# Patient Record
Sex: Male | Born: 1945 | Race: Black or African American | Hispanic: No | Smoking: Former smoker
Health system: Southern US, Community
[De-identification: ages and names within clinical notes are randomized; demographics above are authoritative.]

## PROBLEM LIST (undated history)

## (undated) DIAGNOSIS — R0602 Shortness of breath: Secondary | ICD-10-CM

## (undated) DIAGNOSIS — N200 Calculus of kidney: Secondary | ICD-10-CM

## (undated) DIAGNOSIS — M1990 Unspecified osteoarthritis, unspecified site: Secondary | ICD-10-CM

## (undated) DIAGNOSIS — R7302 Impaired glucose tolerance (oral): Secondary | ICD-10-CM

## (undated) DIAGNOSIS — Z9981 Dependence on supplemental oxygen: Secondary | ICD-10-CM

## (undated) DIAGNOSIS — I071 Rheumatic tricuspid insufficiency: Secondary | ICD-10-CM

## (undated) DIAGNOSIS — I272 Pulmonary hypertension, unspecified: Secondary | ICD-10-CM

## (undated) DIAGNOSIS — R609 Edema, unspecified: Secondary | ICD-10-CM

## (undated) DIAGNOSIS — E785 Hyperlipidemia, unspecified: Secondary | ICD-10-CM

## (undated) DIAGNOSIS — R200 Anesthesia of skin: Secondary | ICD-10-CM

## (undated) DIAGNOSIS — I10 Essential (primary) hypertension: Secondary | ICD-10-CM

## (undated) DIAGNOSIS — J9621 Acute and chronic respiratory failure with hypoxia: Secondary | ICD-10-CM

## (undated) DIAGNOSIS — E876 Hypokalemia: Secondary | ICD-10-CM

## (undated) DIAGNOSIS — J8410 Pulmonary fibrosis, unspecified: Secondary | ICD-10-CM

## (undated) DIAGNOSIS — R600 Localized edema: Secondary | ICD-10-CM

## (undated) DIAGNOSIS — N184 Chronic kidney disease, stage 4 (severe): Secondary | ICD-10-CM

## (undated) DIAGNOSIS — G4700 Insomnia, unspecified: Secondary | ICD-10-CM

## (undated) DIAGNOSIS — W3400XA Accidental discharge from unspecified firearms or gun, initial encounter: Secondary | ICD-10-CM

## (undated) DIAGNOSIS — J449 Chronic obstructive pulmonary disease, unspecified: Secondary | ICD-10-CM

## (undated) DIAGNOSIS — I509 Heart failure, unspecified: Secondary | ICD-10-CM

## (undated) DIAGNOSIS — I872 Venous insufficiency (chronic) (peripheral): Secondary | ICD-10-CM

## (undated) DIAGNOSIS — R011 Cardiac murmur, unspecified: Secondary | ICD-10-CM

## (undated) DIAGNOSIS — C3490 Malignant neoplasm of unspecified part of unspecified bronchus or lung: Secondary | ICD-10-CM

## (undated) HISTORY — PX: CYSTOSCOPY: SUR368

## (undated) HISTORY — PX: COLONOSCOPY W/ BIOPSIES AND POLYPECTOMY: SHX1376

## (undated) HISTORY — DX: Accidental discharge from unspecified firearms or gun, initial encounter: W34.00XA

## (undated) HISTORY — DX: Impaired glucose tolerance (oral): R73.02

## (undated) HISTORY — DX: Hyperlipidemia, unspecified: E78.5

## (undated) HISTORY — PX: INGUINAL HERNIA REPAIR: SUR1180

## (undated) HISTORY — DX: Venous insufficiency (chronic) (peripheral): I87.2

---

## 1968-09-30 HISTORY — PX: LEG SURGERY: SHX1003

## 1998-06-07 ENCOUNTER — Encounter (HOSPITAL_BASED_OUTPATIENT_CLINIC_OR_DEPARTMENT_OTHER): Payer: Self-pay | Admitting: General Surgery

## 1998-06-09 ENCOUNTER — Ambulatory Visit (HOSPITAL_COMMUNITY): Admission: RE | Admit: 1998-06-09 | Discharge: 1998-06-09 | Payer: Self-pay | Admitting: General Surgery

## 2001-03-17 ENCOUNTER — Emergency Department (HOSPITAL_COMMUNITY): Admission: EM | Admit: 2001-03-17 | Discharge: 2001-03-17 | Payer: Self-pay | Admitting: Emergency Medicine

## 2001-03-17 ENCOUNTER — Encounter: Payer: Self-pay | Admitting: Emergency Medicine

## 2001-03-27 ENCOUNTER — Ambulatory Visit (HOSPITAL_COMMUNITY): Admission: RE | Admit: 2001-03-27 | Discharge: 2001-03-27 | Payer: Self-pay | Admitting: Cardiology

## 2001-03-28 ENCOUNTER — Encounter: Payer: Self-pay | Admitting: Cardiology

## 2005-02-20 ENCOUNTER — Emergency Department (HOSPITAL_COMMUNITY): Admission: EM | Admit: 2005-02-20 | Discharge: 2005-02-20 | Payer: Self-pay | Admitting: Emergency Medicine

## 2005-02-22 ENCOUNTER — Ambulatory Visit (HOSPITAL_COMMUNITY): Admission: RE | Admit: 2005-02-22 | Discharge: 2005-02-22 | Payer: Self-pay | Admitting: Urology

## 2005-09-10 ENCOUNTER — Emergency Department (HOSPITAL_COMMUNITY): Admission: EM | Admit: 2005-09-10 | Discharge: 2005-09-10 | Payer: Self-pay | Admitting: Emergency Medicine

## 2006-04-23 ENCOUNTER — Encounter (HOSPITAL_COMMUNITY): Admission: RE | Admit: 2006-04-23 | Discharge: 2006-04-24 | Payer: Self-pay | Admitting: Cardiology

## 2006-04-26 ENCOUNTER — Encounter (INDEPENDENT_AMBULATORY_CARE_PROVIDER_SITE_OTHER): Payer: Self-pay | Admitting: *Deleted

## 2006-04-26 ENCOUNTER — Ambulatory Visit (HOSPITAL_COMMUNITY): Admission: RE | Admit: 2006-04-26 | Discharge: 2006-04-26 | Payer: Self-pay | Admitting: Gastroenterology

## 2006-05-08 ENCOUNTER — Inpatient Hospital Stay (HOSPITAL_BASED_OUTPATIENT_CLINIC_OR_DEPARTMENT_OTHER): Admission: RE | Admit: 2006-05-08 | Discharge: 2006-05-08 | Payer: Self-pay | Admitting: Cardiology

## 2007-04-14 ENCOUNTER — Emergency Department (HOSPITAL_COMMUNITY): Admission: EM | Admit: 2007-04-14 | Discharge: 2007-04-14 | Payer: Self-pay | Admitting: Emergency Medicine

## 2007-07-22 ENCOUNTER — Emergency Department (HOSPITAL_COMMUNITY): Admission: EM | Admit: 2007-07-22 | Discharge: 2007-07-22 | Payer: Self-pay | Admitting: Emergency Medicine

## 2008-02-05 ENCOUNTER — Encounter (INDEPENDENT_AMBULATORY_CARE_PROVIDER_SITE_OTHER): Payer: Self-pay | Admitting: Cardiology

## 2008-02-05 ENCOUNTER — Ambulatory Visit: Payer: Self-pay | Admitting: Vascular Surgery

## 2008-02-05 ENCOUNTER — Ambulatory Visit: Admission: RE | Admit: 2008-02-05 | Discharge: 2008-02-05 | Payer: Self-pay | Admitting: Cardiology
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
## 2010-06-17 NOTE — Op Note (Signed)
NAME:  [name], [name]               ACCOUNT NO.:  [id number]   MEDICAL RECORD NO.:  [id number]          PATIENT TYPE:  AMB   LOCATION:  DAY                          FACILITY:  [facility]   PHYSICIAN:  Mark C. Vernie Ammons, M.D.  DATE OF BIRTH:  29-Dec-1945   DATE OF PROCEDURE:  02/22/2005  DATE OF DISCHARGE:                                 OPERATIVE REPORT   PREOPERATIVE DIAGNOSIS:  Left ureteral calculus.   POSTOPERATIVE DIAGNOSIS:  Left ureteral calculus.   PROCEDURE:  Cystoscopy, left retrograde pyelogram with interpretation, left  ureteroscopy with laser lithotripsy and stone extraction and double-J stent  placement.   SURGEON:  Mark C. Vernie Ammons, M.D.   ANESTHESIA:  General.   SPECIMENS:  Stone to patient.   BLOOD LOSS:  Minimal.   DRAINS:  6-French 26 cm double-J stent in the left ureter with string.   COMPLICATIONS:  None.   INDICATIONS:  The patient is a 65 year old male with who has had left  ureteral colic and found to have a 7-mm stone in his left distal ureter. He  was having continued pain and I discussed the treatment options. He has  elected to proceed with ureteroscopic extraction of the stone and  understands the risks, complications and alternatives.   DESCRIPTION OF OPERATION:  After informed consent, the patient brought to  the major OR, placed on the table, administered general anesthesia and then  moved to the dorsal lithotomy position. His genitalia was sterilely prepped  and draped. Initially a 21-French cystoscope with 12 degrees lens was  inserted in the bladder. The urethra was visualized and noted to be normal,  the sphincter was intact, the prostatic urethra revealed no lesions and is  nonobstructing. The bladder itself was noted to be free of any tumor, stones  or inflammatory lesions. Ureteral orifices normal configuration and  position.   A 0.038 inch floppy-tip guidewire was then passed up the left ureter and  encountered the stone so I did not try  to advance that the guidewire any  further. I then passed a ureteral dilating balloon over the guidewire and  dilated the ureter to 10 atmospheres for approximately 30 seconds. I then  removed the dilating balloon and the guidewire as well as the cystoscope.   The 6-French rigid ureteroscope was then passed into the left ureteral  orifice without difficulty. I was able to pass it up to an area just below  where the stone was located and there seemed to be a small amount of edema  in that location. I was able to visualize the stone just beyond this. I  therefore performed a retrograde pyelogram.   Retrograde pyelogram was performed by injecting full strength contrast  through the ureteroscope into the ureter under direct fluoroscopy. I noted  proximal hydronephrosis just above the location of the stone which was  easily visualized as a filling defect. There was some distal narrowing of  the ureter likely due to some edema. There was hydronephrosis but no other  abnormalities were seen. The intrarenal collecting system appeared normal.   A 200 micron laser fiber was  then passed through the ureteroscope and it  then was able to fragment the stone into approximately three pieces. These  were then grasped with a nitinol basket and extracted. I then passed the  guidewire back up through the ureteroscope into the renal pelvis removing  the ureteroscope and back loading the cystoscope. I passed the stent with  good curl being noted in the renal pelvis when the guidewire was removed as  well as in the bladder. The bladder was then drained and the cystoscope  removed. The string that is attached to the distal aspect of the stent was  then affixed to the dorsum of the penis with tape and the patient was  awakened and taken to the recovery room in stable satisfactory condition. He  tolerated the procedure well with no intraoperative locations.   He is going to follow-up in my office the first of  next week to get his  stent out. I wrote him a prescription for 200 mg Pyridium for dysuria and  had given him a prescription previously for pain medication.      Mark C. Vernie Ammons, M.D.  Electronically Signed     MCO/MEDQ  D:  02/22/2005  T:  02/22/2005  Job:  [id number]
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
## 2010-06-27 ENCOUNTER — Emergency Department (HOSPITAL_COMMUNITY): Payer: Self-pay

## 2010-06-27 ENCOUNTER — Inpatient Hospital Stay (HOSPITAL_COMMUNITY): Payer: Self-pay

## 2010-06-27 ENCOUNTER — Inpatient Hospital Stay (HOSPITAL_COMMUNITY)
Admission: EM | Admit: 2010-06-27 | Discharge: 2010-07-05 | DRG: 291 | Disposition: A | Discharge status: Home / Self Care | Payer: Self-pay | Attending: Cardiology | Admitting: Cardiology

## 2010-06-27 DIAGNOSIS — N289 Disorder of kidney and ureter, unspecified: Secondary | ICD-10-CM | POA: Diagnosis not present

## 2010-06-27 DIAGNOSIS — I509 Heart failure, unspecified: Secondary | ICD-10-CM | POA: Diagnosis present

## 2010-06-27 DIAGNOSIS — I2510 Atherosclerotic heart disease of native coronary artery without angina pectoris: Secondary | ICD-10-CM | POA: Diagnosis present

## 2010-06-27 DIAGNOSIS — I5033 Acute on chronic diastolic (congestive) heart failure: Principal | ICD-10-CM | POA: Diagnosis present

## 2010-06-27 DIAGNOSIS — Z87891 Personal history of nicotine dependence: Secondary | ICD-10-CM

## 2010-06-27 DIAGNOSIS — J189 Pneumonia, unspecified organism: Secondary | ICD-10-CM | POA: Diagnosis present

## 2010-06-27 DIAGNOSIS — Z7982 Long term (current) use of aspirin: Secondary | ICD-10-CM

## 2010-06-27 DIAGNOSIS — I10 Essential (primary) hypertension: Secondary | ICD-10-CM | POA: Diagnosis present

## 2010-06-27 DIAGNOSIS — Z79899 Other long term (current) drug therapy: Secondary | ICD-10-CM

## 2010-06-27 DIAGNOSIS — E876 Hypokalemia: Secondary | ICD-10-CM | POA: Diagnosis not present

## 2010-06-27 DIAGNOSIS — I359 Nonrheumatic aortic valve disorder, unspecified: Secondary | ICD-10-CM | POA: Diagnosis present

## 2010-06-27 DIAGNOSIS — E7800 Pure hypercholesterolemia, unspecified: Secondary | ICD-10-CM | POA: Diagnosis present

## 2010-06-27 HISTORY — DX: Essential (primary) hypertension: I10

## 2010-06-27 LAB — CK TOTAL AND CKMB (NOT AT ARMC): Total CK: 87 U/L (ref 7–232)

## 2010-06-27 LAB — URINALYSIS, ROUTINE W REFLEX MICROSCOPIC
Glucose, UA: NEGATIVE mg/dL
Hgb urine dipstick: NEGATIVE
Protein, ur: 30 mg/dL — AB

## 2010-06-27 LAB — DIFFERENTIAL
Basophils Absolute: 0 10*3/uL (ref 0.0–0.1)
Basophils Relative: 0 % (ref 0–1)
Eosinophils Absolute: 0 10*3/uL (ref 0.0–0.7)
Eosinophils Relative: 0 % (ref 0–5)
Neutrophils Relative %: 71 % (ref 43–77)

## 2010-06-27 LAB — COMPREHENSIVE METABOLIC PANEL
Alkaline Phosphatase: 61 U/L (ref 39–117)
BUN: 19 mg/dL (ref 6–23)
Chloride: 98 mEq/L (ref 96–112)
Glucose, Bld: 111 mg/dL — ABNORMAL HIGH (ref 70–99)
Potassium: 3.1 mEq/L — ABNORMAL LOW (ref 3.5–5.1)
Total Bilirubin: 1.1 mg/dL (ref 0.3–1.2)

## 2010-06-27 LAB — POCT I-STAT, CHEM 8
Calcium, Ion: 1.07 mmol/L — ABNORMAL LOW (ref 1.12–1.32)
Glucose, Bld: 110 mg/dL — ABNORMAL HIGH (ref 70–99)
HCT: 44 % (ref 39.0–52.0)
Hemoglobin: 15 g/dL (ref 13.0–17.0)
TCO2: 26 mmol/L (ref 0–100)

## 2010-06-27 LAB — URINE MICROSCOPIC-ADD ON

## 2010-06-27 LAB — PROTIME-INR: Prothrombin Time: 15 seconds (ref 11.6–15.2)

## 2010-06-27 LAB — HEMOGLOBIN A1C: Hgb A1c MFr Bld: 5.9 % — ABNORMAL HIGH (ref <5.7)

## 2010-06-27 LAB — CBC
MCV: 102 fL — ABNORMAL HIGH (ref 78.0–100.0)
Platelets: 198 10*3/uL (ref 150–400)
RDW: 15.9 % — ABNORMAL HIGH (ref 11.5–15.5)
WBC: 7.3 10*3/uL (ref 4.0–10.5)

## 2010-06-27 LAB — PRO B NATRIURETIC PEPTIDE: Pro B Natriuretic peptide (BNP): 3876 pg/mL — ABNORMAL HIGH (ref 0–125)

## 2010-06-27 LAB — APTT: aPTT: 31 seconds (ref 24–37)

## 2010-06-28 ENCOUNTER — Inpatient Hospital Stay (HOSPITAL_COMMUNITY): Payer: Self-pay

## 2010-06-28 DIAGNOSIS — M7989 Other specified soft tissue disorders: Secondary | ICD-10-CM

## 2010-06-28 LAB — CBC
HCT: 40 % (ref 39.0–52.0)
Hemoglobin: 13.2 g/dL (ref 13.0–17.0)
MCH: 33.9 pg (ref 26.0–34.0)
MCHC: 33 g/dL (ref 30.0–36.0)

## 2010-06-28 LAB — LIPID PANEL
Cholesterol: 137 mg/dL (ref 0–200)
HDL: 31 mg/dL — ABNORMAL LOW (ref >39)
LDL Cholesterol: 86 mg/dL (ref 0–99)
Triglycerides: 100 mg/dL (ref <150)

## 2010-06-28 LAB — BASIC METABOLIC PANEL
CO2: 25 mEq/L (ref 19–32)
Calcium: 8.5 mg/dL (ref 8.4–10.5)
Creatinine, Ser: 1.3 mg/dL (ref 0.4–1.5)
GFR calc Af Amer: >60 mL/min (ref >60)
Glucose, Bld: 124 mg/dL — ABNORMAL HIGH (ref 70–99)

## 2010-06-28 LAB — HEPARIN LEVEL (UNFRACTIONATED)
Heparin Unfractionated: 0.1 IU/mL — ABNORMAL LOW (ref 0.30–0.70)
Heparin Unfractionated: 0.14 IU/mL — ABNORMAL LOW (ref 0.30–0.70)
Heparin Unfractionated: 0.21 IU/mL — ABNORMAL LOW (ref 0.30–0.70)

## 2010-06-28 LAB — CARDIAC PANEL(CRET KIN+CKTOT+MB+TROPI)
Relative Index: INVALID (ref 0.0–2.5)
Troponin I: <0.3 ng/mL (ref <0.30)

## 2010-06-28 MED ORDER — TECHNETIUM TO 99M ALBUMIN AGGREGATED
6.0000 | Freq: Once | INTRAVENOUS | Status: AC | PRN
  Administered 2010-06-28: 6 via INTRAVENOUS

## 2010-06-29 LAB — CBC
HCT: 41.5 % (ref 39.0–52.0)
Hemoglobin: 13.3 g/dL (ref 13.0–17.0)
RDW: 16 % — ABNORMAL HIGH (ref 11.5–15.5)
WBC: 5.1 10*3/uL (ref 4.0–10.5)

## 2010-06-29 LAB — BASIC METABOLIC PANEL
Chloride: 103 mEq/L (ref 96–112)
GFR calc Af Amer: >60 mL/min (ref >60)
Potassium: 3.7 mEq/L (ref 3.5–5.1)

## 2010-06-29 LAB — HEPARIN LEVEL (UNFRACTIONATED)
Heparin Unfractionated: 0.26 IU/mL — ABNORMAL LOW (ref 0.30–0.70)
Heparin Unfractionated: 0.32 IU/mL (ref 0.30–0.70)

## 2010-06-29 LAB — GLUCOSE, CAPILLARY: Glucose-Capillary: 118 mg/dL — ABNORMAL HIGH (ref 70–99)

## 2010-06-30 LAB — GLUCOSE, CAPILLARY
Glucose-Capillary: 96 mg/dL (ref 70–99)
Glucose-Capillary: 112 mg/dL — ABNORMAL HIGH (ref 70–99)
Glucose-Capillary: 114 mg/dL — ABNORMAL HIGH (ref 70–99)

## 2010-06-30 LAB — CBC
MCHC: 32.6 g/dL (ref 30.0–36.0)
MCV: 103.7 fL — ABNORMAL HIGH (ref 78.0–100.0)
Platelets: 236 10*3/uL (ref 150–400)
RDW: 15.5 % (ref 11.5–15.5)
WBC: 4.2 10*3/uL (ref 4.0–10.5)

## 2010-07-01 ENCOUNTER — Inpatient Hospital Stay (HOSPITAL_COMMUNITY): Payer: Self-pay

## 2010-07-01 ENCOUNTER — Encounter (HOSPITAL_COMMUNITY): Payer: Self-pay | Admitting: Radiology

## 2010-07-01 LAB — BASIC METABOLIC PANEL
Calcium: 8.6 mg/dL (ref 8.4–10.5)
Creatinine, Ser: 1.01 mg/dL (ref 0.4–1.5)
GFR calc Af Amer: >60 mL/min (ref >60)

## 2010-07-01 LAB — GLUCOSE, CAPILLARY
Glucose-Capillary: 109 mg/dL — ABNORMAL HIGH (ref 70–99)
Glucose-Capillary: 105 mg/dL — ABNORMAL HIGH (ref 70–99)
Glucose-Capillary: 141 mg/dL — ABNORMAL HIGH (ref 70–99)

## 2010-07-01 LAB — CBC
MCH: 33.1 pg (ref 26.0–34.0)
MCHC: 31.4 g/dL (ref 30.0–36.0)
Platelets: 257 10*3/uL (ref 150–400)

## 2010-07-01 MED ORDER — IOHEXOL 300 MG/ML  SOLN: 175.0000 mL | Freq: Once | INTRAMUSCULAR | Status: AC | PRN

## 2010-07-02 LAB — GLUCOSE, CAPILLARY
Glucose-Capillary: 105 mg/dL — ABNORMAL HIGH (ref 70–99)
Glucose-Capillary: 101 mg/dL — ABNORMAL HIGH (ref 70–99)

## 2010-07-02 LAB — CBC
HCT: 40.7 % (ref 39.0–52.0)
MCV: 103.6 fL — ABNORMAL HIGH (ref 78.0–100.0)
Platelets: 276 10*3/uL (ref 150–400)
RBC: 3.93 MIL/uL — ABNORMAL LOW (ref 4.22–5.81)
WBC: 4.5 10*3/uL (ref 4.0–10.5)

## 2010-07-02 LAB — BASIC METABOLIC PANEL
BUN: 14 mg/dL (ref 6–23)
Chloride: 103 mEq/L (ref 96–112)
Potassium: 3.8 mEq/L (ref 3.5–5.1)
Sodium: 139 mEq/L (ref 135–145)

## 2010-07-03 ENCOUNTER — Inpatient Hospital Stay (HOSPITAL_COMMUNITY): Payer: Self-pay

## 2010-07-03 LAB — BASIC METABOLIC PANEL
BUN: 14 mg/dL (ref 6–23)
CO2: 30 mEq/L (ref 19–32)
Calcium: 8.9 mg/dL (ref 8.4–10.5)
Chloride: 100 mEq/L (ref 96–112)
Creatinine, Ser: 0.96 mg/dL (ref 0.4–1.5)
GFR calc Af Amer: >60 mL/min (ref >60)
Glucose, Bld: 117 mg/dL — ABNORMAL HIGH (ref 70–99)

## 2010-07-03 LAB — GLUCOSE, CAPILLARY: Glucose-Capillary: 109 mg/dL — ABNORMAL HIGH (ref 70–99)

## 2010-07-03 LAB — CBC
HCT: 41.6 % (ref 39.0–52.0)
Hemoglobin: 13.4 g/dL (ref 13.0–17.0)
MCH: 33.3 pg (ref 26.0–34.0)
MCHC: 32.2 g/dL (ref 30.0–36.0)
MCV: 103.2 fL — ABNORMAL HIGH (ref 78.0–100.0)
RDW: 14.8 % (ref 11.5–15.5)

## 2010-07-04 LAB — GLUCOSE, CAPILLARY
Glucose-Capillary: 118 mg/dL — ABNORMAL HIGH (ref 70–99)
Glucose-Capillary: 109 mg/dL — ABNORMAL HIGH (ref 70–99)

## 2010-07-04 LAB — BASIC METABOLIC PANEL
BUN: 16 mg/dL (ref 6–23)
Calcium: 9.3 mg/dL (ref 8.4–10.5)
Chloride: 100 mEq/L (ref 96–112)
Creatinine, Ser: 0.93 mg/dL (ref 0.4–1.5)

## 2010-07-04 LAB — CBC
MCH: 32.5 pg (ref 26.0–34.0)
MCHC: 31.6 g/dL (ref 30.0–36.0)
MCV: 102.9 fL — ABNORMAL HIGH (ref 78.0–100.0)
Platelets: 355 10*3/uL (ref 150–400)
RBC: 4.15 MIL/uL — ABNORMAL LOW (ref 4.22–5.81)
RDW: 14.7 % (ref 11.5–15.5)

## 2010-07-05 ENCOUNTER — Inpatient Hospital Stay (HOSPITAL_COMMUNITY): Payer: Self-pay

## 2010-07-05 LAB — CBC
HCT: 41.5 % (ref 39.0–52.0)
Hemoglobin: 13.6 g/dL (ref 13.0–17.0)
MCH: 33.7 pg (ref 26.0–34.0)
RBC: 4.04 MIL/uL — ABNORMAL LOW (ref 4.22–5.81)

## 2010-07-05 LAB — GLUCOSE, CAPILLARY: Glucose-Capillary: 96 mg/dL (ref 70–99)

## 2010-07-12 ENCOUNTER — Ambulatory Visit
Admission: RE | Admit: 2010-07-12 | Discharge: 2010-07-12 | Disposition: A | Discharge status: Home / Self Care | Payer: No Typology Code available for payment source | Source: Ambulatory Visit | Attending: Cardiology | Admitting: Cardiology

## 2010-07-12 ENCOUNTER — Other Ambulatory Visit: Payer: Self-pay | Admitting: Cardiology

## 2010-07-12 DIAGNOSIS — I509 Heart failure, unspecified: Secondary | ICD-10-CM
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
## 2010-10-24 LAB — INFLUENZA A+B VIRUS AG-DIRECT(RAPID): Inflenza A Ag: NEGATIVE

## 2010-10-27 LAB — COMPREHENSIVE METABOLIC PANEL
Albumin: 3.8
BUN: 10
Creatinine, Ser: 1.33
Total Bilirubin: 1.2
Total Protein: 8.4 — ABNORMAL HIGH

## 2010-10-27 LAB — DIFFERENTIAL
Basophils Absolute: 0
Lymphocytes Relative: 13
Monocytes Absolute: 0.5
Monocytes Relative: 7
Neutro Abs: 5.5

## 2010-10-27 LAB — CBC
HCT: 44.8
MCV: 93
Platelets: 238
RDW: 14.4

## 2010-10-27 LAB — URINALYSIS, ROUTINE W REFLEX MICROSCOPIC
Bilirubin Urine: NEGATIVE
Protein, ur: NEGATIVE
Urobilinogen, UA: 0.2

## 2011-01-04 ENCOUNTER — Other Ambulatory Visit: Payer: Self-pay | Admitting: Cardiology

## 2011-02-08 ENCOUNTER — Emergency Department (HOSPITAL_COMMUNITY)
Admission: EM | Admit: 2011-02-08 | Discharge: 2011-02-08 | Disposition: A | Discharge status: Home / Self Care | Payer: Medicare Other | Attending: Emergency Medicine | Admitting: Emergency Medicine

## 2011-02-08 DIAGNOSIS — L909 Atrophic disorder of skin, unspecified: Secondary | ICD-10-CM | POA: Insufficient documentation

## 2011-02-08 DIAGNOSIS — Z79899 Other long term (current) drug therapy: Secondary | ICD-10-CM | POA: Insufficient documentation

## 2011-02-08 DIAGNOSIS — N509 Disorder of male genital organs, unspecified: Secondary | ICD-10-CM | POA: Insufficient documentation

## 2011-02-08 DIAGNOSIS — R58 Hemorrhage, not elsewhere classified: Secondary | ICD-10-CM | POA: Insufficient documentation

## 2011-02-08 DIAGNOSIS — I10 Essential (primary) hypertension: Secondary | ICD-10-CM | POA: Insufficient documentation

## 2011-02-08 NOTE — ED Provider Notes (Signed)
History    CSN: [id number] rrival date & time 02/08/11  1015  First MD Initiated Contact with Patient 02/08/11 1118   Chief complaint,bleeding HPI Patient states he had gone on a walk this morning and was in the shower. Patient states she was washing been following that he started noticing some bleeding from the scrotum. Patient denies any pain. Patient could not really see with bleeding was coming from. He does know that he was not having any blood in his urine. He also notes he was not having any  rectal bleeding. Patient came in to be evaluated. Since that time the bleeding has stopped. Patient denies any abdominal pain. He denies any coughing or shortness of breath. He denies any fever. Past Medical History  Diagnosis Date  . Hypertension     No past surgical history on file.  No family history on file.  History  Substance Use Topics  . Smoking status: Not on file  . Smokeless tobacco: Not on file  . Alcohol Use: Not on file      Review of Systems  All other systems reviewed and are negative.    Allergies  Review of patient's allergies indicates no known allergies.  Home Medications   Current Outpatient Rx  Name Route Sig Dispense Refill  . ASPIRIN EC 81 MG PO TBEC Oral Take 81 mg by mouth daily.    Marland Kitchen CARVEDILOL 3.125 MG PO TABS Oral Take 6.25 mg by mouth daily with breakfast.      BP 139/80  Pulse 73  Temp(Src) 98.3 F (36.8 C) (Oral)  Resp 16  SpO2 96%  Physical Exam  Nursing note and vitals reviewed. Constitutional: He appears well-developed and well-nourished. No distress.  HENT:  Head: Normocephalic and atraumatic.  Right Ear: External ear normal.  Left Ear: External ear normal.  Eyes: Conjunctivae are normal. Right eye exhibits no discharge. Left eye exhibits no discharge. No scleral icterus.  Neck: Neck supple. No tracheal deviation present.  Cardiovascular: Normal rate, regular rhythm and intact distal pulses.   Pulmonary/Chest: Effort normal and  breath sounds normal. No stridor. No respiratory distress.  Abdominal: Soft. Bowel sounds are normal. He exhibits no distension.  Genitourinary: Testes normal and penis normal. Cremasteric reflex is present. Right testis shows no mass, no swelling and no tenderness. Left testis shows no mass, no swelling and no tenderness. No penile erythema or penile tenderness. No discharge found.       Few small skin tags noted on the scrotum, dried blood noted in a few the scrotal folds, no active bleeding, no lesions  Musculoskeletal: He exhibits no edema and no tenderness.  Neurological: He is alert. He has normal strength. No sensory deficit. Cranial nerve deficit:  no gross defecits noted. He exhibits normal muscle tone. He displays no seizure activity. Coordination normal.  Skin: Skin is warm and dry. No rash noted.  Psychiatric: He has a normal mood and affect.    ED Course  Procedures (including critical care time)  Labs Reviewed - No data to display No results found.     MDM  I do not see any obvious signs of bleeding at this time. Patient denies any blood from his urine. Suspect he might have had a skin tag that for off in his shower as there were several noted on the scrotum. There may been a small pinpoint area where one of these tags may have torn off of are not certain. Regardless, at this time there is no evidence  of bleeding. Recommend the patient monitor the situation if he has any recurrent bleeding, blood in his urine, blood in his stool, fever he should return for evaluation         Celene Kras, MD 02/08/11 1133

## 2011-02-08 NOTE — ED Notes (Signed)
States after returning from am walk and while in shower, pt noticed bleeding from testicles. No pain. States bleeding would not stop. States he has had boils 'that busted' do the same. Appears in nad.

## 2011-02-10 ENCOUNTER — Encounter (HOSPITAL_COMMUNITY): Payer: Self-pay

## 2011-03-06 ENCOUNTER — Other Ambulatory Visit: Payer: Self-pay | Admitting: Cardiology

## 2011-11-02 ENCOUNTER — Other Ambulatory Visit: Payer: Self-pay | Admitting: Cardiology

## 2011-11-15 ENCOUNTER — Inpatient Hospital Stay (HOSPITAL_COMMUNITY)
Admission: EM | Admit: 2011-11-15 | Discharge: 2011-11-27 | DRG: 167 | Disposition: A | Discharge status: Home / Self Care | Payer: Medicare Other | Attending: Cardiology | Admitting: Cardiology

## 2011-11-15 ENCOUNTER — Emergency Department (HOSPITAL_COMMUNITY): Payer: Medicare Other

## 2011-11-15 ENCOUNTER — Encounter (HOSPITAL_COMMUNITY): Payer: Self-pay | Admitting: Family Medicine

## 2011-11-15 DIAGNOSIS — E785 Hyperlipidemia, unspecified: Secondary | ICD-10-CM | POA: Diagnosis present

## 2011-11-15 DIAGNOSIS — Z801 Family history of malignant neoplasm of trachea, bronchus and lung: Secondary | ICD-10-CM

## 2011-11-15 DIAGNOSIS — I5032 Chronic diastolic (congestive) heart failure: Secondary | ICD-10-CM | POA: Diagnosis present

## 2011-11-15 DIAGNOSIS — R0902 Hypoxemia: Secondary | ICD-10-CM

## 2011-11-15 DIAGNOSIS — E7800 Pure hypercholesterolemia, unspecified: Secondary | ICD-10-CM | POA: Diagnosis present

## 2011-11-15 DIAGNOSIS — J4489 Other specified chronic obstructive pulmonary disease: Secondary | ICD-10-CM | POA: Diagnosis present

## 2011-11-15 DIAGNOSIS — Z79899 Other long term (current) drug therapy: Secondary | ICD-10-CM

## 2011-11-15 DIAGNOSIS — I10 Essential (primary) hypertension: Secondary | ICD-10-CM | POA: Diagnosis present

## 2011-11-15 DIAGNOSIS — I872 Venous insufficiency (chronic) (peripheral): Secondary | ICD-10-CM | POA: Diagnosis present

## 2011-11-15 DIAGNOSIS — R918 Other nonspecific abnormal finding of lung field: Secondary | ICD-10-CM | POA: Diagnosis present

## 2011-11-15 DIAGNOSIS — C3430 Malignant neoplasm of lower lobe, unspecified bronchus or lung: Secondary | ICD-10-CM | POA: Diagnosis present

## 2011-11-15 DIAGNOSIS — J449 Chronic obstructive pulmonary disease, unspecified: Secondary | ICD-10-CM | POA: Diagnosis present

## 2011-11-15 DIAGNOSIS — E739 Lactose intolerance, unspecified: Secondary | ICD-10-CM | POA: Diagnosis present

## 2011-11-15 DIAGNOSIS — Z6841 Body Mass Index (BMI) 40.0 and over, adult: Secondary | ICD-10-CM

## 2011-11-15 DIAGNOSIS — Z7982 Long term (current) use of aspirin: Secondary | ICD-10-CM

## 2011-11-15 DIAGNOSIS — J189 Pneumonia, unspecified organism: Principal | ICD-10-CM | POA: Diagnosis present

## 2011-11-15 DIAGNOSIS — Z87891 Personal history of nicotine dependence: Secondary | ICD-10-CM

## 2011-11-15 LAB — CBC WITH DIFFERENTIAL/PLATELET
Basophils Absolute: 0 10*3/uL (ref 0.0–0.1)
Basophils Absolute: 0 10*3/uL (ref 0.0–0.1)
Basophils Relative: 1 % (ref 0–1)
Basophils Relative: 0 % (ref 0–1)
Eosinophils Absolute: 0.2 10*3/uL (ref 0.0–0.7)
Eosinophils Absolute: 0.2 10*3/uL (ref 0.0–0.7)
Eosinophils Relative: 6 % — ABNORMAL HIGH (ref 0–5)
HCT: 39 % (ref 39.0–52.0)
MCH: 32.2 pg (ref 26.0–34.0)
MCH: 32.9 pg (ref 26.0–34.0)
MCHC: 34.2 g/dL (ref 30.0–36.0)
MCHC: 35.1 g/dL (ref 30.0–36.0)
MCV: 93.8 fL (ref 78.0–100.0)
Monocytes Absolute: 0.4 10*3/uL (ref 0.1–1.0)
Neutrophils Relative %: 45 % (ref 43–77)
Platelets: 198 10*3/uL (ref 150–400)
Platelets: 195 10*3/uL (ref 150–400)
RBC: 4.16 MIL/uL — ABNORMAL LOW (ref 4.22–5.81)
RDW: 13.2 % (ref 11.5–15.5)

## 2011-11-15 LAB — COMPREHENSIVE METABOLIC PANEL
AST: 23 U/L (ref 0–37)
Albumin: 3.6 g/dL (ref 3.5–5.2)
Alkaline Phosphatase: 61 U/L (ref 39–117)
BUN: 13 mg/dL (ref 6–23)
CO2: 25 mEq/L (ref 19–32)
Chloride: 103 mEq/L (ref 96–112)
GFR calc non Af Amer: 89 mL/min — ABNORMAL LOW (ref >90)
Potassium: 4 mEq/L (ref 3.5–5.1)
Total Bilirubin: 0.3 mg/dL (ref 0.3–1.2)

## 2011-11-15 LAB — BASIC METABOLIC PANEL
GFR calc Af Amer: >90 mL/min (ref >90)
GFR calc non Af Amer: 88 mL/min — ABNORMAL LOW (ref >90)
Potassium: 4.3 mEq/L (ref 3.5–5.1)
Sodium: 137 mEq/L (ref 135–145)

## 2011-11-15 MED ORDER — DEXTROSE 5 % IV SOLN
500.0000 mg | INTRAVENOUS | Status: DC
  Administered 2011-11-16: 500 mg via INTRAVENOUS
  Filled 2011-11-15 (×3): qty 500

## 2011-11-15 MED ORDER — SODIUM CHLORIDE 0.9 % IV SOLN
Freq: Once | INTRAVENOUS | Status: AC
  Administered 2011-11-15: 14:00:00 via INTRAVENOUS

## 2011-11-15 MED ORDER — HEPARIN SODIUM (PORCINE) 5000 UNIT/ML IJ SOLN
5000.0000 [IU] | Freq: Three times a day (TID) | INTRAMUSCULAR | Status: DC
  Administered 2011-11-15 – 2011-11-20 (×14): 5000 [IU] via SUBCUTANEOUS
  Filled 2011-11-15 (×18): qty 1

## 2011-11-15 MED ORDER — POTASSIUM CHLORIDE CRYS ER 20 MEQ PO TBCR
20.0000 meq | EXTENDED_RELEASE_TABLET | Freq: Every day | ORAL | Status: DC
  Administered 2011-11-15 – 2011-11-21 (×7): 20 meq via ORAL
  Filled 2011-11-15 (×8): qty 1

## 2011-11-15 MED ORDER — FUROSEMIDE 10 MG/ML IJ SOLN
40.0000 mg | Freq: Every day | INTRAMUSCULAR | Status: DC
  Administered 2011-11-15 – 2011-11-21 (×7): 40 mg via INTRAVENOUS
  Filled 2011-11-15 (×8): qty 4

## 2011-11-15 MED ORDER — ASPIRIN EC 81 MG PO TBEC
81.0000 mg | DELAYED_RELEASE_TABLET | Freq: Every day | ORAL | Status: DC
  Administered 2011-11-15 – 2011-11-19 (×5): 81 mg via ORAL
  Filled 2011-11-15 (×7): qty 1

## 2011-11-15 MED ORDER — AZITHROMYCIN 500 MG IV SOLR
500.0000 mg | Freq: Once | INTRAVENOUS | Status: AC
  Administered 2011-11-15: 500 mg via INTRAVENOUS
  Filled 2011-11-15: qty 500

## 2011-11-15 MED ORDER — SODIUM CHLORIDE 0.9 % IV SOLN
INTRAVENOUS | Status: DC
  Administered 2011-11-15: 1000 mL via INTRAVENOUS

## 2011-11-15 MED ORDER — DEXTROSE 5 % IV SOLN
1.0000 g | Freq: Once | INTRAVENOUS | Status: AC
  Administered 2011-11-15: 1 g via INTRAVENOUS
  Filled 2011-11-15: qty 10

## 2011-11-15 MED ORDER — ALBUTEROL SULFATE (5 MG/ML) 0.5% IN NEBU
5.0000 mg | INHALATION_SOLUTION | Freq: Once | RESPIRATORY_TRACT | Status: AC
  Administered 2011-11-15: 5 mg via RESPIRATORY_TRACT
  Filled 2011-11-15: qty 1

## 2011-11-15 MED ORDER — CARVEDILOL 6.25 MG PO TABS
6.2500 mg | ORAL_TABLET | Freq: Every day | ORAL | Status: DC
  Administered 2011-11-16 – 2011-11-27 (×11): 6.25 mg via ORAL
  Filled 2011-11-15 (×14): qty 1

## 2011-11-15 MED ORDER — DEXTROSE 5 % IV SOLN
1.0000 g | INTRAVENOUS | Status: DC
  Administered 2011-11-16 – 2011-11-18 (×3): 1 g via INTRAVENOUS
  Filled 2011-11-15 (×4): qty 10

## 2011-11-15 MED ORDER — PANTOPRAZOLE SODIUM 40 MG PO TBEC
40.0000 mg | DELAYED_RELEASE_TABLET | Freq: Every day | ORAL | Status: DC
  Administered 2011-11-16 – 2011-11-27 (×11): 40 mg via ORAL
  Filled 2011-11-15 (×13): qty 1

## 2011-11-15 MED ORDER — LEVALBUTEROL HCL 1.25 MG/0.5ML IN NEBU
1.2500 mg | INHALATION_SOLUTION | Freq: Three times a day (TID) | RESPIRATORY_TRACT | Status: DC
  Administered 2011-11-15: 1.25 mg via RESPIRATORY_TRACT
  Filled 2011-11-15 (×5): qty 0.5

## 2011-11-15 NOTE — ED Provider Notes (Signed)
Medical screening examination/treatment/procedure(s) were conducted as a shared visit with non-physician practitioner(s) and myself.  I personally evaluated the patient during the encounter  Pt has failed outpatient antibiotics, he is hypoxic when ambulating.  Plan is for admission.  Starting IV zithromax and rocephin  Ethelda Chick, MD 11/15/11 1606

## 2011-11-15 NOTE — ED Provider Notes (Signed)
History     CSN: [id number]  Arrival date & time 11/15/11  1008   First MD Initiated Contact with Patient 11/15/11 1102      Chief Complaint  Patient presents with  . Shortness of Breath  . Wheezing    (Consider location/radiation/quality/duration/timing/severity/associated sxs/prior treatment) The history is provided by the patient and medical records.    [name] is a 66 y.o. male presents to the emergency department complaining of shortness of breath.  The onset of the symptoms was  gradual starting 2 weeks ago.  The patient has associated wheezing, cough.  Patient saw his primary care physician 2 weeks ago was put on antibiotics at that time.  Patient does not know which antibiotics he was put on.  Patient states he feels like he is not getting any better.  He saw his primary care physician again today and they were concerned therefore they sent him here. The symptoms have been  persistent, gradually worsened.  nothing makes the symptoms worse and nothing makes symptoms better.  The patient denies fever, chills, headache, chest pain, pain, nausea, vomiting.     Past Medical History  Diagnosis Date  . Hypertension     History reviewed. No pertinent past surgical history.  History reviewed. No pertinent family history.  History  Substance Use Topics  . Smoking status: Not on file  . Smokeless tobacco: Not on file  . Alcohol Use: No      Review of Systems  Constitutional: Negative for fever, diaphoresis, appetite change, fatigue and unexpected weight change.  HENT: Negative for mouth sores and neck stiffness.   Eyes: Negative for visual disturbance.  Respiratory: Positive for cough and wheezing. Negative for chest tightness and shortness of breath.   Cardiovascular: Negative for chest pain.  Gastrointestinal: Negative for nausea, vomiting, abdominal pain, diarrhea and constipation.  Genitourinary: Negative for dysuria, urgency, frequency and hematuria.  Skin:  Negative for rash.  Neurological: Negative for syncope, light-headedness and headaches.  Hematological: Does not bruise/bleed easily.  Psychiatric/Behavioral: Negative for disturbed wake/sleep cycle. The patient is not nervous/anxious.   All other systems reviewed and are negative.    Allergies  Review of patient's allergies indicates no known allergies.  Home Medications   Current Outpatient Rx  Name Route Sig Dispense Refill  . ASPIRIN EC 81 MG PO TBEC Oral Take 81 mg by mouth daily.    Marland Kitchen CARVEDILOL 3.125 MG PO TABS Oral Take 6.25 mg by mouth daily with breakfast.    . RAMIPRIL 5 MG PO TABS Oral Take 5 mg by mouth daily.      BP 162/74  Pulse 49  Temp 98.3 F (36.8 C) (Oral)  Resp 14  SpO2 98%  Physical Exam  Nursing note and vitals reviewed. Constitutional: He is oriented to person, place, and time. He appears well-developed and well-nourished. No distress.  HENT:  Head: Normocephalic and atraumatic.  Mouth/Throat: Oropharynx is clear and moist. No oropharyngeal exudate.  Eyes: Conjunctivae normal are normal. Pupils are equal, round, and reactive to light. No scleral icterus.  Neck: Normal range of motion. Neck supple.  Cardiovascular: Regular rhythm, normal heart sounds and intact distal pulses.  Exam reveals no gallop and no friction rub.   No murmur heard. Pulmonary/Chest: Effort normal. No respiratory distress. He has decreased breath sounds (throughout). He has wheezes (mild, throughout). He has rhonchi in the left middle field and the left lower field.  Abdominal: Soft. Bowel sounds are normal. He exhibits no mass. There  is no tenderness. There is no rebound and no guarding.  Musculoskeletal: Normal range of motion. He exhibits no edema.  Neurological: He is alert and oriented to person, place, and time.       Speech is clear and goal oriented Moves extremities without ataxia  Skin: Skin is warm and dry. No rash noted. He is not diaphoretic. No erythema.    Psychiatric: He has a normal mood and affect.    ED Course  Procedures (including critical care time)  Labs Reviewed  CBC WITH DIFFERENTIAL - Abnormal; Notable for the following:    WBC 3.2 (*)     RBC 4.16 (*)     Neutro Abs 1.4 (*)     All other components within normal limits  BASIC METABOLIC PANEL - Abnormal; Notable for the following:    GFR calc non Af Amer 88 (*)     All other components within normal limits  CULTURE, BLOOD (ROUTINE X 2)  CULTURE, BLOOD (ROUTINE X 2)   Dg Chest 2 View  11/15/2011  *RADIOLOGY REPORT*  Clinical Data: Productive cough, wheezing  CHEST - 2 VIEW  Comparison: 07/12/2010  Findings: Cardiomegaly again noted.  There is again noted consolidation with some air bronchogram in the left lower lobe posteriorly. Most likely represents recurrent pneumonia.  Follow-up to resolution after treatment is recommended and to exclude a neoplastic process.  IMPRESSION: There is again noted consolidation with some air bronchogram in the left lower lobe posteriorly. Most likely represents recurrent pneumonia.  Follow-up to resolution after treatment is recommended and to exclude a neoplastic process.   Original Report Authenticated By: Natasha Mead, M.D.      1. Community acquired pneumonia   2. Hypoxia       MDM  [name] presents for further evaluation of his shortness of breath. Left lower lobe pneumonia noted.  Patient is not hypoxic at rest however increasing shortness of breath with exertion.  Patient has failed outpatient treatment.  No evidence of sepsis or septic shock.  Will initiate IV antibiotics here in emergency department. We'll proceed with admission.  PCP: Dr Rinaldo Cloud.  I spoke to Dr Sharyn Lull who will assess and admit the patient.  He specifically asked for blood cultures which have been ordered.    Dr. Jerelyn Scott was consulted, evaluated this patient with me and agrees with the plan.          Dahlia Client Maykel Reitter, PA-C 11/15/11  1553

## 2011-11-15 NOTE — ED Notes (Signed)
Per pt sts 2 weeks of wheezing and SOB. sts was put on abx but not getting better. sts went to see his doctor and told to come here.

## 2011-11-15 NOTE — ED Notes (Signed)
Pt ambulated to room from waiting area with steady gait, no signs of distress or SOB
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
## 2011-11-15 NOTE — ED Notes (Signed)
TO [phone]

## 2011-11-16 LAB — LEGIONELLA ANTIGEN, URINE

## 2011-11-16 LAB — HIV ANTIBODY (ROUTINE TESTING W REFLEX): HIV: NONREACTIVE

## 2011-11-16 LAB — STREP PNEUMONIAE URINARY ANTIGEN: Strep Pneumo Urinary Antigen: NEGATIVE

## 2011-11-16 MED ORDER — PNEUMOCOCCAL VAC POLYVALENT 25 MCG/0.5ML IJ INJ
0.5000 mL | INJECTION | INTRAMUSCULAR | Status: AC
  Administered 2011-11-17: 0.5 mL via INTRAMUSCULAR
  Filled 2011-11-16 (×4): qty 0.5

## 2011-11-16 MED ORDER — LEVALBUTEROL HCL 1.25 MG/0.5ML IN NEBU
1.2500 mg | INHALATION_SOLUTION | Freq: Four times a day (QID) | RESPIRATORY_TRACT | Status: DC | PRN
  Administered 2011-11-18: 1.25 mg via RESPIRATORY_TRACT
  Filled 2011-11-16: qty 0.5

## 2011-11-16 NOTE — Progress Notes (Signed)
Chart review complete.  Patient is not eligible for [facility] Care Management services because his/her PCP is not a [facility] primary care provider or is not [facility] affiliated.  For any additional questions or new referrals please contact Tim Henderson BSN RN [facility] Liaison at [phone] °

## 2011-11-16 NOTE — Progress Notes (Signed)
Charted wrong patient info
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
## 2011-11-17 MED ORDER — AZITHROMYCIN 500 MG PO TABS
500.0000 mg | ORAL_TABLET | Freq: Every day | ORAL | Status: DC
  Administered 2011-11-17 – 2011-11-18 (×2): 500 mg via ORAL
  Filled 2011-11-17 (×4): qty 1

## 2011-11-17 NOTE — Progress Notes (Signed)
PHARMACIST - PHYSICIAN COMMUNICATION DR:   Sharyn Lull CONCERNING: Antibiotic IV to Oral Route Change Policy  RECOMMENDATION: This patient is receiving Azithromycin by the intravenous route.  Based on criteria approved by the Pharmacy and Therapeutics Committee, the antibiotic(s) is/are being converted to the equivalent oral dose form(s).  DESCRIPTION: These criteria include:  Patient being treated for a respiratory tract infection, urinary tract infection, or cellulitis  The patient is not neutropenic and does not exhibit a GI malabsorption state  The patient is eating (either orally or via tube) and/or has been taking other orally administered medications for a least 24 hours  The patient is improving clinically and has a Tmax < 100.5  If you have questions about this conversion, please contact the Pharmacy Department  []   [phone] )  [facility] [x]   [phone] )  [facility]  []   [phone] )  [facility] []   [phone] )  [facility]   Georgina Pillion, PharmD, BCPS 11/17/2011 3:09 PM
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
## 2011-11-18 ENCOUNTER — Inpatient Hospital Stay (HOSPITAL_COMMUNITY): Payer: Medicare Other

## 2011-11-18 ENCOUNTER — Encounter (HOSPITAL_COMMUNITY): Payer: Self-pay | Admitting: General Practice

## 2011-11-18 ENCOUNTER — Other Ambulatory Visit (HOSPITAL_COMMUNITY): Payer: Medicare Other

## 2011-11-18 LAB — BASIC METABOLIC PANEL
Chloride: 102 mEq/L (ref 96–112)
GFR calc non Af Amer: 68 mL/min — ABNORMAL LOW (ref >90)
Glucose, Bld: 97 mg/dL (ref 70–99)
Potassium: 4.3 mEq/L (ref 3.5–5.1)
Sodium: 137 mEq/L (ref 135–145)

## 2011-11-18 LAB — CBC
Hemoglobin: 13.4 g/dL (ref 13.0–17.0)
MCHC: 34.1 g/dL (ref 30.0–36.0)
RBC: 4.19 MIL/uL — ABNORMAL LOW (ref 4.22–5.81)
WBC: 3.3 10*3/uL — ABNORMAL LOW (ref 4.0–10.5)
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
## 2011-11-19 DIAGNOSIS — R918 Other nonspecific abnormal finding of lung field: Secondary | ICD-10-CM

## 2011-11-19 MED ORDER — PIPERACILLIN-TAZOBACTAM 3.375 G IVPB
3.3750 g | Freq: Three times a day (TID) | INTRAVENOUS | Status: DC
  Administered 2011-11-19 – 2011-11-27 (×24): 3.375 g via INTRAVENOUS
  Filled 2011-11-19 (×26): qty 50

## 2011-11-19 MED ORDER — VANCOMYCIN HCL 1000 MG IV SOLR
1500.0000 mg | Freq: Two times a day (BID) | INTRAVENOUS | Status: DC
  Administered 2011-11-19 – 2011-11-24 (×10): 1500 mg via INTRAVENOUS
  Filled 2011-11-19 (×12): qty 1500

## 2011-11-19 NOTE — Consult Note (Signed)
Dictation #: [id number]

## 2011-11-19 NOTE — Progress Notes (Signed)
ANTIBIOTIC CONSULT NOTE - INITIAL  Pharmacy Consult for vancomycin + zosyn Indication: rule out pneumonia  No Known Allergies  Patient Measurements: Height: 5\' 11"  (180.3 cm) Weight: 319 lb 10.7 oz (145 kg) IBW/kg (Calculated) : 75.3   Vital Signs: Temp: 97.8 F (36.6 C) (10/20 0939) Temp src: Oral (10/20 0939) BP: 117/63 mmHg (10/20 0939) Pulse Rate: 63  (10/20 0939) Intake/Output from previous day: 10/19 0701 - 10/20 0700 In: 600 [P.O.:600] Out: 1 [Stool:1] Intake/Output from this shift: Total I/O In: 240 [P.O.:240] Out: -   Labs:  Basename 11/18/11 0548  WBC 3.3*  HGB 13.4  PLT 194  LABCREA --  CREATININE 1.10   Estimated Creatinine Clearance: 96.4 ml/min (by C-G formula based on Cr of 1.1). No results found for this basename: VANCOTROUGH:2,VANCOPEAK:2,VANCORANDOM:2,GENTTROUGH:2,GENTPEAK:2,GENTRANDOM:2,TOBRATROUGH:2,TOBRAPEAK:2,TOBRARND:2,AMIKACINPEAK:2,AMIKACINTROU:2,AMIKACIN:2, in the last 72 hours   Microbiology: Recent Results (from the past 720 hour(s))  CULTURE, BLOOD (ROUTINE X 2)     Status: Normal (Preliminary result)   Collection Time   11/15/11  4:06 PM      Component Value Range Status Comment   Specimen Description BLOOD ARM LEFT   Final    Special Requests BOTTLES DRAWN AEROBIC AND ANAEROBIC 10CC   Final    Culture  Setup Time 11/15/2011 22:42   Final    Culture     Final    Value:        BLOOD CULTURE RECEIVED NO GROWTH TO DATE CULTURE WILL BE HELD FOR 5 DAYS BEFORE ISSUING A FINAL NEGATIVE REPORT   Report Status PENDING   Incomplete   CULTURE, BLOOD (ROUTINE X 2)     Status: Normal (Preliminary result)   Collection Time   11/15/11  4:09 PM      Component Value Range Status Comment   Specimen Description BLOOD HAND LEFT   Final    Special Requests BOTTLES DRAWN AEROBIC ONLY 2CC   Final    Culture  Setup Time 11/15/2011 22:43   Final    Culture     Final    Value:        BLOOD CULTURE RECEIVED NO GROWTH TO DATE CULTURE WILL BE HELD FOR 5  DAYS BEFORE ISSUING A FINAL NEGATIVE REPORT   Report Status PENDING   Incomplete     Medical History: Past Medical History  Diagnosis Date  . Hypertension     Medications:  Anti-infectives     Start     Dose/Rate Route Frequency Ordered Stop   11/19/11 1200   vancomycin (VANCOCIN) 1,500 mg in sodium chloride 0.9 % 500 mL IVPB        1,500 mg 250 mL/hr over 120 Minutes Intravenous Every 12 hours 11/19/11 1129     11/19/11 1200  piperacillin-tazobactam (ZOSYN) IVPB 3.375 g       3.375 g 12.5 mL/hr over 240 Minutes Intravenous Every 8 hours 11/19/11 1129     11/17/11 1700   azithromycin (ZITHROMAX) tablet 500 mg  Status:  Discontinued        500 mg Oral Daily 11/17/11 1511 11/19/11 1115   11/15/11 2000   azithromycin (ZITHROMAX) 500 mg in dextrose 5 % 250 mL IVPB  Status:  Discontinued        500 mg 250 mL/hr over 60 Minutes Intravenous Every 24 hours 11/15/11 1900 11/17/11 1510   11/15/11 1815   cefTRIAXone (ROCEPHIN) 1 g in dextrose 5 % 50 mL IVPB  Status:  Discontinued        1 g 100 mL/hr  over 30 Minutes Intravenous Every 24 hours 11/15/11 1814 11/19/11 1116   11/15/11 1500   azithromycin (ZITHROMAX) 500 mg in dextrose 5 % 250 mL IVPB        500 mg 250 mL/hr over 60 Minutes Intravenous  Once 11/15/11 1459 11/15/11 1729   11/15/11 1500   cefTRIAXone (ROCEPHIN) 1 g in dextrose 5 % 50 mL IVPB        1 g 100 mL/hr over 30 Minutes Intravenous  Once 11/15/11 1459 11/15/11 1554         Assessment: 66 yom recently treated with avelox PTA for possible bronchitis admitted on 10/16 with SOB, cough and wheezing. Initially placed on ceftriaxone + azithromycin. However, CT of the chest reported worsening consolidation with concern for possible mass. Will broaden coverage to empiric vancomycin + zosyn for now. Pt is afebrile and WBC is low at 3.3.  Azithro 10/16>>10/20 CTX 10/16>>10/20 Vanc 10/20>> Zosyn 10/20>>  10/16 Blood - NGTD  Goal of Therapy:  Vancomycin trough level  15-20 mcg/ml  Plan:  1. Zosyn 3.375gm IV Q8H (4 hr infusion) 2. Vancomycin 1500mg  IV Q12H 3. F/u renal fxn, C&S, clinical status and trough at [facility]  Lamar Naef, Drake Leach 11/19/2011,11:29 AM
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
## 2011-11-20 LAB — EXPECTORATED SPUTUM ASSESSMENT W GRAM STAIN, RFLX TO RESP C

## 2011-11-20 MED ORDER — PHENYLEPHRINE HCL 0.25 % NA SOLN
1.0000 | Freq: Four times a day (QID) | NASAL | Status: DC | PRN
  Filled 2011-11-20: qty 15

## 2011-11-20 MED ORDER — SODIUM CHLORIDE 0.9 % IV SOLN: Freq: Once | INTRAVENOUS | Status: DC

## 2011-11-20 MED ORDER — BUTAMBEN-TETRACAINE-BENZOCAINE 2-2-14 % EX AERO
1.0000 | INHALATION_SPRAY | Freq: Once | CUTANEOUS | Status: DC
  Filled 2011-11-20: qty 56

## 2011-11-20 MED ORDER — LIDOCAINE HCL 2 % EX GEL
Freq: Once | CUTANEOUS | Status: DC
  Filled 2011-11-20: qty 5
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
## 2011-11-21 ENCOUNTER — Encounter (HOSPITAL_COMMUNITY)
Admission: EM | Disposition: A | Discharge status: Home / Self Care | Payer: Self-pay | Source: Home / Self Care | Attending: Cardiology

## 2011-11-21 ENCOUNTER — Inpatient Hospital Stay (HOSPITAL_COMMUNITY): Payer: Medicare Other

## 2011-11-21 HISTORY — PX: VIDEO BRONCHOSCOPY: SHX5072

## 2011-11-21 LAB — BASIC METABOLIC PANEL
BUN: 22 mg/dL (ref 6–23)
Calcium: 9 mg/dL (ref 8.4–10.5)
Chloride: 105 mEq/L (ref 96–112)
Creatinine, Ser: 1.16 mg/dL (ref 0.50–1.35)
GFR calc Af Amer: 74 mL/min — ABNORMAL LOW (ref >90)

## 2011-11-21 LAB — CULTURE, BLOOD (ROUTINE X 2)

## 2011-11-21 LAB — BODY FLUID CELL COUNT WITH DIFFERENTIAL
Lymphs, Fluid: 10 %
Total Nucleated Cell Count, Fluid: 2107 cu mm — ABNORMAL HIGH (ref 0–1000)

## 2011-11-21 SURGERY — BRONCHOSCOPY, WITH FLUOROSCOPY
Anesthesia: Moderate Sedation | Laterality: Bilateral

## 2011-11-21 MED ORDER — PHENYLEPHRINE HCL 0.25 % NA SOLN
NASAL | Status: DC | PRN
  Administered 2011-11-21: 2 via NASAL

## 2011-11-21 MED ORDER — POTASSIUM CHLORIDE CRYS ER 20 MEQ PO TBCR
20.0000 meq | EXTENDED_RELEASE_TABLET | Freq: Two times a day (BID) | ORAL | Status: DC
  Administered 2011-11-21 – 2011-11-27 (×12): 20 meq via ORAL
  Filled 2011-11-21 (×13): qty 1

## 2011-11-21 MED ORDER — MEPERIDINE HCL 25 MG/ML IJ SOLN
INTRAMUSCULAR | Status: DC | PRN
  Administered 2011-11-21: 50 mg via INTRAVENOUS

## 2011-11-21 MED ORDER — LIDOCAINE HCL 2 % EX GEL
CUTANEOUS | Status: DC | PRN
  Administered 2011-11-21: 1

## 2011-11-21 MED ORDER — MIDAZOLAM HCL 10 MG/2ML IJ SOLN
INTRAMUSCULAR | Status: DC | PRN
  Administered 2011-11-21: 5 mg via INTRAVENOUS

## 2011-11-21 MED ORDER — FUROSEMIDE 10 MG/ML IJ SOLN
40.0000 mg | Freq: Two times a day (BID) | INTRAMUSCULAR | Status: DC
  Administered 2011-11-21 – 2011-11-27 (×12): 40 mg via INTRAVENOUS
  Filled 2011-11-21 (×14): qty 4

## 2011-11-21 MED ORDER — MEPERIDINE HCL 100 MG/ML IJ SOLN
INTRAMUSCULAR | Status: AC
  Filled 2011-11-21: qty 2

## 2011-11-21 MED ORDER — MIDAZOLAM HCL 5 MG/ML IJ SOLN
INTRAMUSCULAR | Status: AC
  Filled 2011-11-21: qty 4

## 2011-11-21 NOTE — Progress Notes (Signed)
Pt did well with bronchoscopy.  Ok for discharge in am from my standpoint, and I will arrange followup in office to review path.  Would send home on levaquin 750mg  for a total abx course of 10 days.  Will have to decide if he needs oxygen at home prior to discharge.  Check sats at rest and with exertion, and he has to be 88% or less on room air to qualify.
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
## 2011-11-21 NOTE — Op Note (Signed)
NAMECAPRICE, WASKO NO.:  [id number]  MEDICAL RECORD NO.:  [id number]  LOCATION:  6742                         FACILITY:  [facility]  PHYSICIAN:  Barbaraann Share, MD,FCCPDATE OF BIRTH:  [birth year]  DATE OF PROCEDURE:  11/21/2011 DATE OF DISCHARGE:                              OPERATIVE REPORT   PROCEDURE:  Flexible fiberoptic bronchoscopy with video and biopsies. Indication is left lower lobe pulmonary infiltrate/mass of unknown origin.  OPERATOR:  Barbaraann Share, MD,FCCP  ANESTHESIA:  Versed 5 mg IV, Demerol 50 mg IV, and topical 1% lidocaine in vocal cords and airways during the procedure.  DESCRIPTION:  After obtaining informed consent and under close cardiopulmonary monitoring, the above preop anesthesia was given and the fiberoptic scope was passed to the right naris and into posterior pharynx.  There were no lesions or other abnormalities seen.  The vocal cords appeared to be within normal limits and moved bilaterally on phonation.  The scope was then passed into the trachea where it was examined along its entire length down to the level of the carina, all of which was normal.  The right tracheobronchial tree was examined serially.  The subsegmental level with no endobronchial abnormality being found.  Attention was then paid to the left tracheobronchial tree where the left upper lobe appeared to be normal.  Various airways in the left lower lobe were mildly edematous, but there was no endobronchial lesion seen.  Bronchoalveolar lavage was then done from the superior segment of the left lower lobe as well as the most medial and posterior basilar segment of the left lower lobe as well.  Under fluoroscopic guidance, bronchial brushes and transbronchial biopsies were done from those same areas with good material being obtained.  The patient maintained stability throughout the procedure, and there was good hemostasis throughout.  Chest x-ray post procedure  showed no pneumothorax post biopsy.     Barbaraann Share, MD,FCCP     [facility]  D:  11/21/2011  T:  11/21/2011  Job:  [id number]
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
## 2011-11-21 NOTE — Progress Notes (Signed)
Video bronchoscopy procedure performed. Bronchial washings intervention performed. Brushings intervention performed. Biopsy intervention performed.

## 2011-11-21 NOTE — Op Note (Signed)
Dictation #: [id number]

## 2011-11-22 ENCOUNTER — Encounter (HOSPITAL_COMMUNITY): Payer: Self-pay | Admitting: Pulmonary Disease

## 2011-11-22 LAB — CBC
HCT: 36.9 % — ABNORMAL LOW (ref 39.0–52.0)
Hemoglobin: 12.5 g/dL — ABNORMAL LOW (ref 13.0–17.0)
RBC: 3.9 MIL/uL — ABNORMAL LOW (ref 4.22–5.81)
RDW: 13.2 % (ref 11.5–15.5)
WBC: 4.3 10*3/uL (ref 4.0–10.5)

## 2011-11-22 LAB — CULTURE, RESPIRATORY W GRAM STAIN: Gram Stain: NONE SEEN

## 2011-11-22 LAB — BASIC METABOLIC PANEL
BUN: 23 mg/dL (ref 6–23)
Chloride: 101 mEq/L (ref 96–112)
GFR calc Af Amer: 72 mL/min — ABNORMAL LOW (ref >90)
Potassium: 3.9 mEq/L (ref 3.5–5.1)

## 2011-11-22 LAB — PATHOLOGIST SMEAR REVIEW: Path Review: REACTIVE
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
## 2011-11-22 NOTE — Progress Notes (Signed)
ANTIBIOTIC CONSULT NOTE - FOLLOW UP  Pharmacy Consult for Vancomycin and Zosyn Indication: rule out pneumonia  No Known Allergies  Patient Measurements: Height: 5\' 11"  (180.3 cm) Weight: 319 lb 10.7 oz (145 kg) IBW/kg (Calculated) : 75.3  Adjusted Body Weight:   Vital Signs: Temp: 97.2 F (36.2 C) (10/23 1354) Temp src: Oral (10/23 1354) BP: 143/75 mmHg (10/23 1354) Pulse Rate: 63  (10/23 1354) Intake/Output from previous day: 10/22 0701 - 10/23 0700 In: 1350 [P.O.:720; I.V.:80; IV Piggyback:550] Out: 1550 [Urine:1550] Intake/Output from this shift: Total I/O In: 840 [P.O.:840] Out: 400 [Urine:400]  Labs:  [facility] 11/22/11 0530 11/21/11 0515  WBC 4.3 --  HGB 12.5* --  PLT 200 --  LABCREA -- --  CREATININE 1.19 1.16   Estimated Creatinine Clearance: 89.1 ml/min (by C-G formula based on Cr of 1.19). No results found for this basename: VANCOTROUGH:2,VANCOPEAK:2,VANCORANDOM:2,GENTTROUGH:2,GENTPEAK:2,GENTRANDOM:2,TOBRATROUGH:2,TOBRAPEAK:2,TOBRARND:2,AMIKACINPEAK:2,AMIKACINTROU:2,AMIKACIN:2, in the last 72 hours   Microbiology: Recent Results (from the past 720 hour(s))  CULTURE, BLOOD (ROUTINE X 2)     Status: Normal   Collection Time   11/15/11  4:06 PM      Component Value Range Status Comment   Specimen Description BLOOD ARM LEFT   Final    Special Requests BOTTLES DRAWN AEROBIC AND ANAEROBIC 10CC   Final    Culture  Setup Time 11/15/2011 22:42   Final    Culture NO GROWTH 5 DAYS   Final    Report Status 11/21/2011 FINAL   Final   CULTURE, BLOOD (ROUTINE X 2)     Status: Normal   Collection Time   11/15/11  4:09 PM      Component Value Range Status Comment   Specimen Description BLOOD HAND LEFT   Final    Special Requests BOTTLES DRAWN AEROBIC ONLY 2CC   Final    Culture  Setup Time 11/15/2011 22:43   Final    Culture NO GROWTH 5 DAYS   Final    Report Status 11/21/2011 FINAL   Final   CULTURE, EXPECTORATED SPUTUM-ASSESSMENT     Status: Normal   Collection  Time   11/20/11  6:15 AM      Component Value Range Status Comment   Specimen Description SPUTUM   Final    Special Requests NONE   Final    Sputum evaluation     Final    Value: THIS SPECIMEN IS ACCEPTABLE. RESPIRATORY CULTURE REPORT TO FOLLOW.   Report Status 11/20/2011 FINAL   Final   CULTURE, RESPIRATORY     Status: Normal   Collection Time   11/20/11  6:15 AM      Component Value Range Status Comment   Specimen Description SPUTUM   Final    Special Requests NONE   Final    Gram Stain     Final    Value: NO WBC SEEN     FEW SQUAMOUS EPITHELIAL CELLS PRESENT     FEW GRAM POSITIVE COCCI IN CLUSTERS     IN PAIRS RARE GRAM NEGATIVE RODS   Culture NORMAL OROPHARYNGEAL FLORA   Final    Report Status 11/22/2011 FINAL   Final   CULTURE, BAL-QUANTITATIVE     Status: Normal (Preliminary result)   Collection Time   11/21/11  8:51 AM      Component Value Range Status Comment   Specimen Description BRONCHIAL ALVEOLAR LAVAGE   Final    Special Requests NONE   Final    Gram Stain     Final  Value: NO WBC SEEN     FEW SQUAMOUS EPITHELIAL CELLS PRESENT     NO ORGANISMS SEEN   Colony Count PENDING   Incomplete    Culture NO GROWTH 1 DAY   Final    Report Status PENDING   Incomplete   FUNGUS CULTURE W SMEAR     Status: Normal (Preliminary result)   Collection Time   11/21/11  8:51 AM      Component Value Range Status Comment   Specimen Description BRONCHIAL ALVEOLAR LAVAGE   Final    Special Requests NONE   Final    Fungal Smear NO YEAST OR FUNGAL ELEMENTS SEEN   Final    Culture CULTURE IN PROGRESS FOR FOUR WEEKS   Final    Report Status PENDING   Incomplete     Anti-infectives     Start     Dose/Rate Route Frequency Ordered Stop   11/19/11 1200   vancomycin (VANCOCIN) 1,500 mg in sodium chloride 0.9 % 500 mL IVPB        1,500 mg 250 mL/hr over 120 Minutes Intravenous Every 12 hours 11/19/11 1129     11/19/11 1200  piperacillin-tazobactam (ZOSYN) IVPB 3.375 g       3.375  g 12.5 mL/hr over 240 Minutes Intravenous Every 8 hours 11/19/11 1129     11/17/11 1700   azithromycin (ZITHROMAX) tablet 500 mg  Status:  Discontinued        500 mg Oral Daily 11/17/11 1511 11/19/11 1115   11/15/11 2000   azithromycin (ZITHROMAX) 500 mg in dextrose 5 % 250 mL IVPB  Status:  Discontinued        500 mg 250 mL/hr over 60 Minutes Intravenous Every 24 hours 11/15/11 1900 11/17/11 1510   11/15/11 1815   cefTRIAXone (ROCEPHIN) 1 g in dextrose 5 % 50 mL IVPB  Status:  Discontinued        1 g 100 mL/hr over 30 Minutes Intravenous Every 24 hours 11/15/11 1814 11/19/11 1116   11/15/11 1500   azithromycin (ZITHROMAX) 500 mg in dextrose 5 % 250 mL IVPB        500 mg 250 mL/hr over 60 Minutes Intravenous  Once 11/15/11 1459 11/15/11 1729   11/15/11 1500   cefTRIAXone (ROCEPHIN) 1 g in dextrose 5 % 50 mL IVPB        1 g 100 mL/hr over 30 Minutes Intravenous  Once 11/15/11 1459 11/15/11 1554          Assessment: 66yom on Vancomycin and Zosyn Day 4 for suspected PNA. Patient is afebrile and WBC 4.3. Patient had BAL on 10/22 - no growth reported. Noted Pulmonology recommendations (note on 10/22) to convert antibiotics to Levaquin 750mg  daily (dose appropriate) for total of 10 antibiotic days. Patient's renal function and UOP have remained fairly stable during admission.  Goal of Therapy:  Vancomycin trough level 15-20 mcg/ml  Plan:  1. Continue Vancomycin 1.5g IV q12h - will plan to check Vancomycin trough ~1200 tomorrow if Vancomycin is to continue 2. Continue Zosyn 3.375g IV q8h 3. Monitor renal function, cultures and clinical course 4. Follow-up antibiotic adjustments based on Pulmonology recommendations  Cleon Dew [phone] 11/22/2011,2:01 PM

## 2011-11-23 ENCOUNTER — Inpatient Hospital Stay (HOSPITAL_COMMUNITY): Payer: Medicare Other

## 2011-11-23 LAB — CULTURE, BAL-QUANTITATIVE W GRAM STAIN: Colony Count: NO GROWTH

## 2011-11-23 MED ORDER — CEFAZOLIN SODIUM-DEXTROSE 2-3 GM-% IV SOLR
INTRAVENOUS | Status: AC
  Filled 2011-11-23: qty 50
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
## 2011-11-24 ENCOUNTER — Other Ambulatory Visit: Payer: Self-pay | Admitting: Pulmonary Disease

## 2011-11-24 DIAGNOSIS — C3490 Malignant neoplasm of unspecified part of unspecified bronchus or lung: Secondary | ICD-10-CM | POA: Insufficient documentation

## 2011-11-24 MED ORDER — VANCOMYCIN HCL IN DEXTROSE 1-5 GM/200ML-% IV SOLN
1000.0000 mg | Freq: Two times a day (BID) | INTRAVENOUS | Status: DC
  Administered 2011-11-24 – 2011-11-27 (×6): 1000 mg via INTRAVENOUS
  Filled 2011-11-24 (×8): qty 200

## 2011-11-24 MED ORDER — VANCOMYCIN HCL IN DEXTROSE 1-5 GM/200ML-% IV SOLN: 1000.0000 mg | Freq: Two times a day (BID) | INTRAVENOUS | Status: DC
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
## 2011-11-24 NOTE — Progress Notes (Signed)
   CARE MANAGEMENT NOTE 11/24/2011  Patient:  [name], [name]   Account Number:  [id number]  Date Initiated:  11/24/2011  Documentation initiated by:  Shuntavia Yerby  Subjective/Objective Assessment:     Action/Plan:   Pt for d/c to home to self care, no HH needs identified.   Anticipated DC Date:  11/24/2011   Anticipated DC Plan:  HOME/SELF CARE         Choice offered to / List presented to:             Status of service:  Completed, signed off Medicare Important Message given?   (If response is "NO", the following Medicare IM given date fields will be blank) Date Medicare IM given:   Date Additional Medicare IM given:    Discharge Disposition:  HOME/SELF CARE  Per UR Regulation:    If discussed at Long Length of Stay Meetings, dates discussed:   11/21/2011  11/23/2011    Comments:

## 2011-11-24 NOTE — Progress Notes (Signed)
ANTIBIOTIC CONSULT NOTE - FOLLOW UP  Pharmacy Consult for Vancomycin + Zosyn Indication: rule out pneumonia  No Known Allergies  Patient Measurements: Height: 5\' 11"  (180.3 cm) Weight: 311 lb 12.8 oz (141.432 kg) IBW/kg (Calculated) : 75.3   Vital Signs: Temp: 97.3 F (36.3 C) (10/25 1320) Temp src: Oral (10/25 1320) BP: 122/57 mmHg (10/25 1320) Pulse Rate: 62  (10/25 1320) Intake/Output from previous day: 10/24 0701 - 10/25 0700 In: 3178 [P.O.:1220; I.V.:750; IV Piggyback:1208] Out: 2225 [Urine:2225] Intake/Output from this shift: Total I/O In: 360 [P.O.:360] Out: -   Labs:  Basename 11/22/11 0530  WBC 4.3  HGB 12.5*  PLT 200  LABCREA --  CREATININE 1.19   Estimated Creatinine Clearance: 87.8 ml/min (by C-G formula based on Cr of 1.19).  Basename 11/24/11 1513  VANCOTROUGH 21.1*  VANCOPEAK --  Drue Dun --  GENTTROUGH --  GENTPEAK --  GENTRANDOM --  TOBRATROUGH --  TOBRAPEAK --  TOBRARND --  AMIKACINPEAK --  AMIKACINTROU --  AMIKACIN --     Microbiology: Recent Results (from the past 720 hour(s))  CULTURE, BLOOD (ROUTINE X 2)     Status: Normal   Collection Time   11/15/11  4:06 PM      Component Value Range Status Comment   Specimen Description BLOOD ARM LEFT   Final    Special Requests BOTTLES DRAWN AEROBIC AND ANAEROBIC 10CC   Final    Culture  Setup Time 11/15/2011 22:42   Final    Culture NO GROWTH 5 DAYS   Final    Report Status 11/21/2011 FINAL   Final   CULTURE, BLOOD (ROUTINE X 2)     Status: Normal   Collection Time   11/15/11  4:09 PM      Component Value Range Status Comment   Specimen Description BLOOD HAND LEFT   Final    Special Requests BOTTLES DRAWN AEROBIC ONLY 2CC   Final    Culture  Setup Time 11/15/2011 22:43   Final    Culture NO GROWTH 5 DAYS   Final    Report Status 11/21/2011 FINAL   Final   CULTURE, EXPECTORATED SPUTUM-ASSESSMENT     Status: Normal   Collection Time   11/20/11  6:15 AM      Component Value Range  Status Comment   Specimen Description SPUTUM   Final    Special Requests NONE   Final    Sputum evaluation     Final    Value: THIS SPECIMEN IS ACCEPTABLE. RESPIRATORY CULTURE REPORT TO FOLLOW.   Report Status 11/20/2011 FINAL   Final   CULTURE, RESPIRATORY     Status: Normal   Collection Time   11/20/11  6:15 AM      Component Value Range Status Comment   Specimen Description SPUTUM   Final    Special Requests NONE   Final    Gram Stain     Final    Value: NO WBC SEEN     FEW SQUAMOUS EPITHELIAL CELLS PRESENT     FEW GRAM POSITIVE COCCI IN CLUSTERS     IN PAIRS RARE GRAM NEGATIVE RODS   Culture NORMAL OROPHARYNGEAL FLORA   Final    Report Status 11/22/2011 FINAL   Final   AFB CULTURE WITH SMEAR     Status: Normal (Preliminary result)   Collection Time   11/21/11  8:51 AM      Component Value Range Status Comment   Specimen Description BRONCHIAL ALVEOLAR LAVAGE   Final  Special Requests NONE   Final    ACID FAST SMEAR NO ACID FAST BACILLI SEEN   Final    Culture     Final    Value: CULTURE WILL BE EXAMINED FOR 6 WEEKS BEFORE ISSUING A FINAL REPORT   Report Status PENDING   Incomplete   CULTURE, BAL-QUANTITATIVE     Status: Normal   Collection Time   11/21/11  8:51 AM      Component Value Range Status Comment   Specimen Description BRONCHIAL ALVEOLAR LAVAGE   Final    Special Requests NONE   Final    Gram Stain     Final    Value: NO WBC SEEN     FEW SQUAMOUS EPITHELIAL CELLS PRESENT     NO ORGANISMS SEEN   Colony Count NO GROWTH   Final    Culture NO GROWTH 2 DAYS   Final    Report Status 11/23/2011 FINAL   Final   FUNGUS CULTURE W SMEAR     Status: Normal (Preliminary result)   Collection Time   11/21/11  8:51 AM      Component Value Range Status Comment   Specimen Description BRONCHIAL ALVEOLAR LAVAGE   Final    Special Requests NONE   Final    Fungal Smear NO YEAST OR FUNGAL ELEMENTS SEEN   Final    Culture CULTURE IN PROGRESS FOR FOUR WEEKS   Final    Report  Status PENDING   Incomplete     Anti-infectives     Start     Dose/Rate Route Frequency Ordered Stop   11/19/11 1200   vancomycin (VANCOCIN) 1,500 mg in sodium chloride 0.9 % 500 mL IVPB        1,500 mg 250 mL/hr over 120 Minutes Intravenous Every 12 hours 11/19/11 1129     11/19/11 1200  piperacillin-tazobactam (ZOSYN) IVPB 3.375 g       3.375 g 12.5 mL/hr over 240 Minutes Intravenous Every 8 hours 11/19/11 1129     11/17/11 1700   azithromycin (ZITHROMAX) tablet 500 mg  Status:  Discontinued        500 mg Oral Daily 11/17/11 1511 11/19/11 1115   11/15/11 2000   azithromycin (ZITHROMAX) 500 mg in dextrose 5 % 250 mL IVPB  Status:  Discontinued        500 mg 250 mL/hr over 60 Minutes Intravenous Every 24 hours 11/15/11 1900 11/17/11 1510   11/15/11 1815   cefTRIAXone (ROCEPHIN) 1 g in dextrose 5 % 50 mL IVPB  Status:  Discontinued        1 g 100 mL/hr over 30 Minutes Intravenous Every 24 hours 11/15/11 1814 11/19/11 1116   11/15/11 1500   azithromycin (ZITHROMAX) 500 mg in dextrose 5 % 250 mL IVPB        500 mg 250 mL/hr over 60 Minutes Intravenous  Once 11/15/11 1459 11/15/11 1729   11/15/11 1500   cefTRIAXone (ROCEPHIN) 1 g in dextrose 5 % 50 mL IVPB        1 g 100 mL/hr over 30 Minutes Intravenous  Once 11/15/11 1459 11/15/11 1554          Assessment: 66 y.o M on Vancomycin + Zosyn for empiric PNA coverage with a SUPRAtherapeutic Vancomycin level this evening drawn ~3 hrs after the true trough (measured level~21.1 mcg/ml, estimated true trough~25.5 mcg/ml). Renal function has been worsening this admission (SCr 0.84 on admit and SCr 1.19 today). Will continue Vancomycin at a lower  dose this evening.   Goal of Therapy:  Vancomycin trough level 15-20 mcg/ml  Plan:  1. Reduce Vancomycin to 1g IV every 12 hours 2. Continue Zosyn 3.375g IV every 8 hours 3. Will continue to follow renal function, culture results, LOT, and antibiotic de-escalation plans   Georgina Pillion,  PharmD, BCPS Clinical Pharmacist Pager: [phone] 11/24/2011 5:12 PM

## 2011-11-25 ENCOUNTER — Inpatient Hospital Stay (HOSPITAL_COMMUNITY): Payer: Medicare Other
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
## 2011-11-26 NOTE — Progress Notes (Signed)
ANTIBIOTIC CONSULT NOTE - FOLLOW UP  Pharmacy Consult for Vancomycin + Zosyn Indication: rule out pneumonia  No Known Allergies  Labs: No results found for this basename: WBC:3,HGB:3,PLT:3,LABCREA:3,CREATININE:3, in the last 72 hours Estimated Creatinine Clearance: 88.4 ml/min (by C-G formula based on Cr of 1.19).  Basename 11/24/11 1513  VANCOTROUGH 21.1*  VANCOPEAK --  Drue Dun --  GENTTROUGH --  GENTPEAK --  GENTRANDOM --  TOBRATROUGH --  TOBRAPEAK --  TOBRARND --  AMIKACINPEAK --  AMIKACINTROU --  AMIKACIN --     Assessment: 66 y.o M on Vancomycin + Zosyn for empiric PNA coverage   Day 8 of IV antibiotics  Goal of Therapy:  Vancomycin trough level 15-20 mcg/ml  Plan:  1. Continue Vancomycin and Zosyn at current doses 2. DC IV antibiotics or change to po? 3. Resume sq heparin pending cancer workup?  Thank you. Okey Regal, PharmD [phone]   11/26/2011 11:56 AM
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
## 2011-11-27 ENCOUNTER — Telehealth: Payer: Self-pay | Admitting: Emergency Medicine

## 2011-11-27 MED ORDER — LEVOFLOXACIN 750 MG PO TABS: 750.0000 mg | ORAL_TABLET | Freq: Every day | ORAL | Status: DC

## 2011-11-27 MED ORDER — FUROSEMIDE 80 MG PO TABS: 80.0000 mg | ORAL_TABLET | Freq: Two times a day (BID) | ORAL | Status: DC

## 2011-11-27 MED ORDER — POTASSIUM CHLORIDE CRYS ER 20 MEQ PO TBCR: 20.0000 meq | EXTENDED_RELEASE_TABLET | Freq: Two times a day (BID) | ORAL | Status: DC

## 2011-11-27 NOTE — Discharge Summary (Signed)
  Discharge summary dictated on 11/27/2011 dictation number is [phone]

## 2011-11-27 NOTE — Telephone Encounter (Signed)
Pt is calling to check on the status of PFT and PET scan. Dr. Shelle Iron placed the orders for this on Fri., 11/24/11. Pt aware PCC's will call him with apptr date and time fr these tests.

## 2011-11-27 NOTE — Telephone Encounter (Signed)
I called this morning to schedule PET scan and PFT's. Pt was still in the hospital this morning and was unable to schedule until patient had been discharged. I contacted Mr. Gaddie this evening at 5:28 pm and advised him of the above. I also advised the patient that I would call first thing in the am to schedule both of these tests and would call him in the morning to give him the appointment dates and times. Rhonda J Cobb
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
## 2011-11-28 NOTE — Telephone Encounter (Signed)
PET Scan scheduled for 1st available which is Monday 12/04/11 at 12:15 at [facility]. Spoke with [place] in Nuclear Medicine and he stated that he didn't have any work in appointments. PFT has been scheduled for Wed. 11/29/11 at 10:00 here at [facility] [address]. Called and spoke with patient and he is aware of both appointment dates, times, locations and instructions/directions for both tests. Pt voiced understanding and repeated dates, times, locations and instructions/directions back to me. Rhonda J Cobb

## 2011-11-29 ENCOUNTER — Ambulatory Visit (INDEPENDENT_AMBULATORY_CARE_PROVIDER_SITE_OTHER): Payer: Medicare Other | Admitting: Pulmonary Disease

## 2011-11-29 DIAGNOSIS — C3490 Malignant neoplasm of unspecified part of unspecified bronchus or lung: Secondary | ICD-10-CM

## 2011-11-29 LAB — PULMONARY FUNCTION TEST

## 2011-11-29 NOTE — Progress Notes (Signed)
PFT done today. 

## 2011-12-04 ENCOUNTER — Ambulatory Visit (HOSPITAL_COMMUNITY): Payer: Medicare Other

## 2011-12-06 ENCOUNTER — Encounter (HOSPITAL_COMMUNITY)
Admission: RE | Admit: 2011-12-06 | Discharge: 2011-12-06 | Disposition: A | Discharge status: Home / Self Care | Payer: Medicare Other | Source: Ambulatory Visit | Attending: Pulmonary Disease | Admitting: Pulmonary Disease

## 2011-12-06 ENCOUNTER — Other Ambulatory Visit: Payer: Self-pay | Admitting: Pulmonary Disease

## 2011-12-06 DIAGNOSIS — C3490 Malignant neoplasm of unspecified part of unspecified bronchus or lung: Secondary | ICD-10-CM

## 2011-12-06 MED ORDER — FLUDEOXYGLUCOSE F - 18 (FDG) INJECTION
15.3000 | Freq: Once | INTRAVENOUS | Status: AC | PRN
  Administered 2011-12-06: 15.3 via INTRAVENOUS

## 2011-12-12 ENCOUNTER — Encounter: Payer: Self-pay | Admitting: Surgery

## 2011-12-12 ENCOUNTER — Other Ambulatory Visit: Payer: Self-pay | Admitting: Surgery

## 2011-12-12 ENCOUNTER — Institutional Professional Consult (permissible substitution) (INDEPENDENT_AMBULATORY_CARE_PROVIDER_SITE_OTHER): Payer: Medicare Other | Admitting: Surgery

## 2011-12-12 VITALS — BP 157/85 | HR 65 | Resp 20 | Ht 71.0 in | Wt 300.0 lb

## 2011-12-12 DIAGNOSIS — D381 Neoplasm of uncertain behavior of trachea, bronchus and lung: Secondary | ICD-10-CM

## 2011-12-12 DIAGNOSIS — I10 Essential (primary) hypertension: Secondary | ICD-10-CM | POA: Insufficient documentation

## 2011-12-12 DIAGNOSIS — I872 Venous insufficiency (chronic) (peripheral): Secondary | ICD-10-CM | POA: Insufficient documentation

## 2011-12-12 DIAGNOSIS — C3490 Malignant neoplasm of unspecified part of unspecified bronchus or lung: Secondary | ICD-10-CM

## 2011-12-12 DIAGNOSIS — R7302 Impaired glucose tolerance (oral): Secondary | ICD-10-CM | POA: Insufficient documentation

## 2011-12-12 DIAGNOSIS — E785 Hyperlipidemia, unspecified: Secondary | ICD-10-CM | POA: Insufficient documentation
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
## 2011-12-13 ENCOUNTER — Telehealth: Payer: Self-pay | Admitting: *Deleted

## 2011-12-13 NOTE — Telephone Encounter (Signed)
Those were already discussed with pt as well as the PET scan on 12/06/2011.  See note under imaging.  They have already been sent down to be scanned.

## 2011-12-13 NOTE — Telephone Encounter (Signed)
PFT results placed in your results folder for review.

## 2011-12-14 ENCOUNTER — Other Ambulatory Visit: Payer: Self-pay | Admitting: Surgery

## 2011-12-18 ENCOUNTER — Encounter (HOSPITAL_COMMUNITY): Payer: Medicare Other

## 2011-12-18 LAB — FUNGUS CULTURE W SMEAR

## 2011-12-19 ENCOUNTER — Ambulatory Visit (INDEPENDENT_AMBULATORY_CARE_PROVIDER_SITE_OTHER): Payer: Medicare Other | Admitting: Surgery

## 2011-12-19 ENCOUNTER — Ambulatory Visit (HOSPITAL_COMMUNITY)
Admission: RE | Admit: 2011-12-19 | Discharge: 2011-12-19 | Disposition: A | Discharge status: Home / Self Care | Payer: Medicare Other | Source: Ambulatory Visit | Attending: Surgery | Admitting: Surgery

## 2011-12-19 ENCOUNTER — Encounter: Payer: Self-pay | Admitting: Surgery

## 2011-12-19 VITALS — BP 164/81 | HR 69 | Resp 18 | Ht 71.0 in | Wt 321.0 lb

## 2011-12-19 DIAGNOSIS — C3490 Malignant neoplasm of unspecified part of unspecified bronchus or lung: Secondary | ICD-10-CM

## 2011-12-19 DIAGNOSIS — R51 Headache: Secondary | ICD-10-CM | POA: Insufficient documentation

## 2011-12-19 LAB — BLOOD GAS, ARTERIAL
Acid-base deficit: 1.9 mmol/L (ref 0.0–2.0)
TCO2: 23.4 mmol/L (ref 0–100)
pCO2 arterial: 37 mmHg (ref 35.0–45.0)
pO2, Arterial: 60.9 mmHg — ABNORMAL LOW (ref 80.0–100.0)

## 2011-12-19 MED ORDER — GADOBENATE DIMEGLUMINE 529 MG/ML IV SOLN
20.0000 mL | Freq: Once | INTRAVENOUS | Status: AC
  Administered 2011-12-19: 20 mL via INTRAVENOUS

## 2011-12-20 ENCOUNTER — Other Ambulatory Visit: Payer: Self-pay | Admitting: *Deleted

## 2011-12-20 DIAGNOSIS — C801 Malignant (primary) neoplasm, unspecified: Secondary | ICD-10-CM

## 2011-12-22 ENCOUNTER — Encounter: Payer: Self-pay | Admitting: Pulmonary Disease

## 2011-12-22 ENCOUNTER — Ambulatory Visit (INDEPENDENT_AMBULATORY_CARE_PROVIDER_SITE_OTHER): Payer: Medicare Other | Admitting: Pulmonary Disease

## 2011-12-22 VITALS — BP 140/78 | HR 71 | Temp 97.9°F | Ht 70.0 in | Wt 325.8 lb

## 2011-12-22 DIAGNOSIS — J984 Other disorders of lung: Secondary | ICD-10-CM | POA: Insufficient documentation

## 2011-12-22 DIAGNOSIS — C3490 Malignant neoplasm of unspecified part of unspecified bronchus or lung: Secondary | ICD-10-CM

## 2011-12-22 DIAGNOSIS — E669 Obesity, unspecified: Secondary | ICD-10-CM | POA: Insufficient documentation

## 2011-12-22 NOTE — Assessment & Plan Note (Signed)
The pt is stable from a pulmonary standpoint today with a sat of 96% and clear lung fields except for crackles in the area of his cancer.  He is stable for upcoming thoracic surgery, and will have to work on early OOB and IS postop.  He does not have airflow obstruction on PFT's, and therefore does not require maintenance bronchodilators as outpt.

## 2011-12-22 NOTE — Patient Instructions (Addendum)
Work on weight loss Will need to get out of bed as much as possible after your surgery, and work on taking deep breaths.

## 2011-12-22 NOTE — Progress Notes (Signed)
  Subjective:    Patient ID: [name], male    DOB: 1945-02-05, 66 y.o.   MRN: [id number]  HPI Patient comes in today for pulmonary followup after a recent hospitalization for presumed pneumonia.  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  He has had pulmonary function studies that showed no airflow obstruction, positive restriction from his obesity.  He is scheduled for thoracic surgery in a few weeks.  Patient feels that his breathing is at baseline, and denies any significant cough or mucus production.  His oxygen saturation today in the office is 96%.   Review of Systems  Constitutional: Negative for fever and unexpected weight change.  HENT: Negative for ear pain, nosebleeds, congestion, sore throat, rhinorrhea, sneezing, trouble swallowing, dental problem, postnasal drip and sinus pressure.   Eyes: Negative for redness and itching.  Respiratory: Negative for cough, chest tightness, shortness of breath and wheezing.   Cardiovascular: Negative for palpitations and leg swelling.  Gastrointestinal: Negative for nausea and vomiting.  Genitourinary: Negative for dysuria.  Musculoskeletal: Negative for joint swelling.  Skin: Negative for rash.  Neurological: Negative for headaches.  Hematological: Does not bruise/bleed easily.  Psychiatric/Behavioral: Negative for dysphoric mood. The patient is not nervous/anxious.        Objective:   Physical Exam Obese male in no acute distress Nose without purulent or discharge noted Chest with left basilar crackles, no wheezes Cardiac exam with regular rate and rhythm, 2/6 systolic murmur Lower extremities with 2+ edema bilaterally, no cyanosis Alert and oriented, moves all 4 extremities.       Assessment & Plan:

## 2011-12-25 ENCOUNTER — Encounter (HOSPITAL_COMMUNITY): Payer: Self-pay | Admitting: Pharmacy Technician

## 2011-12-27 ENCOUNTER — Encounter: Payer: Self-pay | Admitting: Surgery

## 2011-12-27 NOTE — Progress Notes (Signed)
[address]            [name] [address]          [phone]     HPI:  The patient returns today for review of his complete workup in preparation for left lower lobectomy for lung cancer. The patient is a 66 year old gentleman with a remote history of heavy smoking, morbid obesity, hypercholesterolemia, hypertension, and borderline diabetes who was apparently admitted to the hospital in June 2012 with shortness of breath. He had a CT scan of the chest performed at that time that showed bilateral patchy airspace disease consistent with pulmonary edema as well as a more confluent density in the left lower lobe suggesting possible pneumonia. He said that he also had massive swelling of his lower extremities at that time. He improved with diuresis and antibiotics but it does not appear that there was any continued followup of this confluent density in the left lower lobe. He recently presented with shortness of breath and new onset of wheezing and had a repeat CT scan of the chest that showed significant increase in the size of the left lower lobe confluent density that looks like there may be a dominant mass. He was treated with antibiotics and some diuresis. He underwent flexible bronchoscopy by Dr. Shelle Iron and a biopsy showed atypical cells highly suggestive of adenocarcinoma but not diagnostic. The brushings were diagnostic for adenocarcinoma.  MRI of the brain shows no evidence of metastatic disease. His pulmonary function testing shows an FEV1 of 2.27. There is no significant obstructive disease. His corrected diffusion capacity is 44% of predicted which is consistent with his restrictive lung disease seen by spirometry. I discussed his cardiac status with Dr. Sharyn Lull who has been following him. Dr. Sharyn Lull said that he had a cardiac catheterization about 5 years ago that showed no significant coronary disease. He had an echocardiogram done this year which showed  good left ventricular systolic function with evidence of diastolic dysfunction. He did not feel that any further cardiac workup need to be performed and that he was an acceptable risk for lobectomy.   Current Outpatient Prescriptions  Medication Sig Dispense Refill  . aspirin EC 81 MG tablet Take 81 mg by mouth daily.      . carvedilol (COREG) 3.125 MG tablet Take 3.125 mg by mouth daily with breakfast.       . ramipril (ALTACE) 5 MG tablet Take 5 mg by mouth daily.      . furosemide (LASIX) 80 MG tablet Take 80 mg by mouth 2 (two) times daily.      . potassium chloride SA (K-DUR,KLOR-CON) 20 MEQ tablet Take 20 mEq by mouth 2 (two) times daily.      Marland Kitchen spironolactone (ALDACTONE) 25 MG tablet Take 25 mg by mouth daily.          Physical Exam: BP 164/81  Pulse 69  Resp 18  Ht 5\' 11"  (1.803 m)  Wt 321 lb (145.605 kg)  BMI 44.77 kg/m2  SpO2 91% He looks well. Cardiac exam shows a regular rate and rhythm with normal heart sounds. Lung exam reveals rhonchi over the left lower lobe.  Diagnostic Tests:  *RADIOLOGY REPORT*   Clinical Data: 66 year old male recent diagnosis of lung cancer. Staging.  New onset headache.   MRI HEAD WITHOUT AND WITH CONTRAST   Technique:  Multiplanar, multiecho pulse sequences of  the brain and surrounding structures were obtained according to standard protocol without and with intravenous contrast   Contrast: 20mL MULTIHANCE GADOBENATE DIMEGLUMINE 529 MG/ML IV SOLN   Comparison: None.   Findings: No abnormal enhancement identified.  Visualized bone marrow signal is within normal limits.   5 mm T2 hyperintense intrasellar nodule on the left (series 9 image 16) shows evidence of hypoenhancement on coronal images.  No suprasellar mass or mass effect.  No other intracranial mass lesion. No restricted diffusion to suggest acute infarction.  No ventriculomegaly. No acute intracranial hemorrhage identified. 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  Negative cervicomedullary junction and visualized cervical spine. Major intracranial vascular flow voids are preserved.  Mild periventricular white matter T2 and FLAIR hyperintensity.  No cortical encephalomalacia.  Brainstem and cerebellum within normal limits.   Small paranasal sinus mucous retention cysts.  Mastoids are clear. Probable left face sebaceous cyst (series 5 image 4). Visualized orbit soft tissues are within normal limits.   IMPRESSION: 1. No acute or metastatic intracranial abnormality. 2.  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  No significant mass effect.  These can be functioning or nonfunctioning (clinically silent).     Original Report Authenticated By: Erskine Speed, M.D.   Impression:  He has adenocarcinoma of the left lower lobe of lung with an otherwise negative PET scan and negative MRI of the brain. He has seen his pulmonary physician Dr. Shelle Iron and he feels that he has adequate pulmonary function to tolerate left lower lobectomy. He does have a marked reduction in his diffusion capacity and evidence of restrictive lung disease but the left lower lobe is obviously not functioning and I think he will most likely tolerate left lower lobectomy. No further cardiac workup is needed. I discussed the operative procedure with the patient and his family including alternatives, benefits, and risks including but not limited to bleeding, blood transfusion, infection, bronchial stump complications, prolonged air leak from the lung, pleural space complications, the possibility that there is metastatic microscopic disease that will require further treatment, and recurrence of his cancer. He understands all this and would like to proceed with surgery. He would like to wait until after Thanksgiving.  Plan:  We'll plan surgery right after Thanksgiving.

## 2012-01-02 ENCOUNTER — Encounter (HOSPITAL_COMMUNITY)
Admission: RE | Admit: 2012-01-02 | Discharge: 2012-01-02 | Disposition: A | Discharge status: Home / Self Care | Payer: Medicare Other | Source: Ambulatory Visit | Attending: Surgery | Admitting: Surgery

## 2012-01-02 ENCOUNTER — Encounter (HOSPITAL_COMMUNITY): Payer: Self-pay

## 2012-01-02 VITALS — BP 164/76 | HR 65 | Temp 98.0°F | Resp 20 | Ht 70.0 in | Wt 322.3 lb

## 2012-01-02 DIAGNOSIS — C801 Malignant (primary) neoplasm, unspecified: Secondary | ICD-10-CM

## 2012-01-02 HISTORY — DX: Cardiac murmur, unspecified: R01.1

## 2012-01-02 LAB — BLOOD GAS, ARTERIAL
Bicarbonate: 24.6 mEq/L — ABNORMAL HIGH (ref 20.0–24.0)
Drawn by: 344381
FIO2: 0.21 %
Patient temperature: 98.6
pH, Arterial: 7.423 (ref 7.350–7.450)

## 2012-01-02 LAB — COMPREHENSIVE METABOLIC PANEL
AST: 24 U/L (ref 0–37)
BUN: 11 mg/dL (ref 6–23)
CO2: 27 mEq/L (ref 19–32)
Calcium: 9.4 mg/dL (ref 8.4–10.5)
Chloride: 104 mEq/L (ref 96–112)
Creatinine, Ser: 0.97 mg/dL (ref 0.50–1.35)
GFR calc Af Amer: >90 mL/min (ref >90)
GFR calc non Af Amer: 84 mL/min — ABNORMAL LOW (ref >90)
Glucose, Bld: 95 mg/dL (ref 70–99)
Total Bilirubin: 0.5 mg/dL (ref 0.3–1.2)

## 2012-01-02 LAB — SURGICAL PCR SCREEN
MRSA, PCR: NEGATIVE
Staphylococcus aureus: NEGATIVE

## 2012-01-02 LAB — URINALYSIS, ROUTINE W REFLEX MICROSCOPIC
Bilirubin Urine: NEGATIVE
Glucose, UA: NEGATIVE mg/dL
Leukocytes, UA: NEGATIVE
Nitrite: NEGATIVE
Specific Gravity, Urine: 1.027 (ref 1.005–1.030)
pH: 7 (ref 5.0–8.0)

## 2012-01-02 LAB — CBC
MCHC: 34.2 g/dL (ref 30.0–36.0)
Platelets: 207 10*3/uL (ref 150–400)
RDW: 13.4 % (ref 11.5–15.5)

## 2012-01-02 LAB — PROTIME-INR
INR: 1.09 (ref 0.00–1.49)
Prothrombin Time: 14 seconds (ref 11.6–15.2)

## 2012-01-02 NOTE — Pre-Procedure Instructions (Signed)
20 [name]  01/02/2012   Your procedure is scheduled on:  Thursday January 04, 2012 at 0730 AM  Report to [facility] at 0530 AM.  Call this number if you have problems the morning of surgery: [phone]   Remember:   Do not eat food or drink:After Midnight.Wednesday      Take these medicines the morning of surgery with A SIP OF WATER: Coreg    Do not wear jewelry.  Do not wear lotions. You may wear deodorant.             Men may shave face and neck.  Do not bring valuables to the hospital.  Contacts, dentures or bridgework may not be worn into surgery.  Leave suitcase in the car. After surgery it may be brought to your room.  For patients admitted to the hospital, checkout time is 11:00 AM the day of discharge.   Patients discharged the day of surgery will not be allowed to drive home.    Special Instructions: Shower using CHG 2 nights before surgery and the night before surgery.  If you shower the day of surgery use CHG.  Use special wash - you have one bottle of CHG for all showers.  You should use approximately 1/3 of the bottle for each shower.   Please read over the following fact sheets that you were given: Pain Booklet, Coughing and Deep Breathing, Blood Transfusion Information, MRSA Information and Surgical Site Infection Prevention.

## 2012-01-02 NOTE — Progress Notes (Signed)
This pt has screened at an elevated risk for obstructive sleep apnea using the STOP-Bang tool during a pre surgical visit. A score of 4 or greater is an elevated risk.

## 2012-01-03 LAB — AFB CULTURE WITH SMEAR (NOT AT ARMC)

## 2012-01-03 MED ORDER — DEXTROSE 5 % IV SOLN
1.5000 g | INTRAVENOUS | Status: DC
  Filled 2012-01-03: qty 1.5
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
## 2012-01-04 ENCOUNTER — Encounter (HOSPITAL_COMMUNITY)
Admission: RE | Disposition: A | Discharge status: Home / Self Care | Payer: Self-pay | Source: Ambulatory Visit | Attending: Surgery

## 2012-01-04 ENCOUNTER — Inpatient Hospital Stay (HOSPITAL_COMMUNITY): Payer: Medicare Other | Admitting: Anesthesiology

## 2012-01-04 ENCOUNTER — Encounter (HOSPITAL_COMMUNITY): Payer: Self-pay | Admitting: Anesthesiology

## 2012-01-04 ENCOUNTER — Inpatient Hospital Stay (HOSPITAL_COMMUNITY): Payer: Medicare Other

## 2012-01-04 ENCOUNTER — Encounter (HOSPITAL_COMMUNITY): Payer: Self-pay | Admitting: Certified Registered Nurse Anesthetist

## 2012-01-04 ENCOUNTER — Inpatient Hospital Stay (HOSPITAL_COMMUNITY)
Admission: RE | Admit: 2012-01-04 | Discharge: 2012-01-12 | DRG: 164 | Disposition: A | Discharge status: Home / Self Care | Payer: Medicare Other | Source: Ambulatory Visit | Attending: Surgery | Admitting: Surgery

## 2012-01-04 DIAGNOSIS — Z79899 Other long term (current) drug therapy: Secondary | ICD-10-CM

## 2012-01-04 DIAGNOSIS — I872 Venous insufficiency (chronic) (peripheral): Secondary | ICD-10-CM | POA: Diagnosis present

## 2012-01-04 DIAGNOSIS — E785 Hyperlipidemia, unspecified: Secondary | ICD-10-CM | POA: Diagnosis present

## 2012-01-04 DIAGNOSIS — C3430 Malignant neoplasm of lower lobe, unspecified bronchus or lung: Secondary | ICD-10-CM

## 2012-01-04 DIAGNOSIS — Z7982 Long term (current) use of aspirin: Secondary | ICD-10-CM

## 2012-01-04 DIAGNOSIS — C3490 Malignant neoplasm of unspecified part of unspecified bronchus or lung: Secondary | ICD-10-CM | POA: Diagnosis present

## 2012-01-04 DIAGNOSIS — I10 Essential (primary) hypertension: Secondary | ICD-10-CM | POA: Diagnosis present

## 2012-01-04 DIAGNOSIS — C801 Malignant (primary) neoplasm, unspecified: Secondary | ICD-10-CM

## 2012-01-04 DIAGNOSIS — R338 Other retention of urine: Secondary | ICD-10-CM | POA: Diagnosis not present

## 2012-01-04 DIAGNOSIS — Z6841 Body Mass Index (BMI) 40.0 and over, adult: Secondary | ICD-10-CM

## 2012-01-04 DIAGNOSIS — N289 Disorder of kidney and ureter, unspecified: Secondary | ICD-10-CM | POA: Diagnosis not present

## 2012-01-04 DIAGNOSIS — E739 Lactose intolerance, unspecified: Secondary | ICD-10-CM | POA: Diagnosis present

## 2012-01-04 DIAGNOSIS — E7800 Pure hypercholesterolemia, unspecified: Secondary | ICD-10-CM | POA: Diagnosis present

## 2012-01-04 DIAGNOSIS — I509 Heart failure, unspecified: Secondary | ICD-10-CM | POA: Diagnosis present

## 2012-01-04 DIAGNOSIS — Z87891 Personal history of nicotine dependence: Secondary | ICD-10-CM

## 2012-01-04 DIAGNOSIS — I5032 Chronic diastolic (congestive) heart failure: Secondary | ICD-10-CM | POA: Diagnosis present

## 2012-01-04 HISTORY — PX: FLEXIBLE BRONCHOSCOPY: SHX5094

## 2012-01-04 HISTORY — PX: THORACOTOMY: SHX5074

## 2012-01-04 HISTORY — PX: LOBECTOMY: SHX5089

## 2012-01-04 SURGERY — BRONCHOSCOPY, FLEXIBLE
Anesthesia: General | Site: Chest | Wound class: Clean Contaminated

## 2012-01-04 MED ORDER — HYDROMORPHONE HCL PF 1 MG/ML IJ SOLN
0.2500 mg | INTRAMUSCULAR | Status: DC | PRN
  Administered 2012-01-04 (×4): 0.25 mg via INTRAVENOUS

## 2012-01-04 MED ORDER — KETOROLAC TROMETHAMINE 15 MG/ML IJ SOLN
15.0000 mg | Freq: Four times a day (QID) | INTRAMUSCULAR | Status: DC | PRN
  Administered 2012-01-04 – 2012-01-05 (×2): 15 mg via INTRAVENOUS
  Filled 2012-01-04 (×2): qty 1

## 2012-01-04 MED ORDER — ONDANSETRON HCL 4 MG/2ML IJ SOLN: 4.0000 mg | Freq: Four times a day (QID) | INTRAMUSCULAR | Status: DC | PRN

## 2012-01-04 MED ORDER — HYDROMORPHONE 0.3 MG/ML IV SOLN
INTRAVENOUS | Status: DC
  Administered 2012-01-04: 0.9 mg via INTRAVENOUS
  Administered 2012-01-04 – 2012-01-05 (×2): 2.4 mg via INTRAVENOUS
  Administered 2012-01-05: 05:00:00 via INTRAVENOUS
  Administered 2012-01-05: 1.2 mg via INTRAVENOUS
  Administered 2012-01-05: 3.9 mg via INTRAVENOUS
  Administered 2012-01-05: 1.5 mg via INTRAVENOUS
  Administered 2012-01-05: 20:00:00 via INTRAVENOUS
  Administered 2012-01-05: 1.2 mg via INTRAVENOUS
  Administered 2012-01-06: 14:00:00 via INTRAVENOUS
  Administered 2012-01-06: 2.1 mg via INTRAVENOUS
  Administered 2012-01-06: 1.2 mg via INTRAVENOUS
  Administered 2012-01-06: 2.1 mg via INTRAVENOUS
  Administered 2012-01-06: 0.6 mg via INTRAVENOUS
  Administered 2012-01-06: 1.5 mg via INTRAVENOUS
  Administered 2012-01-06: 2.4 mg via INTRAVENOUS
  Administered 2012-01-07: 1.5 mg via INTRAVENOUS
  Administered 2012-01-07: 2.1 mg via INTRAVENOUS
  Administered 2012-01-07: 1.8 mg via INTRAVENOUS
  Administered 2012-01-07: 06:00:00 via INTRAVENOUS
  Administered 2012-01-07: 0.6 mg via INTRAVENOUS
  Administered 2012-01-07: 2.1 mg via INTRAVENOUS
  Administered 2012-01-08: 01:00:00 via INTRAVENOUS
  Administered 2012-01-08: 1.5 mg via INTRAVENOUS
  Administered 2012-01-08: 1.9 mg via INTRAVENOUS
  Administered 2012-01-08: 3.6 mg via INTRAVENOUS
  Administered 2012-01-08: 2.7 mg via INTRAVENOUS
  Administered 2012-01-08: 3.9 mg via INTRAVENOUS
  Administered 2012-01-09: 0.9 mg via INTRAVENOUS
  Administered 2012-01-09: 1.8 mg via INTRAVENOUS
  Administered 2012-01-09: 3.6 mg via INTRAVENOUS
  Administered 2012-01-09: 2.4 mg via INTRAVENOUS
  Administered 2012-01-09: 01:00:00 via INTRAVENOUS
  Filled 2012-01-04 (×8): qty 25

## 2012-01-04 MED ORDER — GLYCOPYRROLATE 0.2 MG/ML IJ SOLN
INTRAMUSCULAR | Status: DC | PRN
  Administered 2012-01-04: 0.6 mg via INTRAVENOUS

## 2012-01-04 MED ORDER — DEXTROSE-NACL 5-0.45 % IV SOLN
INTRAVENOUS | Status: DC
  Administered 2012-01-04 – 2012-01-06 (×4): via INTRAVENOUS
  Administered 2012-01-08: 20 mL/h via INTRAVENOUS

## 2012-01-04 MED ORDER — POTASSIUM CHLORIDE 10 MEQ/50ML IV SOLN
10.0000 meq | Freq: Every day | INTRAVENOUS | Status: DC | PRN
  Filled 2012-01-04: qty 50

## 2012-01-04 MED ORDER — HYDROMORPHONE 0.3 MG/ML IV SOLN
INTRAVENOUS | Status: AC
  Administered 2012-01-04: 14:00:00
  Filled 2012-01-04: qty 25

## 2012-01-04 MED ORDER — DEXTROSE 5 % IV SOLN
1.5000 g | Freq: Two times a day (BID) | INTRAVENOUS | Status: AC
  Administered 2012-01-04 – 2012-01-05 (×2): 1.5 g via INTRAVENOUS
  Filled 2012-01-04 (×3): qty 1.5

## 2012-01-04 MED ORDER — BUPIVACAINE HCL (PF) 0.5 % IJ SOLN
INTRAMUSCULAR | Status: AC
  Filled 2012-01-04: qty 30

## 2012-01-04 MED ORDER — LIDOCAINE HCL (CARDIAC) 20 MG/ML IV SOLN
INTRAVENOUS | Status: DC | PRN
  Administered 2012-01-04: 40 mg via INTRAVENOUS

## 2012-01-04 MED ORDER — OXYCODONE HCL 5 MG PO TABS
5.0000 mg | ORAL_TABLET | ORAL | Status: AC | PRN
  Administered 2012-01-04: 10 mg via ORAL
  Administered 2012-01-05: 5 mg via ORAL
  Filled 2012-01-04 (×3): qty 1

## 2012-01-04 MED ORDER — HYDROMORPHONE HCL PF 1 MG/ML IJ SOLN
INTRAMUSCULAR | Status: AC
  Filled 2012-01-04: qty 1

## 2012-01-04 MED ORDER — ACETAMINOPHEN 10 MG/ML IV SOLN
1000.0000 mg | Freq: Four times a day (QID) | INTRAVENOUS | Status: AC
  Administered 2012-01-04 – 2012-01-05 (×4): 1000 mg via INTRAVENOUS
  Filled 2012-01-04 (×4): qty 100

## 2012-01-04 MED ORDER — RAMIPRIL 5 MG PO TABS
5.0000 mg | ORAL_TABLET | Freq: Every day | ORAL | Status: DC
Start: 2012-01-04 — End: 2012-01-04

## 2012-01-04 MED ORDER — SODIUM CHLORIDE 0.9 % IJ SOLN
9.0000 mL | INTRAMUSCULAR | Status: DC | PRN
  Administered 2012-01-05: 10 mL via INTRAVENOUS

## 2012-01-04 MED ORDER — PROMETHAZINE HCL 25 MG/ML IJ SOLN: 6.2500 mg | INTRAMUSCULAR | Status: DC | PRN

## 2012-01-04 MED ORDER — OXYCODONE-ACETAMINOPHEN 5-325 MG PO TABS
1.0000 | ORAL_TABLET | ORAL | Status: DC | PRN
  Administered 2012-01-06: 2 via ORAL
  Administered 2012-01-06: 1 via ORAL
  Administered 2012-01-09: 2 via ORAL
  Administered 2012-01-09: 1 via ORAL
  Filled 2012-01-04 (×2): qty 1
  Filled 2012-01-04 (×2): qty 2

## 2012-01-04 MED ORDER — FENTANYL CITRATE 0.05 MG/ML IJ SOLN
INTRAMUSCULAR | Status: DC | PRN
  Administered 2012-01-04: 100 ug via INTRAVENOUS
  Administered 2012-01-04: 50 ug via INTRAVENOUS
  Administered 2012-01-04 (×2): 100 ug via INTRAVENOUS

## 2012-01-04 MED ORDER — SENNOSIDES-DOCUSATE SODIUM 8.6-50 MG PO TABS
1.0000 | ORAL_TABLET | Freq: Every evening | ORAL | Status: DC | PRN
  Filled 2012-01-04: qty 1

## 2012-01-04 MED ORDER — NALOXONE HCL 0.4 MG/ML IJ SOLN: 0.4000 mg | INTRAMUSCULAR | Status: DC | PRN

## 2012-01-04 MED ORDER — NEOSTIGMINE METHYLSULFATE 1 MG/ML IJ SOLN
INTRAMUSCULAR | Status: DC | PRN
  Administered 2012-01-04: 5 mg via INTRAVENOUS

## 2012-01-04 MED ORDER — 0.9 % SODIUM CHLORIDE (POUR BTL) OPTIME
TOPICAL | Status: DC | PRN
  Administered 2012-01-04: 1000 mL
  Administered 2012-01-04: 3000 mL

## 2012-01-04 MED ORDER — CARVEDILOL 3.125 MG PO TABS
3.1250 mg | ORAL_TABLET | Freq: Every day | ORAL | Status: DC
  Filled 2012-01-04 (×2): qty 1

## 2012-01-04 MED ORDER — LACTATED RINGERS IV SOLN
INTRAVENOUS | Status: DC | PRN
  Administered 2012-01-04: 07:00:00 via INTRAVENOUS

## 2012-01-04 MED ORDER — BISACODYL 5 MG PO TBEC
10.0000 mg | DELAYED_RELEASE_TABLET | Freq: Every day | ORAL | Status: DC
  Administered 2012-01-04 – 2012-01-11 (×6): 10 mg via ORAL
  Filled 2012-01-04 (×6): qty 2

## 2012-01-04 MED ORDER — PHENYLEPHRINE HCL 10 MG/ML IJ SOLN
20.0000 mg | INTRAVENOUS | Status: DC | PRN
  Administered 2012-01-04: 25 ug/min via INTRAVENOUS

## 2012-01-04 MED ORDER — DIPHENHYDRAMINE HCL 12.5 MG/5ML PO ELIX
12.5000 mg | ORAL_SOLUTION | Freq: Four times a day (QID) | ORAL | Status: DC | PRN
  Filled 2012-01-04: qty 5

## 2012-01-04 MED ORDER — ONDANSETRON HCL 4 MG/2ML IJ SOLN
INTRAMUSCULAR | Status: DC | PRN
  Administered 2012-01-04: 4 mg via INTRAVENOUS

## 2012-01-04 MED ORDER — DIPHENHYDRAMINE HCL 50 MG/ML IJ SOLN: 12.5000 mg | Freq: Four times a day (QID) | INTRAMUSCULAR | Status: DC | PRN

## 2012-01-04 MED ORDER — POTASSIUM CHLORIDE CRYS ER 20 MEQ PO TBCR
20.0000 meq | EXTENDED_RELEASE_TABLET | Freq: Two times a day (BID) | ORAL | Status: DC
  Administered 2012-01-04: 20 meq via ORAL
  Filled 2012-01-04 (×3): qty 1

## 2012-01-04 MED ORDER — TRAMADOL HCL 50 MG PO TABS
50.0000 mg | ORAL_TABLET | Freq: Four times a day (QID) | ORAL | Status: DC | PRN
  Administered 2012-01-09 – 2012-01-11 (×4): 100 mg via ORAL
  Filled 2012-01-04 (×5): qty 2

## 2012-01-04 MED ORDER — ROCURONIUM BROMIDE 100 MG/10ML IV SOLN
INTRAVENOUS | Status: DC | PRN
  Administered 2012-01-04: 50 mg via INTRAVENOUS
  Administered 2012-01-04: 10 mg via INTRAVENOUS
  Administered 2012-01-04: 50 mg via INTRAVENOUS
  Administered 2012-01-04: 30 mg via INTRAVENOUS
  Administered 2012-01-04 (×2): 20 mg via INTRAVENOUS

## 2012-01-04 MED ORDER — OXYCODONE HCL 5 MG/5ML PO SOLN: 5.0000 mg | Freq: Once | ORAL | Status: DC | PRN

## 2012-01-04 MED ORDER — KETOROLAC TROMETHAMINE 30 MG/ML IJ SOLN
INTRAMUSCULAR | Status: AC
  Filled 2012-01-04: qty 1

## 2012-01-04 MED ORDER — LEVALBUTEROL HCL 0.63 MG/3ML IN NEBU
0.6300 mg | INHALATION_SOLUTION | Freq: Four times a day (QID) | RESPIRATORY_TRACT | Status: DC
  Administered 2012-01-04 – 2012-01-12 (×31): 0.63 mg via RESPIRATORY_TRACT
  Filled 2012-01-04 (×35): qty 3

## 2012-01-04 MED ORDER — OXYCODONE HCL 5 MG PO TABS: 5.0000 mg | ORAL_TABLET | Freq: Once | ORAL | Status: DC | PRN

## 2012-01-04 MED ORDER — RAMIPRIL 5 MG PO CAPS
5.0000 mg | ORAL_CAPSULE | Freq: Every day | ORAL | Status: DC
  Filled 2012-01-04 (×3): qty 1

## 2012-01-04 MED ORDER — PROPOFOL 10 MG/ML IV BOLUS
INTRAVENOUS | Status: DC | PRN
  Administered 2012-01-04: 150 mg via INTRAVENOUS

## 2012-01-04 MED ORDER — ARTIFICIAL TEARS OP OINT
TOPICAL_OINTMENT | OPHTHALMIC | Status: DC | PRN
  Administered 2012-01-04: 1 via OPHTHALMIC

## 2012-01-04 MED ORDER — INSULIN ASPART 100 UNIT/ML ~~LOC~~ SOLN
0.0000 [IU] | SUBCUTANEOUS | Status: DC
  Administered 2012-01-05 – 2012-01-09 (×8): 2 [IU] via SUBCUTANEOUS

## 2012-01-04 MED ORDER — MIDAZOLAM HCL 5 MG/5ML IJ SOLN
INTRAMUSCULAR | Status: DC | PRN
  Administered 2012-01-04: 2 mg via INTRAVENOUS

## 2012-01-04 MED ORDER — FUROSEMIDE 80 MG PO TABS
80.0000 mg | ORAL_TABLET | Freq: Two times a day (BID) | ORAL | Status: DC
  Filled 2012-01-04 (×5): qty 1

## 2012-01-04 MED ORDER — SPIRONOLACTONE 25 MG PO TABS
25.0000 mg | ORAL_TABLET | Freq: Every day | ORAL | Status: DC
  Filled 2012-01-04 (×2): qty 1

## 2012-01-04 MED ORDER — LIDOCAINE HCL 4 % MT SOLN
OROMUCOSAL | Status: DC | PRN
  Administered 2012-01-04: 4 mL via TOPICAL

## 2012-01-04 SURGICAL SUPPLY — 87 items
ADH SKN CLS APL DERMABOND .7 (GAUZE/BANDAGES/DRESSINGS) ×2
BALL CTTN LRG ABS STRL LF (GAUZE/BANDAGES/DRESSINGS)
BRUSH CYTOL CELLEBRITY 1.5X140 (MISCELLANEOUS) IMPLANT
CANISTER SUCTION 2500CC (MISCELLANEOUS) ×6 IMPLANT
CATH KIT ON Q 5IN SLV (PAIN MANAGEMENT) IMPLANT
CATH THORACIC 28FR (CATHETERS) ×1 IMPLANT
CATH THORACIC 36FR (CATHETERS) IMPLANT
CATH THORACIC 36FR RT ANG (CATHETERS) IMPLANT
CLIP TI MEDIUM 24 (CLIP) ×1 IMPLANT
CLIP TI MEDIUM 6 (CLIP) ×2 IMPLANT
CLIP TI WIDE RED SMALL 6 (CLIP) ×2 IMPLANT
CLOTH BEACON ORANGE TIMEOUT ST (SAFETY) ×3 IMPLANT
CONN ST 1/4X3/8  BEN (MISCELLANEOUS) ×1
CONN ST 1/4X3/8 BEN (MISCELLANEOUS) IMPLANT
CONN Y 3/8X3/8X3/8  BEN (MISCELLANEOUS)
CONN Y 3/8X3/8X3/8 BEN (MISCELLANEOUS) IMPLANT
CONT SPEC 4OZ CLIKSEAL STRL BL (MISCELLANEOUS) ×6 IMPLANT
COTTONBALL LRG STERILE PKG (GAUZE/BANDAGES/DRESSINGS) IMPLANT
COVER SURGICAL LIGHT HANDLE (MISCELLANEOUS) ×6 IMPLANT
COVER TABLE BACK 60X90 (DRAPES) ×3 IMPLANT
DERMABOND ADVANCED (GAUZE/BANDAGES/DRESSINGS) ×1
DERMABOND ADVANCED .7 DNX12 (GAUZE/BANDAGES/DRESSINGS) IMPLANT
DRAIN CHANNEL 28F RND 3/8 FF (WOUND CARE) IMPLANT
DRAIN CHANNEL 32F RND 10.7 FF (WOUND CARE) ×1 IMPLANT
DRAPE LAPAROSCOPIC ABDOMINAL (DRAPES) ×3 IMPLANT
DRAPE WARM FLUID 44X44 (DRAPE) ×3 IMPLANT
ELECT REM PT RETURN 9FT ADLT (ELECTROSURGICAL) ×3
ELECTRODE REM PT RTRN 9FT ADLT (ELECTROSURGICAL) ×2 IMPLANT
FORCEPS BIOP RJ4 1.8 (CUTTING FORCEPS) IMPLANT
GLOVE BIO SURGEON STRL SZ 6 (GLOVE) ×1 IMPLANT
GLOVE EUDERMIC 7 POWDERFREE (GLOVE) ×6 IMPLANT
GOWN PREVENTION PLUS XLARGE (GOWN DISPOSABLE) ×3 IMPLANT
GOWN STRL NON-REIN LRG LVL3 (GOWN DISPOSABLE) ×6 IMPLANT
KIT BASIN OR (CUSTOM PROCEDURE TRAY) ×3 IMPLANT
KIT ROOM TURNOVER OR (KITS) ×3 IMPLANT
MARKER SKIN DUAL TIP RULER LAB (MISCELLANEOUS) ×3 IMPLANT
NDL BIOPSY TRANSBRONCH 21G (NEEDLE) IMPLANT
NEEDLE 22X1 1/2 (OR ONLY) (NEEDLE) IMPLANT
NEEDLE BIOPSY TRANSBRONCH 21G (NEEDLE) IMPLANT
NS IRRIG 1000ML POUR BTL (IV SOLUTION) ×8 IMPLANT
OIL SILICONE PENTAX (PARTS (SERVICE/REPAIRS)) ×3 IMPLANT
PACK CHEST (CUSTOM PROCEDURE TRAY) ×3 IMPLANT
PAD ARMBOARD 7.5X6 YLW CONV (MISCELLANEOUS) ×6 IMPLANT
RELOAD EGIA 45 TAN VASC (STAPLE) IMPLANT
RELOAD EGIA TRIS TAN 45 CVD (STAPLE) ×12 IMPLANT
RELOAD ENDO GIA 30 3.5 (STAPLE) ×1 IMPLANT
RELOAD STAPLE 45 TAN MED CVD (STAPLE) IMPLANT
RELOAD STAPLE 60 BLK XTHK ART (STAPLE) IMPLANT
RELOAD TRI 2.0 60 XTHK VAS SUL (STAPLE) ×3 IMPLANT
SEALANT PROGEL (MISCELLANEOUS) IMPLANT
SEALANT SURG COSEAL 4ML (VASCULAR PRODUCTS) IMPLANT
SEALANT SURG COSEAL 8ML (VASCULAR PRODUCTS) IMPLANT
SOLUTION ANTI FOG 6CC (MISCELLANEOUS) ×3 IMPLANT
SPONGE GAUZE 4X4 12PLY (GAUZE/BANDAGES/DRESSINGS) ×4 IMPLANT
STAPLER TA30 4.8 NON-ABS (STAPLE) ×1 IMPLANT
SUT PROLENE 3 0 SH DA (SUTURE) IMPLANT
SUT SILK  1 MH (SUTURE) ×2
SUT SILK 1 MH (SUTURE) ×4 IMPLANT
SUT SILK 2 0 SH (SUTURE) ×3 IMPLANT
SUT SILK 2 0SH CR/8 30 (SUTURE) ×1 IMPLANT
SUT SILK 3 0SH CR/8 30 (SUTURE) ×1 IMPLANT
SUT VIC AB 1 CTX 36 (SUTURE) ×9
SUT VIC AB 1 CTX36XBRD ANBCTR (SUTURE) ×2 IMPLANT
SUT VIC AB 2-0 CT1 27 (SUTURE)
SUT VIC AB 2-0 CT1 TAPERPNT 27 (SUTURE) IMPLANT
SUT VIC AB 2-0 CTX 36 (SUTURE) ×3 IMPLANT
SUT VIC AB 2-0 UR6 27 (SUTURE) IMPLANT
SUT VIC AB 3-0 MH 27 (SUTURE) ×1 IMPLANT
SUT VIC AB 3-0 SH 27 (SUTURE)
SUT VIC AB 3-0 SH 27X BRD (SUTURE) IMPLANT
SUT VIC AB 3-0 X1 27 (SUTURE) ×3 IMPLANT
SUT VICRYL 2 TP 1 (SUTURE) ×4 IMPLANT
SYR 20ML ECCENTRIC (SYRINGE) ×3 IMPLANT
SYR 5ML LUER SLIP (SYRINGE) ×3 IMPLANT
SYR CONTROL 10ML LL (SYRINGE) IMPLANT
SYSTEM SAHARA CHEST DRAIN ATS (WOUND CARE) ×3 IMPLANT
TAPE CLOTH SURG 4X10 WHT LF (GAUZE/BANDAGES/DRESSINGS) ×2 IMPLANT
TIP APPLICATOR SPRAY EXTEND 16 (VASCULAR PRODUCTS) IMPLANT
TOWEL OR 17X24 6PK STRL BLUE (TOWEL DISPOSABLE) ×3 IMPLANT
TOWEL OR 17X26 10 PK STRL BLUE (TOWEL DISPOSABLE) ×3 IMPLANT
TRAP SPECIMEN MUCOUS 40CC (MISCELLANEOUS) ×3 IMPLANT
TRAY FOLEY CATH 14FRSI W/METER (CATHETERS) ×3 IMPLANT
TUBE CONNECTING 12X1/4 (SUCTIONS) ×3 IMPLANT
VALVE BIOPSY  SINGLE USE (MISCELLANEOUS)
VALVE BIOPSY SINGLE USE (MISCELLANEOUS) IMPLANT
VALVE SUCTION BRONCHIO DISP (MISCELLANEOUS) IMPLANT
WATER STERILE IRR 1000ML POUR (IV SOLUTION) ×6 IMPLANT

## 2012-01-04 NOTE — Progress Notes (Signed)
Report to K. Nevil RN as primary caregiver.

## 2012-01-04 NOTE — Interval H&P Note (Signed)
History and Physical Interval Note:  01/04/2012 7:48 AM  [name]  has presented today for surgery, with the diagnosis of LLL adenocarinoma  The various methods of treatment have been discussed with the patient and family. After consideration of risks, benefits and other options for treatment, the patient has consented to  Procedure(s) (LRB) with comments: FLEXIBLE BRONCHOSCOPY (N/A) THORACOTOMY MAJOR (Left) LOBECTOMY (Left) - left lower lobectomy as a surgical intervention .  The patient's history has been reviewed, patient examined, no change in status, stable for surgery.  I have reviewed the patient's chart and labs.  Questions were answered to the patient's satisfaction.     Alleen Borne

## 2012-01-04 NOTE — Progress Notes (Signed)
Pt was going to 2300 placed on hold bed not ready

## 2012-01-04 NOTE — Anesthesia Preprocedure Evaluation (Addendum)
Anesthesia Evaluation  Patient identified by MRN, date of birth, ID band Patient awake    Reviewed: Allergy & Precautions, H&P , NPO status , Patient's Chart, lab work & pertinent test results, reviewed documented beta blocker date and time   Airway Mallampati: II TM Distance: >3 FB Neck ROM: Full    Dental  (+) Edentulous Upper and Edentulous Lower   Pulmonary former smoker,  + rhonchi         Cardiovascular hypertension, Pt. on medications and Pt. on home beta blockers + Peripheral Vascular Disease     Neuro/Psych    GI/Hepatic negative GI ROS,   Endo/Other  Morbid obesity  Renal/GU negative Renal ROS     Musculoskeletal   Abdominal   Peds  Hematology   Anesthesia Other Findings   Reproductive/Obstetrics                         Anesthesia Physical Anesthesia Plan  ASA: III  Anesthesia Plan: General   Post-op Pain Management:    Induction: Intravenous  Airway Management Planned: Double Lumen EBT  Additional Equipment:   Intra-op Plan:   Post-operative Plan: Extubation in OR  Informed Consent: I have reviewed the patients History and Physical, chart, labs and discussed the procedure including the risks, benefits and alternatives for the proposed anesthesia with the patient or authorized representative who has indicated his/her understanding and acceptance.   Dental advisory given  Plan Discussed with: CRNA, Anesthesiologist and Surgeon  Anesthesia Plan Comments:         Anesthesia Quick Evaluation

## 2012-01-04 NOTE — Progress Notes (Signed)
Report received from [facility]

## 2012-01-04 NOTE — Anesthesia Postprocedure Evaluation (Signed)
Anesthesia Post Note  Patient: [name]  Procedure(s) Performed: Procedure(s) (LRB): FLEXIBLE BRONCHOSCOPY (N/A) THORACOTOMY MAJOR (Left) LOBECTOMY (Left)  Anesthesia type: general  Patient location: PACU  Post pain: Pain level controlled  Post assessment: Patient's Cardiovascular Status Stable  Last Vitals:  Filed Vitals:   01/04/12 1445  BP:   Pulse: 61  Temp:   Resp: 19    Post vital signs: Reviewed and stable  Level of consciousness: sedated  Complications: No apparent anesthesia complications

## 2012-01-04 NOTE — Progress Notes (Signed)
Initiated PCA instructed patient

## 2012-01-04 NOTE — Progress Notes (Signed)
Patient ID: [name], male   DOB: [birth year], 66 y.o.   MRN: [id number]   Hemodynamically stable. Pain under control. Urine output ok  CT output low. No air leak.

## 2012-01-04 NOTE — OR Nursing (Signed)
First procedure (Bronchoscopy) ended at 52. Second procedure started at 0919.

## 2012-01-04 NOTE — Progress Notes (Signed)
taking ice chips no nausea

## 2012-01-04 NOTE — Brief Op Note (Signed)
01/04/2012  12:34 PM  PATIENT:  [name]  66 y.o. male  PRE-OPERATIVE DIAGNOSIS:  Left lower lobe adenocarinoma  POST-OPERATIVE DIAGNOSIS:  Left lower lobe adenocarinoma  PROCEDURE:  Procedure(s) (LRB) with comments: FLEXIBLE BRONCHOSCOPY (N/A) THORACOTOMY MAJOR (Left) LOBECTOMY (Left) - left lower lobectomy  SURGEON:  Surgeon(s) and Role:    * Alleen Borne, MD - Primary  PHYSICIAN ASSISTANT: Coral Ceo, PA-C   ASSISTANTS: none   ANESTHESIA:   general  EBL:  Total I/O In: 800 [I.V.:800] Out: 995 [Urine:845; Blood:150]  BLOOD ADMINISTERED:none  DRAINS: 1 Chest Tube(s) in the right pleural space anteriorly  and (1 ) Blake drain(s) in the right pleural space posteriorly   LOCAL MEDICATIONS USED:  NONE  SPECIMEN:  Source of Specimen:  left lower lobe and 11L lymph nodes  DISPOSITION OF SPECIMEN:  PATHOLOGY  COUNTS:  YES  PLAN OF CARE: Admit to inpatient   PATIENT DISPOSITION:  PACU - hemodynamically stable.   Delay start of Pharmacological VTE agent (>24hrs) due to surgical blood loss or risk of bleeding: yes

## 2012-01-04 NOTE — Progress Notes (Signed)
Placed mask back on o2 sat dropped into 80's

## 2012-01-04 NOTE — Transfer of Care (Signed)
Immediate Anesthesia Transfer of Care Note  Patient: [name]  Procedure(s) Performed: Procedure(s) (LRB) with comments: FLEXIBLE BRONCHOSCOPY (N/A) THORACOTOMY MAJOR (Left) LOBECTOMY (Left) - left lower lobectomy  Patient Location: PACU  Anesthesia Type:General  Level of Consciousness: sedated and patient cooperative  Airway & Oxygen Therapy: Patient Spontanous Breathing and Patient connected to face mask oxygen  Post-op Assessment: Report given to PACU RN, Post -op Vital signs reviewed and stable and Patient moving all extremities X 4  Post vital signs: Reviewed and stable  Complications: No apparent anesthesia complications

## 2012-01-05 ENCOUNTER — Inpatient Hospital Stay (HOSPITAL_COMMUNITY): Payer: Medicare Other

## 2012-01-05 ENCOUNTER — Encounter (HOSPITAL_COMMUNITY): Payer: Self-pay | Admitting: Surgery

## 2012-01-05 LAB — BASIC METABOLIC PANEL
BUN: 19 mg/dL (ref 6–23)
BUN: 22 mg/dL (ref 6–23)
CO2: 23 mEq/L (ref 19–32)
CO2: 24 mEq/L (ref 19–32)
Calcium: 8.3 mg/dL — ABNORMAL LOW (ref 8.4–10.5)
Calcium: 8.3 mg/dL — ABNORMAL LOW (ref 8.4–10.5)
Chloride: 98 mEq/L (ref 96–112)
Chloride: 98 mEq/L (ref 96–112)
Creatinine, Ser: 2.52 mg/dL — ABNORMAL HIGH (ref 0.50–1.35)
Creatinine, Ser: 2.36 mg/dL — ABNORMAL HIGH (ref 0.50–1.35)
GFR calc Af Amer: 29 mL/min — ABNORMAL LOW (ref >90)
GFR calc Af Amer: 31 mL/min — ABNORMAL LOW (ref >90)
GFR calc non Af Amer: 29 mL/min — ABNORMAL LOW (ref >90)
Glucose, Bld: 139 mg/dL — ABNORMAL HIGH (ref 70–99)
Potassium: 5.3 mEq/L — ABNORMAL HIGH (ref 3.5–5.1)
Sodium: 135 mEq/L (ref 135–145)

## 2012-01-05 LAB — GLUCOSE, CAPILLARY
Glucose-Capillary: 107 mg/dL — ABNORMAL HIGH (ref 70–99)
Glucose-Capillary: 114 mg/dL — ABNORMAL HIGH (ref 70–99)
Glucose-Capillary: 118 mg/dL — ABNORMAL HIGH (ref 70–99)
Glucose-Capillary: 119 mg/dL — ABNORMAL HIGH (ref 70–99)
Glucose-Capillary: 125 mg/dL — ABNORMAL HIGH (ref 70–99)
Glucose-Capillary: 128 mg/dL — ABNORMAL HIGH (ref 70–99)

## 2012-01-05 LAB — CBC
Hemoglobin: 12.3 g/dL — ABNORMAL LOW (ref 13.0–17.0)
MCHC: 33.2 g/dL (ref 30.0–36.0)
Platelets: 204 10*3/uL (ref 150–400)
RBC: 3.85 MIL/uL — ABNORMAL LOW (ref 4.22–5.81)

## 2012-01-05 MED ORDER — DOPAMINE-DEXTROSE 3.2-5 MG/ML-% IV SOLN
3.0000 ug/kg/min | INTRAVENOUS | Status: DC
  Administered 2012-01-05 – 2012-01-06 (×2): 3 ug/kg/min via INTRAVENOUS
  Filled 2012-01-05: qty 250

## 2012-01-05 MED ORDER — ALBUMIN HUMAN 5 % IV SOLN
12.5000 g | Freq: Once | INTRAVENOUS | Status: AC
  Administered 2012-01-05: 12.5 g via INTRAVENOUS

## 2012-01-05 MED ORDER — ALBUMIN HUMAN 5 % IV SOLN
12.5000 g | Freq: Once | INTRAVENOUS | Status: AC
  Administered 2012-01-05: 12.5 g via INTRAVENOUS
  Filled 2012-01-05: qty 250

## 2012-01-05 MED ORDER — SODIUM CHLORIDE 0.9 % IV SOLN
INTRAVENOUS | Status: DC
  Administered 2012-01-05: 20 mL/h via INTRAVENOUS

## 2012-01-05 MED ORDER — ALBUMIN HUMAN 5 % IV SOLN
INTRAVENOUS | Status: AC
  Filled 2012-01-05: qty 250

## 2012-01-05 MED ORDER — DOPAMINE-DEXTROSE 3.2-5 MG/ML-% IV SOLN
INTRAVENOUS | Status: AC
  Filled 2012-01-05: qty 250

## 2012-01-05 NOTE — Progress Notes (Signed)
Patient ID: [name], male   DOB: 04/06/1945, 66 y.o.   MRN: [id number]                   [address]            [name] [address]          [phone]     1 Day Post-Op Procedure(s) (LRB): FLEXIBLE BRONCHOSCOPY (N/A) THORACOTOMY MAJOR (Left) LOBECTOMY (Left)  Total Length of Stay:  LOS: 1 day  BP 100/62  Pulse 73  Temp 99.3 F (37.4 C) (Oral)  Resp 13  Ht 5\' 10"  (1.778 m)  Wt 323 lb 3.1 oz (146.6 kg)  BMI 46.37 kg/m2  SpO2 91%  .Intake/Output      12/05 0701 - 12/06 0700 12/06 0701 - 12/07 0700   P.O. 60 540   I.V. (mL/kg) 2219 (15.1) 1185.7 (8.1)   IV Piggyback 400 600   Total Intake(mL/kg) 2679 (18.3) 2325.7 (15.9)   Urine (mL/kg/hr) 1760 (0.5) 393 (0.2)   Blood 150    Chest Tube 540 110   Total Output 2450 503   Net +229 +1822.7             . sodium chloride 20 mL/hr at 01/05/12 1600  . dextrose 5 % and 0.45% NaCl 80 mL/hr at 01/05/12 1738  . DOPamine 3 mcg/kg/min (01/05/12 1800)     Lab Results  Component Value Date   WBC 9.2 01/05/2012   HGB 12.3* 01/05/2012   HCT 37.0* 01/05/2012   PLT 204 01/05/2012   GLUCOSE 131* 01/05/2012   CHOL 137 06/28/2010   TRIG 100 06/28/2010   HDL 31* 06/28/2010   LDLCALC  Value: 86        Total Cholesterol/HDL:CHD Risk Coronary Heart Disease Risk Table                     Men   Women  1/2 Average Risk   3.4   3.3  Average Risk       5.0   4.4  2 X Average Risk   9.6   7.1  3 X Average Risk  23.4   11.0        Use the calculated Patient Ratio above and the CHD Risk Table to determine the patient's CHD Risk.        ATP III CLASSIFICATION (LDL):  <100     mg/dL   Optimal  564-332  mg/dL   Near or Above                    Optimal  130-159  mg/dL   Borderline  951-884  mg/dL   High  >166     mg/dL   Very High 0/63/0160   ALT 16 01/02/2012   AST 24 01/02/2012   NA 132* 01/05/2012   K 4.9 01/05/2012   CL 98 01/05/2012   CREATININE 2.36* 01/05/2012   BUN 22 01/05/2012   CO2 24 01/05/2012   TSH 1.520 06/27/2010   INR  1.09 01/02/2012   HGBA1C  Value: 5.9 (NOTE)  According to the ADA Clinical Practice Recommendations for 2011, when HbA1c is used as a screening test:   >=6.5%   Diagnostic of Diabetes Mellitus           (if abnormal result  is confirmed)  5.7-6.4%   Increased risk of developing Diabetes Mellitus  References:Diagnosis and Classification of Diabetes Mellitus,Diabetes Care,2011,34(Suppl 1):S62-S69 and Standards of Medical Care in         Diabetes - 2011,Diabetes Care,2011,34  (Suppl 1):S11-S61.* 06/27/2010   Urine output improved since starting dopamine Cr back down to 2.3 from 2.5  Delight Ovens MD  Beeper [phone] Office [phone] 01/05/2012 6:35 PM

## 2012-01-05 NOTE — Progress Notes (Signed)
1 Day Post-Op Procedure(s) (LRB): FLEXIBLE BRONCHOSCOPY (N/A) THORACOTOMY MAJOR (Left) LOBECTOMY (Left) Subjective:  No complaints  Objective: Vital signs in last 24 hours: Temp:  [96.8 F (36 C)-98.4 F (36.9 C)] 97.9 F (36.6 C) (12/06 0730) Pulse Rate:  [60-75] 68  (12/06 0800) Cardiac Rhythm:  [-] Normal sinus rhythm (12/06 0600) Resp:  [11-27] 18  (12/06 0800) BP: (91-181)/(45-90) 92/45 mmHg (12/06 0800) SpO2:  [80 %-99 %] 98 % (12/06 0800) Arterial Line BP: (123-194)/(81-118) 123/118 mmHg (12/05 1800) Weight:  [146.6 kg (323 lb 3.1 oz)] 146.6 kg (323 lb 3.1 oz) (12/06 0500)  Hemodynamic parameters for last 24 hours:    Intake/Output from previous day: 12/05 0701 - 12/06 0700 In: 2579 [P.O.:60; I.V.:2119; IV Piggyback:400] Out: 2450 [Urine:1760; Blood:150; Chest Tube:540] Intake/Output this shift: Total I/O In: -  Out: 30 [Urine:30]  General appearance: slowed mentation Neurologic: intact Heart: regular rate and rhythm, S1, S2 normal, no murmur, click, rub or gallop Lungs: rhonchi LUL Extremities: edema minimal No air leak from chest tubes.  Lab Results:  Basename 01/05/12 0410 01/02/12 1329  WBC 9.2 5.1  HGB 12.3* 13.5  HCT 37.0* 39.5  PLT 204 207   BMET:  Basename 01/05/12 0410 01/02/12 1329  NA 135 138  K 5.3* 3.9  CL 101 104  CO2 23 27  GLUCOSE 139* 95  BUN 16 11  CREATININE 2.23* 0.97  CALCIUM 8.3* 9.4    PT/INR:  Basename 01/02/12 1329  LABPROT 14.0  INR 1.09   ABG    Component Value Date/Time   PHART 7.423 01/02/2012 1329   HCO3 24.6* 01/02/2012 1329   TCO2 25.8 01/02/2012 1329   ACIDBASEDEF 1.9 12/19/2011 0900   O2SAT 92.4 01/02/2012 1329   CBG (last 3)   Basename 01/05/12 0731 01/05/12 0406 01/04/12 2316  GLUCAP 128* 125* 117*   CXR: good aeration.  Assessment/Plan: S/P Procedure(s) (LRB): FLEXIBLE BRONCHOSCOPY (N/A) THORACOTOMY MAJOR (Left) LOBECTOMY (Left) Continue IS, bronchodilators. He has restrictive lung disease  due to obesity and markedly reduced diffusion capacity. Creatinine up dramatically this am to 2.2 from 0.97 preop. Will repeat to be sure it is accurate. Hx of diastolic congestive heart failure on multiple meds. BP and HR are on the low side this am so will hold these meds today. Chest tubes to 10 suction.   LOS: 1 day    Tyneshia Stivers K 01/05/2012

## 2012-01-05 NOTE — Op Note (Signed)
NAMEOBBIE, LEWALLEN NO.:  [id number]  MEDICAL RECORD NO.:  [id number]  LOCATION:  2304                         FACILITY:  [facility]  PHYSICIAN:  Evelene Croon, M.D.     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  OPERATIVE PROCEDURE: 1. Flexible fiberoptic bronchoscopy. 2. Left posterolateral thoracotomy with left lower lobectomy.  ATTENDING SURGEON:  Evelene Croon, M.D.  ASSISTANT:  Coral Ceo, PA-C  ANESTHESIA:  General endotracheal.  CLINICAL HISTORY:  This patient is a 66 year old, morbidly obese gentleman with hypertension, dyslipidemia, impaired glucose tolerance, and diastolic congestive heart failure who has a remote history of heavy smoking, and was admitted to the hospital in June 2012 with shortness of breath.  CT scan of the chest showed bilateral patchy airspace disease consistent with pulmonary edema as well as morphine fluid density in the left lower lobe suggesting possible pneumonia.  He recently presented with shortness of breath and new onset of wheezing had a repeat CT scan of the chest that shows significant increase in size of the left lower lobe and fluid density that looks like there may be a dominant mass.  He was treated with antibiotics and diuresis.  He underwent bronchoscopy by Dr. Shelle Iron and a biopsy showed atypical cells highly suggestive of adenocarcinoma but not diagnostic.  The brushings of the left lower lobe were diagnostic for adenocarcinoma.  He underwent a PET scan, which showed hypermetabolic activity within the left lower lobe adenocarcinoma with a maximum SUV of 7.1.  There were no distant metastases identified. There is no abnormal metabolic activity.  After review of the PET  scan, we obtained pulmonary function test which showed significant restrictive lung disease.  His FEV1 was 2.27 which was 72% of predicted, and FVC was 2.8 for which was 62% predicted.  Diffusion capacity was reduced to 44% predicted.  We obtained a room-air blood gas which showed a pO2 of 61.5 on room air, and pCO2 of 38.  The patient remained fairly functional and I felt the best option at this point would be to proceed with surgical therapy for this since there was no evidence of nodal or distant metastasis on PET scan.  We did a MRI of the brain which was negative for metastatic disease.  I discussed the operative procedure with the patient and his family including alternatives, benefits, and risks including, but not limited to, bleeding, blood transfusion, infection, bronchial stump complications, prolonged air leak, pleural space complications, respiratory failure, and the possibility that he may have microscopic cancer within lymph nodes requiring further therapy.  He understood all this and agreed to proceed.  OPERATIVE PROCEDURE:  The patient was seen in the preoperative holding area, and the proper patient, proper operation, proper operative site were confirmed with the patient after reviewing his chart and CT scan. The left side of the chest was signed by me.  Preoperative intravenous antibiotics were given.  He was taken back to  the operating room and placed on the table in supine position.  After induction of general endotracheal anesthesia using a single-lumen tube, flexible fiberoptic bronchoscopy was performed.  The distal trachea appeared normal.  The carina was sharp.  The right and left bronchial tree had normal segmental anatomy.  There were some thin secretions present.  The left lower lobe segmental and subsegmental bronchi appeared clear as far as I could see.  There was no sign of extrinsic compression.  Then, the bronchoscope was withdrawn from the patient.  The  single-lumen tube was converted to a double-lumen tube by Anesthesiology.  Foley catheter was placed in bladder using sterile technique.  Lower extremity pneumatic compression devices were used.  The patient was then turned in the right lateral decubitus position with the left side up.  Left side of the chest was prepped with Betadine soap and solution, draped in usual sterile manner.  Time-out was taken.  Proper patient, proper operation, proper operative site were confirmed with nursing and anesthesia staff after reviewing his CT scan again.  Then, the left chest was entered through a posterolateral thoracotomy incision.  Exposure was difficult due to the thickness of his chest wall due to his obesity.  He also had fairly large pectoralis and serratus muscles, they were partially divided.  The pleural space was entered through the fifth intercostal space.  Chest retractor was placed.  Examination of the pleural space showed no lesions on the parietal pleural surface.  The left lower lobe of the lung was essentially completely consolidated with tumor and was solid.  If this may be of procedure little more difficulty due to the noncompliance of left lower lobe.  The upper lobe appeared normal with no visible or palpable lesions.  Then, the left lower lobectomy was performed.  The inferior pulmonary ligament was divided up to the inferior pulmonary vein.  The anterior and posterior mediastinal pleura were divided over the inferior portion of the hilum.  Then, the major fissure was opened and the interlobar pulmonary artery identified.  The basilar segmental arteries were encircled with tapes and then divided using a vascular stapler.  The superior segmental artery was identified and was also encircled with a tape and divided using a vascular stapler.  Then, the junction between the upper lobe and the superior segment of the lower lobe was divided using a linear stapler.  Then, the inflow  pulmonary vein was divided using a vascular stapler.  The left lower lobe bronchus was identified. There were several lymph nodes around it, these were taken with the specimen.  The lower lobe bronchus was encircled with a tape.  A TA-30 linear stapler 4.8-mm staples was placed around the left lower lobe bronchus and closed.  The left lung was then reinflated and the left upper lobe fully inflated easily.  The lung was again deflated and the stapler fired.  The bronchus was divided distal to the stapler.  The specimen was then passed off the table and sent to pathology for permanent examination.  We did not do a frozen section on the bronchial margins since the bronchus was divided, flushed with the left main bronchus, and I did not feel the patient was a candidate for pneumonectomy.  There was no visible tumor present at the bronchial margin.  There were a couple of enlarged lymph nodes at the station 11L and these were removed and sent as a separate specimen.  I examined the subcarinal space and did not see any lymph  nodes.  I also examined the left lower paratracheal and the aortopulmonary window and did not see any lymph nodes.  Then, the chest was filled with warm saline, and the bronchial stump tested under 30 cm water pressure.  There was no air leak.  Then, two chest tubes were placed with a 28-French straight chest tube positioned anteriorly and a 32-French Blake drain positioned posteriorly.  The ribs were then reapproximated with #2 Vicryl pericostal sutures.  The muscles were reapproximated with continuous #1 Vicryl suture.  Subcutaneous tissue was closed with continuous 2-0 Vicryl and skin with a 3-0 Vicryl subcuticular skin closure.  The sponge, needle, and instrument counts were correct according to the scrub nurse.  The patient was then turned in the supine position, extubated, and transferred to the postanesthesia care unit in satisfactory and stable  condition.     Evelene Croon, M.D.    BB/MEDQ  D:  01/04/2012  T:  01/05/2012  Job:  [id number]

## 2012-01-06 ENCOUNTER — Inpatient Hospital Stay (HOSPITAL_COMMUNITY): Payer: Medicare Other

## 2012-01-06 LAB — COMPREHENSIVE METABOLIC PANEL
Albumin: 3 g/dL — ABNORMAL LOW (ref 3.5–5.2)
BUN: 19 mg/dL (ref 6–23)
Calcium: 8.2 mg/dL — ABNORMAL LOW (ref 8.4–10.5)
GFR calc Af Amer: 59 mL/min — ABNORMAL LOW (ref >90)
Glucose, Bld: 137 mg/dL — ABNORMAL HIGH (ref 70–99)
Total Protein: 7.1 g/dL (ref 6.0–8.3)

## 2012-01-06 LAB — GLUCOSE, CAPILLARY
Glucose-Capillary: 112 mg/dL — ABNORMAL HIGH (ref 70–99)
Glucose-Capillary: 134 mg/dL — ABNORMAL HIGH (ref 70–99)
Glucose-Capillary: 128 mg/dL — ABNORMAL HIGH (ref 70–99)

## 2012-01-06 LAB — CBC
HCT: 32 % — ABNORMAL LOW (ref 39.0–52.0)
Hemoglobin: 10.8 g/dL — ABNORMAL LOW (ref 13.0–17.0)
MCH: 31.8 pg (ref 26.0–34.0)
MCHC: 33.8 g/dL (ref 30.0–36.0)
RDW: 13.4 % (ref 11.5–15.5)

## 2012-01-06 LAB — TYPE AND SCREEN

## 2012-01-06 MED ORDER — ENOXAPARIN SODIUM 30 MG/0.3ML ~~LOC~~ SOLN
30.0000 mg | SUBCUTANEOUS | Status: DC
  Administered 2012-01-06 – 2012-01-07 (×2): 30 mg via SUBCUTANEOUS
  Filled 2012-01-06 (×3): qty 0.3

## 2012-01-06 NOTE — Progress Notes (Signed)
Patient ID: [name], male   DOB: February 22, 1945, 66 y.o.   MRN: [id number]                   [address]            [name] [address]          [phone]     2 Days Post-Op Procedure(s) (LRB): FLEXIBLE BRONCHOSCOPY (N/A) THORACOTOMY MAJOR (Left) LOBECTOMY (Left)  Total Length of Stay:  LOS: 2 days  BP 117/65  Pulse 81  Temp 97.6 F (36.4 C) (Oral)  Resp 18  Ht 5\' 10"  (1.778 m)  Wt 327 lb 6.1 oz (148.5 kg)  BMI 46.97 kg/m2  SpO2 97%  .Intake/Output      12/07 0701 - 12/08 0700   P.O. 225   I.V. (mL/kg) 1278.4 (8.6)   IV Piggyback    Total Intake(mL/kg) 1503.4 (10.1)   Urine (mL/kg/hr) 1775 (1)   Chest Tube 80   Total Output 1855   Net -351.6            . sodium chloride Stopped (01/06/12 1700)  . dextrose 5 % and 0.45% NaCl 80 mL/hr at 01/06/12 1900  . DOPamine 3 mcg/kg/min (01/06/12 1900)     Lab Results  Component Value Date   WBC 7.3 01/06/2012   HGB 10.8* 01/06/2012   HCT 32.0* 01/06/2012   PLT 178 01/06/2012   GLUCOSE 137* 01/06/2012   CHOL 137 06/28/2010   TRIG 100 06/28/2010   HDL 31* 06/28/2010   LDLCALC  Value: 86        Total Cholesterol/HDL:CHD Risk Coronary Heart Disease Risk Table                     Men   Women  1/2 Average Risk   3.4   3.3  Average Risk       5.0   4.4  2 X Average Risk   9.6   7.1  3 X Average Risk  23.4   11.0        Use the calculated Patient Ratio above and the CHD Risk Table to determine the patient's CHD Risk.        ATP III CLASSIFICATION (LDL):  <100     mg/dL   Optimal  213-086  mg/dL   Near or Above                    Optimal  130-159  mg/dL   Borderline  578-469  mg/dL   High  >629     mg/dL   Very High 06/27/4130   ALT 18 01/06/2012   AST 39* 01/06/2012   NA 131* 01/06/2012   K 4.5 01/06/2012   CL 98 01/06/2012   CREATININE 1.40* 01/06/2012   BUN 19 01/06/2012   CO2 23 01/06/2012   TSH 1.520 06/27/2010   INR 1.09 01/02/2012   HGBA1C  Value: 5.9 (NOTE)                                                                        According to the ADA Clinical Practice Recommendations for 2011, when HbA1c is used as a screening test:   >=  6.5%   Diagnostic of Diabetes Mellitus           (if abnormal result  is confirmed)  5.7-6.4%   Increased risk of developing Diabetes Mellitus  References:Diagnosis and Classification of Diabetes Mellitus,Diabetes Care,2011,34(Suppl 1):S62-S69 and Standards of Medical Care in         Diabetes - 2011,Diabetes Care,2011,34  (Suppl 1):S11-S61.* 06/27/2010   Cr decreased to 1.4 Stable day  Delight Ovens MD  Beeper [phone] Office [phone] 01/06/2012 7:05 PM

## 2012-01-06 NOTE — Progress Notes (Signed)
Patient ID: [name], male   DOB: [birth year], 66 y.o.   MRN: [id number] TCTS DAILY PROGRESS NOTE                   [address]            [name] [address]          [phone]      2 Days Post-Op Procedure(s) (LRB): FLEXIBLE BRONCHOSCOPY (N/A) THORACOTOMY MAJOR (Left) LOBECTOMY (Left)  Total Length of Stay:  LOS: 2 days   Subjective: Feels better today, walked in unit   Objective: Vital signs in last 24 hours: Temp:  [98 F (36.7 C)-101.6 F (38.7 C)] 98 F (36.7 C) (12/07 0745) Pulse Rate:  [42-84] 75  (12/07 0700) Cardiac Rhythm:  [-] Normal sinus rhythm (12/07 0400) Resp:  [13-24] 15  (12/07 0800) BP: (91-157)/(49-73) 142/63 mmHg (12/07 0700) SpO2:  [54 %-100 %] 98 % (12/07 0800) Weight:  [327 lb 6.1 oz (148.5 kg)] 327 lb 6.1 oz (148.5 kg) (12/07 0500)  Filed Weights   01/05/12 0500 01/06/12 0500  Weight: 323 lb 3.1 oz (146.6 kg) 327 lb 6.1 oz (148.5 kg)    Weight change: 4 lb 3 oz (1.9 kg)   Hemodynamic parameters for last 24 hours:    Intake/Output from previous day: 12/06 0701 - 12/07 0700 In: 3921.3 [P.O.:710; I.V.:2611.3; IV Piggyback:600] Out: 2278 [Urine:2018; Chest Tube:260]  Intake/Output this shift: Total I/O In: -  Out: 75 [Urine:75]  Current Meds: Scheduled Meds:   . [COMPLETED] acetaminophen  1,000 mg Intravenous Q6H  . [COMPLETED] albumin human  12.5 g Intravenous Once  . [COMPLETED] albumin human  12.5 g Intravenous Once  . [EXPIRED] albumin human      . bisacodyl  10 mg Oral Daily  . [EXPIRED] DOPamine      . HYDROmorphone PCA 0.3 mg/mL   Intravenous Q4H  . insulin aspart  0-24 Units Subcutaneous Q4H  . levalbuterol  0.63 mg Nebulization Q6H  . [DISCONTINUED] carvedilol  3.125 mg Oral Q breakfast  . [DISCONTINUED] furosemide  80 mg Oral BID  . [DISCONTINUED] potassium chloride SA  20 mEq Oral BID  . [DISCONTINUED] ramipril  5 mg Oral Daily  . [DISCONTINUED] spironolactone  25 mg Oral Daily   Continuous  Infusions:   . sodium chloride 20 mL/hr at 01/05/12 1600  . dextrose 5 % and 0.45% NaCl 80 mL/hr at 01/05/12 1738  . DOPamine 3 mcg/kg/min (01/05/12 1900)   PRN Meds:.diphenhydrAMINE, diphenhydrAMINE, naloxone, ondansetron (ZOFRAN) IV, ondansetron (ZOFRAN) IV, [EXPIRED] oxyCODONE, oxyCODONE-acetaminophen, potassium chloride, senna-docusate, sodium chloride, traMADol, [DISCONTINUED] ketorolac  General appearance: alert, cooperative and no distress Neurologic: intact Heart: regular rate and rhythm, S1, S2 normal, no murmur, click, rub or gallop and normal apical impulse Lungs: rhonchi bilaterally Abdomen: soft, non-tender; bowel sounds normal; no masses,  no organomegaly Extremities: extremities normal, atraumatic, no cyanosis or edema and Homans sign is negative, no sign of DVT Wound: no air leak  Lab Results: CBC: Basename 01/06/12 0348 01/05/12 0410  WBC 7.3 9.2  HGB 10.8* 12.3*  HCT 32.0* 37.0*  PLT 178 204   BMET:  Basename 01/06/12 0348 01/05/12 1554  NA 131* 132*  K 4.5 4.9  CL 98 98  CO2 23 24  GLUCOSE 137* 131*  BUN 19 22  CREATININE 1.40* 2.36*  CALCIUM 8.2* 8.3*    PT/INR: No results found for this basename: LABPROT,INR in the last 72 hours Radiology: Dg Chest [facility]  1 View  01/06/2012  *RADIOLOGY REPORT*  Clinical Data: Left chest tube.  Status post left lower lobectomy.  PORTABLE CHEST - 1 VIEW  Comparison: Plain film chest 01/04/2012 and 01/05/2012.  Findings: Right IJ catheter and left chest tube remain in place. No pneumothorax identified.    Basilar atelectasis, worse on the left, appears unchanged. There is cardiomegaly.  IMPRESSION: No interval change in bibasilar atelectasis.  Negative for pneumothorax with a left chest tube in place.   Original Report Authenticated By: Holley Dexter, M.D.    Dg Chest Port 1 View  01/05/2012  *RADIOLOGY REPORT*  Clinical Data: Status post left thoracotomy.  PORTABLE CHEST - 1 VIEW  Comparison: Chest 01/04/2012.  Findings:  Right IJ catheter into left chest tubes remain in place. No pneumothorax is identified.  The chest is better expanded on today's study with improved aeration, particularly on the right. There appear to be very small bilateral pleural effusions. Cardiomegaly without edema is again noted.  IMPRESSION:  1.  Negative for pneumothorax with a left chest tube in place. 2.  Improved pulmonary expansion with decreased atelectasis.  No new abnormality.   Original Report Authenticated By: Holley Dexter, M.D.    Dg Chest Portable 1 View  01/04/2012  *RADIOLOGY REPORT*  Clinical Data: Postop  PORTABLE CHEST - 1 VIEW  Comparison: Chest x-ray of 01/02/2012  Findings: The mass in the left lower lobe has been resected and left chest tubes are present.  There is some volume loss on the left but no definite pneumothorax is seen.  Atelectasis at the right lung base is noted.  A right IJ central venous line tip overlies the mid SVC.  Cardiomegaly is stable.  IMPRESSION: Postop changes on the left with left chest tubes and basilar atelectasis.  No pneumothorax.  Stable cardiomegaly.   Original Report Authenticated By: Dwyane Dee, M.D.      Assessment/Plan: S/P Procedure(s) (LRB): FLEXIBLE BRONCHOSCOPY (N/A) THORACOTOMY MAJOR (Left) LOBECTOMY (Left) Mobilize Diuresis Diabetes control Continue foley due to strict I&O, patient in ICU and urinary output monitoring Renal function improving cr now 1.4, will continue dopamine 12-24 hours more  Work on pulmonary toilet    [name] 01/06/2012 8:21 AM

## 2012-01-07 ENCOUNTER — Inpatient Hospital Stay (HOSPITAL_COMMUNITY): Payer: Medicare Other

## 2012-01-07 LAB — CBC
HCT: 31.4 % — ABNORMAL LOW (ref 39.0–52.0)
Hemoglobin: 10.8 g/dL — ABNORMAL LOW (ref 13.0–17.0)
MCH: 32.6 pg (ref 26.0–34.0)
MCHC: 34.4 g/dL (ref 30.0–36.0)
MCV: 94.9 fL (ref 78.0–100.0)
Platelets: 205 10*3/uL (ref 150–400)
RBC: 3.31 MIL/uL — ABNORMAL LOW (ref 4.22–5.81)
RDW: 13.3 % (ref 11.5–15.5)
WBC: 5.9 10*3/uL (ref 4.0–10.5)

## 2012-01-07 LAB — GLUCOSE, CAPILLARY
Glucose-Capillary: 107 mg/dL — ABNORMAL HIGH (ref 70–99)
Glucose-Capillary: 111 mg/dL — ABNORMAL HIGH (ref 70–99)
Glucose-Capillary: 124 mg/dL — ABNORMAL HIGH (ref 70–99)
Glucose-Capillary: 125 mg/dL — ABNORMAL HIGH (ref 70–99)

## 2012-01-07 LAB — BASIC METABOLIC PANEL
BUN: 13 mg/dL (ref 6–23)
CO2: 26 mEq/L (ref 19–32)
Calcium: 8.7 mg/dL (ref 8.4–10.5)
Chloride: 99 mEq/L (ref 96–112)
Creatinine, Ser: 0.85 mg/dL (ref 0.50–1.35)
GFR calc Af Amer: >90 mL/min (ref >90)
GFR calc non Af Amer: 89 mL/min — ABNORMAL LOW (ref >90)
Glucose, Bld: 128 mg/dL — ABNORMAL HIGH (ref 70–99)
Potassium: 4.5 mEq/L (ref 3.5–5.1)
Sodium: 131 mEq/L — ABNORMAL LOW (ref 135–145)

## 2012-01-07 MED ORDER — ENOXAPARIN SODIUM 40 MG/0.4ML ~~LOC~~ SOLN
40.0000 mg | SUBCUTANEOUS | Status: DC
  Administered 2012-01-08: 40 mg via SUBCUTANEOUS
  Filled 2012-01-07 (×2): qty 0.4

## 2012-01-07 NOTE — Progress Notes (Signed)
Patient ID: [name], male   DOB: Oct 05, 1945, 66 y.o.   MRN: [id number] TCTS DAILY PROGRESS NOTE                   [address]            [place] [address]          [phone]      3 Days Post-Op Procedure(s) (LRB): FLEXIBLE BRONCHOSCOPY (N/A) THORACOTOMY MAJOR (Left) LOBECTOMY (Left)  Total Length of Stay:  LOS: 3 days   Subjective: Feels better walked around unit today  Objective: Vital signs in last 24 hours: Temp:  [97.6 F (36.4 C)-98.6 F (37 C)] 98 F (36.7 C) (12/08 0812) Pulse Rate:  [63-88] 80  (12/08 0600) Cardiac Rhythm:  [-] Normal sinus rhythm (12/08 0300) Resp:  [11-25] 25  (12/08 0600) BP: (109-152)/(49-79) 112/54 mmHg (12/08 0500) SpO2:  [91 %-100 %] 94 % (12/08 0758) Weight:  [328 lb 4.2 oz (148.9 kg)] 328 lb 4.2 oz (148.9 kg) (12/08 0600)  Filed Weights   01/05/12 0500 01/06/12 0500 01/07/12 0600  Weight: 323 lb 3.1 oz (146.6 kg) 327 lb 6.1 oz (148.5 kg) 328 lb 4.2 oz (148.9 kg)    Weight change: 14.1 oz (0.4 kg)   Hemodynamic parameters for last 24 hours:    Intake/Output from previous day: 12/07 0701 - 12/08 0700 In: 2416.2 [P.O.:225; I.V.:2191.2] Out: 2930 [Urine:2700; Chest Tube:230]  Intake/Output this shift:    Current Meds: Scheduled Meds:   . bisacodyl  10 mg Oral Daily  . enoxaparin  30 mg Subcutaneous Q24H  . HYDROmorphone PCA 0.3 mg/mL   Intravenous Q4H  . insulin aspart  0-24 Units Subcutaneous Q4H  . levalbuterol  0.63 mg Nebulization Q6H   Continuous Infusions:   . sodium chloride Stopped (01/06/12 1700)  . dextrose 5 % and 0.45% NaCl 80 mL/hr at 01/07/12 0600  . DOPamine 3 mcg/kg/min (01/07/12 0600)   PRN Meds:.diphenhydrAMINE, diphenhydrAMINE, naloxone, ondansetron (ZOFRAN) IV, ondansetron (ZOFRAN) IV, oxyCODONE-acetaminophen, potassium chloride, senna-docusate, sodium chloride, traMADol  General appearance: alert, cooperative and morbidly obese Neurologic: intact Heart: regular rate and  rhythm, S1, S2 normal, no murmur, click, rub or gallop and normal apical impulse Lungs: rhonchi bilaterally Abdomen: soft, non-tender; bowel sounds normal; no masses,  no organomegaly Extremities: extremities normal, atraumatic, no cyanosis or edema and Homans sign is negative, no sign of DVT Wound: no air leak today  Lab Results: CBC: Basename 01/07/12 0350 01/06/12 0348  WBC 5.9 7.3  HGB 10.8* 10.8*  HCT 31.4* 32.0*  PLT 205 178   BMET:  Basename 01/07/12 0350 01/06/12 0348  NA 131* 131*  K 4.5 4.5  CL 99 98  CO2 26 23  GLUCOSE 128* 137*  BUN 13 19  CREATININE 0.85 1.40*  CALCIUM 8.7 8.2*    PT/INR: No results found for this basename: LABPROT,INR in the last 72 hours Radiology: Dg Chest Port 1 View  01/07/2012  *RADIOLOGY REPORT*  Clinical Data: 66 year old male, postoperative - left lower lobectomy for adenocarcinoma.  PORTABLE CHEST - 1 VIEW  Comparison: 01/06/2012 and multiple prior chest radiographs  Findings: Left sided postoperative changes are again identified with left thoracostomy tubes and right IJ central venous catheter with tip overlying the mid SVC again noted. There is a 5-10% left apical pneumothorax now identified. Left lower lung consolidation/atelectasis has slightly increased. Pulmonary vascular congestion is present.  IMPRESSION: Postoperative changes from left lower lobectomy with small (5-10%) left apical  pneumothorax now identified.  Slightly increasing left lower lung atelectasis/consolidation.   Original Report Authenticated By: Harmon Pier, M.D.    Dg Chest Port 1 View  01/06/2012  *RADIOLOGY REPORT*  Clinical Data: Left chest tube.  Status post left lower lobectomy.  PORTABLE CHEST - 1 VIEW  Comparison: Plain film chest 01/04/2012 and 01/05/2012.  Findings: Right IJ catheter and left chest tube remain in place. No pneumothorax identified.    Basilar atelectasis, worse on the left, appears unchanged. There is cardiomegaly.  IMPRESSION: No interval change in  bibasilar atelectasis.  Negative for pneumothorax with a left chest tube in place.   Original Report Authenticated By: Holley Dexter, M.D.      Assessment/Plan: S/P Procedure(s) (LRB): FLEXIBLE BRONCHOSCOPY (N/A) THORACOTOMY MAJOR (Left) LOBECTOMY (Left) Mobilize  Slightly increasing left lower lung atelectasis/consolidation will leave ct in  Renal function returned to normal cr level will wean off and d/c dopamine   [name] B 01/07/2012 8:28 AM

## 2012-01-08 ENCOUNTER — Inpatient Hospital Stay (HOSPITAL_COMMUNITY): Payer: Medicare Other

## 2012-01-08 LAB — BASIC METABOLIC PANEL
BUN: 14 mg/dL (ref 6–23)
CO2: 27 mEq/L (ref 19–32)
Calcium: 8.9 mg/dL (ref 8.4–10.5)
Chloride: 97 mEq/L (ref 96–112)
Creatinine, Ser: 0.85 mg/dL (ref 0.50–1.35)
GFR calc Af Amer: >90 mL/min (ref >90)
GFR calc non Af Amer: 89 mL/min — ABNORMAL LOW (ref >90)
Glucose, Bld: 110 mg/dL — ABNORMAL HIGH (ref 70–99)
Potassium: 4.3 mEq/L (ref 3.5–5.1)
Sodium: 132 mEq/L — ABNORMAL LOW (ref 135–145)

## 2012-01-08 LAB — CBC
HCT: 30.3 % — ABNORMAL LOW (ref 39.0–52.0)
Hemoglobin: 10.3 g/dL — ABNORMAL LOW (ref 13.0–17.0)
MCH: 32.2 pg (ref 26.0–34.0)
MCHC: 34 g/dL (ref 30.0–36.0)
MCV: 94.7 fL (ref 78.0–100.0)
Platelets: 201 10*3/uL (ref 150–400)
RBC: 3.2 MIL/uL — ABNORMAL LOW (ref 4.22–5.81)
RDW: 13.4 % (ref 11.5–15.5)
WBC: 5 10*3/uL (ref 4.0–10.5)

## 2012-01-08 LAB — GLUCOSE, CAPILLARY
Glucose-Capillary: 97 mg/dL (ref 70–99)
Glucose-Capillary: 95 mg/dL (ref 70–99)
Glucose-Capillary: 117 mg/dL — ABNORMAL HIGH (ref 70–99)

## 2012-01-08 MED ORDER — ENOXAPARIN SODIUM 60 MG/0.6ML ~~LOC~~ SOLN
60.0000 mg | Freq: Every day | SUBCUTANEOUS | Status: DC
  Administered 2012-01-09 – 2012-01-11 (×3): 60 mg via SUBCUTANEOUS
  Filled 2012-01-08 (×4): qty 0.6

## 2012-01-08 MED ORDER — ENOXAPARIN SODIUM 30 MG/0.3ML ~~LOC~~ SOLN
20.0000 mg | SUBCUTANEOUS | Status: AC
  Administered 2012-01-08: 20 mg via SUBCUTANEOUS
  Filled 2012-01-08: qty 0.2

## 2012-01-08 MED ORDER — FUROSEMIDE 40 MG PO TABS
40.0000 mg | ORAL_TABLET | Freq: Two times a day (BID) | ORAL | Status: DC
  Administered 2012-01-08 (×2): 40 mg via ORAL
  Filled 2012-01-08 (×4): qty 1
  Filled 2012-01-08: qty 2

## 2012-01-08 MED ORDER — ENOXAPARIN SODIUM 100 MG/ML ~~LOC~~ SOLN: 75.0000 mg | Freq: Every day | SUBCUTANEOUS | Status: DC

## 2012-01-08 MED ORDER — TAMSULOSIN HCL 0.4 MG PO CAPS
0.4000 mg | ORAL_CAPSULE | Freq: Every day | ORAL | Status: DC
  Administered 2012-01-08 – 2012-01-12 (×5): 0.4 mg via ORAL
  Filled 2012-01-08 (×5): qty 1

## 2012-01-08 MED ORDER — ENOXAPARIN SODIUM 40 MG/0.4ML ~~LOC~~ SOLN: 35.0000 mg | SUBCUTANEOUS | Status: DC

## 2012-01-08 MED ORDER — GUAIFENESIN ER 600 MG PO TB12
1200.0000 mg | ORAL_TABLET | Freq: Two times a day (BID) | ORAL | Status: DC
  Administered 2012-01-08 – 2012-01-12 (×9): 1200 mg via ORAL
  Filled 2012-01-08 (×10): qty 2

## 2012-01-08 NOTE — Progress Notes (Signed)
4 Days Post-Op Procedure(s) (LRB): FLEXIBLE BRONCHOSCOPY (N/A) THORACOTOMY MAJOR (Left) LOBECTOMY (Left) Subjective: Can't pass urine. He was cathed for 700 cc overnight. Still having a fair amount of pain.  Objective: Vital signs in last 24 hours: Temp:  [97.7 F (36.5 C)-99.7 F (37.6 C)] 98.3 F (36.8 C) (12/09 0741) Pulse Rate:  [67-87] 73  (12/09 0800) Cardiac Rhythm:  [-] Normal sinus rhythm (12/09 0600) Resp:  [12-22] 18  (12/09 0800) BP: (77-138)/(43-85) 113/48 mmHg (12/09 0800) SpO2:  [91 %-100 %] 100 % (12/09 0800) Weight:  [148 kg (326 lb 4.5 oz)] 148 kg (326 lb 4.5 oz) (12/09 0600)  Hemodynamic parameters for last 24 hours:    Intake/Output from previous day: 12/08 0701 - 12/09 0700 In: 576.4 [I.V.:576.4] Out: 1950 [Urine:1820; Chest Tube:130] Intake/Output this shift:    General appearance: alert and cooperative Heart: regular rate and rhythm, S1, S2 normal, no murmur, click, rub or gallop Lungs: diminished breath sounds base - left Extremities: edema moderate leg edema.  Lab Results:  Basename 01/08/12 0415 01/07/12 0350  WBC 5.0 5.9  HGB 10.3* 10.8*  HCT 30.3* 31.4*  PLT 201 205   BMET:  Basename 01/08/12 0415 01/07/12 0350  NA 132* 131*  K 4.3 4.5  CL 97 99  CO2 27 26  GLUCOSE 110* 128*  BUN 14 13  CREATININE 0.85 0.85  CALCIUM 8.9 8.7    PT/INR: No results found for this basename: LABPROT,INR in the last 72 hours ABG    Component Value Date/Time   PHART 7.423 01/02/2012 1329   HCO3 24.6* 01/02/2012 1329   TCO2 25.8 01/02/2012 1329   ACIDBASEDEF 1.9 12/19/2011 0900   O2SAT 92.4 01/02/2012 1329   CBG (last 3)   Basename 01/08/12 0743 01/08/12 0317 01/08/12 0016  GLUCAP 97 109* 108*    Assessment/Plan: S/P Procedure(s) (LRB): FLEXIBLE BRONCHOSCOPY (N/A) THORACOTOMY MAJOR (Left) LOBECTOMY (Left)  DC chest tube, keep Bard drain in today.  Urinary retention:  No prior hx of problems. Will insert foley and keep it in for a couple  more days. Start Flomax.  Resume diuretic for hx of diastolic dysfunction and CHF. Wait to resume Coreg and ACE I.  Followup on path.   LOS: 4 days    [name] K 01/08/2012

## 2012-01-08 NOTE — Progress Notes (Signed)
Just back from 3rd walk of the day.  Foley replaced for urinary retention  BP 112/53  Pulse 81  Temp 98.2 F (36.8 C) (Oral)  Resp 17  Ht 5\' 10"  (1.778 m)  Wt 326 lb 4.5 oz (148 kg)  BMI 46.82 kg/m2  SpO2 93%   Intake/Output Summary (Last 24 hours) at 01/08/12 1737 Last data filed at 01/08/12 1600  Gross per 24 hour  Intake 1110.33 ml  Output   2775 ml  Net -1664.67 ml    Stable, continue current care.

## 2012-01-08 NOTE — Progress Notes (Signed)
In to see patient at this time. Patient had foley removed at 1400 during day shift. Patient at 2000 was prompted and assisted to try and void. Patient attempted but was unable to urinate. Reassessed in 2 hours and attempted to assist patient again to void, at 2300 patient still unable to void. Bladder scan revealed 650 at this time. At this time patient was straight cathed, 700cc of urine resulted. Patient tolerated the procedure well, will continue to monitor the patient closely.

## 2012-01-09 ENCOUNTER — Inpatient Hospital Stay (HOSPITAL_COMMUNITY): Payer: Medicare Other

## 2012-01-09 LAB — GLUCOSE, CAPILLARY
Glucose-Capillary: 107 mg/dL — ABNORMAL HIGH (ref 70–99)
Glucose-Capillary: 116 mg/dL — ABNORMAL HIGH (ref 70–99)
Glucose-Capillary: 129 mg/dL — ABNORMAL HIGH (ref 70–99)
Glucose-Capillary: 141 mg/dL — ABNORMAL HIGH (ref 70–99)

## 2012-01-09 LAB — BASIC METABOLIC PANEL
GFR calc non Af Amer: 85 mL/min — ABNORMAL LOW (ref >90)
Glucose, Bld: 125 mg/dL — ABNORMAL HIGH (ref 70–99)
Potassium: 4 mEq/L (ref 3.5–5.1)
Sodium: 133 mEq/L — ABNORMAL LOW (ref 135–145)

## 2012-01-09 MED ORDER — FUROSEMIDE 80 MG PO TABS
80.0000 mg | ORAL_TABLET | Freq: Two times a day (BID) | ORAL | Status: DC
  Administered 2012-01-09 – 2012-01-12 (×7): 80 mg via ORAL
  Filled 2012-01-09 (×9): qty 1

## 2012-01-09 MED ORDER — INSULIN ASPART 100 UNIT/ML ~~LOC~~ SOLN
0.0000 [IU] | SUBCUTANEOUS | Status: DC
  Administered 2012-01-09 – 2012-01-10 (×2): 2 [IU] via SUBCUTANEOUS

## 2012-01-09 MED ORDER — OXYCODONE HCL 5 MG PO TABS
10.0000 mg | ORAL_TABLET | ORAL | Status: DC | PRN
  Administered 2012-01-09: 10 mg via ORAL
  Filled 2012-01-09 (×2): qty 2

## 2012-01-09 MED ORDER — POTASSIUM CHLORIDE CRYS ER 20 MEQ PO TBCR
40.0000 meq | EXTENDED_RELEASE_TABLET | Freq: Every day | ORAL | Status: DC
  Administered 2012-01-09 – 2012-01-12 (×4): 40 meq via ORAL
  Filled 2012-01-09 (×4): qty 2

## 2012-01-09 NOTE — Progress Notes (Addendum)
5 Days Post-Op Procedure(s) (LRB): FLEXIBLE BRONCHOSCOPY (N/A) THORACOTOMY MAJOR (Left) LOBECTOMY (Left) Subjective: Feeling better. Pain under control  Objective: Vital signs in last 24 hours: Temp:  [97.6 F (36.4 C)-98.4 F (36.9 C)] 97.6 F (36.4 C) (12/10 0801) Pulse Rate:  [68-95] 80  (12/10 0734) Cardiac Rhythm:  [-] Normal sinus rhythm (12/10 0700) Resp:  [11-24] 17  (12/10 0734) BP: (99-156)/(44-82) 104/59 mmHg (12/10 0700) SpO2:  [90 %-99 %] 99 % (12/10 0734) Weight:  [145.3 kg (320 lb 5.3 oz)] 145.3 kg (320 lb 5.3 oz) (12/10 0600)  Hemodynamic parameters for last 24 hours:    Intake/Output from previous day: 12/09 0701 - 12/10 0700 In: 1410.3 [P.O.:840; I.V.:570.3] Out: 4125 [Urine:3925; Chest Tube:200] Intake/Output this shift:    General appearance: alert and cooperative Heart: regular rate and rhythm, S1, S2 normal, no murmur, click, rub or gallop Lungs: clear to auscultation bilaterally Extremities: edema moderate lower extremity edema Wound: incision ok  Lab Results:  Basename 01/08/12 0415 01/07/12 0350  WBC 5.0 5.9  HGB 10.3* 10.8*  HCT 30.3* 31.4*  PLT 201 205   BMET:  Basename 01/09/12 0404 01/08/12 0415  NA 133* 132*  K 4.0 4.3  CL 96 97  CO2 30 27  GLUCOSE 125* 110*  BUN 19 14  CREATININE 0.94 0.85  CALCIUM 9.1 8.9    PT/INR: No results found for this basename: LABPROT,INR in the last 72 hours ABG    Component Value Date/Time   PHART 7.423 01/02/2012 1329   HCO3 24.6* 01/02/2012 1329   TCO2 25.8 01/02/2012 1329   ACIDBASEDEF 1.9 12/19/2011 0900   O2SAT 92.4 01/02/2012 1329   CBG (last 3)   Basename 01/09/12 0757 01/09/12 0458 01/09/12 0034  GLUCAP 97 107* 116*   Pathology:  T3, N0  Well-diff adenocarcinoma.  Assessment/Plan: S/P Procedure(s) (LRB): FLEXIBLE BRONCHOSCOPY (N/A) THORACOTOMY MAJOR (Left) LOBECTOMY (Left) Mobilize Diuresis Diabetes control follow up on cxr this am and probably remove chest tube. Then  transfer. Continue pulmonary toilet. Postop urinary retention: continue foley today.  LOS: 5 days    Calista Crain K 01/09/2012

## 2012-01-10 ENCOUNTER — Inpatient Hospital Stay (HOSPITAL_COMMUNITY): Payer: Medicare Other

## 2012-01-10 LAB — BASIC METABOLIC PANEL
Calcium: 9.4 mg/dL (ref 8.4–10.5)
GFR calc non Af Amer: 70 mL/min — ABNORMAL LOW (ref >90)
Glucose, Bld: 118 mg/dL — ABNORMAL HIGH (ref 70–99)
Sodium: 136 mEq/L (ref 135–145)

## 2012-01-10 LAB — GLUCOSE, CAPILLARY
Glucose-Capillary: 111 mg/dL — ABNORMAL HIGH (ref 70–99)
Glucose-Capillary: 124 mg/dL — ABNORMAL HIGH (ref 70–99)
Glucose-Capillary: 93 mg/dL (ref 70–99)

## 2012-01-10 MED ORDER — INSULIN ASPART 100 UNIT/ML ~~LOC~~ SOLN
0.0000 [IU] | Freq: Three times a day (TID) | SUBCUTANEOUS | Status: DC
  Administered 2012-01-11: 2 [IU] via SUBCUTANEOUS

## 2012-01-10 MED ORDER — SODIUM CHLORIDE 0.9 % IJ SOLN
10.0000 mL | INTRAMUSCULAR | Status: DC | PRN
  Administered 2012-01-10: 20 mL
  Administered 2012-01-10: 10 mL

## 2012-01-10 NOTE — Progress Notes (Addendum)
                    [address]            [place] [address]          [phone]     6 Days Post-Op Procedure(s) (LRB): FLEXIBLE BRONCHOSCOPY (N/A) THORACOTOMY MAJOR (Left) LOBECTOMY (Left)  Subjective: Feels well, a little cough, but otherwise stable.   Objective: Vital signs in last 24 hours: Patient Vitals for the past 24 hrs:  BP Temp Temp src Pulse Resp SpO2  01/10/12 0731 - - - - - 98 %  01/10/12 0431 101/52 mmHg 97.7 F (36.5 C) Oral 69  19  100 %  01/10/12 0138 - - - - - 100 %  01/09/12 2022 - - - - - 100 %  01/09/12 2013 120/51 mmHg 98.3 F (36.8 C) Oral 71  18  100 %  01/09/12 1552 100/55 mmHg 98 F (36.7 C) Oral 84  18  99 %  01/09/12 1500 - - - 115  31  92 %  01/09/12 1400 118/70 mmHg - - 70  16  100 %  01/09/12 1355 - - - - - 98 %  01/09/12 1300 90/64 mmHg - - 69  18  98 %  01/09/12 1249 - 97.5 F (36.4 C) Oral - - -  01/09/12 1200 - - - 66  18  98 %  01/09/12 1100 - - - 67  15  97 %  01/09/12 1000 110/50 mmHg - - 79  11  96 %  01/09/12 0900 104/61 mmHg - - 82  16  98 %   Current Weight  01/09/12 320 lb 5.3 oz (145.3 kg)     Intake/Output from previous day: 12/10 0701 - 12/11 0700 In: 1000 [P.O.:840; I.V.:160] Out: 2956 [Urine:2925; Stool:1; Chest Tube:30]  CBGs 129-141-124-108-118-153  PHYSICAL EXAM:  Heart: RRR Lungs: Slightly decreased on L, overall clear Wound: Clean and dry    Lab Results: CBC: Basename 01/08/12 0415  WBC 5.0  HGB 10.3*  HCT 30.3*  PLT 201   BMET:  Basename 01/10/12 0500 01/09/12 0404  NA 136 133*  K 3.8 4.0  CL 96 96  CO2 29 30  GLUCOSE 118* 125*  BUN 24* 19  CREATININE 1.08 0.94  CALCIUM 9.4 9.1    PT/INR: No results found for this basename: LABPROT,INR in the last 72 hours  CXR: no obvious ptx  Assessment/Plan: S/P Procedure(s) (LRB): FLEXIBLE BRONCHOSCOPY (N/A) THORACOTOMY MAJOR (Left) LOBECTOMY (Left) Pulm- still on 2L O2.  Continue pulm toilet, IS, wean O2 today. GU-  urinary retention, now on Flomax.  Will attempt voiding trial today. CBGs stable.  Continue SSI. Ambulate in halls. Hopefully home soon when off O2 and voiding.   LOS: 6 days    COLLINS,GINA H 01/10/2012    Chart reviewed, patient examined, agree with above. CXR ok and breath sounds good. His foley is out and has passed some urine this am. He still has a lot of lower extremity edema and needs continue diuresis. His oxygen level was low preop on room air with low diffusion capacity so he will likely need home oxygen for a while.

## 2012-01-10 NOTE — Care Management Note (Addendum)
    Page 1 of 1   01/12/2012     3:22:17 PM   CARE MANAGEMENT NOTE 01/12/2012  Patient:  [name], [name]   Account Number:  [id number]  Date Initiated:  01/05/2012  Documentation initiated by:  Alvira Philips Assessment:   66 yr-old male adm with dx of LLL adenocarcinoma s/p LL lobectomy     Action/Plan:   PTA, PT INDEPENDENT, LIVES WITH SON AND DTR IN LAW, WHO WILL PROVIDE CARE AT DC.  WILL FOLLOW FOR HOME NEEDS.   Anticipated DC Date:  01/12/2012   Anticipated DC Plan:  HOME W HOME HEALTH SERVICES      DC Planning Services  CM consult      Choice offered to / List presented to:     DME arranged  [facility]      DME agency  [facility]        Status of service:  Completed, signed off Medicare Important Message given?   (If response is "NO", the following Medicare IM given date fields will be blank) Date Medicare IM given:   Date Additional Medicare IM given:    Discharge Disposition:  HOME/SELF CARE  Per UR Regulation:  Reviewed for med. necessity/level of care/duration of stay  If discussed at Long Length of Stay Meetings, dates discussed:    CommentsRosalita Chessman [phone] 01/12/12  PT DESATS WITH AMBULATION; WILL NEED HOME O2, AND ORDERS IN EPIC.  REFERRAL TO [facility] FOR HOME O2 SET UP. PORTABLE O2 TANK DELIVERED TO PT'S ROOM PRIOR TO DC.

## 2012-01-11 ENCOUNTER — Inpatient Hospital Stay (HOSPITAL_COMMUNITY): Payer: Medicare Other

## 2012-01-11 LAB — GLUCOSE, CAPILLARY
Glucose-Capillary: 101 mg/dL — ABNORMAL HIGH (ref 70–99)
Glucose-Capillary: 99 mg/dL (ref 70–99)
Glucose-Capillary: 101 mg/dL — ABNORMAL HIGH (ref 70–99)

## 2012-01-11 MED ORDER — OXYCODONE HCL 10 MG PO TABS: 10.0000 mg | ORAL_TABLET | ORAL | Status: DC | PRN

## 2012-01-11 MED ORDER — TAMSULOSIN HCL 0.4 MG PO CAPS: 0.4000 mg | ORAL_CAPSULE | Freq: Every day | ORAL | Status: DC

## 2012-01-11 NOTE — Progress Notes (Signed)
Pt ambulated about 450 ft with O2 at 2 LPM/[place],tolerated well,O2 saturaton post ambulation is 96%.Aren Pryde RN
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
## 2012-01-11 NOTE — Progress Notes (Signed)
Walked on RA approx 458ft.  Pt o2 sat 83%.  Did recover to 95% on his own with rest.  Will walk on o2 next time.

## 2012-01-11 NOTE — Progress Notes (Signed)
Pt walked the same 488ft as earlier except with o2 this time-2L/[place].  Sat dropped to 87 with 2L.  Pt again recovered quickly with rest within minutes.

## 2012-01-11 NOTE — Progress Notes (Signed)
                    [address]            [place] [address]          [phone]     7 Days Post-Op Procedure(s) (LRB): FLEXIBLE BRONCHOSCOPY (N/A) THORACOTOMY MAJOR (Left) LOBECTOMY (Left)  Subjective: Feels well, no complaints.  Breathing stable, voiding without difficulty.  Sats 97-100% on 2L.    Objective: Vital signs in last 24 hours: Patient Vitals for the past 24 hrs:  BP Temp Temp src Pulse Resp SpO2 Weight  01/11/12 0435 119/76 mmHg 99.4 F (37.4 C) Oral 71  20  100 % 314 lb 3.2 oz (142.52 kg)  01/10/12 1945 114/76 mmHg 97.7 F (36.5 C) Oral 74  20  100 % -  01/10/12 1450 - - - - - 97 % -  01/10/12 1327 105/66 mmHg 97.4 F (36.3 C) Oral 79  19  98 % -   Current Weight  01/11/12 314 lb 3.2 oz (142.52 kg)     Intake/Output from previous day: 12/11 0701 - 12/12 0700 In: 480 [P.O.:480] Out: -   CBGs 93-111-121  PHYSICAL EXAM:  Heart: RRR Lungs: Clear Wound: Clean and dry Extremities: +LE edema    Lab Results: CBC:No results found for this basename: WBC:2,HGB:2,HCT:2,PLT:2 in the last 72 hours BMET:  Basename 01/10/12 0500 01/09/12 0404  NA 136 133*  K 3.8 4.0  CL 96 96  CO2 29 30  GLUCOSE 118* 125*  BUN 24* 19  CREATININE 1.08 0.94  CALCIUM 9.4 9.1    PT/INR: No results found for this basename: LABPROT,INR in the last 72 hours    CXR: Findings: Mild cardiac enlargement with mild pulmonary vascular  congestion. Small left pleural effusion with basilar atelectasis  or infiltration. Slight residual pneumothorax. Tortuous aorta.  Stable appearance of right central venous catheter. No significant  change since previous study.  IMPRESSION:  Cardiac enlargement with mild pulmonary vascular congestion. Left  pleural effusion and basilar atelectasis or infiltration. Minimal  residual left pneumothorax. Stable appearance since previous  study   Assessment/Plan: S/P Procedure(s) (LRB): FLEXIBLE BRONCHOSCOPY  (N/A) THORACOTOMY MAJOR (Left) LOBECTOMY (Left) Pulm- still requires 2L O2.  Will arrange home O2. GU- urinary retention, resolved. Continue Flomax. Home soon.   LOS: 7 days    COLLINS,GINA H 01/11/2012

## 2012-01-12 NOTE — Progress Notes (Addendum)
8 Days Post-Op Procedure(s) (LRB): FLEXIBLE BRONCHOSCOPY (N/A) THORACOTOMY MAJOR (Left) LOBECTOMY (Left) Subjective:  [name] has no complaints this morning.  He denies chest pain and shortness of breath.  He was weaned off his oxygen yesterday. +BM  Objective: Vital signs in last 24 hours: Temp:  [97.5 F (36.4 C)-98.8 F (37.1 C)] 98.8 F (37.1 C) (12/13 0519) Pulse Rate:  [67-80] 71  (12/13 0519) Cardiac Rhythm:  [-] Normal sinus rhythm (12/13 0751) Resp:  [18] 18  (12/13 0519) BP: (108-116)/(57-68) 108/57 mmHg (12/13 0519) SpO2:  [93 %-100 %] 93 % (12/13 0754) Weight:  [312 lb 3.2 oz (141.613 kg)] 312 lb 3.2 oz (141.613 kg) (12/13 0519)  Intake/Output from previous day: 12/12 0701 - 12/13 0700 In: 720 [P.O.:720] Out: 1301 [Urine:1300; Stool:1]  General appearance: alert, cooperative and no distress Heart: regular rate and rhythm Lungs: clear to auscultation bilaterally Abdomen: soft, non-tender; bowel sounds normal; no masses,  no organomegaly Extremities: edema 1-2+ Wound: clean and dyr  Lab Results: No results found for this basename: WBC:2,HGB:2,HCT:2,PLT:2 in the last 72 hours BMET:  Basename 01/10/12 0500  NA 136  K 3.8  CL 96  CO2 29  GLUCOSE 118*  BUN 24*  CREATININE 1.08  CALCIUM 9.4    PT/INR: No results found for this basename: LABPROT,INR in the last 72 hours ABG    Component Value Date/Time   PHART 7.423 01/02/2012 1329   HCO3 24.6* 01/02/2012 1329   TCO2 25.8 01/02/2012 1329   ACIDBASEDEF 1.9 12/19/2011 0900   O2SAT 92.4 01/02/2012 1329   CBG (last 3)   Basename 01/12/12 0618 01/11/12 2046 01/11/12 1628  GLUCAP 118* 101* 99    Assessment/Plan: S/P Procedure(s) (LRB): FLEXIBLE BRONCHOSCOPY (N/A) THORACOTOMY MAJOR (Left) LOBECTOMY (Left)  1. Pulm- weaned off oxygen yesterday, will repeat sats during ambulation, to ensure need for home oxygen 2. Volume overload- + LE edema, will resume home Lasix regimen 3. GU- urinary retention  resolved, on Flomax 4. Dispo- patient doing well, will plan on d/c home today   LOS: 8 days    BARRETT, ERIN 01/12/2012    Chart reviewed, patient examined, agree with above. He looks good and weight is gradually coming down. I think he can go home on previous lasix and Aldactone, potassium doses. His BP and HR are on the low-normal side so I will restart his Coreg and Altace when I see him back in the office. He should have a BMET when I see him back.

## 2012-01-12 NOTE — Progress Notes (Signed)
SATURATION QUALIFICATIONS: (This note is used to comply with regulatory documentation for home oxygen)  Patient Saturations on Room Air at Rest = 96%  Patient Saturations on Room Air while Ambulating = 86%  Patient Saturations on 2 Liters of oxygen while Ambulating = 94%  Please briefly explain why patient needs home oxygen: desat while ambulating due to lung ca; s/p lobectomy

## 2012-01-12 NOTE — Progress Notes (Addendum)
RA sat while sitting in chair 95% O2 sats down to 87% while ambulating in hallway; pt ambulated 200 feet Pt assisted back to room to chair Recovery sats up to 94% with O2 2L while sitting in chair Will cont. To monitor.

## 2012-01-12 NOTE — Progress Notes (Signed)
Pt to d/c home today; pt bathing at this time; once bathing done, will have pt ambulate on RA and check sats and will also d/c central line; pt to d/c home with son; will cont. To monitor.

## 2012-01-12 NOTE — Progress Notes (Signed)
Pt and son given d/c instructions; tele monitor d/c at this time; pt awaiting home O2 prior to d/c home; will cont. To monitor.

## 2012-01-16 ENCOUNTER — Other Ambulatory Visit: Payer: Self-pay | Admitting: *Deleted

## 2012-01-16 DIAGNOSIS — C3490 Malignant neoplasm of unspecified part of unspecified bronchus or lung: Secondary | ICD-10-CM

## 2012-01-17 ENCOUNTER — Telehealth: Payer: Self-pay | Admitting: *Deleted

## 2012-01-17 NOTE — Telephone Encounter (Signed)
FISH Study= Alk gene rearrangement not detected.  SLJ

## 2012-01-18 ENCOUNTER — Telehealth: Payer: Self-pay | Admitting: *Deleted

## 2012-01-18 NOTE — Telephone Encounter (Signed)
EGFR TKI eligibility assay--alteration NOT DETECTED, report given to Dr MKM to review.  SLJ 

## 2012-01-19 ENCOUNTER — Other Ambulatory Visit: Payer: Self-pay | Admitting: *Deleted

## 2012-01-19 ENCOUNTER — Encounter (INDEPENDENT_AMBULATORY_CARE_PROVIDER_SITE_OTHER): Payer: Self-pay

## 2012-01-19 DIAGNOSIS — G8918 Other acute postprocedural pain: Secondary | ICD-10-CM

## 2012-01-19 DIAGNOSIS — I2510 Atherosclerotic heart disease of native coronary artery without angina pectoris: Secondary | ICD-10-CM

## 2012-01-19 MED ORDER — HYDROCODONE-ACETAMINOPHEN 7.5-500 MG PO TABS: 1.0000 | ORAL_TABLET | Freq: Four times a day (QID) | ORAL | Status: DC | PRN

## 2012-01-30 ENCOUNTER — Encounter: Payer: Medicare Other | Admitting: Surgery

## 2012-02-06 ENCOUNTER — Encounter: Payer: Medicare Other | Admitting: Surgery

## 2012-02-07 ENCOUNTER — Encounter: Payer: Medicare Other | Admitting: Surgery

## 2012-02-07 ENCOUNTER — Other Ambulatory Visit: Payer: Self-pay | Admitting: *Deleted

## 2012-02-07 DIAGNOSIS — C3490 Malignant neoplasm of unspecified part of unspecified bronchus or lung: Secondary | ICD-10-CM

## 2012-02-12 ENCOUNTER — Ambulatory Visit
Admission: RE | Admit: 2012-02-12 | Discharge: 2012-02-12 | Disposition: A | Discharge status: Home / Self Care | Payer: Medicare Other | Source: Ambulatory Visit | Attending: Surgery | Admitting: Surgery

## 2012-02-12 ENCOUNTER — Ambulatory Visit (INDEPENDENT_AMBULATORY_CARE_PROVIDER_SITE_OTHER): Payer: Self-pay | Admitting: Physician Assistant

## 2012-02-12 VITALS — BP 159/83 | HR 64 | Resp 20 | Ht 70.0 in | Wt 312.0 lb

## 2012-02-12 DIAGNOSIS — C3490 Malignant neoplasm of unspecified part of unspecified bronchus or lung: Secondary | ICD-10-CM

## 2012-02-12 DIAGNOSIS — Z9889 Other specified postprocedural states: Secondary | ICD-10-CM

## 2012-02-12 DIAGNOSIS — Z09 Encounter for follow-up examination after completed treatment for conditions other than malignant neoplasm: Secondary | ICD-10-CM

## 2012-02-12 DIAGNOSIS — Z902 Acquired absence of lung [part of]: Secondary | ICD-10-CM

## 2012-02-12 NOTE — Patient Instructions (Signed)
Thoracotomy Care After  These instructions give you information on caring for yourself after your procedure. Your doctor may also give you more specific instructions. Call your doctor if you have any problems or questions after your procedure. HOME CARE  Take off the bandage (dressing) over your chest tube area as told by your doctor.  Only take medicine as told by your doctor. Take pain medicine when you need it.  Hold a pillow against your chest when you cough. Cough when you need to.  Do your deep breathing exercises as told by your doctor.  Continue your normal diet and activities as told.  Take showers until your next doctor visit, or as told.  Change bandages if needed or as told.  Avoid lifting or driving until your doctor says it is okay.  See your doctor to get your stitches (sutures) or staples removed as told.  Do not travel by airplane for 2 weeks after the chest tube is taken out. GET HELP RIGHT AWAY IF:  You have a fever.  You have a rash.  You have trouble breathing.  Your medicines are causing you problems.  You feel lightheaded or pass out (faint).  You have shortness of breath or chest pain.  You are bleeding from your wounds.  You feel your heart beating fast or skipping beats (irregular heartbeat).  You have redness, puffiness (swelling), or more pain in the wounds.  You have yellowish-white fluid (pus) coming from your wounds.  You have a bad smell coming from the wound or bandage. MAKE SURE YOU:  Understand these instructions.  Will watch your condition.  Will get help right away if you are not doing well or get worse. Document Released: 07/18/2011 Document Reviewed: 07/01/2010 [facility] Patient Information 2013 [place], [place].
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
## 2012-02-20 ENCOUNTER — Encounter: Payer: Self-pay | Admitting: Pulmonary Disease

## 2012-02-20 ENCOUNTER — Ambulatory Visit (INDEPENDENT_AMBULATORY_CARE_PROVIDER_SITE_OTHER): Payer: Medicare Other | Admitting: Pulmonary Disease

## 2012-02-20 VITALS — BP 122/84 | HR 60 | Temp 98.0°F | Ht 70.5 in | Wt 321.4 lb

## 2012-02-20 DIAGNOSIS — C3490 Malignant neoplasm of unspecified part of unspecified bronchus or lung: Secondary | ICD-10-CM

## 2012-02-20 NOTE — Patient Instructions (Addendum)
You are doing great from a pulmonary standpoint.   Work on weight loss and conditioning.  followup with me as needed.

## 2012-02-20 NOTE — Progress Notes (Signed)
  Subjective:    Patient ID: [name], male    DOB: Jun 10, 1945, 67 y.o.   MRN: [id number]  HPI Patient comes in today for pulmonary followup after his recent left lower lobectomy for lung cancer.  He has done very well postoperatively, and denies any significant cough, congestion, or shortness of breath.  He did not have any airflow obstruction on his preop pulmonary function studies, and therefore does not require maintenance bronchodilators.   Review of Systems  Constitutional: Negative for fever and unexpected weight change.  HENT: Negative for ear pain, nosebleeds, congestion, sore throat, rhinorrhea, sneezing, trouble swallowing, dental problem, postnasal drip and sinus pressure.   Eyes: Negative for redness and itching.  Respiratory: Positive for shortness of breath ( uses 2L O2 prn). Negative for cough, chest tightness and wheezing.   Cardiovascular: Negative for palpitations and leg swelling.  Gastrointestinal: Negative for nausea and vomiting.  Genitourinary: Negative for dysuria.  Musculoskeletal: Negative for joint swelling.  Skin: Negative for rash.  Neurological: Negative for headaches.  Hematological: Does not bruise/bleed easily.  Psychiatric/Behavioral: Negative for dysphoric mood. The patient is not nervous/anxious.        Objective:   Physical Exam Morbidly obese male in no acute distress Nose without purulence or discharge noted Neck without lymphadenopathy or thyromegaly Chest clear to auscultation, no wheezing Cardiac exam with regular rate and rhythm, 2/6 systolic murmur Lower extremities with significant edema, no cyanosis Alert and oriented, moves all 4 extremities.       Assessment & Plan:

## 2012-02-20 NOTE — Assessment & Plan Note (Signed)
The patient is teary stable from a pulmonary standpoint after his recent lung resection surgery.  He has no airflow obstruction by PFTs, but does have restriction because of his significant obesity.  I have asked him to work aggressively on weight loss.  He will followup with me on an as needed basis.

## 2012-02-27 ENCOUNTER — Telehealth: Payer: Self-pay | Admitting: Internal Medicine

## 2012-02-27 ENCOUNTER — Encounter: Payer: Self-pay | Admitting: Surgery

## 2012-02-27 ENCOUNTER — Ambulatory Visit (INDEPENDENT_AMBULATORY_CARE_PROVIDER_SITE_OTHER): Payer: Self-pay | Admitting: Surgery

## 2012-02-27 ENCOUNTER — Ambulatory Visit
Admission: RE | Admit: 2012-02-27 | Discharge: 2012-02-27 | Disposition: A | Discharge status: Home / Self Care | Payer: Medicare Other | Source: Ambulatory Visit | Attending: Surgery | Admitting: Surgery

## 2012-02-27 ENCOUNTER — Telehealth: Payer: Self-pay | Admitting: *Deleted

## 2012-02-27 VITALS — BP 161/75 | HR 59 | Resp 18 | Ht 71.0 in | Wt 321.0 lb

## 2012-02-27 DIAGNOSIS — C3430 Malignant neoplasm of lower lobe, unspecified bronchus or lung: Secondary | ICD-10-CM

## 2012-02-27 DIAGNOSIS — C3490 Malignant neoplasm of unspecified part of unspecified bronchus or lung: Secondary | ICD-10-CM

## 2012-02-27 DIAGNOSIS — Z09 Encounter for follow-up examination after completed treatment for conditions other than malignant neoplasm: Secondary | ICD-10-CM

## 2012-02-27 NOTE — Telephone Encounter (Signed)
Spoke with pt regarding appt 02/28/12 at 1:30 labs and Dr. Arbutus Ped at 2:00.  He verbalized understanding of time and place of appt

## 2012-02-27 NOTE — Telephone Encounter (Signed)
appts made per dana h and pt is aware

## 2012-02-27 NOTE — Progress Notes (Signed)
                   [address]            [name] [address]          [phone]      HPI:  Patient returns for routine postoperative follow-up having undergone left lower lobectomy on 01/04/2012. The patient's early postoperative recovery while in the hospital was notable for an uncomplicated postoperative course. His final pathology showed a T3N0M0 Stage IIA well-differentiated adenocarcinoma that was 11.5 cm in diameter. Since hospital discharge the patient reports that he has been feeling well. He is walking daily without chest wall pain or shortness of breath.   Current Outpatient Prescriptions  Medication Sig Dispense Refill  . aspirin EC 81 MG tablet Take 81 mg by mouth daily.      Marland Kitchen atorvastatin (LIPITOR) 40 MG tablet Take 1 tablet by mouth daily.      . CRESTOR 10 MG tablet Take 10 mg by mouth daily.       . furosemide (LASIX) 80 MG tablet Take 80 mg by mouth 2 (two) times daily.      . potassium chloride SA (K-DUR,KLOR-CON) 20 MEQ tablet Take 20 mEq by mouth 2 (two) times daily.      . ramipril (ALTACE) 5 MG capsule Take 5 mg by mouth daily.       Marland Kitchen spironolactone (ALDACTONE) 25 MG tablet Take 25 mg by mouth daily.         Physical Exam: BP 161/75  Pulse 59  Resp 18  Ht 5\' 11"  (1.803 m)  Wt 321 lb (145.605 kg)  BMI 44.77 kg/m2  SpO2 95% He looks well. Lung exam is clear. The left thoracotomy incision is healing well.  Diagnostic Tests:  *RADIOLOGY REPORT*   Clinical Data: Status post lobectomy, lung surgery, lung cancer   CHEST - 2 VIEW   Comparison: Chest radiograph 02/12/2012   Findings: Stable enlarged heart silhouette.  There is dense left lower lobe atelectasis not changed from prior.   There is chronic bronchitic markings centrally.  No pneumothorax   IMPRESSION: No change in the left lower lobe atelectasis and scarring.     Original Report Authenticated By: Genevive Bi, M.D.     Impression:  Overall I think he is  making good progress following his surgery. 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 I've recommended medical oncology evaluation given the large size of his cancer.  Plan:  We will arrange a consultation with Dr. Arbutus Ped.

## 2012-02-28 ENCOUNTER — Ambulatory Visit (HOSPITAL_BASED_OUTPATIENT_CLINIC_OR_DEPARTMENT_OTHER): Payer: Medicare Other

## 2012-02-28 ENCOUNTER — Encounter: Payer: Self-pay | Admitting: Internal Medicine

## 2012-02-28 ENCOUNTER — Ambulatory Visit: Payer: Medicare Other

## 2012-02-28 ENCOUNTER — Telehealth: Payer: Self-pay | Admitting: *Deleted

## 2012-02-28 ENCOUNTER — Telehealth: Payer: Self-pay | Admitting: Internal Medicine

## 2012-02-28 ENCOUNTER — Other Ambulatory Visit (HOSPITAL_BASED_OUTPATIENT_CLINIC_OR_DEPARTMENT_OTHER): Payer: Medicare Other | Admitting: Lab

## 2012-02-28 ENCOUNTER — Ambulatory Visit (HOSPITAL_BASED_OUTPATIENT_CLINIC_OR_DEPARTMENT_OTHER): Payer: Medicare Other | Admitting: Internal Medicine

## 2012-02-28 VITALS — BP 165/82 | HR 72 | Temp 97.3°F | Resp 18 | Ht 69.5 in | Wt 320.1 lb

## 2012-02-28 DIAGNOSIS — C3430 Malignant neoplasm of lower lobe, unspecified bronchus or lung: Secondary | ICD-10-CM

## 2012-02-28 DIAGNOSIS — C3490 Malignant neoplasm of unspecified part of unspecified bronchus or lung: Secondary | ICD-10-CM

## 2012-02-28 LAB — CBC WITH DIFFERENTIAL/PLATELET
BASO%: 0.5 % (ref 0.0–2.0)
EOS%: 3.1 % (ref 0.0–7.0)
HCT: 37.5 % — ABNORMAL LOW (ref 38.4–49.9)
LYMPH%: 27.4 % (ref 14.0–49.0)
MCH: 32.9 pg (ref 27.2–33.4)
MCHC: 35 g/dL (ref 32.0–36.0)
MCV: 94 fL (ref 79.3–98.0)
MONO#: 0.5 10*3/uL (ref 0.1–0.9)
NEUT%: 57.8 % (ref 39.0–75.0)
Platelets: 241 10*3/uL (ref 140–400)

## 2012-02-28 LAB — COMPREHENSIVE METABOLIC PANEL (CC13)
ALT: 15 U/L (ref 0–55)
CO2: 25 mEq/L (ref 22–29)
Creatinine: 1 mg/dL (ref 0.7–1.3)
Total Bilirubin: 0.39 mg/dL (ref 0.20–1.20)

## 2012-02-28 MED ORDER — DEXAMETHASONE 4 MG PO TABS: ORAL_TABLET | ORAL | Status: DC

## 2012-02-28 MED ORDER — PROCHLORPERAZINE MALEATE 10 MG PO TABS: 10.0000 mg | ORAL_TABLET | Freq: Four times a day (QID) | ORAL | Status: DC | PRN

## 2012-02-28 MED ORDER — FOLIC ACID 1 MG PO TABS: 1.0000 mg | ORAL_TABLET | Freq: Every day | ORAL | Status: DC

## 2012-02-28 MED ORDER — CYANOCOBALAMIN 1000 MCG/ML IJ SOLN
1000.0000 ug | Freq: Once | INTRAMUSCULAR | Status: AC
  Administered 2012-02-28: 1000 ug via INTRAMUSCULAR

## 2012-02-28 NOTE — Telephone Encounter (Signed)
Per staff phone call and POF I have schedueld appts.  JMW  
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
## 2012-02-28 NOTE — Progress Notes (Signed)
Checked in new pt with no financial concerns. °

## 2012-02-28 NOTE — Patient Instructions (Signed)
Your recently diagnosed with a stage IIb non-small cell lung cancer. We discussed adjuvant chemotherapy with cisplatin and Alimta. First cycle expected early next week. Continue to take folic acid, Decadron and Compazine as prescribed. Followup in 2 weeks

## 2012-02-28 NOTE — Telephone Encounter (Signed)
appt made  and printed for  Pt aom

## 2012-02-29 ENCOUNTER — Other Ambulatory Visit: Payer: Medicare Other

## 2012-03-05 ENCOUNTER — Ambulatory Visit (HOSPITAL_BASED_OUTPATIENT_CLINIC_OR_DEPARTMENT_OTHER): Payer: Medicare Other

## 2012-03-05 ENCOUNTER — Other Ambulatory Visit (HOSPITAL_BASED_OUTPATIENT_CLINIC_OR_DEPARTMENT_OTHER): Payer: Medicare Other

## 2012-03-05 VITALS — BP 152/83 | HR 56 | Temp 97.5°F | Resp 20

## 2012-03-05 DIAGNOSIS — Z5111 Encounter for antineoplastic chemotherapy: Secondary | ICD-10-CM

## 2012-03-05 DIAGNOSIS — C3490 Malignant neoplasm of unspecified part of unspecified bronchus or lung: Secondary | ICD-10-CM

## 2012-03-05 DIAGNOSIS — C3430 Malignant neoplasm of lower lobe, unspecified bronchus or lung: Secondary | ICD-10-CM

## 2012-03-05 LAB — COMPREHENSIVE METABOLIC PANEL (CC13)
AST: 17 U/L (ref 5–34)
Albumin: 3.3 g/dL — ABNORMAL LOW (ref 3.5–5.0)
Alkaline Phosphatase: 83 U/L (ref 40–150)
Calcium: 9.1 mg/dL (ref 8.4–10.4)
Chloride: 105 mEq/L (ref 98–107)
Potassium: 4.6 mEq/L (ref 3.5–5.1)
Sodium: 137 mEq/L (ref 136–145)
Total Protein: 8.2 g/dL (ref 6.4–8.3)

## 2012-03-05 LAB — CBC WITH DIFFERENTIAL/PLATELET
BASO%: 0.2 % (ref 0.0–2.0)
LYMPH%: 24.8 % (ref 14.0–49.0)
MCHC: 32.8 g/dL (ref 32.0–36.0)
MCV: 95.3 fL (ref 79.3–98.0)
MONO%: 6.3 % (ref 0.0–14.0)
Platelets: 185 10*3/uL (ref 140–400)
RBC: 4.06 10*6/uL — ABNORMAL LOW (ref 4.20–5.82)
RDW: 13.7 % (ref 11.0–14.6)
WBC: 4.3 10*3/uL (ref 4.0–10.3)
nRBC: 0 % (ref 0–0)

## 2012-03-05 MED ORDER — POTASSIUM CHLORIDE 2 MEQ/ML IV SOLN
Freq: Once | INTRAVENOUS | Status: AC
  Administered 2012-03-05: 09:00:00 via INTRAVENOUS
  Filled 2012-03-05: qty 10

## 2012-03-05 MED ORDER — SODIUM CHLORIDE 0.9 % IV SOLN
Freq: Once | INTRAVENOUS | Status: AC
  Administered 2012-03-05: 09:00:00 via INTRAVENOUS

## 2012-03-05 MED ORDER — DEXAMETHASONE SODIUM PHOSPHATE 4 MG/ML IJ SOLN
12.0000 mg | Freq: Once | INTRAMUSCULAR | Status: AC
  Administered 2012-03-05: 12 mg via INTRAVENOUS

## 2012-03-05 MED ORDER — FOSAPREPITANT DIMEGLUMINE INJECTION 150 MG
150.0000 mg | Freq: Once | INTRAVENOUS | Status: AC
  Administered 2012-03-05: 150 mg via INTRAVENOUS
  Filled 2012-03-05: qty 5

## 2012-03-05 MED ORDER — SODIUM CHLORIDE 0.9 % IV SOLN
500.0000 mg/m2 | Freq: Once | INTRAVENOUS | Status: AC
  Administered 2012-03-05: 1325 mg via INTRAVENOUS
  Filled 2012-03-05: qty 53

## 2012-03-05 MED ORDER — PALONOSETRON HCL INJECTION 0.25 MG/5ML
0.2500 mg | Freq: Once | INTRAVENOUS | Status: AC
  Administered 2012-03-05: 0.25 mg via INTRAVENOUS

## 2012-03-05 MED ORDER — SODIUM CHLORIDE 0.9 % IV SOLN
75.0000 mg/m2 | Freq: Once | INTRAVENOUS | Status: AC
  Administered 2012-03-05: 200 mg via INTRAVENOUS
  Filled 2012-03-05: qty 200

## 2012-03-05 NOTE — Patient Instructions (Addendum)
 Cancer Center Discharge Instructions for Patients Receiving Chemotherapy  Today you received the following chemotherapy agents Alimta/Cisplatin  To help prevent nausea and vomiting after your treatment, we encourage you to take your nausea medication as prescribed.   If you develop nausea and vomiting that is not controlled by your nausea medication, call the clinic. If it is after clinic hours your family physician or the after hours number for the clinic or go to the Emergency Department.   BELOW ARE SYMPTOMS THAT SHOULD BE REPORTED IMMEDIATELY:  *FEVER GREATER THAN 100.5 F  *CHILLS WITH OR WITHOUT FEVER  NAUSEA AND VOMITING THAT IS NOT CONTROLLED WITH YOUR NAUSEA MEDICATION  *UNUSUAL SHORTNESS OF BREATH  *UNUSUAL BRUISING OR BLEEDING  TENDERNESS IN MOUTH AND THROAT WITH OR WITHOUT PRESENCE OF ULCERS  *URINARY PROBLEMS  *BOWEL PROBLEMS  UNUSUAL RASH Items with * indicate a potential emergency and should be followed up as soon as possible.  One of the nurses will contact you 24 hours after your treatment. Please let the nurse know about any problems that you may have experienced. Feel free to call the clinic you have any questions or concerns. The clinic phone number is [phone].   I have been informed and understand all the instructions given to me. I know to contact the clinic, my physician, or go to the Emergency Department if any problems should occur. I do not have any questions at this time, but understand that I may call the clinic during office hours   should I have any questions or need assistance in obtaining follow up care.    __________________________________________  _____________  __________ Signature of Patient or Authorized Representative            Date                   Time    __________________________________________ Nurse's Signature

## 2012-03-06 ENCOUNTER — Telehealth: Payer: Self-pay | Admitting: *Deleted

## 2012-03-06 NOTE — Telephone Encounter (Signed)
Spoke with pt at home today.  Pt stated he had no issues related to post chemo on 03/05/12.   Pt denied nausea/vomiting,  Stated eating and drinking fine,  Stated bowel and bladder function fine.   Pt has antiemetics available if needed.  Reinforced of increased po fluids intake.   Pt voiced understanding and understood to call office with any problems.

## 2012-03-12 ENCOUNTER — Other Ambulatory Visit: Payer: Medicare Other

## 2012-03-13 ENCOUNTER — Other Ambulatory Visit (HOSPITAL_BASED_OUTPATIENT_CLINIC_OR_DEPARTMENT_OTHER): Payer: Medicare Other | Admitting: Lab

## 2012-03-13 ENCOUNTER — Ambulatory Visit (HOSPITAL_BASED_OUTPATIENT_CLINIC_OR_DEPARTMENT_OTHER): Payer: Medicare Other | Admitting: Physician Assistant

## 2012-03-13 ENCOUNTER — Encounter: Payer: Self-pay | Admitting: Physician Assistant

## 2012-03-13 VITALS — BP 132/71 | HR 61 | Temp 98.5°F | Resp 20 | Ht 69.5 in | Wt 319.4 lb

## 2012-03-13 DIAGNOSIS — C3430 Malignant neoplasm of lower lobe, unspecified bronchus or lung: Secondary | ICD-10-CM

## 2012-03-13 DIAGNOSIS — C3490 Malignant neoplasm of unspecified part of unspecified bronchus or lung: Secondary | ICD-10-CM

## 2012-03-13 LAB — COMPREHENSIVE METABOLIC PANEL (CC13)
ALT: 28 U/L (ref 0–55)
AST: 20 U/L (ref 5–34)
Albumin: 3.3 g/dL — ABNORMAL LOW (ref 3.5–5.0)
Alkaline Phosphatase: 66 U/L (ref 40–150)
CO2: 28 mEq/L (ref 22–29)
Chloride: 102 mEq/L (ref 98–107)
Total Protein: 7.6 g/dL (ref 6.4–8.3)

## 2012-03-13 LAB — CBC WITH DIFFERENTIAL/PLATELET
BASO%: 0.1 % (ref 0.0–2.0)
Eosinophils Absolute: 0 10*3/uL (ref 0.0–0.5)
HCT: 36.8 % — ABNORMAL LOW (ref 38.4–49.9)
LYMPH%: 15.4 % (ref 14.0–49.0)
MCHC: 34.2 g/dL (ref 32.0–36.0)
MCV: 93.7 fL (ref 79.3–98.0)
MONO#: 0.3 10*3/uL (ref 0.1–0.9)
MONO%: 5.6 % (ref 0.0–14.0)
NEUT%: 78.9 % — ABNORMAL HIGH (ref 39.0–75.0)
Platelets: 218 10*3/uL (ref 140–400)
RBC: 3.93 10*6/uL — ABNORMAL LOW (ref 4.20–5.82)
WBC: 4.6 10*3/uL (ref 4.0–10.3)

## 2012-03-13 NOTE — Patient Instructions (Addendum)
Continue weekly labs Follow up in 2 weeks prior to your next cycle of chemotherapy

## 2012-03-14 ENCOUNTER — Telehealth: Payer: Self-pay | Admitting: Internal Medicine
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
## 2012-03-14 NOTE — Telephone Encounter (Signed)
S/w pt re appt for 2/25. Pt will get new schedule when he comes in 2/18.

## 2012-03-19 ENCOUNTER — Other Ambulatory Visit (HOSPITAL_BASED_OUTPATIENT_CLINIC_OR_DEPARTMENT_OTHER): Payer: Medicare Other | Admitting: Lab

## 2012-03-19 DIAGNOSIS — C3490 Malignant neoplasm of unspecified part of unspecified bronchus or lung: Secondary | ICD-10-CM

## 2012-03-19 LAB — CBC WITH DIFFERENTIAL/PLATELET
BASO%: 0.1 % (ref 0.0–2.0)
EOS%: 0.1 % (ref 0.0–7.0)
LYMPH%: 14.3 % (ref 14.0–49.0)
MCH: 31.6 pg (ref 27.2–33.4)
MCHC: 33.6 g/dL (ref 32.0–36.0)
MCV: 94.3 fL (ref 79.3–98.0)
MONO%: 10.6 % (ref 0.0–14.0)
NEUT#: 3.3 10*3/uL (ref 1.5–6.5)
RBC: 3.91 10*6/uL — ABNORMAL LOW (ref 4.20–5.82)
RDW: 14.7 % — ABNORMAL HIGH (ref 11.0–14.6)

## 2012-03-26 ENCOUNTER — Encounter: Payer: Self-pay | Admitting: Physician Assistant

## 2012-03-26 ENCOUNTER — Other Ambulatory Visit: Payer: Medicare Other | Admitting: Lab

## 2012-03-26 ENCOUNTER — Other Ambulatory Visit (HOSPITAL_BASED_OUTPATIENT_CLINIC_OR_DEPARTMENT_OTHER): Payer: Medicare Other | Admitting: Lab

## 2012-03-26 ENCOUNTER — Ambulatory Visit (HOSPITAL_BASED_OUTPATIENT_CLINIC_OR_DEPARTMENT_OTHER): Payer: Medicare Other | Admitting: Physician Assistant

## 2012-03-26 ENCOUNTER — Ambulatory Visit (HOSPITAL_BASED_OUTPATIENT_CLINIC_OR_DEPARTMENT_OTHER): Payer: Medicare Other

## 2012-03-26 ENCOUNTER — Ambulatory Visit: Payer: Medicare Other

## 2012-03-26 DIAGNOSIS — C3430 Malignant neoplasm of lower lobe, unspecified bronchus or lung: Secondary | ICD-10-CM

## 2012-03-26 LAB — COMPREHENSIVE METABOLIC PANEL (CC13)
Alkaline Phosphatase: 53 U/L (ref 40–150)
Creatinine: 1.3 mg/dL (ref 0.7–1.3)
Glucose: 131 mg/dl — ABNORMAL HIGH (ref 70–99)
Sodium: 136 mEq/L (ref 136–145)
Total Bilirubin: 0.37 mg/dL (ref 0.20–1.20)
Total Protein: 6.9 g/dL (ref 6.4–8.3)

## 2012-03-26 LAB — CBC WITH DIFFERENTIAL/PLATELET
Basophils Absolute: 0 10*3/uL (ref 0.0–0.1)
EOS%: 0 % (ref 0.0–7.0)
HGB: 13.6 g/dL (ref 13.0–17.1)
LYMPH%: 29 % (ref 14.0–49.0)
MCH: 31.7 pg (ref 27.2–33.4)
MCV: 95.3 fL (ref 79.3–98.0)
MONO%: 16.4 % — ABNORMAL HIGH (ref 0.0–14.0)
RBC: 4.29 10*6/uL (ref 4.20–5.82)
RDW: 14.8 % — ABNORMAL HIGH (ref 11.0–14.6)

## 2012-03-26 MED ORDER — SODIUM CHLORIDE 0.9 % IV SOLN
75.0000 mg/m2 | Freq: Once | INTRAVENOUS | Status: AC
  Administered 2012-03-26: 200 mg via INTRAVENOUS
  Filled 2012-03-26: qty 200

## 2012-03-26 MED ORDER — SODIUM CHLORIDE 0.9 % IV SOLN
500.0000 mg/m2 | Freq: Once | INTRAVENOUS | Status: AC
  Administered 2012-03-26: 1325 mg via INTRAVENOUS
  Filled 2012-03-26: qty 53

## 2012-03-26 MED ORDER — SODIUM CHLORIDE 0.9 % IV SOLN
150.0000 mg | Freq: Once | INTRAVENOUS | Status: AC
  Administered 2012-03-26: 150 mg via INTRAVENOUS
  Filled 2012-03-26: qty 5

## 2012-03-26 MED ORDER — PALONOSETRON HCL INJECTION 0.25 MG/5ML
0.2500 mg | Freq: Once | INTRAVENOUS | Status: AC
  Administered 2012-03-26: 0.25 mg via INTRAVENOUS

## 2012-03-26 MED ORDER — DEXAMETHASONE SODIUM PHOSPHATE 4 MG/ML IJ SOLN
12.0000 mg | Freq: Once | INTRAMUSCULAR | Status: AC
  Administered 2012-03-26: 13:00:00 via INTRAVENOUS

## 2012-03-26 MED ORDER — SODIUM CHLORIDE 0.9 % IV SOLN
Freq: Once | INTRAVENOUS | Status: AC
  Administered 2012-03-26: 10:00:00 via INTRAVENOUS

## 2012-03-26 MED ORDER — MANNITOL 25 % IV SOLN
Freq: Once | INTRAVENOUS | Status: AC
  Administered 2012-03-26: 10:00:00 via INTRAVENOUS
  Filled 2012-03-26: qty 10

## 2012-03-26 NOTE — Patient Instructions (Addendum)
[facility] Discharge Instructions for Patients Receiving Chemotherapy  Today you received the following chemotherapy agents Cisplatin and Alimta.  To help prevent nausea and vomiting after your treatment, we encourage you to take your nausea medication as directed.  If you develop nausea and vomiting that is not controlled by your nausea medication, call the clinic. If it is after clinic hours your family physician or the after hours number for the clinic or go to the Emergency Department.   BELOW ARE SYMPTOMS THAT SHOULD BE REPORTED IMMEDIATELY:  *FEVER GREATER THAN 100.5 F  *CHILLS WITH OR WITHOUT FEVER  NAUSEA AND VOMITING THAT IS NOT CONTROLLED WITH YOUR NAUSEA MEDICATION  *UNUSUAL SHORTNESS OF BREATH  *UNUSUAL BRUISING OR BLEEDING  TENDERNESS IN MOUTH AND THROAT WITH OR WITHOUT PRESENCE OF ULCERS  *URINARY PROBLEMS  *BOWEL PROBLEMS  UNUSUAL RASH Items with * indicate a potential emergency and should be followed up as soon as possible.  Feel free to call the clinic you have any questions or concerns. The clinic phone number is [phone].

## 2012-03-26 NOTE — Patient Instructions (Addendum)
Continue weekly labs as scheduled Followup in 3 weeks prior to her next scheduled cycle of adjuvant chemotherapy
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
## 2012-03-28 ENCOUNTER — Telehealth: Payer: Self-pay | Admitting: Internal Medicine

## 2012-04-02 ENCOUNTER — Other Ambulatory Visit (HOSPITAL_BASED_OUTPATIENT_CLINIC_OR_DEPARTMENT_OTHER): Payer: Medicare Other

## 2012-04-02 LAB — CBC WITH DIFFERENTIAL/PLATELET
Basophils Absolute: 0 10*3/uL (ref 0.0–0.1)
Eosinophils Absolute: 0 10*3/uL (ref 0.0–0.5)
HGB: 13.1 g/dL (ref 13.0–17.1)
MCV: 94.5 fL (ref 79.3–98.0)
NEUT#: 2.6 10*3/uL (ref 1.5–6.5)
RDW: 14.2 % (ref 11.0–14.6)
lymph#: 0.7 10*3/uL — ABNORMAL LOW (ref 0.9–3.3)

## 2012-04-02 LAB — COMPREHENSIVE METABOLIC PANEL (CC13)
Albumin: 2.9 g/dL — ABNORMAL LOW (ref 3.5–5.0)
BUN: 20.4 mg/dL (ref 7.0–26.0)
Calcium: 9.2 mg/dL (ref 8.4–10.4)
Chloride: 101 mEq/L (ref 98–107)
Glucose: 123 mg/dl — ABNORMAL HIGH (ref 70–99)
Potassium: 4.6 mEq/L (ref 3.5–5.1)

## 2012-04-09 ENCOUNTER — Other Ambulatory Visit: Payer: Self-pay | Admitting: Internal Medicine

## 2012-04-09 ENCOUNTER — Other Ambulatory Visit (HOSPITAL_BASED_OUTPATIENT_CLINIC_OR_DEPARTMENT_OTHER): Payer: Medicare Other

## 2012-04-09 LAB — CBC WITH DIFFERENTIAL/PLATELET
Eosinophils Absolute: 0 10*3/uL (ref 0.0–0.5)
MONO#: 0.3 10*3/uL (ref 0.1–0.9)
MONO%: 7.2 % (ref 0.0–14.0)
NEUT#: 2.7 10*3/uL (ref 1.5–6.5)
RBC: 3.94 10*6/uL — ABNORMAL LOW (ref 4.20–5.82)
RDW: 15.1 % — ABNORMAL HIGH (ref 11.0–14.6)
WBC: 3.5 10*3/uL — ABNORMAL LOW (ref 4.0–10.3)

## 2012-04-09 LAB — COMPREHENSIVE METABOLIC PANEL (CC13)
ALT: 24 U/L (ref 0–55)
AST: 22 U/L (ref 5–34)
Alkaline Phosphatase: 66 U/L (ref 40–150)
Creatinine: 1 mg/dL (ref 0.7–1.3)
Sodium: 137 mEq/L (ref 136–145)
Total Bilirubin: 0.61 mg/dL (ref 0.20–1.20)
Total Protein: 7.3 g/dL (ref 6.4–8.3)

## 2012-04-09 LAB — MAGNESIUM (CC13): Magnesium: 2.1 mg/dl (ref 1.5–2.5)

## 2012-04-16 ENCOUNTER — Ambulatory Visit (HOSPITAL_BASED_OUTPATIENT_CLINIC_OR_DEPARTMENT_OTHER): Payer: Medicare Other

## 2012-04-16 ENCOUNTER — Encounter: Payer: Self-pay | Admitting: Physician Assistant

## 2012-04-16 ENCOUNTER — Ambulatory Visit (HOSPITAL_BASED_OUTPATIENT_CLINIC_OR_DEPARTMENT_OTHER): Payer: Medicare Other | Admitting: Physician Assistant

## 2012-04-16 ENCOUNTER — Telehealth: Payer: Self-pay | Admitting: Internal Medicine

## 2012-04-16 ENCOUNTER — Other Ambulatory Visit (HOSPITAL_BASED_OUTPATIENT_CLINIC_OR_DEPARTMENT_OTHER): Payer: Medicare Other | Admitting: Lab

## 2012-04-16 VITALS — BP 128/74 | HR 75 | Temp 98.1°F | Resp 22

## 2012-04-16 DIAGNOSIS — C3430 Malignant neoplasm of lower lobe, unspecified bronchus or lung: Secondary | ICD-10-CM

## 2012-04-16 LAB — COMPREHENSIVE METABOLIC PANEL (CC13)
Albumin: 3.1 g/dL — ABNORMAL LOW (ref 3.5–5.0)
Alkaline Phosphatase: 65 U/L (ref 40–150)
BUN: 23.6 mg/dL (ref 7.0–26.0)
Calcium: 9.2 mg/dL (ref 8.4–10.4)
Chloride: 104 mEq/L (ref 98–107)
Creatinine: 1.2 mg/dL (ref 0.7–1.3)
Glucose: 220 mg/dl — ABNORMAL HIGH (ref 70–99)
Potassium: 3.8 mEq/L (ref 3.5–5.1)

## 2012-04-16 LAB — CBC WITH DIFFERENTIAL/PLATELET
BASO%: 0.2 % (ref 0.0–2.0)
HCT: 38.4 % (ref 38.4–49.9)
LYMPH%: 27.1 % (ref 14.0–49.0)
MCH: 31.3 pg (ref 27.2–33.4)
MCHC: 33.3 g/dL (ref 32.0–36.0)
MCV: 93.9 fL (ref 79.3–98.0)
MONO#: 0.6 10*3/uL (ref 0.1–0.9)
MONO%: 11.7 % (ref 0.0–14.0)
NEUT%: 60.8 % (ref 39.0–75.0)
Platelets: 417 10*3/uL — ABNORMAL HIGH (ref 140–400)
WBC: 4.7 10*3/uL (ref 4.0–10.3)

## 2012-04-16 MED ORDER — SODIUM CHLORIDE 0.9 % IV SOLN
Freq: Once | INTRAVENOUS | Status: AC
  Administered 2012-04-16: 10:00:00 via INTRAVENOUS

## 2012-04-16 MED ORDER — CYANOCOBALAMIN 1000 MCG/ML IJ SOLN
1000.0000 ug | Freq: Once | INTRAMUSCULAR | Status: AC
  Administered 2012-04-16: 1000 ug via INTRAMUSCULAR

## 2012-04-16 MED ORDER — DEXAMETHASONE SODIUM PHOSPHATE 4 MG/ML IJ SOLN
12.0000 mg | Freq: Once | INTRAMUSCULAR | Status: AC
  Administered 2012-04-16: 12 mg via INTRAVENOUS

## 2012-04-16 MED ORDER — CISPLATIN CHEMO INJECTION 100MG/100ML
75.0000 mg/m2 | Freq: Once | INTRAVENOUS | Status: AC
  Administered 2012-04-16: 200 mg via INTRAVENOUS
  Filled 2012-04-16: qty 200

## 2012-04-16 MED ORDER — POTASSIUM CHLORIDE 2 MEQ/ML IV SOLN
Freq: Once | INTRAVENOUS | Status: AC
  Administered 2012-04-16: 11:00:00 via INTRAVENOUS
  Filled 2012-04-16: qty 10

## 2012-04-16 MED ORDER — SODIUM CHLORIDE 0.9 % IV SOLN
500.0000 mg/m2 | Freq: Once | INTRAVENOUS | Status: AC
  Administered 2012-04-16: 1325 mg via INTRAVENOUS
  Filled 2012-04-16: qty 53

## 2012-04-16 MED ORDER — PALONOSETRON HCL INJECTION 0.25 MG/5ML
0.2500 mg | Freq: Once | INTRAVENOUS | Status: AC
  Administered 2012-04-16: 0.25 mg via INTRAVENOUS

## 2012-04-16 MED ORDER — SODIUM CHLORIDE 0.9 % IV SOLN
150.0000 mg | Freq: Once | INTRAVENOUS | Status: AC
  Administered 2012-04-16: 150 mg via INTRAVENOUS
  Filled 2012-04-16: qty 5

## 2012-04-16 NOTE — Progress Notes (Signed)
Pt received 600 mL of hydration fluid.

## 2012-04-16 NOTE — Telephone Encounter (Signed)
Gave pt appt for lab, MD and chemo appt for March and April 2014

## 2012-04-16 NOTE — Patient Instructions (Addendum)
[facility] Discharge Instructions for Patients Receiving Chemotherapy  Today you received the following chemotherapy agents: alimta, cisplatin  To help prevent nausea and vomiting after your treatment, we encourage you to take your nausea medication.  Take it as often as prescribed.     If you develop nausea and vomiting that is not controlled by your nausea medication, call the clinic. If it is after clinic hours your family physician or the after hours number for the clinic or go to the Emergency Department.   BELOW ARE SYMPTOMS THAT SHOULD BE REPORTED IMMEDIATELY:  *FEVER GREATER THAN 100.5 F  *CHILLS WITH OR WITHOUT FEVER  NAUSEA AND VOMITING THAT IS NOT CONTROLLED WITH YOUR NAUSEA MEDICATION  *UNUSUAL SHORTNESS OF BREATH  *UNUSUAL BRUISING OR BLEEDING  TENDERNESS IN MOUTH AND THROAT WITH OR WITHOUT PRESENCE OF ULCERS  *URINARY PROBLEMS  *BOWEL PROBLEMS  UNUSUAL RASH Items with * indicate a potential emergency and should be followed up as soon as possible.  Feel free to call the clinic you have any questions or concerns. The clinic phone number is [phone].   I have been informed and understand all the instructions given to me. I know to contact the clinic, my physician, or go to the Emergency Department if any problems should occur. I do not have any questions at this time, but understand that I may call the clinic during office hours   should I have any questions or need assistance in obtaining follow up care.    __________________________________________  _____________  __________ Signature of Patient or Authorized Representative            Date                   Time    __________________________________________ Nurse's Signature    

## 2012-04-16 NOTE — Patient Instructions (Addendum)
Continue weekly labs as scheduled Followup in 3 weeks prior to your next scheduled cycle of adjuvant chemotherapy
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
## 2012-04-23 ENCOUNTER — Other Ambulatory Visit (HOSPITAL_BASED_OUTPATIENT_CLINIC_OR_DEPARTMENT_OTHER): Payer: Medicare Other

## 2012-04-23 DIAGNOSIS — C3430 Malignant neoplasm of lower lobe, unspecified bronchus or lung: Secondary | ICD-10-CM

## 2012-04-23 DIAGNOSIS — C3490 Malignant neoplasm of unspecified part of unspecified bronchus or lung: Secondary | ICD-10-CM

## 2012-04-23 LAB — CBC WITH DIFFERENTIAL/PLATELET
BASO%: 0.3 % (ref 0.0–2.0)
Basophils Absolute: 0 10e3/uL (ref 0.0–0.1)
EOS%: 0.1 % (ref 0.0–7.0)
Eosinophils Absolute: 0 10e3/uL (ref 0.0–0.5)
HCT: 36.6 % — ABNORMAL LOW (ref 38.4–49.9)
HGB: 12.6 g/dL — ABNORMAL LOW (ref 13.0–17.1)
LYMPH%: 20.9 % (ref 14.0–49.0)
MCH: 32 pg (ref 27.2–33.4)
MCHC: 34.5 g/dL (ref 32.0–36.0)
MCV: 92.8 fL (ref 79.3–98.0)
MONO#: 0.3 10e3/uL (ref 0.1–0.9)
MONO%: 6.9 % (ref 0.0–14.0)
NEUT#: 3 10e3/uL (ref 1.5–6.5)
NEUT%: 71.8 % (ref 39.0–75.0)
Platelets: 272 10e3/uL (ref 140–400)
RBC: 3.95 10e6/uL — ABNORMAL LOW (ref 4.20–5.82)
RDW: 15.4 % — ABNORMAL HIGH (ref 11.0–14.6)
WBC: 4.1 10e3/uL (ref 4.0–10.3)
lymph#: 0.9 10e3/uL (ref 0.9–3.3)

## 2012-04-23 LAB — COMPREHENSIVE METABOLIC PANEL (CC13)
Albumin: 3 g/dL — ABNORMAL LOW (ref 3.5–5.0)
Alkaline Phosphatase: 59 U/L (ref 40–150)
BUN: 18.7 mg/dL (ref 7.0–26.0)
CO2: 26 mEq/L (ref 22–29)
Glucose: 132 mg/dl — ABNORMAL HIGH (ref 70–99)
Sodium: 137 mEq/L (ref 136–145)
Total Bilirubin: 0.35 mg/dL (ref 0.20–1.20)
Total Protein: 6.7 g/dL (ref 6.4–8.3)

## 2012-04-23 LAB — MAGNESIUM (CC13): Magnesium: 1.7 mg/dl (ref 1.5–2.5)

## 2012-04-30 ENCOUNTER — Other Ambulatory Visit (HOSPITAL_BASED_OUTPATIENT_CLINIC_OR_DEPARTMENT_OTHER): Payer: Medicare Other

## 2012-04-30 DIAGNOSIS — C3430 Malignant neoplasm of lower lobe, unspecified bronchus or lung: Secondary | ICD-10-CM

## 2012-04-30 DIAGNOSIS — C3490 Malignant neoplasm of unspecified part of unspecified bronchus or lung: Secondary | ICD-10-CM

## 2012-04-30 LAB — CBC WITH DIFFERENTIAL/PLATELET
BASO%: 0.1 % (ref 0.0–2.0)
Eosinophils Absolute: 0 10*3/uL (ref 0.0–0.5)
HCT: 35 % — ABNORMAL LOW (ref 38.4–49.9)
MCHC: 33.8 g/dL (ref 32.0–36.0)
MONO#: 0.4 10*3/uL (ref 0.1–0.9)
NEUT#: 5.3 10*3/uL (ref 1.5–6.5)
Platelets: 145 10*3/uL (ref 140–400)
RBC: 3.75 10*6/uL — ABNORMAL LOW (ref 4.20–5.82)
WBC: 6.2 10*3/uL (ref 4.0–10.3)
lymph#: 0.5 10*3/uL — ABNORMAL LOW (ref 0.9–3.3)

## 2012-04-30 LAB — COMPREHENSIVE METABOLIC PANEL (CC13)
ALT: 24 U/L (ref 0–55)
Albumin: 2.8 g/dL — ABNORMAL LOW (ref 3.5–5.0)
CO2: 26 mEq/L (ref 22–29)
Calcium: 8.8 mg/dL (ref 8.4–10.4)
Chloride: 103 mEq/L (ref 98–107)
Glucose: 227 mg/dl — ABNORMAL HIGH (ref 70–99)
Sodium: 138 mEq/L (ref 136–145)
Total Protein: 6.3 g/dL — ABNORMAL LOW (ref 6.4–8.3)

## 2012-04-30 LAB — MAGNESIUM (CC13): Magnesium: 1.7 mg/dl (ref 1.5–2.5)

## 2012-05-07 ENCOUNTER — Other Ambulatory Visit (HOSPITAL_BASED_OUTPATIENT_CLINIC_OR_DEPARTMENT_OTHER): Payer: Medicare Other | Admitting: Lab

## 2012-05-07 ENCOUNTER — Ambulatory Visit (HOSPITAL_BASED_OUTPATIENT_CLINIC_OR_DEPARTMENT_OTHER): Payer: Medicare Other

## 2012-05-07 ENCOUNTER — Telehealth: Payer: Self-pay | Admitting: Internal Medicine

## 2012-05-07 ENCOUNTER — Encounter: Payer: Self-pay | Admitting: Physician Assistant

## 2012-05-07 ENCOUNTER — Ambulatory Visit (HOSPITAL_BASED_OUTPATIENT_CLINIC_OR_DEPARTMENT_OTHER): Payer: Medicare Other | Admitting: Physician Assistant

## 2012-05-07 VITALS — BP 153/74 | HR 71 | Temp 99.0°F

## 2012-05-07 DIAGNOSIS — C3430 Malignant neoplasm of lower lobe, unspecified bronchus or lung: Secondary | ICD-10-CM

## 2012-05-07 DIAGNOSIS — C3490 Malignant neoplasm of unspecified part of unspecified bronchus or lung: Secondary | ICD-10-CM

## 2012-05-07 DIAGNOSIS — Z5111 Encounter for antineoplastic chemotherapy: Secondary | ICD-10-CM

## 2012-05-07 DIAGNOSIS — R0602 Shortness of breath: Secondary | ICD-10-CM

## 2012-05-07 LAB — CBC WITH DIFFERENTIAL/PLATELET
BASO%: 0.2 % (ref 0.0–2.0)
Basophils Absolute: 0 10*3/uL (ref 0.0–0.1)
EOS%: 0 % (ref 0.0–7.0)
HGB: 12.7 g/dL — ABNORMAL LOW (ref 13.0–17.1)
MCH: 30.8 pg (ref 27.2–33.4)
MCHC: 33.1 g/dL (ref 32.0–36.0)
MCV: 93.2 fL (ref 79.3–98.0)
MONO%: 16.2 % — ABNORMAL HIGH (ref 0.0–14.0)
NEUT%: 69.7 % (ref 39.0–75.0)
RDW: 16.4 % — ABNORMAL HIGH (ref 11.0–14.6)
lymph#: 0.7 10*3/uL — ABNORMAL LOW (ref 0.9–3.3)

## 2012-05-07 LAB — COMPREHENSIVE METABOLIC PANEL (CC13)
AST: 17 U/L (ref 5–34)
Alkaline Phosphatase: 64 U/L (ref 40–150)
BUN: 18.2 mg/dL (ref 7.0–26.0)
Glucose: 130 mg/dl — ABNORMAL HIGH (ref 70–99)
Sodium: 138 mEq/L (ref 136–145)
Total Bilirubin: 0.47 mg/dL (ref 0.20–1.20)

## 2012-05-07 MED ORDER — SODIUM CHLORIDE 0.9 % IV SOLN
Freq: Once | INTRAVENOUS | Status: AC
  Administered 2012-05-07: 11:00:00 via INTRAVENOUS

## 2012-05-07 MED ORDER — PALONOSETRON HCL INJECTION 0.25 MG/5ML
0.2500 mg | Freq: Once | INTRAVENOUS | Status: AC
  Administered 2012-05-07: 0.25 mg via INTRAVENOUS

## 2012-05-07 MED ORDER — POTASSIUM CHLORIDE 2 MEQ/ML IV SOLN
Freq: Once | INTRAVENOUS | Status: AC
  Administered 2012-05-07: 11:00:00 via INTRAVENOUS
  Filled 2012-05-07: qty 10

## 2012-05-07 MED ORDER — SODIUM CHLORIDE 0.9 % IV SOLN
75.0000 mg/m2 | Freq: Once | INTRAVENOUS | Status: AC
  Administered 2012-05-07: 200 mg via INTRAVENOUS
  Filled 2012-05-07: qty 200

## 2012-05-07 MED ORDER — SODIUM CHLORIDE 0.9 % IV SOLN
500.0000 mg/m2 | Freq: Once | INTRAVENOUS | Status: AC
  Administered 2012-05-07: 1325 mg via INTRAVENOUS
  Filled 2012-05-07: qty 53

## 2012-05-07 MED ORDER — FOSAPREPITANT DIMEGLUMINE INJECTION 150 MG
150.0000 mg | Freq: Once | INTRAVENOUS | Status: AC
  Administered 2012-05-07: 150 mg via INTRAVENOUS
  Filled 2012-05-07: qty 5

## 2012-05-07 MED ORDER — DEXAMETHASONE SODIUM PHOSPHATE 4 MG/ML IJ SOLN
12.0000 mg | Freq: Once | INTRAMUSCULAR | Status: AC
  Administered 2012-05-07: 13:00:00 via INTRAVENOUS

## 2012-05-07 NOTE — Telephone Encounter (Signed)
, °

## 2012-05-07 NOTE — Patient Instructions (Addendum)
[facility] Discharge Instructions for Patients Receiving Chemotherapy  Today you received the following chemotherapy agents Almita, Cisplatin To help prevent nausea and vomiting after your treatment, we encourage you to take your nausea medication as prescribed. Begin taking it as directed and take it as often as prescribed for the next 48-72hours.   If you develop nausea and vomiting that is not controlled by your nausea medication, call the clinic. If it is after clinic hours your family physician or the after hours number for the clinic or go to the Emergency Department.   BELOW ARE SYMPTOMS THAT SHOULD BE REPORTED IMMEDIATELY:  *FEVER GREATER THAN 100.5 F  *CHILLS WITH OR WITHOUT FEVER  NAUSEA AND VOMITING THAT IS NOT CONTROLLED WITH YOUR NAUSEA MEDICATION  *UNUSUAL SHORTNESS OF BREATH  *UNUSUAL BRUISING OR BLEEDING  TENDERNESS IN MOUTH AND THROAT WITH OR WITHOUT PRESENCE OF ULCERS  *URINARY PROBLEMS  *BOWEL PROBLEMS  UNUSUAL RASH Items with * indicate a potential emergency and should be followed up as soon as possible.  One of the nurses will contact you 24 hours after your treatment. Please let the nurse know about any problems that you may have experienced. Feel free to call the clinic you have any questions or concerns. The clinic phone number is [phone].

## 2012-05-08 ENCOUNTER — Other Ambulatory Visit: Payer: Self-pay | Admitting: Internal Medicine
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
## 2012-05-14 ENCOUNTER — Other Ambulatory Visit (HOSPITAL_BASED_OUTPATIENT_CLINIC_OR_DEPARTMENT_OTHER): Payer: Medicare Other

## 2012-05-14 DIAGNOSIS — C3490 Malignant neoplasm of unspecified part of unspecified bronchus or lung: Secondary | ICD-10-CM

## 2012-05-14 DIAGNOSIS — C3430 Malignant neoplasm of lower lobe, unspecified bronchus or lung: Secondary | ICD-10-CM

## 2012-05-14 LAB — COMPREHENSIVE METABOLIC PANEL (CC13)
AST: 18 U/L (ref 5–34)
Albumin: 3.1 g/dL — ABNORMAL LOW (ref 3.5–5.0)
Alkaline Phosphatase: 61 U/L (ref 40–150)
BUN: 23.9 mg/dL (ref 7.0–26.0)
Creatinine: 1.1 mg/dL (ref 0.7–1.3)
Glucose: 179 mg/dl — ABNORMAL HIGH (ref 70–99)
Potassium: 4.2 mEq/L (ref 3.5–5.1)
Total Bilirubin: 0.44 mg/dL (ref 0.20–1.20)

## 2012-05-14 LAB — CBC WITH DIFFERENTIAL/PLATELET
BASO%: 0.1 % (ref 0.0–2.0)
Basophils Absolute: 0 10*3/uL (ref 0.0–0.1)
EOS%: 0.1 % (ref 0.0–7.0)
HCT: 36.7 % — ABNORMAL LOW (ref 38.4–49.9)
MCH: 31.5 pg (ref 27.2–33.4)
MCHC: 34 g/dL (ref 32.0–36.0)
MCV: 92.6 fL (ref 79.3–98.0)
MONO%: 3.4 % (ref 0.0–14.0)
NEUT%: 84.8 % — ABNORMAL HIGH (ref 39.0–75.0)
RDW: 17 % — ABNORMAL HIGH (ref 11.0–14.6)
lymph#: 0.5 10*3/uL — ABNORMAL LOW (ref 0.9–3.3)

## 2012-05-21 ENCOUNTER — Other Ambulatory Visit (HOSPITAL_BASED_OUTPATIENT_CLINIC_OR_DEPARTMENT_OTHER): Payer: Medicare Other

## 2012-05-21 ENCOUNTER — Other Ambulatory Visit: Payer: Self-pay | Admitting: *Deleted

## 2012-05-21 DIAGNOSIS — C3430 Malignant neoplasm of lower lobe, unspecified bronchus or lung: Secondary | ICD-10-CM

## 2012-05-21 LAB — COMPREHENSIVE METABOLIC PANEL (CC13)
AST: 15 U/L (ref 5–34)
Albumin: 2.9 g/dL — ABNORMAL LOW (ref 3.5–5.0)
Alkaline Phosphatase: 67 U/L (ref 40–150)
BUN: 19.9 mg/dL (ref 7.0–26.0)
Creatinine: 1.2 mg/dL (ref 0.7–1.3)
Glucose: 220 mg/dl — ABNORMAL HIGH (ref 70–99)
Potassium: 4.2 mEq/L (ref 3.5–5.1)

## 2012-05-21 LAB — CBC WITH DIFFERENTIAL/PLATELET
Basophils Absolute: 0 10*3/uL (ref 0.0–0.1)
EOS%: 0 % (ref 0.0–7.0)
Eosinophils Absolute: 0 10*3/uL (ref 0.0–0.5)
HCT: 35.3 % — ABNORMAL LOW (ref 38.4–49.9)
HGB: 11.8 g/dL — ABNORMAL LOW (ref 13.0–17.1)
LYMPH%: 10.1 % — ABNORMAL LOW (ref 14.0–49.0)
MCH: 31.3 pg (ref 27.2–33.4)
MCV: 93.3 fL (ref 79.3–98.0)
MONO%: 4.5 % (ref 0.0–14.0)
NEUT#: 4.1 10*3/uL (ref 1.5–6.5)
NEUT%: 85.3 % — ABNORMAL HIGH (ref 39.0–75.0)
Platelets: 195 10*3/uL (ref 140–400)
RDW: 17.5 % — ABNORMAL HIGH (ref 11.0–14.6)

## 2012-05-24 ENCOUNTER — Ambulatory Visit (HOSPITAL_COMMUNITY)
Admission: RE | Admit: 2012-05-24 | Discharge: 2012-05-24 | Disposition: A | Discharge status: Home / Self Care | Payer: Medicare Other | Source: Ambulatory Visit | Attending: Physician Assistant | Admitting: Physician Assistant

## 2012-05-24 DIAGNOSIS — J438 Other emphysema: Secondary | ICD-10-CM | POA: Insufficient documentation

## 2012-05-24 DIAGNOSIS — C3490 Malignant neoplasm of unspecified part of unspecified bronchus or lung: Secondary | ICD-10-CM | POA: Insufficient documentation

## 2012-05-24 DIAGNOSIS — Z902 Acquired absence of lung [part of]: Secondary | ICD-10-CM | POA: Insufficient documentation

## 2012-05-24 MED ORDER — IOHEXOL 300 MG/ML  SOLN
100.0000 mL | Freq: Once | INTRAMUSCULAR | Status: AC | PRN
  Administered 2012-05-24: 100 mL via INTRAVENOUS

## 2012-05-28 ENCOUNTER — Other Ambulatory Visit: Payer: Medicare Other

## 2012-05-28 ENCOUNTER — Telehealth: Payer: Self-pay | Admitting: Internal Medicine

## 2012-05-28 ENCOUNTER — Other Ambulatory Visit (HOSPITAL_BASED_OUTPATIENT_CLINIC_OR_DEPARTMENT_OTHER): Payer: Medicare Other | Admitting: Lab

## 2012-05-28 ENCOUNTER — Ambulatory Visit (HOSPITAL_BASED_OUTPATIENT_CLINIC_OR_DEPARTMENT_OTHER): Payer: Medicare Other | Admitting: Internal Medicine

## 2012-05-28 ENCOUNTER — Other Ambulatory Visit: Payer: Self-pay | Admitting: Medical Oncology

## 2012-05-28 ENCOUNTER — Encounter: Payer: Self-pay | Admitting: Internal Medicine

## 2012-05-28 DIAGNOSIS — C3490 Malignant neoplasm of unspecified part of unspecified bronchus or lung: Secondary | ICD-10-CM

## 2012-05-28 LAB — CBC WITH DIFFERENTIAL/PLATELET
BASO%: 0.1 % (ref 0.0–2.0)
EOS%: 0 % (ref 0.0–7.0)
HGB: 12.4 g/dL — ABNORMAL LOW (ref 13.0–17.1)
MCH: 32.2 pg (ref 27.2–33.4)
MCHC: 34 g/dL (ref 32.0–36.0)
MCV: 94.5 fL (ref 79.3–98.0)
MONO%: 9.6 % (ref 0.0–14.0)
RBC: 3.84 10*6/uL — ABNORMAL LOW (ref 4.20–5.82)
RDW: 18.4 % — ABNORMAL HIGH (ref 11.0–14.6)
lymph#: 0.7 10*3/uL — ABNORMAL LOW (ref 0.9–3.3)

## 2012-05-28 LAB — COMPREHENSIVE METABOLIC PANEL (CC13)
ALT: 18 U/L (ref 0–55)
AST: 17 U/L (ref 5–34)
Albumin: 3 g/dL — ABNORMAL LOW (ref 3.5–5.0)
Alkaline Phosphatase: 64 U/L (ref 40–150)
BUN: 21.1 mg/dL (ref 7.0–26.0)
Calcium: 9 mg/dL (ref 8.4–10.4)
Chloride: 102 mEq/L (ref 98–107)
Potassium: 4.3 mEq/L (ref 3.5–5.1)
Sodium: 138 mEq/L (ref 136–145)
Total Protein: 7 g/dL (ref 6.4–8.3)

## 2012-05-28 NOTE — Patient Instructions (Signed)
No evidence for disease recurrence on the recent scan. Followup visit in 3 months with repeat CT scan of the chest.
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
## 2012-06-03 ENCOUNTER — Other Ambulatory Visit: Payer: Self-pay | Admitting: *Deleted

## 2012-06-04 ENCOUNTER — Other Ambulatory Visit: Payer: Medicare Other

## 2012-06-05 ENCOUNTER — Other Ambulatory Visit: Payer: Self-pay

## 2012-06-05 ENCOUNTER — Emergency Department (HOSPITAL_COMMUNITY)
Admission: EM | Admit: 2012-06-05 | Discharge: 2012-06-05 | Disposition: A | Discharge status: Home / Self Care | Payer: Medicare Other | Attending: Emergency Medicine | Admitting: Emergency Medicine

## 2012-06-05 ENCOUNTER — Encounter (HOSPITAL_COMMUNITY): Payer: Self-pay | Admitting: Emergency Medicine

## 2012-06-05 ENCOUNTER — Emergency Department (HOSPITAL_COMMUNITY): Payer: Medicare Other

## 2012-06-05 DIAGNOSIS — Z87891 Personal history of nicotine dependence: Secondary | ICD-10-CM | POA: Insufficient documentation

## 2012-06-05 DIAGNOSIS — Z8679 Personal history of other diseases of the circulatory system: Secondary | ICD-10-CM | POA: Insufficient documentation

## 2012-06-05 DIAGNOSIS — J189 Pneumonia, unspecified organism: Secondary | ICD-10-CM

## 2012-06-05 DIAGNOSIS — E785 Hyperlipidemia, unspecified: Secondary | ICD-10-CM | POA: Insufficient documentation

## 2012-06-05 DIAGNOSIS — Z87828 Personal history of other (healed) physical injury and trauma: Secondary | ICD-10-CM | POA: Insufficient documentation

## 2012-06-05 DIAGNOSIS — Z85118 Personal history of other malignant neoplasm of bronchus and lung: Secondary | ICD-10-CM | POA: Insufficient documentation

## 2012-06-05 DIAGNOSIS — R011 Cardiac murmur, unspecified: Secondary | ICD-10-CM | POA: Insufficient documentation

## 2012-06-05 DIAGNOSIS — I10 Essential (primary) hypertension: Secondary | ICD-10-CM | POA: Insufficient documentation

## 2012-06-05 DIAGNOSIS — Z7982 Long term (current) use of aspirin: Secondary | ICD-10-CM | POA: Insufficient documentation

## 2012-06-05 DIAGNOSIS — Z79899 Other long term (current) drug therapy: Secondary | ICD-10-CM | POA: Insufficient documentation

## 2012-06-05 LAB — COMPREHENSIVE METABOLIC PANEL
Albumin: 3.3 g/dL — ABNORMAL LOW (ref 3.5–5.2)
BUN: 23 mg/dL (ref 6–23)
Calcium: 9.4 mg/dL (ref 8.4–10.5)
Chloride: 98 mEq/L (ref 96–112)
Creatinine, Ser: 1.06 mg/dL (ref 0.50–1.35)
GFR calc non Af Amer: 71 mL/min — ABNORMAL LOW (ref >90)
Total Bilirubin: 0.4 mg/dL (ref 0.3–1.2)

## 2012-06-05 LAB — CBC WITH DIFFERENTIAL/PLATELET
Hemoglobin: 13.1 g/dL (ref 13.0–17.0)
Lymphocytes Relative: 10 % — ABNORMAL LOW (ref 12–46)
Lymphs Abs: 0.7 10*3/uL (ref 0.7–4.0)
MCV: 92.5 fL (ref 78.0–100.0)
Monocytes Relative: 2 % — ABNORMAL LOW (ref 3–12)
Neutrophils Relative %: 88 % — ABNORMAL HIGH (ref 43–77)
Platelets: 159 10*3/uL (ref 150–400)
RBC: 4.02 MIL/uL — ABNORMAL LOW (ref 4.22–5.81)
WBC: 7.2 10*3/uL (ref 4.0–10.5)

## 2012-06-05 LAB — PRO B NATRIURETIC PEPTIDE: Pro B Natriuretic peptide (BNP): 71.2 pg/mL (ref 0–125)

## 2012-06-05 LAB — D-DIMER, QUANTITATIVE: D-Dimer, Quant: 1.38 ug/mL-FEU — ABNORMAL HIGH (ref 0.00–0.48)

## 2012-06-05 MED ORDER — IOHEXOL 350 MG/ML SOLN
100.0000 mL | Freq: Once | INTRAVENOUS | Status: AC | PRN
  Administered 2012-06-05: 80 mL via INTRAVENOUS

## 2012-06-05 MED ORDER — ASPIRIN 81 MG PO CHEW
324.0000 mg | CHEWABLE_TABLET | Freq: Once | ORAL | Status: AC
  Administered 2012-06-05: 324 mg via ORAL
  Filled 2012-06-05: qty 4

## 2012-06-05 MED ORDER — LEVOFLOXACIN 500 MG PO TABS: 500.0000 mg | ORAL_TABLET | Freq: Every day | ORAL | Status: DC

## 2012-06-05 MED ORDER — FUROSEMIDE 10 MG/ML IJ SOLN
80.0000 mg | Freq: Once | INTRAMUSCULAR | Status: AC
  Administered 2012-06-05: 80 mg via INTRAVENOUS
  Filled 2012-06-05: qty 8
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
## 2012-06-05 NOTE — ED Provider Notes (Signed)
Pt's cxr shows possible pna.  He admits to not being compliant with his diuretics every day.  Encouraged him to take his daily dose.  Pt will be started on oral antibiotics.  Follow up with pcp this week.  Pt is comfortable with plan.  Celene Kras, MD 06/05/12 [phone]

## 2012-06-05 NOTE — ED Notes (Signed)
Patient transported to X-ray 

## 2012-06-05 NOTE — ED Notes (Signed)
Sob for the last couple of days worse yesterday feet swelling no cp , states takes a fluid pill got dx w/ lung ca last nov did chemo. Last one week before last saw dr and was told to check back in 3 months

## 2012-06-12 ENCOUNTER — Inpatient Hospital Stay (HOSPITAL_COMMUNITY)
Admission: EM | Admit: 2012-06-12 | Discharge: 2012-06-14 | DRG: 194 | Disposition: A | Discharge status: Home / Self Care | Payer: Medicare Other | Attending: Internal Medicine | Admitting: Internal Medicine

## 2012-06-12 ENCOUNTER — Emergency Department (HOSPITAL_COMMUNITY): Payer: Medicare Other

## 2012-06-12 ENCOUNTER — Encounter (HOSPITAL_COMMUNITY): Payer: Self-pay | Admitting: Emergency Medicine

## 2012-06-12 DIAGNOSIS — R0602 Shortness of breath: Secondary | ICD-10-CM

## 2012-06-12 DIAGNOSIS — R7302 Impaired glucose tolerance (oral): Secondary | ICD-10-CM

## 2012-06-12 DIAGNOSIS — E785 Hyperlipidemia, unspecified: Secondary | ICD-10-CM

## 2012-06-12 DIAGNOSIS — E669 Obesity, unspecified: Secondary | ICD-10-CM

## 2012-06-12 DIAGNOSIS — Z6841 Body Mass Index (BMI) 40.0 and over, adult: Secondary | ICD-10-CM

## 2012-06-12 DIAGNOSIS — J189 Pneumonia, unspecified organism: Principal | ICD-10-CM

## 2012-06-12 DIAGNOSIS — Z87891 Personal history of nicotine dependence: Secondary | ICD-10-CM

## 2012-06-12 DIAGNOSIS — J984 Other disorders of lung: Secondary | ICD-10-CM

## 2012-06-12 DIAGNOSIS — R7309 Other abnormal glucose: Secondary | ICD-10-CM

## 2012-06-12 DIAGNOSIS — C3490 Malignant neoplasm of unspecified part of unspecified bronchus or lung: Secondary | ICD-10-CM

## 2012-06-12 DIAGNOSIS — Z85118 Personal history of other malignant neoplasm of bronchus and lung: Secondary | ICD-10-CM

## 2012-06-12 DIAGNOSIS — J90 Pleural effusion, not elsewhere classified: Secondary | ICD-10-CM | POA: Diagnosis present

## 2012-06-12 DIAGNOSIS — E8779 Other fluid overload: Secondary | ICD-10-CM | POA: Diagnosis present

## 2012-06-12 DIAGNOSIS — I10 Essential (primary) hypertension: Secondary | ICD-10-CM

## 2012-06-12 DIAGNOSIS — I872 Venous insufficiency (chronic) (peripheral): Secondary | ICD-10-CM

## 2012-06-12 HISTORY — DX: Shortness of breath: R06.02

## 2012-06-12 HISTORY — DX: Unspecified osteoarthritis, unspecified site: M19.90

## 2012-06-12 LAB — CBC
Hemoglobin: 11.9 g/dL — ABNORMAL LOW (ref 13.0–17.0)
RBC: 3.69 MIL/uL — ABNORMAL LOW (ref 4.22–5.81)

## 2012-06-12 LAB — BASIC METABOLIC PANEL
CO2: 27 mEq/L (ref 19–32)
Chloride: 104 mEq/L (ref 96–112)
GFR calc Af Amer: 81 mL/min — ABNORMAL LOW (ref >90)
Glucose, Bld: 130 mg/dL — ABNORMAL HIGH (ref 70–99)
Potassium: 4.2 mEq/L (ref 3.5–5.1)
Sodium: 140 mEq/L (ref 135–145)

## 2012-06-12 LAB — PRO B NATRIURETIC PEPTIDE: Pro B Natriuretic peptide (BNP): 44.5 pg/mL (ref 0–125)

## 2012-06-12 MED ORDER — FOLIC ACID 1 MG PO TABS
1.0000 mg | ORAL_TABLET | Freq: Every day | ORAL | Status: DC
Start: 2012-06-12 — End: 2012-06-14
  Administered 2012-06-12 – 2012-06-14 (×3): 1 mg via ORAL
  Filled 2012-06-12 (×3): qty 1

## 2012-06-12 MED ORDER — HEPARIN SODIUM (PORCINE) 5000 UNIT/ML IJ SOLN
5000.0000 [IU] | Freq: Three times a day (TID) | INTRAMUSCULAR | Status: DC
  Administered 2012-06-12 – 2012-06-14 (×5): 5000 [IU] via SUBCUTANEOUS
  Filled 2012-06-12 (×9): qty 1

## 2012-06-12 MED ORDER — DEXTROSE 5 % IV SOLN
500.0000 mg | INTRAVENOUS | Status: DC
  Administered 2012-06-13: 500 mg via INTRAVENOUS
  Filled 2012-06-12: qty 500

## 2012-06-12 MED ORDER — DEXTROSE 5 % IV SOLN
1.0000 g | INTRAVENOUS | Status: DC
  Administered 2012-06-13: 1 g via INTRAVENOUS
  Filled 2012-06-12: qty 10

## 2012-06-12 MED ORDER — ATORVASTATIN CALCIUM 40 MG PO TABS
40.0000 mg | ORAL_TABLET | Freq: Every day | ORAL | Status: DC
  Administered 2012-06-13: 40 mg via ORAL
  Filled 2012-06-12 (×2): qty 1

## 2012-06-12 MED ORDER — FUROSEMIDE 10 MG/ML IJ SOLN
40.0000 mg | Freq: Three times a day (TID) | INTRAMUSCULAR | Status: DC
  Administered 2012-06-12 – 2012-06-14 (×5): 40 mg via INTRAVENOUS
  Filled 2012-06-12 (×9): qty 4

## 2012-06-12 MED ORDER — ASPIRIN EC 81 MG PO TBEC
81.0000 mg | DELAYED_RELEASE_TABLET | Freq: Every day | ORAL | Status: DC
  Administered 2012-06-13 – 2012-06-14 (×2): 81 mg via ORAL
  Filled 2012-06-12 (×2): qty 1

## 2012-06-12 MED ORDER — DEXTROSE 5 % IV SOLN
1.0000 g | Freq: Once | INTRAVENOUS | Status: AC
  Administered 2012-06-12: 1 g via INTRAVENOUS
  Filled 2012-06-12: qty 10

## 2012-06-12 MED ORDER — ALBUTEROL SULFATE (5 MG/ML) 0.5% IN NEBU
2.5000 mg | INHALATION_SOLUTION | Freq: Four times a day (QID) | RESPIRATORY_TRACT | Status: DC
  Filled 2012-06-12: qty 0.5

## 2012-06-12 MED ORDER — ALBUTEROL SULFATE (5 MG/ML) 0.5% IN NEBU
5.0000 mg | INHALATION_SOLUTION | Freq: Once | RESPIRATORY_TRACT | Status: AC
  Administered 2012-06-12: 5 mg via RESPIRATORY_TRACT
  Filled 2012-06-12: qty 0.5
  Filled 2012-06-12: qty 1
  Filled 2012-06-12: qty 0.5

## 2012-06-12 MED ORDER — HYDROCOD POLST-CHLORPHEN POLST 10-8 MG/5ML PO LQCR
5.0000 mL | Freq: Once | ORAL | Status: AC
  Administered 2012-06-12: 5 mL via ORAL
  Filled 2012-06-12: qty 5

## 2012-06-12 MED ORDER — SODIUM CHLORIDE 0.9 % IV SOLN: INTRAVENOUS | Status: DC

## 2012-06-12 MED ORDER — CARVEDILOL 3.125 MG PO TABS
3.1250 mg | ORAL_TABLET | Freq: Two times a day (BID) | ORAL | Status: DC
  Administered 2012-06-13 – 2012-06-14 (×3): 3.125 mg via ORAL
  Filled 2012-06-12 (×5): qty 1

## 2012-06-12 MED ORDER — IPRATROPIUM BROMIDE 0.02 % IN SOLN
0.5000 mg | Freq: Four times a day (QID) | RESPIRATORY_TRACT | Status: DC
  Filled 2012-06-12: qty 2.5

## 2012-06-12 MED ORDER — DEXTROSE 5 % IV SOLN: 500.0000 mg | Freq: Once | INTRAVENOUS | Status: DC
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
## 2012-06-12 NOTE — ED Provider Notes (Signed)
Medical screening examination/treatment/procedure(s) were performed by non-physician practitioner and as supervising physician I was immediately available for consultation/collaboration.    Celene Kras, MD 06/12/12 [phone]
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
## 2012-06-12 NOTE — ED Notes (Signed)
Pt sts increased SOB and cough; pt sts seen here recently for same and given antibiotics for PNA; pt sts taking meds but not improved; pt sts some swelling in legs

## 2012-06-12 NOTE — ED Notes (Signed)
Family at bedside. 

## 2012-06-13 DIAGNOSIS — C3490 Malignant neoplasm of unspecified part of unspecified bronchus or lung: Secondary | ICD-10-CM

## 2012-06-13 DIAGNOSIS — E785 Hyperlipidemia, unspecified: Secondary | ICD-10-CM

## 2012-06-13 DIAGNOSIS — J189 Pneumonia, unspecified organism: Secondary | ICD-10-CM

## 2012-06-13 LAB — CBC
HCT: 37.6 % — ABNORMAL LOW (ref 39.0–52.0)
MCHC: 33.8 g/dL (ref 30.0–36.0)
MCV: 96.7 fL (ref 78.0–100.0)
Platelets: 155 10*3/uL (ref 150–400)
RDW: 17.6 % — ABNORMAL HIGH (ref 11.5–15.5)

## 2012-06-13 LAB — BASIC METABOLIC PANEL
BUN: 20 mg/dL (ref 6–23)
CO2: 27 mEq/L (ref 19–32)
Calcium: 9.7 mg/dL (ref 8.4–10.5)
Chloride: 100 mEq/L (ref 96–112)
Creatinine, Ser: 1.07 mg/dL (ref 0.50–1.35)

## 2012-06-13 LAB — LEGIONELLA ANTIGEN, URINE: Legionella Antigen, Urine: NEGATIVE

## 2012-06-13 MED ORDER — IPRATROPIUM BROMIDE 0.02 % IN SOLN
0.5000 mg | Freq: Three times a day (TID) | RESPIRATORY_TRACT | Status: DC
  Administered 2012-06-13 – 2012-06-14 (×4): 0.5 mg via RESPIRATORY_TRACT
  Filled 2012-06-13 (×4): qty 2.5

## 2012-06-13 MED ORDER — ALBUTEROL SULFATE (5 MG/ML) 0.5% IN NEBU
2.5000 mg | INHALATION_SOLUTION | Freq: Three times a day (TID) | RESPIRATORY_TRACT | Status: DC
  Administered 2012-06-13 – 2012-06-14 (×4): 2.5 mg via RESPIRATORY_TRACT
  Filled 2012-06-13 (×4): qty 0.5

## 2012-06-13 MED ORDER — LEVOFLOXACIN 750 MG PO TABS
750.0000 mg | ORAL_TABLET | Freq: Every day | ORAL | Status: DC
  Administered 2012-06-13 – 2012-06-14 (×2): 750 mg via ORAL
  Filled 2012-06-13 (×2): qty 1

## 2012-06-13 MED ORDER — ALBUTEROL SULFATE (5 MG/ML) 0.5% IN NEBU: 2.5000 mg | INHALATION_SOLUTION | Freq: Four times a day (QID) | RESPIRATORY_TRACT | Status: DC | PRN

## 2012-06-13 NOTE — Progress Notes (Signed)
  Echocardiogram 2D Echocardiogram has been performed.  Georgian Co 06/13/2012, 5:41 PM

## 2012-06-13 NOTE — Progress Notes (Signed)
[facility] PROGRESS NOTE  Assessment/Plan: *Pneumonia: - Evident on previous CT scan 5.15.2014 - On IV rocephin and 5.14.2014. Change to levaquin on 5.15.2014. - afebrile, no cough.  Lower extremity edema  - Patient reported it is secondary to venous insufficiency, no recent echocardiogram support CHF. Echo pending. -  low proBNP, cont lasix 40 IV Q8hrs.  Hypertension  -Continue home medications including Coreg, ACE inhibitor was held because of the aggressive diuresis.    Obesity - counseling.   Lung cancer - follow up with Dr. Arbutus Ped as an outpatient.    Code Status: full Family Communication: none  Disposition Plan: home in am   Consultants:  none  Procedures:  none  Antibiotics:  Rocephin azithro  HPI/Subjective: No complains feels better  Objective: Filed Vitals:   06/12/12 2211 06/13/12 0543 06/13/12 0741 06/13/12 0804  BP: 129/65 146/71  122/84  Pulse: 71 68  85  Temp: 98.4 F (36.9 C) 98.7 F (37.1 C)    TempSrc: Oral Oral    Resp: 20 20    SpO2: 96% 96% 97%     Intake/Output Summary (Last 24 hours) at 06/13/12 1227 Last data filed at 06/13/12 0955  Gross per 24 hour  Intake    360 ml  Output   1550 ml  Net  -1190 ml   There were no vitals filed for this visit.  Exam:  General: Alert, awake, oriented x3, in no acute distress.  HEENT: No bruits, no goiter.  Heart: Regular rate and rhythm, without murmurs, rubs, gallops.  Lungs: Good air movement, bilateral air movement.  Abdomen: Soft, nontender, nondistended, positive bowel sounds.  Neuro: Grossly intact, nonfocal.   Data Reviewed: Basic Metabolic Panel:  Recent Labs Lab 06/12/12 1322 06/13/12 0527  NA 140 139  K 4.2 4.5  CL 104 100  CO2 27 27  GLUCOSE 130* 118*  BUN 22 20  CREATININE 1.08 1.07  CALCIUM 9.2 9.7   Liver Function Tests: No results found for this basename: AST, ALT, ALKPHOS, BILITOT, PROT, ALBUMIN,  in the last 168 hours No results found for  this basename: LIPASE, AMYLASE,  in the last 168 hours No results found for this basename: AMMONIA,  in the last 168 hours CBC:  Recent Labs Lab 06/12/12 1322 06/13/12 0527  WBC 5.9 5.4  HGB 11.9* 12.7*  HCT 35.3* 37.6*  MCV 95.7 96.7  PLT 161 155   Cardiac Enzymes: No results found for this basename: CKTOTAL, CKMB, CKMBINDEX, TROPONINI,  in the last 168 hours BNP (last 3 results)  Recent Labs  06/05/12 1402 06/12/12 1322  PROBNP 71.2 44.5   CBG: No results found for this basename: GLUCAP,  in the last 168 hours  No results found for this or any previous visit (from the past 240 hour(s)).   Studies: Dg Chest 2 View  06/12/2012   *RADIOLOGY REPORT*  Clinical Data:  Shortness of breath  CHEST - 2 VIEW  Comparison: Two-view chest x-ray 06/05/2012.  CT chest 06/05/2012.  Findings: The heart is enlarged.  Chronic fibrotic changes are again noted in the lungs.  Superimposed left lower lobe airspace disease has progressed.  Small bilateral pleural effusions are suspected.  IMPRESSION:  1.  Stable cardiomegaly and mild pulmonary vascular congestion. 2.  Progressive left lower lobe airspace disease is concerning for pneumonia.   Original Report Authenticated By: Marin Roberts, M.D.    Scheduled Meds: . albuterol  2.5 mg Nebulization TID  . aspirin EC  81 mg Oral Daily  .  atorvastatin  40 mg Oral q1800  . azithromycin (ZITHROMAX) 500 MG IVPB  500 mg Intravenous Once  . azithromycin  500 mg Intravenous Q24H  . carvedilol  3.125 mg Oral BID WC  . cefTRIAXone (ROCEPHIN)  IV  1 g Intravenous Q24H  . folic acid  1 mg Oral Daily  . furosemide  40 mg Intravenous Q8H  . heparin  5,000 Units Subcutaneous Q8H  . ipratropium  0.5 mg Nebulization TID   Continuous Infusions: . sodium chloride 10 mL/hr at 06/12/12 2215     Marinda Elk  [facility] Pager [phone]. If 8PM-8AM, please contact night-coverage at [email or web address], password [facility] 06/13/2012, 12:27 PM  LOS: 1  day

## 2012-06-14 DIAGNOSIS — I10 Essential (primary) hypertension: Secondary | ICD-10-CM

## 2012-06-14 MED ORDER — FUROSEMIDE 80 MG PO TABS: 80.0000 mg | ORAL_TABLET | Freq: Two times a day (BID) | ORAL | Status: DC

## 2012-06-14 MED ORDER — LEVOFLOXACIN 750 MG PO TABS: 750.0000 mg | ORAL_TABLET | Freq: Every day | ORAL | Status: DC

## 2012-06-14 NOTE — Discharge Summary (Signed)
Physician Discharge Summary  [name] ZOX:[id number] DOB: 1945/04/02 DOA: 06/12/2012  PCP: Robynn Pane, MD  Admit date: 06/12/2012 Discharge date: 06/14/2012  Time spent: 35  minutes  Recommendations for Outpatient Follow-up:  1. Follow up with Dr. Arbutus Ped  Discharge Diagnoses:  Principal Problem:   Pneumonia Active Problems:   Lung cancer   Hypertension   Restrictive lung disease secondary to obesity   Obesity   Discharge Condition: stable  Diet recommendation: heart healthy  Filed Weights   06/14/12 0659  Weight: 151.365 kg (333 lb 11.2 oz)    History of present illness:  See H&P for further details 67 y.o. African American male with benzoyl history of hypertension, dyslipidemia and left sided lung cancer status post LL lobectomy. Patient came into the emergency department complaining about shortness of breath for the past several weeks. Patient said the shortness of breath increases with exertion, associated with cough and minimal sputum production. She denies any fever or chills. Patient evaluated in the emergency department on 06/05/2012 by CT angio of the chest to cause of elevated D. dimers and showed no evidence of PE but questionable left-sided pneumonia   Hospital Course:  *Pneumonia:  - Evident on previous CT scan 5.15.2014  - On IV rocephin and azythromacin on admission. 5.14.2014 Change to levaquin on 5.15.2014.  Will continue as an outpatient. - afebrile, no cough.   Lower extremity edema  - Patient reported it is secondary to venous insufficiency, no recent echocardiogram support CHF. Echo pending.  - low proBNP, cont home dose of lasix.  Hypertension  -Continue home medications including Coreg, ACE inhibitor was held because of the aggressive diuresis.  Obesity  - counseling.   Lung cancer  - follow up with Dr. Arbutus Ped as an outpatient.   Procedures:  CT angio 5.14.2014   Consultations:  none  Discharge Exam: Filed Vitals:    06/13/12 1922 06/13/12 2100 06/14/12 0659 06/14/12 0920  BP:  138/80 114/57   Pulse:  80 83   Temp:  97.6 F (36.4 C) 98 F (36.7 C)   TempSrc:   Oral   Resp:  18    Weight:   151.365 kg (333 lb 11.2 oz)   SpO2: 98% 90% 93% 91%    General: A&O x3 Cardiovascular: RRR Respiratory: good air movement CTA b/l  Discharge Instructions  Discharge Orders   Future Appointments Provider Department Dept Phone   08/27/2012 9:45 AM [facility] Only [facility] [phone]   08/27/2012 10:30 AM [facility] 2 [facility] [phone]   Patient to arrive 15 minutes prior to appointment time. No solid food 4 hours prior to exam. Liquids and Medicines are okay.   08/28/2012 10:30 AM Si Gaul, MD [facility] [phone]   Future Orders Complete By Expires     Diet - low sodium heart healthy  As directed     Increase activity slowly  As directed         Medication List    TAKE these medications       aspirin EC 81 MG tablet  Take 81 mg by mouth daily.     atorvastatin 40 MG tablet  Commonly known as:  LIPITOR  Take 1 tablet by mouth daily.     carvedilol 3.125 MG tablet  Commonly known as:  COREG  Take 3.125 mg by mouth 2 (two) times daily with a meal.     CRESTOR 10 MG tablet  Generic drug:  rosuvastatin  Take 10 mg by mouth daily.     folic acid 1 MG tablet  Commonly known as:  FOLVITE  Take 1 tablet (1 mg total) by mouth daily.     furosemide 80 MG tablet  Commonly known as:  LASIX  Take 1 tablet (80 mg total) by mouth 2 (two) times daily.     levofloxacin 750 MG tablet  Commonly known as:  LEVAQUIN  Take 1 tablet (750 mg total) by mouth daily.     potassium chloride SA 20 MEQ tablet  Commonly known as:  K-DUR,KLOR-CON  Take 20 mEq by mouth 2 (two) times daily.     ramipril 5 MG capsule  Commonly known as:  ALTACE  Take 5 mg by mouth daily.       No Known Allergies      Follow-up Information   Follow up with Robynn Pane, MD In 3 weeks.   Contact information:   [address] [place] [address] [phone]       Follow up with Lajuana Matte., MD. (today)    Contact information:   [address] [place] [address] [phone]        The results of significant diagnostics from this hospitalization (including imaging, microbiology, ancillary and laboratory) are listed below for reference.    Significant Diagnostic Studies: Dg Chest 2 View  06/12/2012   *RADIOLOGY REPORT*  Clinical Data:  Shortness of breath  CHEST - 2 VIEW  Comparison: Two-view chest x-ray 06/05/2012.  CT chest 06/05/2012.  Findings: The heart is enlarged.  Chronic fibrotic changes are again noted in the lungs.  Superimposed left lower lobe airspace disease has progressed.  Small bilateral pleural effusions are suspected.  IMPRESSION:  1.  Stable cardiomegaly and mild pulmonary vascular congestion. 2.  Progressive left lower lobe airspace disease is concerning for pneumonia.   Original Report Authenticated By: Marin Roberts, M.D.   Dg Chest 2 View  06/05/2012   *RADIOLOGY REPORT*  Clinical Data: Shortness of breath. History of left lower lobectomy for lung carcinoma.  CHEST - 2 VIEW  Comparison: 02/27/2012  Findings: Lungs show evidence of pulmonary edema with associated cardiomegaly.  Findings are suggestive of congestive heart failure. No significant pleural effusions are present.  Chronic volume loss at the left lung base is present related to left lower  lobectomy.  IMPRESSION: Findings consistent with congestive heart failure.  Chronic volume loss at the left lung base post operatively.   Original Report Authenticated By: Irish Lack, M.D.   Ct Chest W Contrast  05/24/2012   *RADIOLOGY REPORT*  Clinical Data: Restaging non-small cell lung cancer  CT CHEST WITH CONTRAST  Technique:  Multidetector CT imaging of the chest was performed following  the standard protocol during bolus administration of intravenous contrast.  Contrast: OMNIPAQUE IOHEXOL 300 MG/ML  SOLN  Comparison: PET CT from 12/06/2011  Findings: Lungs/pleura: Status post left lower lobectomy.  Fibrotic changes are noted within the lower left lung.  There are moderate changes of centrilobular and paraseptal emphysema identified.  No suspicious pulmonary nodule or mass identified.  Heart/Mediastinum: Heart size is normal.  There is a left paratracheal lymph node which measures 1 cm, image 21/series 3. Previously this measured 8 mm.  Left hilar lymph node measures 1.2 cm, image 28/series 3.  No supraclavicular or axillary adenopathy.  Upper abdomen: Limited imaging through the upper abdomen shows no acute findings.  The adrenal glands both appear within  normal limits.  Bones/Musculoskeletal:  Review of the visualized osseous structures is significant for mild multilevel spondylosis in the thoracic spine.  No worrisome lytic or sclerotic bone lesions.  IMPRESSION:  1.  No acute findings. 2.  Status post left lower lobectomy.  No evidence for residual or recurrent local tumor. 3.  Borderline left hilar and left paratracheal lymph nodes. Attention on follow-up imaging is advised. 3. Emphysema.   Original Report Authenticated By: Signa Kell, M.D.   Ct Angio Chest W/cm &/or Wo Cm  06/05/2012   *RADIOLOGY REPORT*  Clinical Data: Shortness of breath.  History of lung cancer with previous left lower lobectomy.  CT ANGIOGRAPHY CHEST  Technique:  Multidetector CT imaging of the chest using the standard protocol during bolus administration of intravenous contrast. Multiplanar reconstructed images including MIPs were obtained and reviewed to evaluate the vascular anatomy.  Contrast: 80mL OMNIPAQUE IOHEXOL 350 MG/ML SOLN  Comparison: 05/24/2012  Findings: No filling defect is identified within the opacified pulmonary arteries to suggest the presence of an acute pulmonary embolus.  No thoracic  aortic aneurysm.  No dissection of the thoracic aorta.  There is no axillary lymphadenopathy.  Scattered mediastinal lymph nodes are increased in number, but do not meet CT criteria for pathologic enlargement.  Heart size is enlarged.  No pericardial or pleural effusion.  Lung windows reveal advanced emphysema.  There is new airspace disease in the posterior left lung (image 49 series 5).  The bronchiectasis and chronic interstitial changes in the left lung base are stable.  Similar but less pronounced chronic interstitial disease is seen in the posterior right base.  Bone windows reveal no worrisome lytic or sclerotic osseous lesions.  IMPRESSION: No CT evidence for acute pulmonary embolus.  New focal airspace disease in the posterior left lung with persistent chronic interstitial/fibrotic changes in the posterior left lower lung.  This may reflect interval development of posterior left lung pneumonia.   Original Report Authenticated By: Kennith Center, M.D.    Microbiology: Recent Results (from the past 240 hour(s))  CULTURE, BLOOD (ROUTINE X 2)     Status: None   Collection Time    06/12/12 11:53 PM      Result Value Range Status   Specimen Description BLOOD RIGHT HAND   Final   Special Requests BOTTLES DRAWN AEROBIC ONLY 1CC   Final   Culture  Setup Time 06/13/2012 05:42   Final   Culture     Final   Value:        BLOOD CULTURE RECEIVED NO GROWTH TO DATE CULTURE WILL BE HELD FOR 5 DAYS BEFORE ISSUING A FINAL NEGATIVE REPORT   Report Status PENDING   Incomplete  CULTURE, BLOOD (ROUTINE X 2)     Status: None   Collection Time    06/12/12 11:53 PM      Result Value Range Status   Specimen Description BLOOD LEFT ARM   Final   Special Requests BOTTLES DRAWN AEROBIC ONLY 1CC   Final   Culture  Setup Time 06/13/2012 05:42   Final   Culture     Final   Value:        BLOOD CULTURE RECEIVED NO GROWTH TO DATE CULTURE WILL BE HELD FOR 5 DAYS BEFORE ISSUING A FINAL NEGATIVE REPORT   Report Status  PENDING   Incomplete     Labs: Basic Metabolic Panel:  Recent Labs Lab 06/12/12 1322 06/13/12 0527  NA 140 139  K 4.2 4.5  CL 104 100  CO2 27 27  GLUCOSE 130* 118*  BUN 22 20  CREATININE 1.08 1.07  CALCIUM 9.2 9.7   Liver Function Tests: No results found for this basename: AST, ALT, ALKPHOS, BILITOT, PROT, ALBUMIN,  in the last 168 hours No results found for this basename: LIPASE, AMYLASE,  in the last 168 hours No results found for this basename: AMMONIA,  in the last 168 hours CBC:  Recent Labs Lab 06/12/12 1322 06/13/12 0527  WBC 5.9 5.4  HGB 11.9* 12.7*  HCT 35.3* 37.6*  MCV 95.7 96.7  PLT 161 155   Cardiac Enzymes: No results found for this basename: CKTOTAL, CKMB, CKMBINDEX, TROPONINI,  in the last 168 hours BNP: BNP (last 3 results)  Recent Labs  06/05/12 1402 06/12/12 1322  PROBNP 71.2 44.5   CBG: No results found for this basename: GLUCAP,  in the last 168 hours     Signed:  Marinda Elk  [facility] 06/14/2012, 11:11 AM

## 2012-06-19 LAB — CULTURE, BLOOD (ROUTINE X 2): Culture: NO GROWTH

## 2012-06-22 ENCOUNTER — Encounter (HOSPITAL_COMMUNITY): Payer: Self-pay | Admitting: Emergency Medicine

## 2012-06-22 ENCOUNTER — Inpatient Hospital Stay (HOSPITAL_COMMUNITY)
Admission: EM | Admit: 2012-06-22 | Discharge: 2012-06-24 | DRG: 181 | Disposition: A | Discharge status: Home / Self Care | Payer: Medicare Other | Attending: Internal Medicine | Admitting: Internal Medicine

## 2012-06-22 ENCOUNTER — Emergency Department (HOSPITAL_COMMUNITY): Payer: Medicare Other

## 2012-06-22 DIAGNOSIS — I872 Venous insufficiency (chronic) (peripheral): Secondary | ICD-10-CM | POA: Diagnosis present

## 2012-06-22 DIAGNOSIS — J984 Other disorders of lung: Secondary | ICD-10-CM | POA: Diagnosis present

## 2012-06-22 DIAGNOSIS — R918 Other nonspecific abnormal finding of lung field: Secondary | ICD-10-CM

## 2012-06-22 DIAGNOSIS — I10 Essential (primary) hypertension: Secondary | ICD-10-CM | POA: Diagnosis present

## 2012-06-22 DIAGNOSIS — I509 Heart failure, unspecified: Secondary | ICD-10-CM | POA: Diagnosis present

## 2012-06-22 DIAGNOSIS — E785 Hyperlipidemia, unspecified: Secondary | ICD-10-CM | POA: Diagnosis present

## 2012-06-22 DIAGNOSIS — R7309 Other abnormal glucose: Secondary | ICD-10-CM | POA: Diagnosis present

## 2012-06-22 DIAGNOSIS — R0609 Other forms of dyspnea: Secondary | ICD-10-CM

## 2012-06-22 DIAGNOSIS — E860 Dehydration: Secondary | ICD-10-CM | POA: Diagnosis present

## 2012-06-22 DIAGNOSIS — I5032 Chronic diastolic (congestive) heart failure: Secondary | ICD-10-CM | POA: Diagnosis present

## 2012-06-22 DIAGNOSIS — C3490 Malignant neoplasm of unspecified part of unspecified bronchus or lung: Secondary | ICD-10-CM | POA: Diagnosis present

## 2012-06-22 DIAGNOSIS — G4733 Obstructive sleep apnea (adult) (pediatric): Secondary | ICD-10-CM | POA: Diagnosis present

## 2012-06-22 DIAGNOSIS — E669 Obesity, unspecified: Secondary | ICD-10-CM | POA: Diagnosis present

## 2012-06-22 DIAGNOSIS — Z87891 Personal history of nicotine dependence: Secondary | ICD-10-CM

## 2012-06-22 DIAGNOSIS — R0989 Other specified symptoms and signs involving the circulatory and respiratory systems: Secondary | ICD-10-CM

## 2012-06-22 DIAGNOSIS — Z6841 Body Mass Index (BMI) 40.0 and over, adult: Secondary | ICD-10-CM

## 2012-06-22 DIAGNOSIS — R5381 Other malaise: Secondary | ICD-10-CM | POA: Diagnosis present

## 2012-06-22 DIAGNOSIS — N179 Acute kidney failure, unspecified: Secondary | ICD-10-CM | POA: Diagnosis present

## 2012-06-22 DIAGNOSIS — M129 Arthropathy, unspecified: Secondary | ICD-10-CM | POA: Diagnosis present

## 2012-06-22 LAB — CBC WITH DIFFERENTIAL/PLATELET
Basophils Absolute: 0 10*3/uL (ref 0.0–0.1)
Basophils Relative: 0 % (ref 0–1)
Eosinophils Absolute: 0.1 10*3/uL (ref 0.0–0.7)
MCH: 33.4 pg (ref 26.0–34.0)
MCHC: 34.6 g/dL (ref 30.0–36.0)
Neutro Abs: 3.1 10*3/uL (ref 1.7–7.7)
Neutrophils Relative %: 66 % (ref 43–77)
Platelets: 208 10*3/uL (ref 150–400)
RDW: 16.6 % — ABNORMAL HIGH (ref 11.5–15.5)

## 2012-06-22 LAB — COMPREHENSIVE METABOLIC PANEL
AST: 21 U/L (ref 0–37)
Albumin: 3.1 g/dL — ABNORMAL LOW (ref 3.5–5.2)
Alkaline Phosphatase: 72 U/L (ref 39–117)
BUN: 29 mg/dL — ABNORMAL HIGH (ref 6–23)
Potassium: 3.9 mEq/L (ref 3.5–5.1)
Sodium: 137 mEq/L (ref 135–145)
Total Protein: 7.4 g/dL (ref 6.0–8.3)

## 2012-06-22 LAB — TROPONIN I: Troponin I: <0.3 ng/mL (ref <0.30)

## 2012-06-22 MED ORDER — FOLIC ACID 1 MG PO TABS
1.0000 mg | ORAL_TABLET | Freq: Every day | ORAL | Status: DC
  Administered 2012-06-23 – 2012-06-24 (×2): 1 mg via ORAL
  Filled 2012-06-22 (×2): qty 1

## 2012-06-22 MED ORDER — HEPARIN SODIUM (PORCINE) 5000 UNIT/ML IJ SOLN
5000.0000 [IU] | Freq: Three times a day (TID) | INTRAMUSCULAR | Status: DC
  Administered 2012-06-22 – 2012-06-24 (×6): 5000 [IU] via SUBCUTANEOUS
  Filled 2012-06-22 (×8): qty 1

## 2012-06-22 MED ORDER — SODIUM CHLORIDE 0.9 % IV SOLN
INTRAVENOUS | Status: AC
  Administered 2012-06-22: 17:00:00 via INTRAVENOUS

## 2012-06-22 MED ORDER — ASPIRIN EC 81 MG PO TBEC
81.0000 mg | DELAYED_RELEASE_TABLET | Freq: Every day | ORAL | Status: DC
  Administered 2012-06-23 – 2012-06-24 (×2): 81 mg via ORAL
  Filled 2012-06-22 (×2): qty 1

## 2012-06-22 MED ORDER — ACETAMINOPHEN 325 MG PO TABS: 650.0000 mg | ORAL_TABLET | Freq: Four times a day (QID) | ORAL | Status: DC | PRN

## 2012-06-22 MED ORDER — SODIUM CHLORIDE 0.9 % IV SOLN
INTRAVENOUS | Status: DC
  Administered 2012-06-22 – 2012-06-23 (×2): via INTRAVENOUS

## 2012-06-22 MED ORDER — ATORVASTATIN CALCIUM 40 MG PO TABS
40.0000 mg | ORAL_TABLET | Freq: Every day | ORAL | Status: DC
  Administered 2012-06-23 – 2012-06-24 (×2): 40 mg via ORAL
  Filled 2012-06-22 (×2): qty 1

## 2012-06-22 MED ORDER — CARVEDILOL 3.125 MG PO TABS
3.1250 mg | ORAL_TABLET | Freq: Two times a day (BID) | ORAL | Status: DC
  Administered 2012-06-23 – 2012-06-24 (×3): 3.125 mg via ORAL
  Filled 2012-06-22 (×6): qty 1

## 2012-06-22 MED ORDER — SODIUM CHLORIDE 0.9 % IV SOLN
INTRAVENOUS | Status: DC
  Administered 2012-06-22: 15:00:00 via INTRAVENOUS

## 2012-06-22 MED ORDER — ACETAMINOPHEN 650 MG RE SUPP: 650.0000 mg | Freq: Four times a day (QID) | RECTAL | Status: DC | PRN

## 2012-06-22 NOTE — ED Notes (Signed)
Pt moved to room 34 
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
## 2012-06-22 NOTE — ED Notes (Signed)
Attempted report 

## 2012-06-22 NOTE — ED Notes (Signed)
Pt presents to ED with c/o shortness of breath for 2 weeks now. Pt states he was discharged last Friday for pna and still does not feel any better. PT states he has great difficulty walking 83feet, sts he feels very short of breath.
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
## 2012-06-23 DIAGNOSIS — E669 Obesity, unspecified: Secondary | ICD-10-CM

## 2012-06-23 DIAGNOSIS — C3490 Malignant neoplasm of unspecified part of unspecified bronchus or lung: Principal | ICD-10-CM

## 2012-06-23 DIAGNOSIS — I10 Essential (primary) hypertension: Secondary | ICD-10-CM

## 2012-06-23 DIAGNOSIS — R918 Other nonspecific abnormal finding of lung field: Secondary | ICD-10-CM

## 2012-06-23 DIAGNOSIS — J984 Other disorders of lung: Secondary | ICD-10-CM

## 2012-06-23 DIAGNOSIS — R0602 Shortness of breath: Secondary | ICD-10-CM

## 2012-06-23 DIAGNOSIS — E785 Hyperlipidemia, unspecified: Secondary | ICD-10-CM

## 2012-06-23 DIAGNOSIS — R609 Edema, unspecified: Secondary | ICD-10-CM

## 2012-06-23 LAB — CBC
HCT: 31.8 % — ABNORMAL LOW (ref 39.0–52.0)
Hemoglobin: 10.7 g/dL — ABNORMAL LOW (ref 13.0–17.0)
MCHC: 33.6 g/dL (ref 30.0–36.0)
RBC: 3.29 MIL/uL — ABNORMAL LOW (ref 4.22–5.81)
WBC: 3.9 10*3/uL — ABNORMAL LOW (ref 4.0–10.5)

## 2012-06-23 LAB — BASIC METABOLIC PANEL
BUN: 24 mg/dL — ABNORMAL HIGH (ref 6–23)
Chloride: 101 mEq/L (ref 96–112)
GFR calc Af Amer: 61 mL/min — ABNORMAL LOW (ref >90)
GFR calc non Af Amer: 53 mL/min — ABNORMAL LOW (ref >90)
Potassium: 3.8 mEq/L (ref 3.5–5.1)
Sodium: 136 mEq/L (ref 135–145)

## 2012-06-23 LAB — SEDIMENTATION RATE: Sed Rate: 39 mm/hr — ABNORMAL HIGH (ref 0–16)

## 2012-06-23 MED ORDER — SODIUM CHLORIDE 0.9 % IV SOLN
INTRAVENOUS | Status: DC
  Administered 2012-06-24: 05:00:00 via INTRAVENOUS

## 2012-06-23 NOTE — Progress Notes (Signed)
PATIENT DETAILS Name: [name] Age: 67 y.o. Sex: male Date of Birth: Oct 31, 1945 Admit Date: 06/22/2012 Admitting Physician Sorin Luanne Bras, MD UJW:[id number],GNFAO N, MD  Subjective: Admitted with exertional dyspnea  Assessment/Plan: Active Problems: Shortness of Breath -mostly exertional -Etiology not clear-CTA Chest on 5/7 neg for PE, Echo on 5/15 showed EF 50% with Grade 1 diastolic dysfunction-SOB seems out of proportion for diastolic dysfunction.CXR showed Persistent airspace opacities in the left lower lobe and lingula-has completed course of antibiotics for this-no fever or leukocytosis to indicate active infection.Possibly OSA and resultant restrictive lung disease playing a role. -Hold off on starting antibiotics, check lower extremity doppler and await PCCM eval  Persistent left lower lobe infiltrate -as above  ARF -pre-renal -creatinine much better following IVF-minimize IVF -follow BMET periodically  Lower extremity edema -Patient reported it is secondary to venous insufficiency -await dopplers  Restrictive lung disease/obesity  -Per notes, patient had previous PFTs showed no evidence of obstruction but he had restrictive disease from his obesity  -Reported snoring, though highly recommend outpatient polysomnogram  History of lung cancer  -Patient follows with Dr. Arbutus Ped, status post left lower lobectomy and has finished the chemotherapy.  Chronic Diastolic Heart Failure -resume lasix when renal function better -c/w Coreg  HTN -controlled with Coreg  Dyslipidemia -c/w Statins  Disposition: Remain inpatient  DVT Prophylaxis: Prophylactic Heparin  Code Status: Full code   Family Communication None at bedside  Procedures:  None  CONSULTS:  pulmonary/intensive care   MEDICATIONS: Scheduled Meds: . aspirin EC  81 mg Oral Daily  . atorvastatin  40 mg Oral Daily  . carvedilol  3.125 mg Oral BID WC  . folic acid  1 mg Oral Daily  .  heparin  5,000 Units Subcutaneous Q8H   Continuous Infusions: . sodium chloride Stopped (06/23/12 1005)   PRN Meds:.acetaminophen, acetaminophen  Antibiotics: Anti-infectives   None       PHYSICAL EXAM: Vital signs in last 24 hours: Filed Vitals:   06/22/12 1637 06/22/12 1935 06/22/12 2136 06/23/12 0539  BP: 108/73 92/57 112/61 109/72  Pulse:  73 85 72  Temp:   97.8 F (36.6 C) 98 F (36.7 C)  TempSrc:   Oral Oral  Resp:   21 18  Height:    6' (1.829 m)  Weight:    154.495 kg (340 lb 9.6 oz)  SpO2:   92% 92%    Weight change:  Filed Weights   06/23/12 0539  Weight: 154.495 kg (340 lb 9.6 oz)   Body mass index is 46.18 kg/(m^2).   Gen Exam: Awake and alert with clear speech.   Neck: Supple, No JVD.   Chest: B/L Clear.   CVS: S1 S2 Regular, no murmurs.  Abdomen: soft, BS +, non tender, non distended.  Extremities: 2+ edema, lower extremities warm to touch. Neurologic: Non Focal.   Skin: No Rash.  Wounds: N/A.    Intake/Output from previous day:  Intake/Output Summary (Last 24 hours) at 06/23/12 1230 Last data filed at 06/23/12 0600  Gross per 24 hour  Intake  957.5 ml  Output      0 ml  Net  957.5 ml     LAB RESULTS: CBC  Recent Labs Lab 06/22/12 1250 06/23/12 0546  WBC 4.7 3.9*  HGB 11.7* 10.7*  HCT 33.8* 31.8*  PLT 208 211  MCV 96.6 96.7  MCH 33.4 32.5  MCHC 34.6 33.6  RDW 16.6* 16.3*  LYMPHSABS 1.2  --   MONOABS 0.4  --  EOSABS 0.1  --   BASOSABS 0.0  --     Chemistries   Recent Labs Lab 06/22/12 1250 06/23/12 0546  NA 137 136  K 3.9 3.8  CL 98 101  CO2 27 23  GLUCOSE 123* 111*  BUN 29* 24*  CREATININE 1.72* 1.36*  CALCIUM 9.6 9.0    CBG: No results found for this basename: GLUCAP,  in the last 168 hours  GFR Estimated Creatinine Clearance: 81.9 ml/min (by C-G formula based on Cr of 1.36).  Coagulation profile No results found for this basename: INR, PROTIME,  in the last 168 hours  Cardiac Enzymes  Recent  Labs Lab 06/22/12 1250  TROPONINI <0.30    No components found with this basename: POCBNP,  No results found for this basename: DDIMER,  in the last 72 hours No results found for this basename: HGBA1C,  in the last 72 hours No results found for this basename: CHOL, HDL, LDLCALC, TRIG, CHOLHDL, LDLDIRECT,  in the last 72 hours No results found for this basename: TSH, T4TOTAL, FREET3, T3FREE, THYROIDAB,  in the last 72 hours No results found for this basename: VITAMINB12, FOLATE, FERRITIN, TIBC, IRON, RETICCTPCT,  in the last 72 hours No results found for this basename: LIPASE, AMYLASE,  in the last 72 hours  Urine Studies No results found for this basename: UACOL, UAPR, USPG, UPH, UTP, UGL, UKET, UBIL, UHGB, UNIT, UROB, ULEU, UEPI, UWBC, URBC, UBAC, CAST, CRYS, UCOM, BILUA,  in the last 72 hours  MICROBIOLOGY: No results found for this or any previous visit (from the past 240 hour(s)).  RADIOLOGY STUDIES/RESULTS: Dg Chest 2 View  06/12/2012   *RADIOLOGY REPORT*  Clinical Data:  Shortness of breath  CHEST - 2 VIEW  Comparison: Two-view chest x-ray 06/05/2012.  CT chest 06/05/2012.  Findings: The heart is enlarged.  Chronic fibrotic changes are again noted in the lungs.  Superimposed left lower lobe airspace disease has progressed.  Small bilateral pleural effusions are suspected.  IMPRESSION:  1.  Stable cardiomegaly and mild pulmonary vascular congestion. 2.  Progressive left lower lobe airspace disease is concerning for pneumonia.   Original Report Authenticated By: Marin Roberts, M.D.   Dg Chest 2 View  06/05/2012   *RADIOLOGY REPORT*  Clinical Data: Shortness of breath. History of left lower lobectomy for lung carcinoma.  CHEST - 2 VIEW  Comparison: 02/27/2012  Findings: Lungs show evidence of pulmonary edema with associated cardiomegaly.  Findings are suggestive of congestive heart failure. No significant pleural effusions are present.  Chronic volume loss at the left lung base is  present related to left lower  lobectomy.  IMPRESSION: Findings consistent with congestive heart failure.  Chronic volume loss at the left lung base post operatively.   Original Report Authenticated By: Irish Lack, M.D.   Ct Chest W Contrast  05/24/2012   *RADIOLOGY REPORT*  Clinical Data: Restaging non-small cell lung cancer  CT CHEST WITH CONTRAST  Technique:  Multidetector CT imaging of the chest was performed following the standard protocol during bolus administration of intravenous contrast.  Contrast: OMNIPAQUE IOHEXOL 300 MG/ML  SOLN  Comparison: PET CT from 12/06/2011  Findings: Lungs/pleura: Status post left lower lobectomy.  Fibrotic changes are noted within the lower left lung.  There are moderate changes of centrilobular and paraseptal emphysema identified.  No suspicious pulmonary nodule or mass identified.  Heart/Mediastinum: Heart size is normal.  There is a left paratracheal lymph node which measures 1 cm, image 21/series 3. Previously this measured  8 mm.  Left hilar lymph node measures 1.2 cm, image 28/series 3.  No supraclavicular or axillary adenopathy.  Upper abdomen: Limited imaging through the upper abdomen shows no acute findings.  The adrenal glands both appear within normal limits.  Bones/Musculoskeletal:  Review of the visualized osseous structures is significant for mild multilevel spondylosis in the thoracic spine.  No worrisome lytic or sclerotic bone lesions.  IMPRESSION:  1.  No acute findings. 2.  Status post left lower lobectomy.  No evidence for residual or recurrent local tumor. 3.  Borderline left hilar and left paratracheal lymph nodes. Attention on follow-up imaging is advised. 3. Emphysema.   Original Report Authenticated By: Signa Kell, M.D.   Ct Angio Chest W/cm &/or Wo Cm  06/05/2012   *RADIOLOGY REPORT*  Clinical Data: Shortness of breath.  History of lung cancer with previous left lower lobectomy.  CT ANGIOGRAPHY CHEST  Technique:  Multidetector CT  imaging of the chest using the standard protocol during bolus administration of intravenous contrast. Multiplanar reconstructed images including MIPs were obtained and reviewed to evaluate the vascular anatomy.  Contrast: 80mL OMNIPAQUE IOHEXOL 350 MG/ML SOLN  Comparison: 05/24/2012  Findings: No filling defect is identified within the opacified pulmonary arteries to suggest the presence of an acute pulmonary embolus.  No thoracic aortic aneurysm.  No dissection of the thoracic aorta.  There is no axillary lymphadenopathy.  Scattered mediastinal lymph nodes are increased in number, but do not meet CT criteria for pathologic enlargement.  Heart size is enlarged.  No pericardial or pleural effusion.  Lung windows reveal advanced emphysema.  There is new airspace disease in the posterior left lung (image 49 series 5).  The bronchiectasis and chronic interstitial changes in the left lung base are stable.  Similar but less pronounced chronic interstitial disease is seen in the posterior right base.  Bone windows reveal no worrisome lytic or sclerotic osseous lesions.  IMPRESSION: No CT evidence for acute pulmonary embolus.  New focal airspace disease in the posterior left lung with persistent chronic interstitial/fibrotic changes in the posterior left lower lung.  This may reflect interval development of posterior left lung pneumonia.   Original Report Authenticated By: Kennith Center, M.D.   Dg Chest Port 1 View  06/22/2012   *RADIOLOGY REPORT*  Clinical Data: Shortness of breath.  Bilateral lower extremity edema.  Current history of hypertension.  PORTABLE CHEST - 1 VIEW 06/22/2012 1258 hours:  Comparison: Two-view chest x-ray 06/12/2012.  Findings: Persistent airspace opacities in the left lower lobe and lingula.  Persistent patchy airspace opacities at the right lung base.  No interval change.  Cardiac silhouette mildly enlarged but stable, allowing for differences in technique.  Pulmonary vascularity normal.   IMPRESSION: Pneumonia involving the lung bases, left greater than right, not significantly change since the examination 10 days ago.  Stable cardiomegaly without pulmonary edema.   Original Report Authenticated By: Hulan Saas, M.D.    Jeoffrey Massed, MD  [facility] Pager:[phone]  If 7PM-7AM, please contact night-coverage [email or web address] Password [email or web address] 06/23/2012, 12:30 PM   LOS: 1 day

## 2012-06-23 NOTE — Progress Notes (Signed)
VASCULAR LAB PRELIMINARY  PRELIMINARY  PRELIMINARY  PRELIMINARY  Bilateral lower extremity venous Dopplers completed.    Preliminary report:  There is no DVT or SVT noted in the bilateral lower extremities.  Cornisha Zetino, RVT 06/23/2012, 4:04 PM

## 2012-06-23 NOTE — Consult Note (Signed)
Dictation #:  [id number]

## 2012-06-23 NOTE — Consult Note (Signed)
[name], [name] NO.:  [id number]  MEDICAL RECORD NO.:  [id number]  LOCATION:  5504                         FACILITY:  [facility]  PHYSICIAN:  Barbaraann Share, MD,FCCPDATE OF BIRTH:  10[birth year]16/45  DATE OF CONSULTATION:  06/23/2012 DATE OF DISCHARGE:                                CONSULTATION   HISTORY OF PRESENT ILLNESS:  The patient is a 67 year old male, who I have been asked to see for dyspnea.  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  The patient has also had pulmonary function studies, which showed mild restriction related to his obesity, but no evidence for airflow obstruction despite emphysematous changes on his CT chest.  I saw the patient back after his surgery and chemo and he was doing quite well, and he denied having any issues with his cell counts.  Approximately 4 weeks ago, he began to have progressive shortness of breath, but denied cough or significant mucus or chest congestion.  He also noted worsening lower extremity edema.  He was seen in the emergency room at the beginning of this month, where a CT scan showed no pulmonary embolus, but did show some increased infiltrate in his remaining left upper lobe.  He was treated with Levaquin for 7 days, which did not make a difference in his symptoms.  He was subsequently admitted on Jun 12, 2012, with worsening left upper lobe opacity.  He treated with IV azithromycin and Rocephin, and was also diuresed aggressively.  He was discharged on May 16 on Lasix twice a day, but returned yesterday complaining of worsening dyspnea.  He was found to have renal insufficiency, and also persistent left upper lobe air space disease with hypoxemia with exertion.  The patient still denies any significant cough, he has minimal mucus that  is clear and only thumbnail in size when he is able to produce mucus, and denies chest congestion.  He has not had any pleuritic chest pain, no anginal like chest pain, but has had worsening lower extremity edema. He did have an echocardiogram on the 15th of this month which showed a normal ejection fraction, but did have evidence for diastolic dysfunction.  The left atrium was mildly dilated.  There was no significant valvular disease.  The right ventricle was fairly normal, however, his pulmonary artery pressures were not estimated.  It should be noted the patient is very comfortable on room air at rest and is in no respiratory distress.  PAST MEDICAL HISTORY: 1. Significant for hypertension. 2. Dyslipidemia. 3. History of chronic venous insufficiency. 4. History of adenocarcinoma of the left lower lobe with lobectomy and     chemotherapy. 5. Emphysematous changes on CT chest with no evidence for airflow     obstruction on pulmonary function studies.  ALLERGIES:  The patient has no known drug allergies.  SOCIAL HISTORY:  He has a long history of smoking but quit about 24 years ago.  He drinks alcohol socially and does not use illicit drugs.  FAMILY HISTORY:  Unremarkable.  A 10-point review of systems was unremarkable except for shortness of breath.  PHYSICAL EXAMINATION:  GENERAL:  He is morbidly obese male, in no acute distress at rest with adequate saturation at 92%. VITAL SIGNS:  Blood pressure is 109/72, pulse is 72, respirations 18. He is afebrile. HEENT:  Pupils equal, round, equal, round, and reactive to light and accommodation.  Extraocular muscles are intact.  Nares patent without discharge.  Oropharynx is clear. NECK:  Supple without JVD or lymphadenopathies.  No palpable thyromegaly. CHEST:  Reveals few basilar crackles, excellent airflow with no wheezing. CARDIAC:  Regular rate and rhythm. ABDOMEN:  Soft, nontender, nondistended with good bowel  sounds. GENITAL:  Not done and not indicated. RECTAL:  Not done and not indicated. BREASTS:  Not done and not indicated. EXTREMITIES:  Lower extremities with very severe anasarca bilaterally, pulses were unable to be palpated because of the edema but there was no calf tenderness or cyanosis. NEUROLOGIC:  He is alert and oriented.  Moves all 4 extremities.  IMPRESSION:  Dyspnea on exertion with worsening pulmonary infiltrates in the patient's remaining left upper lobe, and some indication for increased densities in the right lower lobe.  He has been treated with multiple rounds of antibiotics, and therefore it is very unlikely this represents an infectious process.  I wonder if this could be an inflammatory process related to his chemotherapy, and therefore would recommend checking a sed rate, and if is very elevated, would give him a trial of steroids.  The other possibility is volume overload from diastolic dysfunction, although the patient did not have a dramatic response to aggressive diuresis.  I would give some consideration to possibly doing a right heart catheterization to see if his left atrial pressures are very elevated.  Finally, my other consideration would be whether he has had a recurrence of his adenocarcinoma in his remaining left upper lobe.  This could be diagnosed with bronchoscopy and transbronchial lung biopsies.  I also think that his morbid obesity, as well as his deconditioning are contributing to his shortness of breath with exertion.  RECOMMENDATIONS: 1. We will check sed rate today and consider a steroid trial if very     elevated. 2. If the sed rate is unremarkable, we would give some consideration     for possible right heart catheterization. 3. We will consider fiberoptic bronchoscopy with biopsy. 4.  would consider lower extremity venous Doppler if this has not     been done recently to rule out DVT.  Conceivably, I wonder if the     shortness of  breath could represent chronic thromboembolic disease     which is easily missed by CT chest.     Barbaraann Share, MD,FCCP     [facility]  D:  06/23/2012  T:  06/23/2012  Job:  [id number]

## 2012-06-23 NOTE — Progress Notes (Signed)
Utilization review completed.  P.J. Keionte Swicegood,RN,BSN Case Manager [phone]  

## 2012-06-24 DIAGNOSIS — R0602 Shortness of breath: Secondary | ICD-10-CM

## 2012-06-24 LAB — BASIC METABOLIC PANEL
CO2: 27 mEq/L (ref 19–32)
Chloride: 104 mEq/L (ref 96–112)
Glucose, Bld: 103 mg/dL — ABNORMAL HIGH (ref 70–99)
Potassium: 4.1 mEq/L (ref 3.5–5.1)
Sodium: 138 mEq/L (ref 135–145)

## 2012-06-24 MED ORDER — ALBUTEROL SULFATE HFA 108 (90 BASE) MCG/ACT IN AERS: 2.0000 | INHALATION_SPRAY | RESPIRATORY_TRACT | Status: DC | PRN

## 2012-06-24 MED ORDER — PREDNISONE 10 MG PO TABS: 40.0000 mg | ORAL_TABLET | Freq: Every day | ORAL | Status: DC

## 2012-06-24 MED ORDER — PREDNISONE 20 MG PO TABS
40.0000 mg | ORAL_TABLET | Freq: Every day | ORAL | Status: DC
  Filled 2012-06-24: qty 2

## 2012-06-24 MED ORDER — FUROSEMIDE 80 MG PO TABS: 40.0000 mg | ORAL_TABLET | Freq: Two times a day (BID) | ORAL | Status: DC

## 2012-06-24 NOTE — Progress Notes (Signed)
Brief visit with pt as per charge nurse's recommendation. I introduced myself and my role to the pt. Pt appeared to be sleeping on his chair when I entered his room. Pt stated that everything was fine and that he was doing okay. Pt's responses were brief and I let the pt know that he can request for me if he needs anything.   Sherol Dade Counselor Intern Haroldine Laws

## 2012-06-24 NOTE — Progress Notes (Signed)
PATIENT DETAILS Name: [name] Age: 67 y.o. Sex: male Date of Birth: 26-Aug-1945 Admit Date: 06/22/2012 Admitting Physician Sorin Luanne Bras, MD WUJ:[id number],WGNFA N, MD  Subjective: Admitted with exertional dyspnea  Assessment/Plan: Active Problems: Shortness of Breath -mostly exertional -Etiology not clear-CTA Chest on 5/7 neg for PE, Echo on 5/15 showed EF 50% with Grade 1 diastolic dysfunction-SOB seems out of proportion for diastolic dysfunction.CXR showed Persistent airspace opacities in the left lower lobe and lingula-has completed course of antibiotics for this-no fever or leukocytosis to indicate active infection.Possibly OSA and resultant restrictive lung disease playing a role. -lower extremity doppler-negative for DVT -Still Holding off on starting antibiotics -appreciate PCCM eval-ESR in the 30's-will defer to PCCM whether or not to start on Prednisone or to pursue further work up  Persistent left lower lobe infiltrate -as above  ARF -pre-renal-resolved with IVF -follow BMET periodically  Lower extremity edema -Patient reported it is secondary to venous insufficiency - dopplers-negative-may need a right heart cath  Restrictive lung disease/obesity  -Per notes, patient had previous PFTs showed no evidence of obstruction but he had restrictive disease from his obesity  -Reported snoring, though highly recommend outpatient polysomnogram  History of lung cancer  -Patient follows with Dr. Arbutus Ped, status post left lower lobectomy and has finished the chemotherapy.  Chronic Diastolic Heart Failure -resume lasix when renal function better -c/w Coreg  HTN -controlled with Coreg  Dyslipidemia -c/w Statins  Disposition: Remain inpatient  DVT Prophylaxis: Prophylactic Heparin  Code Status: Full code   Family Communication None at bedside  Procedures:  None  CONSULTS:  pulmonary/intensive care   MEDICATIONS: Scheduled Meds: . aspirin EC  81 mg  Oral Daily  . atorvastatin  40 mg Oral Daily  . carvedilol  3.125 mg Oral BID WC  . folic acid  1 mg Oral Daily  . heparin  5,000 Units Subcutaneous Q8H   Continuous Infusions: . sodium chloride 40 mL/hr at 06/24/12 0521   PRN Meds:.acetaminophen, acetaminophen  Antibiotics: Anti-infectives   None       PHYSICAL EXAM: Vital signs in last 24 hours: Filed Vitals:   06/23/12 1539 06/23/12 2134 06/24/12 0459 06/24/12 0756  BP: 110/68 127/72 110/68 100/68  Pulse: 80 67 82 84  Temp: 98 F (36.7 C) 97.3 F (36.3 C) 98.4 F (36.9 C)   TempSrc: Oral Oral Oral   Resp: 20 20 17    Height:      Weight:   155.1 kg (341 lb 14.9 oz)   SpO2: 91% 94% 93%     Weight change: 0.605 kg (1 lb 5.3 oz) Filed Weights   06/23/12 0539 06/24/12 0459  Weight: 154.495 kg (340 lb 9.6 oz) 155.1 kg (341 lb 14.9 oz)   Body mass index is 46.36 kg/(m^2).   Gen Exam: Awake and alert with clear speech.   Neck: Supple, No JVD.   Chest: B/L Clear.   CVS: S1 S2 Regular, no murmurs.  Abdomen: soft, BS +, non tender, non distended.  Extremities: 2+ edema, lower extremities warm to touch. Neurologic: Non Focal.   Skin: No Rash.  Wounds: N/A.    Intake/Output from previous day:  Intake/Output Summary (Last 24 hours) at 06/24/12 0923 Last data filed at 06/24/12 0600  Gross per 24 hour  Intake    854 ml  Output      0 ml  Net    854 ml     LAB RESULTS: CBC  Recent Labs Lab 06/22/12 1250 06/23/12 0546  WBC  4.7 3.9*  HGB 11.7* 10.7*  HCT 33.8* 31.8*  PLT 208 211  MCV 96.6 96.7  MCH 33.4 32.5  MCHC 34.6 33.6  RDW 16.6* 16.3*  LYMPHSABS 1.2  --   MONOABS 0.4  --   EOSABS 0.1  --   BASOSABS 0.0  --     Chemistries   Recent Labs Lab 06/22/12 1250 06/23/12 0546 06/24/12 0540  NA 137 136 138  K 3.9 3.8 4.1  CL 98 101 104  CO2 27 23 27   GLUCOSE 123* 111* 103*  BUN 29* 24* 18  CREATININE 1.72* 1.36* 1.23  CALCIUM 9.6 9.0 9.1    CBG: No results found for this basename:  GLUCAP,  in the last 168 hours  GFR Estimated Creatinine Clearance: 90.7 ml/min (by C-G formula based on Cr of 1.23).  Coagulation profile No results found for this basename: INR, PROTIME,  in the last 168 hours  Cardiac Enzymes  Recent Labs Lab 06/22/12 1250  TROPONINI <0.30    No components found with this basename: POCBNP,  No results found for this basename: DDIMER,  in the last 72 hours No results found for this basename: HGBA1C,  in the last 72 hours No results found for this basename: CHOL, HDL, LDLCALC, TRIG, CHOLHDL, LDLDIRECT,  in the last 72 hours No results found for this basename: TSH, T4TOTAL, FREET3, T3FREE, THYROIDAB,  in the last 72 hours No results found for this basename: VITAMINB12, FOLATE, FERRITIN, TIBC, IRON, RETICCTPCT,  in the last 72 hours No results found for this basename: LIPASE, AMYLASE,  in the last 72 hours  Urine Studies No results found for this basename: UACOL, UAPR, USPG, UPH, UTP, UGL, UKET, UBIL, UHGB, UNIT, UROB, ULEU, UEPI, UWBC, URBC, UBAC, CAST, CRYS, UCOM, BILUA,  in the last 72 hours  MICROBIOLOGY: No results found for this or any previous visit (from the past 240 hour(s)).  RADIOLOGY STUDIES/RESULTS: Dg Chest 2 View  06/12/2012   *RADIOLOGY REPORT*  Clinical Data:  Shortness of breath  CHEST - 2 VIEW  Comparison: Two-view chest x-ray 06/05/2012.  CT chest 06/05/2012.  Findings: The heart is enlarged.  Chronic fibrotic changes are again noted in the lungs.  Superimposed left lower lobe airspace disease has progressed.  Small bilateral pleural effusions are suspected.  IMPRESSION:  1.  Stable cardiomegaly and mild pulmonary vascular congestion. 2.  Progressive left lower lobe airspace disease is concerning for pneumonia.   Original Report Authenticated By: Marin Roberts, M.D.   Dg Chest 2 View  06/05/2012   *RADIOLOGY REPORT*  Clinical Data: Shortness of breath. History of left lower lobectomy for lung carcinoma.  CHEST - 2 VIEW   Comparison: 02/27/2012  Findings: Lungs show evidence of pulmonary edema with associated cardiomegaly.  Findings are suggestive of congestive heart failure. No significant pleural effusions are present.  Chronic volume loss at the left lung base is present related to left lower  lobectomy.  IMPRESSION: Findings consistent with congestive heart failure.  Chronic volume loss at the left lung base post operatively.   Original Report Authenticated By: Irish Lack, M.D.   Ct Chest W Contrast  05/24/2012   *RADIOLOGY REPORT*  Clinical Data: Restaging non-small cell lung cancer  CT CHEST WITH CONTRAST  Technique:  Multidetector CT imaging of the chest was performed following the standard protocol during bolus administration of intravenous contrast.  Contrast: OMNIPAQUE IOHEXOL 300 MG/ML  SOLN  Comparison: PET CT from 12/06/2011  Findings: Lungs/pleura: Status post left lower lobectomy.  Fibrotic changes are noted within the lower left lung.  There are moderate changes of centrilobular and paraseptal emphysema identified.  No suspicious pulmonary nodule or mass identified.  Heart/Mediastinum: Heart size is normal.  There is a left paratracheal lymph node which measures 1 cm, image 21/series 3. Previously this measured 8 mm.  Left hilar lymph node measures 1.2 cm, image 28/series 3.  No supraclavicular or axillary adenopathy.  Upper abdomen: Limited imaging through the upper abdomen shows no acute findings.  The adrenal glands both appear within normal limits.  Bones/Musculoskeletal:  Review of the visualized osseous structures is significant for mild multilevel spondylosis in the thoracic spine.  No worrisome lytic or sclerotic bone lesions.  IMPRESSION:  1.  No acute findings. 2.  Status post left lower lobectomy.  No evidence for residual or recurrent local tumor. 3.  Borderline left hilar and left paratracheal lymph nodes. Attention on follow-up imaging is advised. 3. Emphysema.   Original Report  Authenticated By: Signa Kell, M.D.   Ct Angio Chest W/cm &/or Wo Cm  06/05/2012   *RADIOLOGY REPORT*  Clinical Data: Shortness of breath.  History of lung cancer with previous left lower lobectomy.  CT ANGIOGRAPHY CHEST  Technique:  Multidetector CT imaging of the chest using the standard protocol during bolus administration of intravenous contrast. Multiplanar reconstructed images including MIPs were obtained and reviewed to evaluate the vascular anatomy.  Contrast: 80mL OMNIPAQUE IOHEXOL 350 MG/ML SOLN  Comparison: 05/24/2012  Findings: No filling defect is identified within the opacified pulmonary arteries to suggest the presence of an acute pulmonary embolus.  No thoracic aortic aneurysm.  No dissection of the thoracic aorta.  There is no axillary lymphadenopathy.  Scattered mediastinal lymph nodes are increased in number, but do not meet CT criteria for pathologic enlargement.  Heart size is enlarged.  No pericardial or pleural effusion.  Lung windows reveal advanced emphysema.  There is new airspace disease in the posterior left lung (image 49 series 5).  The bronchiectasis and chronic interstitial changes in the left lung base are stable.  Similar but less pronounced chronic interstitial disease is seen in the posterior right base.  Bone windows reveal no worrisome lytic or sclerotic osseous lesions.  IMPRESSION: No CT evidence for acute pulmonary embolus.  New focal airspace disease in the posterior left lung with persistent chronic interstitial/fibrotic changes in the posterior left lower lung.  This may reflect interval development of posterior left lung pneumonia.   Original Report Authenticated By: Kennith Center, M.D.   Dg Chest Port 1 View  06/22/2012   *RADIOLOGY REPORT*  Clinical Data: Shortness of breath.  Bilateral lower extremity edema.  Current history of hypertension.  PORTABLE CHEST - 1 VIEW 06/22/2012 1258 hours:  Comparison: Two-view chest x-ray 06/12/2012.  Findings: Persistent  airspace opacities in the left lower lobe and lingula.  Persistent patchy airspace opacities at the right lung base.  No interval change.  Cardiac silhouette mildly enlarged but stable, allowing for differences in technique.  Pulmonary vascularity normal.  IMPRESSION: Pneumonia involving the lung bases, left greater than right, not significantly change since the examination 10 days ago.  Stable cardiomegaly without pulmonary edema.   Original Report Authenticated By: Hulan Saas, M.D.    Jeoffrey Massed, MD  [facility] Pager:[phone]  If 7PM-7AM, please contact night-coverage [email or web address] Password [facility] 06/24/2012, 9:23 AM   LOS: 2 days

## 2012-06-24 NOTE — Progress Notes (Signed)
NURSING PROGRESS NOTE  [name] [id number] Discharge Data: 06/24/2012 3:33 PM Attending Provider: Maretta Bees, MD NWG:[id number] N, MD     [name] to be D/C'd Home per MD order.  Discussed with the patient the After Visit Summary and all questions fully answered. All IV's discontinued with no bleeding noted. All belongings returned to patient for patient to take home.   Last Vital Signs:  Blood pressure 107/67, pulse 74, temperature 98.3 F (36.8 C), temperature source Oral, resp. rate 18, height 6' (1.829 m), weight 155.1 kg (341 lb 14.9 oz), SpO2 92.00%.  Discharge Medication List   Medication List    STOP taking these medications       ramipril 5 MG capsule  Commonly known as:  ALTACE      TAKE these medications       albuterol 108 (90 BASE) MCG/ACT inhaler  Commonly known as:  PROVENTIL HFA;VENTOLIN HFA  Inhale 2 puffs into the lungs every 4 (four) hours as needed for wheezing.     aspirin EC 81 MG tablet  Take 81 mg by mouth daily.     atorvastatin 40 MG tablet  Commonly known as:  LIPITOR  Take 1 tablet by mouth daily.     carvedilol 3.125 MG tablet  Commonly known as:  COREG  Take 3.125 mg by mouth 2 (two) times daily with a meal.     CRESTOR 10 MG tablet  Generic drug:  rosuvastatin  Take 10 mg by mouth daily.     folic acid 1 MG tablet  Commonly known as:  FOLVITE  Take 1 tablet (1 mg total) by mouth daily.     furosemide 80 MG tablet  Commonly known as:  LASIX  Take 0.5 tablets (40 mg total) by mouth 2 (two) times daily.     potassium chloride SA 20 MEQ tablet  Commonly known as:  K-DUR,KLOR-CON  Take 20 mEq by mouth 2 (two) times daily.     predniSONE 10 MG tablet  Commonly known as:  DELTASONE  Take 4 tablets (40 mg total) by mouth daily with breakfast. Take 4 tablets daily for 2 days, then,  Take 3 tablets daily for 2 days, then,  Take 2 tablets daily for 2 days, then,  Take 1 tablet daily for 1 day and then stop   Start taking on:  06/25/2012
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
## 2012-06-24 NOTE — Progress Notes (Signed)
Subjective: Stable overnight, comfortable in chair on room air  Objective: Vital signs in last 24 hours: Blood pressure 100/68, pulse 84, temperature 98.4 F (36.9 C), temperature source Oral, resp. rate 17, height 6' (1.829 m), weight 155.1 kg (341 lb 14.9 oz), SpO2 93.00%.  Intake/Output from previous day: 05/25 0701 - 05/26 0700 In: 854 [P.O.:240; I.V.:614] Out: -    Physical Exam:   obese male in nad Nose without purulence or d/c noted. Neck without LN or TMG Chest with a few basilar crackles, no wheezing Cor with rrr LE with severe edema, no cyanosis Alert and oriented, moves all 4.    Lab Results:  Recent Labs  06/22/12 1250 06/23/12 0546  WBC 4.7 3.9*  HGB 11.7* 10.7*  HCT 33.8* 31.8*  PLT 208 211   BMET  Recent Labs  06/22/12 1250 06/23/12 0546 06/24/12 0540  NA 137 136 138  K 3.9 3.8 4.1  CL 98 101 104  CO2 27 23 27   GLUCOSE 123* 111* 103*  BUN 29* 24* 18  CREATININE 1.72* 1.36* 1.23  CALCIUM 9.6 9.0 9.1    Studies/Results: Dg Chest Port 1 View  06/22/2012   *RADIOLOGY REPORT*  Clinical Data: Shortness of breath.  Bilateral lower extremity edema.  Current history of hypertension.  PORTABLE CHEST - 1 VIEW 06/22/2012 1258 hours:  Comparison: Two-view chest x-ray 06/12/2012.  Findings: Persistent airspace opacities in the left lower lobe and lingula.  Persistent patchy airspace opacities at the right lung base.  No interval change.  Cardiac silhouette mildly enlarged but stable, allowing for differences in technique.  Pulmonary vascularity normal.  IMPRESSION: Pneumonia involving the lung bases, left greater than right, not significantly change since the examination 10 days ago.  Stable cardiomegaly without pulmonary edema.   Original Report Authenticated By: Hulan Saas, M.D.    Assessment/Plan:  Worsening infiltrate in LUL by cxr of unknown origin.  Has not responded to abx or diuresis The pt is very comfortable currently on room air at rest  with adequate sats.  He gets dyspneic with exertion.  Some of this is certainly related to his morbid obesity with deconditioning, probable element of diastolic dysfunction, the fact he is s/p lobectomy, and now with increased density LUL and somewhat RLL.  His LE dopplers were neg for dvt, and his sed rate is not overly impressive.  Because he has received chemo (but never had neutropenia) will treat emperically with a course of steroids to see if he improves.  If not, will need FOB with TBBX LUL.    -would treat with prednisone 40mg  to off over 8 days. -he is completely stable at this time, and therefore would consider d/c home.  He can followup with me in office next week with cxr.    Barbaraann Share, M.D. 06/24/2012, 12:24 PM

## 2012-06-28 ENCOUNTER — Encounter: Payer: Self-pay | Admitting: Adult Health

## 2012-06-28 ENCOUNTER — Ambulatory Visit (INDEPENDENT_AMBULATORY_CARE_PROVIDER_SITE_OTHER): Payer: Medicare Other | Admitting: Adult Health

## 2012-06-28 ENCOUNTER — Encounter (HOSPITAL_COMMUNITY): Payer: Self-pay | Admitting: General Practice

## 2012-06-28 ENCOUNTER — Ambulatory Visit (INDEPENDENT_AMBULATORY_CARE_PROVIDER_SITE_OTHER)
Admission: RE | Admit: 2012-06-28 | Discharge: 2012-06-28 | Disposition: A | Discharge status: Home / Self Care | Payer: Medicare Other | Source: Ambulatory Visit | Attending: Adult Health | Admitting: Adult Health

## 2012-06-28 ENCOUNTER — Inpatient Hospital Stay (HOSPITAL_COMMUNITY)
Admission: AD | Admit: 2012-06-28 | Discharge: 2012-07-05 | DRG: 286 | Disposition: A | Discharge status: Home / Self Care | Payer: Medicare Other | Source: Ambulatory Visit | Attending: Pulmonary Disease | Admitting: Pulmonary Disease

## 2012-06-28 VITALS — BP 134/86 | HR 80 | Temp 97.5°F | Ht 70.0 in | Wt 354.6 lb

## 2012-06-28 DIAGNOSIS — J189 Pneumonia, unspecified organism: Secondary | ICD-10-CM

## 2012-06-28 DIAGNOSIS — R7302 Impaired glucose tolerance (oral): Secondary | ICD-10-CM

## 2012-06-28 DIAGNOSIS — I2789 Other specified pulmonary heart diseases: Secondary | ICD-10-CM | POA: Diagnosis present

## 2012-06-28 DIAGNOSIS — R918 Other nonspecific abnormal finding of lung field: Secondary | ICD-10-CM | POA: Diagnosis present

## 2012-06-28 DIAGNOSIS — E739 Lactose intolerance, unspecified: Secondary | ICD-10-CM | POA: Diagnosis present

## 2012-06-28 DIAGNOSIS — N179 Acute kidney failure, unspecified: Secondary | ICD-10-CM

## 2012-06-28 DIAGNOSIS — Z87891 Personal history of nicotine dependence: Secondary | ICD-10-CM

## 2012-06-28 DIAGNOSIS — C3490 Malignant neoplasm of unspecified part of unspecified bronchus or lung: Secondary | ICD-10-CM

## 2012-06-28 DIAGNOSIS — Z6841 Body Mass Index (BMI) 40.0 and over, adult: Secondary | ICD-10-CM

## 2012-06-28 DIAGNOSIS — I5031 Acute diastolic (congestive) heart failure: Secondary | ICD-10-CM

## 2012-06-28 DIAGNOSIS — I130 Hypertensive heart and chronic kidney disease with heart failure and stage 1 through stage 4 chronic kidney disease, or unspecified chronic kidney disease: Secondary | ICD-10-CM | POA: Diagnosis present

## 2012-06-28 DIAGNOSIS — E669 Obesity, unspecified: Secondary | ICD-10-CM

## 2012-06-28 DIAGNOSIS — J9600 Acute respiratory failure, unspecified whether with hypoxia or hypercapnia: Secondary | ICD-10-CM

## 2012-06-28 DIAGNOSIS — Z85118 Personal history of other malignant neoplasm of bronchus and lung: Secondary | ICD-10-CM

## 2012-06-28 DIAGNOSIS — J984 Other disorders of lung: Secondary | ICD-10-CM

## 2012-06-28 DIAGNOSIS — I509 Heart failure, unspecified: Secondary | ICD-10-CM

## 2012-06-28 DIAGNOSIS — I10 Essential (primary) hypertension: Secondary | ICD-10-CM

## 2012-06-28 DIAGNOSIS — J9620 Acute and chronic respiratory failure, unspecified whether with hypoxia or hypercapnia: Secondary | ICD-10-CM | POA: Diagnosis present

## 2012-06-28 DIAGNOSIS — I872 Venous insufficiency (chronic) (peripheral): Secondary | ICD-10-CM | POA: Diagnosis present

## 2012-06-28 DIAGNOSIS — E7800 Pure hypercholesterolemia, unspecified: Secondary | ICD-10-CM | POA: Diagnosis present

## 2012-06-28 DIAGNOSIS — R0902 Hypoxemia: Secondary | ICD-10-CM | POA: Diagnosis present

## 2012-06-28 DIAGNOSIS — J438 Other emphysema: Secondary | ICD-10-CM | POA: Diagnosis present

## 2012-06-28 DIAGNOSIS — E785 Hyperlipidemia, unspecified: Secondary | ICD-10-CM | POA: Diagnosis present

## 2012-06-28 DIAGNOSIS — D649 Anemia, unspecified: Secondary | ICD-10-CM | POA: Diagnosis present

## 2012-06-28 DIAGNOSIS — J9601 Acute respiratory failure with hypoxia: Secondary | ICD-10-CM | POA: Insufficient documentation

## 2012-06-28 DIAGNOSIS — E860 Dehydration: Secondary | ICD-10-CM

## 2012-06-28 DIAGNOSIS — N182 Chronic kidney disease, stage 2 (mild): Secondary | ICD-10-CM | POA: Diagnosis present

## 2012-06-28 DIAGNOSIS — I5033 Acute on chronic diastolic (congestive) heart failure: Principal | ICD-10-CM | POA: Diagnosis present

## 2012-06-28 DIAGNOSIS — G4733 Obstructive sleep apnea (adult) (pediatric): Secondary | ICD-10-CM | POA: Diagnosis present

## 2012-06-28 DIAGNOSIS — M129 Arthropathy, unspecified: Secondary | ICD-10-CM | POA: Diagnosis present

## 2012-06-28 LAB — CBC WITH DIFFERENTIAL/PLATELET
Basophils Absolute: 0 10*3/uL (ref 0.0–0.1)
Basophils Relative: 0 % (ref 0–1)
Eosinophils Absolute: 0 10*3/uL (ref 0.0–0.7)
Hemoglobin: 11.4 g/dL — ABNORMAL LOW (ref 13.0–17.0)
MCH: 32.5 pg (ref 26.0–34.0)
MCHC: 33.8 g/dL (ref 30.0–36.0)
Monocytes Relative: 4 % (ref 3–12)
Neutro Abs: 4.8 10*3/uL (ref 1.7–7.7)
Neutrophils Relative %: 85 % — ABNORMAL HIGH (ref 43–77)
Platelets: 213 10*3/uL (ref 150–400)
RDW: 16.3 % — ABNORMAL HIGH (ref 11.5–15.5)

## 2012-06-28 LAB — COMPREHENSIVE METABOLIC PANEL
ALT: 12 U/L (ref 0–53)
AST: 19 U/L (ref 0–37)
CO2: 24 mEq/L (ref 19–32)
Calcium: 9.1 mg/dL (ref 8.4–10.5)
Chloride: 103 mEq/L (ref 96–112)
GFR calc Af Amer: >90 mL/min (ref >90)
GFR calc non Af Amer: 84 mL/min — ABNORMAL LOW (ref >90)
Glucose, Bld: 143 mg/dL — ABNORMAL HIGH (ref 70–99)
Sodium: 137 mEq/L (ref 135–145)
Total Bilirubin: 0.3 mg/dL (ref 0.3–1.2)

## 2012-06-28 LAB — URINALYSIS, ROUTINE W REFLEX MICROSCOPIC
Glucose, UA: NEGATIVE mg/dL
Hgb urine dipstick: NEGATIVE
Ketones, ur: NEGATIVE mg/dL
Leukocytes, UA: NEGATIVE
Protein, ur: NEGATIVE mg/dL
pH: 6 (ref 5.0–8.0)

## 2012-06-28 LAB — MRSA PCR SCREENING: MRSA by PCR: NEGATIVE

## 2012-06-28 LAB — TROPONIN I: Troponin I: <0.3 ng/mL (ref <0.30)

## 2012-06-28 LAB — TSH: TSH: 1.097 u[IU]/mL (ref 0.350–4.500)

## 2012-06-28 LAB — PRO B NATRIURETIC PEPTIDE: Pro B Natriuretic peptide (BNP): 555.7 pg/mL — ABNORMAL HIGH (ref 0–125)

## 2012-06-28 MED ORDER — SPIRONOLACTONE 25 MG PO TABS
25.0000 mg | ORAL_TABLET | Freq: Every day | ORAL | Status: DC
  Administered 2012-06-28 – 2012-07-05 (×7): 25 mg via ORAL
  Filled 2012-06-28 (×8): qty 1

## 2012-06-28 MED ORDER — ALBUTEROL SULFATE (5 MG/ML) 0.5% IN NEBU: 2.5000 mg | INHALATION_SOLUTION | Freq: Four times a day (QID) | RESPIRATORY_TRACT | Status: DC | PRN

## 2012-06-28 MED ORDER — FUROSEMIDE 10 MG/ML IJ SOLN
80.0000 mg | Freq: Every day | INTRAMUSCULAR | Status: DC
  Administered 2012-06-28 – 2012-07-03 (×5): 80 mg via INTRAVENOUS
  Filled 2012-06-28 (×7): qty 8

## 2012-06-28 MED ORDER — SODIUM CHLORIDE 0.9 % IJ SOLN: 3.0000 mL | INTRAMUSCULAR | Status: DC | PRN

## 2012-06-28 MED ORDER — CARVEDILOL 3.125 MG PO TABS
3.1250 mg | ORAL_TABLET | Freq: Two times a day (BID) | ORAL | Status: DC
  Administered 2012-06-28 – 2012-07-05 (×14): 3.125 mg via ORAL
  Filled 2012-06-28 (×17): qty 1

## 2012-06-28 MED ORDER — HEPARIN SODIUM (PORCINE) 5000 UNIT/ML IJ SOLN
5000.0000 [IU] | Freq: Three times a day (TID) | INTRAMUSCULAR | Status: DC
  Administered 2012-06-28 – 2012-07-01 (×11): 5000 [IU] via SUBCUTANEOUS
  Filled 2012-06-28 (×16): qty 1

## 2012-06-28 MED ORDER — ASPIRIN EC 81 MG PO TBEC
81.0000 mg | DELAYED_RELEASE_TABLET | Freq: Every day | ORAL | Status: DC
  Administered 2012-06-28 – 2012-07-01 (×4): 81 mg via ORAL
  Filled 2012-06-28 (×4): qty 1

## 2012-06-28 MED ORDER — SODIUM CHLORIDE 0.9 % IJ SOLN
3.0000 mL | Freq: Two times a day (BID) | INTRAMUSCULAR | Status: DC
  Administered 2012-06-28 – 2012-07-04 (×10): 3 mL via INTRAVENOUS

## 2012-06-28 MED ORDER — FOLIC ACID 1 MG PO TABS
1.0000 mg | ORAL_TABLET | Freq: Every day | ORAL | Status: DC
  Administered 2012-06-28 – 2012-07-05 (×7): 1 mg via ORAL
  Filled 2012-06-28 (×8): qty 1

## 2012-06-28 MED ORDER — SODIUM CHLORIDE 0.9 % IV SOLN: 250.0000 mL | INTRAVENOUS | Status: DC | PRN

## 2012-06-28 MED ORDER — ATORVASTATIN CALCIUM 40 MG PO TABS
40.0000 mg | ORAL_TABLET | ORAL | Status: DC
  Administered 2012-06-28 – 2012-07-01 (×4): 40 mg via ORAL
  Filled 2012-06-28 (×4): qty 1

## 2012-06-28 NOTE — Progress Notes (Signed)
Received patient to 2609. Dr. Delford Field made aware.

## 2012-06-28 NOTE — Progress Notes (Signed)
Subjective:    Patient ID: [name], male    DOB: April 15, 1945, 67 y.o.   MRN: [id number]  HPI The patient is a 67 year old male, former smoker w/ dx of  adenocarcinoma of the left lower lobe 10/2011  , and underwent lobectomy and adjuvent chemotherapy with cis-platinum and ALIMTA.  ,   PFT 10/2011 w/ showed restrictive changes w/ no obstruction FEV1 2.27 (72%), ratio 80, TLC 64%, DLCO 44% pred.    06/28/2012 Post Hospital follow up   Initially admitted on Jun 12, 2012, with worsening left upper lobe opacity. He treated with IV azithromycin and Rocephin,  and was also diuresed aggressively. He was discharged on May 16 on Lasix twice a day, Readmitted 5/24 for worsening   dyspnea. He was found to have renal insufficiency, and also persistent  left upper lobe air space disease with hypoxemia with exertion.   He did have an echocardiogram on the 15th of this month which showed a normal ejection fraction, but did have evidence for diastolic dysfunction. The left atrium was mildly dilated. There was no significant valvular disease. The right ventricle was fairly normal, however, his pulmonary artery pressures were not estimated.  Pt returns to office today with persistent dyspnea. Does not feel any better. LE swelling is improved .  On arrival O2 sat 69% on RA. Placed on 2 l/m w/ sats at 93% at rest. CXR showed interval development of mild diffuse interstitial pulmonary edema. Has minimal cough-mostly dry  No orthopnea or fever. No chest pain .   He will require readmission for further treatment.    PAST MEDICAL HISTORY:  1. Significant for hypertension.  2. Dyslipidemia.  3. History of chronic venous insufficiency.  4. History of adenocarcinoma of the left lower lobe with lobectomy and  chemotherapy.  5. Emphysematous changes on CT chest with no evidence for airflow  obstruction on pulmonary function studies.  6. ? OSA -not on CPAP .       Review of Systems Constitutional:   No   weight loss, night sweats,  Fevers, chills, +fatigue, or  lassitude.  HEENT:   No headaches,  Difficulty swallowing,  Tooth/dental problems, or  Sore throat,                No sneezing, itching, ear ache,  +nasal congestion, post nasal drip,   CV:  No chest pain,  Orthopnea, PND, swelling in lower extremities, anasarca, dizziness, palpitations, syncope.   GI  No heartburn, indigestion, abdominal pain, nausea, vomiting, diarrhea, change in bowel habits, loss of appetite, bloody stools.   Resp: N No coughing up of blood.  No change in color of mucus.  No wheezing.  No chest wall deformity  Skin: no rash or lesions.  GU: no dysuria, change in color of urine, no urgency or frequency.  No flank pain, no hematuria   MS:  No joint pain or swelling.  No decreased range of motion.  No back pain.  Psych:  No change in mood or affect. No depression or anxiety.  No memory loss.         Objective:   Physical Exam GEN: A/Ox3; pleasant , NAD, morbidly obese   HEENT:  [place]/AT,  EACs-clear, TMs-wnl, NOSE-clear, THROAT-clear, no lesions, no postnasal drip or exudate noted.   NECK:  Supple w/ fair ROM; no JVD; normal carotid impulses w/o bruits; no thyromegaly or nodules palpated; no lymphadenopathy.  RESP  diminished BS in bases , w/o, wheezes/ rales/ or rhonchi.no accessory  muscle use, no dullness to percussion  CARD:  RRR, 2/6 SM   , 2+ peripheral edema, pulses intact, no cyanosis or clubbing.  GI:   Soft & nt; nml bowel sounds; no organomegaly or masses detected.  Musco: Warm bil, no deformities or joint swelling noted.   Neuro: alert, no focal deficits noted.    Skin: Warm, no lesions or rashes         Assessment & Plan:
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
## 2012-06-28 NOTE — H&P (Signed)
Pt seen and examined today with NP.  His cxr is most c/w edema, and last echo does suggest elevated left atrial pressures and diastolic dysfunction.  His renal insuff has been an issue with trying to aggressively diurese him.  Agree with cards consult.  I would consider a right AND left heart cath to put issue to rest.  If this is not edema, then I would be very concerned this could be a severe recurrence of his adenoca which initially presented similar to BAC.

## 2012-06-28 NOTE — Assessment & Plan Note (Signed)
Admit  

## 2012-06-29 ENCOUNTER — Inpatient Hospital Stay (HOSPITAL_COMMUNITY): Payer: Medicare Other

## 2012-06-29 DIAGNOSIS — J984 Other disorders of lung: Secondary | ICD-10-CM

## 2012-06-29 DIAGNOSIS — J189 Pneumonia, unspecified organism: Secondary | ICD-10-CM

## 2012-06-29 DIAGNOSIS — E669 Obesity, unspecified: Secondary | ICD-10-CM

## 2012-06-29 LAB — CBC
MCV: 97.1 fL (ref 78.0–100.0)
Platelets: 226 10*3/uL (ref 150–400)
RBC: 3.48 MIL/uL — ABNORMAL LOW (ref 4.22–5.81)
RDW: 16.4 % — ABNORMAL HIGH (ref 11.5–15.5)
WBC: 4.7 10*3/uL (ref 4.0–10.5)

## 2012-06-29 LAB — BASIC METABOLIC PANEL
CO2: 28 mEq/L (ref 19–32)
Chloride: 101 mEq/L (ref 96–112)
Creatinine, Ser: 1.19 mg/dL (ref 0.50–1.35)
GFR calc Af Amer: 72 mL/min — ABNORMAL LOW (ref >90)
Potassium: 4.2 mEq/L (ref 3.5–5.1)
Sodium: 139 mEq/L (ref 135–145)

## 2012-06-29 LAB — PRO B NATRIURETIC PEPTIDE: Pro B Natriuretic peptide (BNP): 231.7 pg/mL — ABNORMAL HIGH (ref 0–125)

## 2012-06-29 LAB — TROPONIN I: Troponin I: <0.3 ng/mL (ref <0.30)

## 2012-06-29 NOTE — Progress Notes (Signed)
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  Prev PFT 2013 w/ no airflow obstruction.   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 ,  PFT 10/2011 w/ showed restrictive changes w/ no obstruction FEV1 2.27 (72%), ratio 80, TLC 64%, DLCO 44% pred.  06/28/2012 Post Hospital follow up  Initially admitted on Jun 12, 2012, with worsening left upper lobe opacity. He treated with IV azithromycin and Rocephin,  and was also diuresed aggressively. He was discharged on May 16 on Lasix twice a day, Readmitted 5/24 for worsening  dyspnea. He was found to have renal insufficiency, and also persistent left upper lobe air space disease with hypoxemia with exertion.  He did have an echocardiogram on the 15th of this month which showed a normal ejection fraction, but did have evidence for diastolic dysfunction. The left atrium was mildly dilated. There was no significant valvular disease. The right ventricle was fairly normal, however, his pulmonary artery pressures were not estimated.  Pt returns to office today with persistent dyspnea. Does not feel any better. LE swelling is improved .  He was treated with diuresis and steroids. Discharged on steroid taper and lasix 40mg  Twice daily  .   On arrival O2 sat 69% on RA. Placed on 2 l/m w/ sats at 93% at rest. CXR showed interval development of  mild diffuse interstitial pulmonary edema. Has minimal cough-mostly dry  No orthopnea or fever. No chest pain .  He will require readmission for further treatment.    SUBJECTIVE:  Very sob with walking . Denies pain, cough, phlegm.Legs always swell.  VITAL SIGNS: Temp:  [97.4 F (36.3 C)-98.2 F (36.8 C)] 97.4 F (36.3 C) (05/31 0408) Pulse Rate:  [46-80] 47 (05/31 0500) Resp:  [14-29] 14 (05/31 0500) BP: (104-156)/(53-86) 131/69 mmHg (05/31 0500) SpO2:  [91 %-99 %] 98 % (05/31 0500) Weight:  [154.9 kg (341 lb 7.9 oz)-160.846 kg (354 lb 9.6 oz)] 154.9 kg (341 lb 7.9 oz) (05/31 0500) HEMODYNAMICS:        INTAKE / OUTPUT: Intake/Output     05/30 0701 - 05/31 0700 05/31 0701 - 06/01 0700   P.O. 240 240   I.V. (mL/kg) 3 (0)    Total Intake(mL/kg) 243 (1.6) 240 (1.5)   Urine (mL/kg/hr) 3000    Total Output 3000     Net -2757 +240          PHYSICAL EXAMINATION: GEN: A/Ox3; pleasant , NAD, morbidly obese  HEENT: Mucosa clear and moist, speech clear,   NECK: Supple w/ fair ROM; no JVD; normal carotid impulses w/o bruits; no stridor; no lymphadenopathy.  RESP diminished BS in bases , w/o, wheezes/ rales/ or rhonchi. no accessory muscle use, no dullness to percussion  CARD: RRR, 2/6 SM , 2-3+ peripheral edema, pulses intact, no cyanosis or clubbing. Chronic stasis changes GI: Soft & nt;  nml bowel sounds;   Musco: Warm bil, no deformities or joint swelling noted.  Neuro: alert, no focal deficits noted.  Skin: Warm, no lesions or rashes   LABS:  Recent Labs Lab 06/22/12 1250 06/23/12 0546 06/24/12 0540 06/28/12 1241 06/28/12 1245 06/28/12 1813 06/29/12 0045 06/29/12 0517  HGB 11.7* 10.7*  --  11.4*  --   --   --  11.1*  WBC 4.7 3.9*  --  5.7  --   --   --  4.7  PLT 208 211  --  213  --   --   --  226  NA 137 136 138 137  --   --   --  139  K 3.9 3.8 4.1 4.3  --   --   --  4.2  CL 98 101 104 103  --   --   --  101  CO2 27 23 27 24   --   --   --  28  GLUCOSE 123*  111* 103* 143*  --   --   --  98  BUN 29* 24* 18 20  --   --   --  22  CREATININE 1.72* 1.36* 1.23 0.97  --   --   --  1.19  CALCIUM 9.6 9.0 9.1 9.1  --   --   --  9.2  MG  --   --   --  2.0  --   --   --   --   PHOS  --   --   --  2.6  --   --   --   --   AST 21  --   --  19  --   --   --   --   ALT 14  --   --  12  --   --   --   --   ALKPHOS 72  --   --  66  --   --   --   --   BILITOT 0.4  --   --  0.3  --   --   --   --   PROT 7.4  --   --  7.3  --   --   --   --   ALBUMIN 3.1*  --   --  3.1*  --   --   --   --   TROPONINI <0.30  --   --   --  <0.30 <0.30 <0.30  --   PROBNP 46.9  --   --   --  555.7*  --   --  231.7*   No results found for this basename: GLUCAP,  in the last 168 hours  CXR: 5/31 I reviewed CXR and recent CT. Mild improvement infiltrates. Left lower lung zone remains persistently dense. Coarse interstitial markings in that area on CT, and emphysema noted.  ASSESSMENT / PLAN:  PULMONARY  A: Acute Respiratory Failure w/ acute pulmonary edema  Persistent Left sided pulmonary infiltrates ? reoccurence vs edema  Hx of Adenocarcinoma of LLL s/p lobectomy/chemo 10/2011  ?OSA -no hypercapnia on recent abg/bmet  >CT chest 5/7>neg PE , aspdz in post left lung  P:   O2 for sat for sat >90% Diuresis as scr allows  Consider bronchoscopy for bs L base density.    CARDIOVASCULAR A: Diastolic Heart failure -decompensated w/ pulmonary edema  Echo 5/15>nml EF , diastolic dysfxn , no PAP noted. ? PAH  Dr .Shelle Iron note  from 5/30 expressed interest in getting R&L cath done for pressure assessment re question of pulmonary hypertension/ CHF. I discussed that this morning with Dr Sharyn Lull and support going forward with cath if he agrees.  P:  Card consulted with Dr. Sharyn Lull  Check labs w/ enzymes, BNP, Tsh  Check EKG  Cont home meds  Hep for DVT proph.   RENAL A:  Renal Insufficiency - reviewed P:   Follow electrolytes Replace K as indicated.  GASTROINTESTINAL A:   P:    Monitor   HEMATOLOGIC A:  Anemia  P:  Monitor    INFECTIOUS A:  No apparent infection -recent tx for possible PNA -completed course of abx  P:   Tr wbc and fever  Hold abx for now   ENDOCRINE A:  Obesity   P:   Monitor  Check TSH    NEUROLOGIC A:  Intact  P:   Monitor   TODAY'S SUMMARY:   Waymon Budge MD Pulmonary and Critical Care Medicine [facility] [phone]     After hours Pager: [phone]  06/29/2012, 10:12 AM

## 2012-06-29 NOTE — Progress Notes (Signed)
Subjective:  Patient states breathing has slightly improved after starting IV Lasix and Aldactone although still short of breath. Denies any chest pain. Patient had left cardiac cath approximately 6 years ago which showed mild coronary artery disease. No recent invasive cardiac workup. Patient does complaints of worsening exertional dyspnea lately.  Objective:  Vital Signs in the last 24 hours: Temp:  [97.4 F (36.3 C)-98.2 F (36.8 C)] 97.4 F (36.3 C) (05/31 0408) Pulse Rate:  [46-80] 47 (05/31 0500) Resp:  [14-29] 14 (05/31 0500) BP: (104-156)/(53-86) 131/69 mmHg (05/31 0500) SpO2:  [91 %-99 %] 98 % (05/31 0500) Weight:  [154.9 kg (341 lb 7.9 oz)-160.846 kg (354 lb 9.6 oz)] 154.9 kg (341 lb 7.9 oz) (05/31 0500)  Intake/Output from previous day: 05/30 0701 - 05/31 0700 In: 243 [P.O.:240; I.V.:3] Out: 3000 [Urine:3000] Intake/Output from this shift: Total I/O In: 240 [P.O.:240] Out: 500 [Urine:500]  Physical Exam: Neck: no adenopathy, no carotid bruit, no JVD and supple, symmetrical, trachea midline Lungs: Decreased breath sound at bases Heart: Regular rate and rhythm S1 and S2 soft Abdomen: soft, non-tender; bowel sounds normal; no masses,  no organomegaly  Lab Results:  Recent Labs  06/28/12 1241 06/29/12 0517  WBC 5.7 4.7  HGB 11.4* 11.1*  PLT 213 226    Recent Labs  06/28/12 1241 06/29/12 0517  NA 137 139  K 4.3 4.2  CL 103 101  CO2 24 28  GLUCOSE 143* 98  BUN 20 22  CREATININE 0.97 1.19    Recent Labs  06/28/12 1813 06/29/12 0045  TROPONINI <0.30 <0.30   Hepatic Function Panel  Recent Labs  06/28/12 1241  PROT 7.3  ALBUMIN 3.1*  AST 19  ALT 12  ALKPHOS 66  BILITOT 0.3   No results found for this basename: CHOL,  in the last 72 hours No results found for this basename: PROTIME,  in the last 72 hours  Imaging: Imaging results have been reviewed and Dg Chest 2 View  06/28/2012   *RADIOLOGY REPORT*  Clinical Data: 2-week history of  shortness of breath.  Follow up pneumonia.  Prior history of left lower lobectomy for lung cancer.  CHEST - 2 VIEW  Comparison: Portable chest x-ray 06/22/2012.  Two-view chest x-ray 06/12/2012, 06/05/2012.  CTA chest 06/05/2012.  Findings: Cardiac silhouette enlarged but stable.  Interval development of mild diffuse interstitial pulmonary edema as evidenced by Charyl Dancer B lines.  Postsurgical changes related to the prior left lower lobectomy.  Stable airspace opacities in the left lung base.  Stable baseline changes of COPD/emphysema.  Cardiac silhouette enlarged but stable.  Lateral images blurred by respiratory motion.  Mild degenerative changes involving the thoracic spine.  IMPRESSION: Interval development of mild diffuse interstitial pulmonary edema since the examination 6 days ago, query fluid overload.  Stable pneumonia at the left lung base superimposed upon COPD/emphysema.   Original Report Authenticated By: Hulan Saas, M.D.   Dg Chest Port 1 View  06/29/2012   *RADIOLOGY REPORT*  Clinical Data: Pulmonary edema, lung carcinoma  PORTABLE CHEST - 1 VIEW  Comparison:   the previous day's study  Findings: Some improvement in the right perihilar and infrahilar coarse infiltrates.  Persistent coarse airspace disease in the left lower lung with probable adjacent effusion.  Heart size upper limits normal.  Regional bones unremarkable.  IMPRESSION:  1.  Slight improvement in asymmetric infiltrates/edema as above   Original Report Authenticated By: D. Andria Rhein, MD    Cardiac Studies:  Assessment/Plan:  Decompensated acute  congestive heart failure secondary to diastolic dysfunction precipitated by volume overload and recent decreased Lasix dose. 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 Discussed briefly with patient regarding right and left cath his risk and benefits and consents for the procedure. Will tentatively schedule him for Monday.   LOS: 1 day    Olof Marcil N 06/29/2012, 10:45 AM

## 2012-06-30 ENCOUNTER — Inpatient Hospital Stay (HOSPITAL_COMMUNITY): Payer: Medicare Other

## 2012-06-30 LAB — BASIC METABOLIC PANEL
CO2: 29 mEq/L (ref 19–32)
Chloride: 98 mEq/L (ref 96–112)
Creatinine, Ser: 1.25 mg/dL (ref 0.50–1.35)
GFR calc Af Amer: 68 mL/min — ABNORMAL LOW (ref >90)
Sodium: 136 mEq/L (ref 135–145)

## 2012-06-30 MED ORDER — TECHNETIUM TC 99M SESTAMIBI GENERIC - CARDIOLITE
30.0000 | Freq: Once | INTRAVENOUS | Status: AC | PRN
  Administered 2012-06-30: 30 via INTRAVENOUS

## 2012-06-30 NOTE — Progress Notes (Signed)
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  Prev PFT 2013 w/ no airflow obstruction.   SIGNIFICANT EVENTS / STUDIES:  06/13/12 Echo nml EF, diastolic dysfxn  05/09/79 Cards consult -Harwani   LINES / TUBES:  CULTURES:   ANTIBIOTICS:   HISTORY OF PRESENT ILLNESS:   [name] is a 67 year old male, former smoker w/ dx of  adenocarcinoma of [name] left lower lobe 10/2011 , and underwent lobectomy and adjuvent chemotherapy with cis-platinum and ALIMTA. ,  PFT 10/2011 w/ showed restrictive changes w/ no obstruction FEV1 2.27 (72%), ratio 80, TLC 64%, DLCO 44% pred.  06/28/2012 Post Hospital follow up  Initially admitted on Jun 12, 2012, with worsening left upper lobe opacity. He treated with IV azithromycin and Rocephin,  and was also diuresed aggressively. He was discharged on May 16 on Lasix twice a day, Readmitted 5/24 for worsening  dyspnea. He was found to have renal insufficiency, and also persistent left upper lobe air space disease with hypoxemia with exertion.  He did have an echocardiogram on [name] 15th of this month which showed a normal ejection fraction, but did have evidence for diastolic dysfunction. [name] left atrium was mildly dilated. There was no significant valvular disease. [name] right ventricle was fairly normal, however, his pulmonary artery pressures were not estimated.  Pt returns to office today with persistent dyspnea. Does not feel any better. LE swelling is improved .  He was treated with diuresis and steroids. Discharged on steroid taper and lasix 40mg  Twice daily  .   On arrival O2 sat 69% on RA. Placed on 2 l/m w/ sats at 93% at rest. CXR showed interval development of  mild diffuse interstitial pulmonary edema. Has minimal cough-mostly dry  No orthopnea or fever. No chest pain .  He will require readmission for further treatment.    SUBJECTIVE: "Breathing a little bit better, not much" Nurse put him on O2 for sats 80% room air after mobilization to chair, standing  VITAL SIGNS: Temp:  [96.9 F (36.1 C)-98 F (36.7 C)] 97.4 F (36.3 C) (06/01 0800) Pulse Rate:  [52-78] 78 (06/01 0854) Resp:  [15-21] 15 (06/01 0800) BP: (101-121)/(52-69) 121/67 mmHg (06/01 0854) SpO2:  [91 %-100 %] 100 % (06/01 0800) Weight:  [155 kg (341 lb 11.4 oz)] 155 kg (341 lb 11.4 oz) (06/01 0430) HEMODYNAMICS:        INTAKE / OUTPUT: Intake/Output     05/31 0701 - 06/01 0700 06/01 0701 - 06/02 0700   P.O. 240 360   I.V. (mL/kg) 3 (0) 3 (0)   Total Intake(mL/kg) 243 (1.6) 363 (2.3)   Urine (mL/kg/hr) 1325 (0.4)    Total Output 1325     Net -1082 +363        Stool Occurrence 1 x      PHYSICAL EXAMINATION: GEN: A/Ox3; pleasant , NAD, morbidly obese  HEENT: Mucosa clear and moist, speech clear,   NECK: Supple w/ fair ROM; no JVD; normal carotid impulses w/o bruits; no stridor; no lymphadenopathy.  RESP diminished BS in bases , w/o, wheezes/ rales/ or rhonchi. no accessory muscle use, no dullness to percussion  CARD: RRR, 2/6 SM , 2-3+ peripheral  edema, pulses intact, no cyanosis or clubbing. Chronic stasis changes GI: Soft & nt; nml bowel sounds;   Musco: Warm bil, no deformities or joint swelling noted.  Neuro: alert, no focal deficits noted.  Skin: Warm, no lesions or rashes   LABS:  Recent Labs Lab 06/24/12 0540 06/28/12 1241 06/28/12 1245 06/28/12 1813 06/29/12 0045 06/29/12 0517 06/30/12 0348  HGB  --  11.4*  --   --   --  11.1*  --   WBC  --  5.7  --   --   --  4.7  --   PLT  --  213  --   --   --  226  --   NA 138 137  --   --   --  139 136  K 4.1 4.3  --   --   --  4.2 3.8  CL 104 103  --   --   --  101 98  CO2 27 24  --   --   --  28 29   GLUCOSE 103* 143*  --   --   --  98 92  BUN 18 20  --   --   --  22 23  CREATININE 1.23 0.97  --   --   --  1.19 1.25  CALCIUM 9.1 9.1  --   --   --  9.2 9.5  MG  --  2.0  --   --   --   --   --   PHOS  --  2.6  --   --   --   --   --   AST  --  19  --   --   --   --   --   ALT  --  12  --   --   --   --   --   ALKPHOS  --  66  --   --   --   --   --   BILITOT  --  0.3  --   --   --   --   --   PROT  --  7.3  --   --   --   --   --   ALBUMIN  --  3.1*  --   --   --   --   --   TROPONINI  --   --  <0.30 <0.30 <0.30  --   --   PROBNP  --   --  555.7*  --   --  231.7*  --    No results found for this basename: GLUCAP,  in [name] last 168 hours  CXR: 5/31 I reviewed CXR and recent CT. Mild improvement infiltrates. Left lower lung zone remains persistently dense. Coarse interstitial markings in that area on CT, and emphysema noted.  ASSESSMENT / PLAN:  PULMONARY  A: Acute Respiratory Failure w/ acute pulmonary edema  Persistent Left sided pulmonary infiltrates ? reoccurence vs edema  Hx of Adenocarcinoma of LLL s/p lobectomy/chemo 10/2011  ?OSA -no hypercapnia on recent abg/bmet  >CT chest 5/7>neg PE , aspdz in post left lung  P:   O2 for sat for sat >90% Diuresis as scr allows  Consider bronchoscopy for bs L base density.    CARDIOVASCULAR A: Diastolic Heart failure -decompensated w/ pulmonary edema  Echo 5/15>nml EF , diastolic dysfxn , no PAP noted. ? PAH  Dr .Shelle Iron note from 5/30 expressed interest in getting R&L  cath done for pressure assessment re question of pulmonary hypertension/ CHF. Dr Sharyn Lull plans Nuc Stress instead of cath out of concern for contrast nephropathy. P:  Card consulted with Dr. Sharyn Lull  Cont home meds  Hep for DVT proph.   RENAL A:  Renal Insufficiency - reviewed P:   Follow electrolytes Replace K as indicated.  GASTROINTESTINAL A:   P:   Monitor   HEMATOLOGIC A:  Anemia  P:  Monitor    INFECTIOUS A:  No apparent infection -recent tx  for possible PNA -completed course of abx  P:   Tr wbc and fever  Hold abx for now   ENDOCRINE A:  Obesity   P:   Monitor  Check TSH    NEUROLOGIC A:  Intact  P:   Monitor   TODAY'S SUMMARY: Note plan for Nuclear Stress test   Waymon Budge MD Pulmonary and Critical Care Medicine [facility] [phone]     After hours Pager: [phone]  06/30/2012, 10:00 AM

## 2012-06-30 NOTE — Progress Notes (Signed)
Subjective:  Patient denies any chest pain states his breathing is improved. Reviewed prior cardiac cath and mild disease but do to New onset of exertional dyspnea will schedule him for nuclear stress test first prior to considering left cath in view of recent renal insufficiency and high risk of contrast-induced nephropathy. Objective:  Vital Signs in the last 24 hours: Temp:  [96.9 F (36.1 C)-98 F (36.7 C)] 97.4 F (36.3 C) (06/01 0800) Pulse Rate:  [52-78] 78 (06/01 0854) Resp:  [15-21] 15 (06/01 0800) BP: (101-121)/(52-69) 121/67 mmHg (06/01 0854) SpO2:  [91 %-100 %] 100 % (06/01 0800) Weight:  [155 kg (341 lb 11.4 oz)] 155 kg (341 lb 11.4 oz) (06/01 0430)  Intake/Output from previous day: 05/31 0701 - 06/01 0700 In: 243 [P.O.:240; I.V.:3] Out: 1325 [Urine:1325] Intake/Output from this shift: Total I/O In: 363 [P.O.:360; I.V.:3] Out: -   Physical Exam: Neck: no adenopathy, no carotid bruit, no JVD and supple, symmetrical, trachea midline Lungs: clear to auscultation bilaterally Heart: regular rate and rhythm, S1, S2 normal, no murmur, click, rub or gallop Abdomen: soft, non-tender; bowel sounds normal; no masses,  no organomegaly Extremities: No clubbing cyanosis 1+ edema right leg chronic leg swelling left leg  Lab Results:  Recent Labs  06/28/12 1241 06/29/12 0517  WBC 5.7 4.7  HGB 11.4* 11.1*  PLT 213 226    Recent Labs  06/29/12 0517 06/30/12 0348  NA 139 136  K 4.2 3.8  CL 101 98  CO2 28 29  GLUCOSE 98 92  BUN 22 23  CREATININE 1.19 1.25    Recent Labs  06/28/12 1813 06/29/12 0045  TROPONINI <0.30 <0.30   Hepatic Function Panel  Recent Labs  06/28/12 1241  PROT 7.3  ALBUMIN 3.1*  AST 19  ALT 12  ALKPHOS 66  BILITOT 0.3   No results found for this basename: CHOL,  in the last 72 hours No results found for this basename: PROTIME,  in the last 72 hours  Imaging: Imaging results have been reviewed and Dg Chest 2 View  06/28/2012    *RADIOLOGY REPORT*  Clinical Data: 2-week history of shortness of breath.  Follow up pneumonia.  Prior history of left lower lobectomy for lung cancer.  CHEST - 2 VIEW  Comparison: Portable chest x-ray 06/22/2012.  Two-view chest x-ray 06/12/2012, 06/05/2012.  CTA chest 06/05/2012.  Findings: Cardiac silhouette enlarged but stable.  Interval development of mild diffuse interstitial pulmonary edema as evidenced by Charyl Dancer B lines.  Postsurgical changes related to the prior left lower lobectomy.  Stable airspace opacities in the left lung base.  Stable baseline changes of COPD/emphysema.  Cardiac silhouette enlarged but stable.  Lateral images blurred by respiratory motion.  Mild degenerative changes involving the thoracic spine.  IMPRESSION: Interval development of mild diffuse interstitial pulmonary edema since the examination 6 days ago, query fluid overload.  Stable pneumonia at the left lung base superimposed upon COPD/emphysema.   Original Report Authenticated By: Hulan Saas, M.D.   Dg Chest Port 1 View  06/29/2012   *RADIOLOGY REPORT*  Clinical Data: Pulmonary edema, lung carcinoma  PORTABLE CHEST - 1 VIEW  Comparison:   the previous day's study  Findings: Some improvement in the right perihilar and infrahilar coarse infiltrates.  Persistent coarse airspace disease in the left lower lung with probable adjacent effusion.  Heart size upper limits normal.  Regional bones unremarkable.  IMPRESSION:  1.  Slight improvement in asymmetric infiltrates/edema as above   Original Report Authenticated By: D.  Andria Rhein, MD    Cardiac Studies:  Assessment/Plan:  Decompensated acute congestive heart failure secondary to diastolic dysfunction precipitated by volume overload and recent decreased Lasix dose. 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

## 2012-07-01 ENCOUNTER — Inpatient Hospital Stay (HOSPITAL_COMMUNITY): Payer: Medicare Other

## 2012-07-01 DIAGNOSIS — N179 Acute kidney failure, unspecified: Secondary | ICD-10-CM

## 2012-07-01 DIAGNOSIS — R918 Other nonspecific abnormal finding of lung field: Secondary | ICD-10-CM

## 2012-07-01 MED ORDER — SODIUM CHLORIDE 0.9 % IJ SOLN: 3.0000 mL | INTRAMUSCULAR | Status: DC | PRN

## 2012-07-01 MED ORDER — SODIUM CHLORIDE 0.9 % IV SOLN
250.0000 mL | INTRAVENOUS | Status: DC | PRN
  Administered 2012-07-02: 250 mL via INTRAVENOUS

## 2012-07-01 MED ORDER — ASPIRIN 81 MG PO CHEW
324.0000 mg | CHEWABLE_TABLET | ORAL | Status: AC
  Administered 2012-07-02: 324 mg via ORAL
  Filled 2012-07-01: qty 4

## 2012-07-01 MED ORDER — ASPIRIN EC 81 MG PO TBEC: 81.0000 mg | DELAYED_RELEASE_TABLET | Freq: Every day | ORAL | Status: DC

## 2012-07-01 MED ORDER — REGADENOSON 0.4 MG/5ML IV SOLN
0.4000 mg | Freq: Once | INTRAVENOUS | Status: AC
  Administered 2012-07-01: 0.4 mg via INTRAVENOUS
  Filled 2012-07-01: qty 5

## 2012-07-01 MED ORDER — TECHNETIUM TC 99M SESTAMIBI GENERIC - CARDIOLITE
30.0000 | Freq: Once | INTRAVENOUS | Status: AC | PRN
  Administered 2012-07-01: 30 via INTRAVENOUS

## 2012-07-01 MED ORDER — SODIUM CHLORIDE 0.9 % IJ SOLN
3.0000 mL | Freq: Two times a day (BID) | INTRAMUSCULAR | Status: DC
  Administered 2012-07-01: 3 mL via INTRAVENOUS

## 2012-07-01 NOTE — Progress Notes (Signed)
R. Groin clipped, Pt watching PCI/ heart cath video now. [name]

## 2012-07-01 NOTE — Progress Notes (Signed)
Subjective: Stable at rest on low flow oxygen, ++dyspnea and hypoxemia with exertion.  No cough or purulence noted. Diuresing slowly.   Objective: Vital signs in last 24 hours: Blood pressure 105/57, pulse 70, temperature 98 F (36.7 C), temperature source Oral, resp. rate 13, height 5\' 11"  (1.803 m), weight 153.7 kg (338 lb 13.6 oz), SpO2 94.00%.  Intake/Output from previous day: 06/01 0701 - 06/02 0700 In: 846 [P.O.:840; I.V.:6] Out: 1450 [Urine:1450]   Physical Exam:   morbidly obese male in nad Nose without purulence or discharge noted Neck without LN or TMG Chest with bibasilar crackles, left worse than right.  No wheezing Cor with rrr, distant LE with 3+ edema, no cyanosis noted. Alert and oriented, moves all 4.    Lab Results:  Recent Labs  06/28/12 1241 06/29/12 0517  WBC 5.7 4.7  HGB 11.4* 11.1*  HCT 33.7* 33.8*  PLT 213 226   BMET  Recent Labs  06/28/12 1241 06/29/12 0517 06/30/12 0348  NA 137 139 136  K 4.3 4.2 3.8  CL 103 101 98  CO2 24 28 29   GLUCOSE 143* 98 92  BUN 20 22 23   CREATININE 0.97 1.19 1.25  CALCIUM 9.1 9.2 9.5    Studies/Results: No results found.  Assessment/Plan:  1) bilateral pulmonary infiltrates with acute hypoxic respiratory failure.   It is unclear how much of this is due to fluid overload from his diastolic dysfunction vs recurrence of his lung cancer in remaining LUL.  Would not think the LUL abnormality alone would cause this degree of dyspnea and hypoxemia, but he may have high level V/Q mismatching thru that area which could cause this.  The pt clearly has significant emphysematous changes on CT chest, but does not have airflow obstruction on his PFT's.  His lungs are clear except for basilar crackles noted. He is morbidly obese, but does not have OHS per his normal pCO2 on ABG.  He may have restriction from his obesity.    -continue oxygen, OOB - await results of stress test today -would like to have documentation of  his left atrial pressures, and if not elevated, will need bronch with bx of LUL.  However, he is at high risk for biopsy given his emphysematous changes in the LUL.    2) diastolic dysfunction with ?CHF/volume overload  Attempts at diuresis have been limited by worsening renal function.  Is this because his left sided pressures are not that elevated?  Or is it because of his stiff LV with underlying renal insufficiency?  I think he would benefit from at least a right heart catheterization to determine left sided and right sided pressures to answer this question.  This would not put him at risk for worsening renal insuff, and would push [place] toward bronchoscopy with biopsy if LA pressure not that elevated.   -followup nuclear stress testing to r/o ischemia -would consider right heart cath to determine left sided pressures.   3) renal insufficiency  Suspect pt has underlying renal disease that is limiting our ability to diurese aggressively.   -continue to monitor renal function while diuresing.   Barbaraann Share, M.D. 07/01/2012, 8:40 AM

## 2012-07-01 NOTE — Progress Notes (Signed)
Subjective:  Patient denies any chest pain states breathing has improved slightly. Schedule for nuclear stress test today if negative for ischemia will schedule him for right heart cath tomorrow and if positive for ischemia will schedule him for left and right heart cath.  Objective:  Vital Signs in the last 24 hours: Temp:  [97.5 F (36.4 C)-98 F (36.7 C)] 98 F (36.7 C) (06/02 0735) Pulse Rate:  [25-70] 70 (06/02 0735) Resp:  [13-29] 13 (06/02 0735) BP: (102-127)/(41-70) 126/49 mmHg (06/02 1017) SpO2:  [91 %-98 %] 94 % (06/02 0735) Weight:  [153.7 kg (338 lb 13.6 oz)] 153.7 kg (338 lb 13.6 oz) (06/02 0558)  Intake/Output from previous day: 06/01 0701 - 06/02 0700 In: 846 [P.O.:840; I.V.:6] Out: 1450 [Urine:1450] Intake/Output from this shift:    Physical Exam: Neck: no adenopathy, no carotid bruit, no JVD and supple, symmetrical, trachea midline Lungs: Decrease vessel at bases with occasional crackles Heart: regular rate and rhythm, S1, S2 normal and Soft systolic murmur and S3 gallop noted Abdomen: soft, non-tender; bowel sounds normal; no masses,  no organomegaly Extremities: No clubbing cyanosis 2+ edema noted left more than right  Lab Results:  Recent Labs  06/28/12 1241 06/29/12 0517  WBC 5.7 4.7  HGB 11.4* 11.1*  PLT 213 226    Recent Labs  06/29/12 0517 06/30/12 0348  NA 139 136  K 4.2 3.8  CL 101 98  CO2 28 29  GLUCOSE 98 92  BUN 22 23  CREATININE 1.19 1.25    Recent Labs  06/28/12 1813 06/29/12 0045  TROPONINI <0.30 <0.30   Hepatic Function Panel  Recent Labs  06/28/12 1241  PROT 7.3  ALBUMIN 3.1*  AST 19  ALT 12  ALKPHOS 66  BILITOT 0.3   No results found for this basename: CHOL,  in the last 72 hours No results found for this basename: PROTIME,  in the last 72 hours  Imaging: Imaging results have been reviewed and No results found.  Cardiac Studies:  Assessment/Plan:  Resolving Decompensated acute congestive heart failure  secondary to diastolic dysfunction precipitated by volume overload and recent decreased Lasix dose. 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 Will schedule for cardiac cath tomorrow depending on the results of stress test  LOS: 3 days    Neva Ramaswamy N 07/01/2012, 10:53 AM

## 2012-07-02 ENCOUNTER — Encounter (HOSPITAL_COMMUNITY)
Admission: AD | Disposition: A | Discharge status: Home / Self Care | Payer: Self-pay | Source: Ambulatory Visit | Attending: Pulmonary Disease

## 2012-07-02 HISTORY — PX: RIGHT HEART CATHETERIZATION: SHX5447

## 2012-07-02 LAB — CBC
MCHC: 33.2 g/dL (ref 30.0–36.0)
RDW: 16 % — ABNORMAL HIGH (ref 11.5–15.5)

## 2012-07-02 LAB — POCT I-STAT 3, VENOUS BLOOD GAS (G3P V)
Acid-Base Excess: 3 mmol/L — ABNORMAL HIGH (ref 0.0–2.0)
Acid-Base Excess: 7 mmol/L — ABNORMAL HIGH (ref 0.0–2.0)
Bicarbonate: 27.7 mEq/L — ABNORMAL HIGH (ref 20.0–24.0)
Bicarbonate: 29.7 mEq/L — ABNORMAL HIGH (ref 20.0–24.0)
Bicarbonate: 29.7 mEq/L — ABNORMAL HIGH (ref 20.0–24.0)
O2 Saturation: 72 %
O2 Saturation: 75 %
TCO2: 30 mmol/L (ref 0–100)
TCO2: 31 mmol/L (ref 0–100)
pCO2, Ven: 48.6 mmHg (ref 45.0–50.0)
pCO2, Ven: 45.6 mmHg (ref 45.0–50.0)
pCO2, Ven: 47 mmHg (ref 45.0–50.0)
pCO2, Ven: 48.5 mmHg (ref 45.0–50.0)
pH, Ven: 7.376 — ABNORMAL HIGH (ref 7.250–7.300)
pH, Ven: 7.396 — ABNORMAL HIGH (ref 7.250–7.300)
pO2, Ven: 37 mmHg (ref 30.0–45.0)
pO2, Ven: 38 mmHg (ref 30.0–45.0)

## 2012-07-02 LAB — BASIC METABOLIC PANEL
BUN: 21 mg/dL (ref 6–23)
CO2: 30 mEq/L (ref 19–32)
Calcium: 9.3 mg/dL (ref 8.4–10.5)
Creatinine, Ser: 1.23 mg/dL (ref 0.50–1.35)
GFR calc non Af Amer: 59 mL/min — ABNORMAL LOW (ref >90)
Glucose, Bld: 101 mg/dL — ABNORMAL HIGH (ref 70–99)
Sodium: 138 mEq/L (ref 135–145)

## 2012-07-02 LAB — CREATININE, SERUM
Creatinine, Ser: 1.14 mg/dL (ref 0.50–1.35)
GFR calc non Af Amer: 65 mL/min — ABNORMAL LOW (ref >90)

## 2012-07-02 LAB — PROTIME-INR: INR: 1.04 (ref 0.00–1.49)

## 2012-07-02 SURGERY — RIGHT HEART CATH
Anesthesia: LOCAL

## 2012-07-02 MED ORDER — ATORVASTATIN CALCIUM 40 MG PO TABS
40.0000 mg | ORAL_TABLET | Freq: Every day | ORAL | Status: DC
  Administered 2012-07-02 – 2012-07-05 (×4): 40 mg via ORAL
  Filled 2012-07-02 (×4): qty 1

## 2012-07-02 MED ORDER — ACETAMINOPHEN 325 MG PO TABS: 650.0000 mg | ORAL_TABLET | ORAL | Status: DC | PRN

## 2012-07-02 MED ORDER — ONDANSETRON HCL 4 MG/2ML IJ SOLN: 4.0000 mg | Freq: Four times a day (QID) | INTRAMUSCULAR | Status: DC | PRN

## 2012-07-02 MED ORDER — HEPARIN (PORCINE) IN NACL 2-0.9 UNIT/ML-% IJ SOLN
INTRAMUSCULAR | Status: AC
  Filled 2012-07-02: qty 500

## 2012-07-02 MED ORDER — HEPARIN SODIUM (PORCINE) 5000 UNIT/ML IJ SOLN
5000.0000 [IU] | Freq: Three times a day (TID) | INTRAMUSCULAR | Status: DC
  Administered 2012-07-02 (×2): 5000 [IU] via SUBCUTANEOUS
  Filled 2012-07-02 (×6): qty 1

## 2012-07-02 MED ORDER — LIDOCAINE HCL (PF) 1 % IJ SOLN
INTRAMUSCULAR | Status: AC
  Filled 2012-07-02: qty 30
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
## 2012-07-02 NOTE — CV Procedure (Signed)
Right heart cath report dictated on 07/02/2012 dictation number is [phone]

## 2012-07-02 NOTE — Progress Notes (Signed)
Subjective: Stable at rest on low flow oxygen, ++dyspnea and hypoxemia with exertion.  No cough or purulence noted. Diuresing slowly. For right heart cath this am since nuclear stress test negative for ischemia.  It did show global hypokinesis and EF 44%.   Objective: Vital signs in last 24 hours: Blood pressure 110/63, pulse 62, temperature 98.1 F (36.7 C), temperature source Oral, resp. rate 15, height 5\' 11"  (1.803 m), weight 153.7 kg (338 lb 13.6 oz), SpO2 95.00%.  Intake/Output from previous day: 06/02 0701 - 06/03 0700 In: 225 [P.O.:222; I.V.:3] Out: 1250 [Urine:1250]   Physical Exam:   morbidly obese male in nad Nose without purulence or discharge noted Neck without LN or TMG Chest with bibasilar crackles, left worse than right.  No wheezing Cor with rrr, distant, no murmur LE with 3+ edema, no cyanosis noted. Alert and oriented, moves all 4.    Lab Results: No results found for this basename: WBC, HGB, HCT, PLT,  in the last 72 hours BMET  Recent Labs  06/30/12 0348 07/02/12 0350  NA 136 138  K 3.8 3.9  CL 98 99  CO2 29 30  GLUCOSE 92 101*  BUN 23 21  CREATININE 1.25 1.23  CALCIUM 9.5 9.3    Studies/Results: Nm Myocar Multi W/spect W/wall Motion / Ef  07/01/2012   *RADIOLOGY REPORT*  Clinical Data:  Congestive heart failure.  67 year old male.  MYOCARDIAL IMAGING WITH SPECT (REST AND PHARMACOLOGIC-STRESS) GATED LEFT VENTRICULAR WALL MOTION STUDY LEFT VENTRICULAR EJECTION FRACTION - 2 DAY EXAM  Technique:  Resting myocardial SPECT imaging was initially performed after intravenous administration of radiopharmaceutical. Myocardial SPECT was subsequently performed on day 2 after additional radiopharmaceutical injection during pharmacologic- stress supervised by the Cardiology staff.  Quantitative gated imaging was also performed to evaluate left ventricular wall motion, and estimate left ventricular ejection fraction.  Radiopharmaceutical:  Tc-54m Cardiolite 30 mCi at  rest and during stress.  Comparison: none  Findings:  Technique: Study is adequate.  Perfusion:  There are no relative decreased counts on stress or rest to suggest reversible ischemia or infarction.  The left ventricle appears slightly more dilated at stress than rest.  Wall motion:  Global hypokinesia.  Left ventricular ejection fraction:  Calculated left ventricular ejection fraction = 44%  End-diastolic volume equals 126 millimeters  End-systolic volume 71 milliliters  IMPRESSION:  1.  No reversible ischemia or infarction. 2.  Left ventricle is slightly more dilated at stress than rest. 3.  Global hypokinesia.  4.  3.  Left ventricular ejection fraction equals 44%   Original Report Authenticated By: Genevive Bi, M.D.    Assessment/Plan:  1) bilateral pulmonary infiltrates with acute hypoxic respiratory failure.   It is unclear how much of this is due to fluid overload from his diastolic dysfunction vs recurrence of his lung cancer in remaining LUL.  Would not think the LUL abnormality alone would cause this degree of dyspnea and hypoxemia, but he may have high level V/Q mismatching thru that area which could cause this.  The pt clearly has significant emphysematous changes on CT chest, but does not have airflow obstruction on his PFT's.  His lungs are clear except for basilar crackles noted. He is morbidly obese, but does not have OHS per his normal pCO2 on ABG.  He may have restriction from his obesity, and possibly sleep apnea   -continue oxygen, OOB - await results of RHC, and will do FOB with bx if left sided pressures are unremarkable.  He is at high risk for biopsy given his emphysematous changes in the LUL.    2) diastolic dysfunction with ?CHF/volume overload  Attempts at diuresis have been limited by worsening renal function.  Is this because his left sided pressures are not that elevated?  Or is it because of his stiff LV with underlying renal insufficiency?  He is at least  diuresing steadily now with stable renal function.   I think he would benefit from at least a right heart catheterization to determine left sided and right sided pressures to answer this question. His nuclear stress test yesterday did show depressed EF with global HK.   -await results of RHC.  3) renal insufficiency  Suspect pt has underlying renal disease that is limiting our ability to diurese aggressively.   -continue to monitor renal function while diuresing.   Barbaraann Share, M.D. 07/02/2012, 8:42 AM

## 2012-07-02 NOTE — Progress Notes (Signed)
Subjective:  Patient denies any chest pain states his breathing is gradually improving. Discussed with patient at length regarding right heart cath this risk and benefits and consents for the procedure  Objective:  Vital Signs in the last 24 hours: Temp:  [97.4 F (36.3 C)-98.4 F (36.9 C)] 98.1 F (36.7 C) (06/03 0746) Pulse Rate:  [56-69] 63 (06/03 0746) Resp:  [15-21] 15 (06/03 0746) BP: (102-138)/(41-69) 110/63 mmHg (06/03 0821) SpO2:  [92 %-99 %] 95 % (06/03 0746)  Intake/Output from previous day: 06/02 0701 - 06/03 0700 In: 235 [P.O.:222; I.V.:13] Out: 1250 [Urine:1250] Intake/Output from this shift: Total I/O In: 10 [I.V.:10] Out: 250 [Urine:250]  Physical Exam: Neck: no adenopathy, no carotid bruit, no JVD and supple, symmetrical, trachea midline Lungs: Decreased breath sound at bases with fine rales Heart: regular rate and rhythm, S1, S2 normal and Soft systolic murmur noted Abdomen: soft, non-tender; bowel sounds normal; no masses,  no organomegaly Extremities: No clubbing cyanosis 2+ edema left more than the right  Lab Results: No results found for this basename: WBC, HGB, PLT,  in the last 72 hours  Recent Labs  06/30/12 0348 07/02/12 0350  NA 136 138  K 3.8 3.9  CL 98 99  CO2 29 30  GLUCOSE 92 101*  BUN 23 21  CREATININE 1.25 1.23   No results found for this basename: TROPONINI, CK, MB,  in the last 72 hours Hepatic Function Panel No results found for this basename: PROT, ALBUMIN, AST, ALT, ALKPHOS, BILITOT, BILIDIR, IBILI,  in the last 72 hours No results found for this basename: CHOL,  in the last 72 hours No results found for this basename: PROTIME,  in the last 72 hours  Imaging: Imaging results have been reviewed and Nm Myocar Multi W/spect W/wall Motion / Ef  07/01/2012   *RADIOLOGY REPORT*  Clinical Data:  Congestive heart failure.  67 year old male.  MYOCARDIAL IMAGING WITH SPECT (REST AND PHARMACOLOGIC-STRESS) GATED LEFT VENTRICULAR WALL  MOTION STUDY LEFT VENTRICULAR EJECTION FRACTION - 2 DAY EXAM  Technique:  Resting myocardial SPECT imaging was initially performed after intravenous administration of radiopharmaceutical. Myocardial SPECT was subsequently performed on day 2 after additional radiopharmaceutical injection during pharmacologic- stress supervised by the Cardiology staff.  Quantitative gated imaging was also performed to evaluate left ventricular wall motion, and estimate left ventricular ejection fraction.  Radiopharmaceutical:  Tc-35m Cardiolite 30 mCi at rest and during stress.  Comparison: none  Findings:  Technique: Study is adequate.  Perfusion:  There are no relative decreased counts on stress or rest to suggest reversible ischemia or infarction.  The left ventricle appears slightly more dilated at stress than rest.  Wall motion:  Global hypokinesia.  Left ventricular ejection fraction:  Calculated left ventricular ejection fraction = 44%  End-diastolic volume equals 126 millimeters  End-systolic volume 71 milliliters  IMPRESSION:  1.  No reversible ischemia or infarction. 2.  Left ventricle is slightly more dilated at stress than rest. 3.  Global hypokinesia.  4.  3.  Left ventricular ejection fraction equals 44%   Original Report Authenticated By: Genevive Bi, M.D.    Cardiac Studies:  Assessment/Plan:  Resolving Decompensated acute congestive heart failure secondary to diastolic dysfunction/systolic dysfunction precipitated by volume overload and recent decreased Lasix dose.  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  LOS: 4 days    Miabella Shannahan N 07/02/2012, 9:17 AM

## 2012-07-03 ENCOUNTER — Inpatient Hospital Stay (HOSPITAL_COMMUNITY): Payer: Medicare Other

## 2012-07-03 ENCOUNTER — Telehealth: Payer: Self-pay | Admitting: Pulmonary Disease

## 2012-07-03 ENCOUNTER — Encounter (HOSPITAL_COMMUNITY)
Admission: AD | Disposition: A | Discharge status: Home / Self Care | Payer: Self-pay | Source: Ambulatory Visit | Attending: Pulmonary Disease

## 2012-07-03 HISTORY — PX: VIDEO BRONCHOSCOPY: SHX5072

## 2012-07-03 LAB — BODY FLUID CELL COUNT WITH DIFFERENTIAL
Eos, Fluid: 3 %
Lymphs, Fluid: 2 %
Neutrophil Count, Fluid: 66 % — ABNORMAL HIGH (ref 0–25)

## 2012-07-03 LAB — BASIC METABOLIC PANEL
BUN: 16 mg/dL (ref 6–23)
Chloride: 102 mEq/L (ref 96–112)
Creatinine, Ser: 1.1 mg/dL (ref 0.50–1.35)
GFR calc Af Amer: 79 mL/min — ABNORMAL LOW (ref >90)

## 2012-07-03 SURGERY — BRONCHOSCOPY, WITH FLUOROSCOPY
Anesthesia: Moderate Sedation | Laterality: Bilateral

## 2012-07-03 MED ORDER — MIDAZOLAM HCL 10 MG/2ML IJ SOLN
INTRAMUSCULAR | Status: DC | PRN
  Administered 2012-07-03: 5 mg via INTRAVENOUS

## 2012-07-03 MED ORDER — PHENYLEPHRINE HCL 0.25 % NA SOLN
NASAL | Status: DC | PRN
  Administered 2012-07-03: 2 via NASAL

## 2012-07-03 MED ORDER — LIDOCAINE HCL (PF) 1 % IJ SOLN
INTRAMUSCULAR | Status: DC | PRN
  Administered 2012-07-03: 5 mL

## 2012-07-03 MED ORDER — SODIUM CHLORIDE 0.9 % IV SOLN: Freq: Once | INTRAVENOUS | Status: DC

## 2012-07-03 MED ORDER — MEPERIDINE HCL 100 MG/ML IJ SOLN
INTRAMUSCULAR | Status: AC
  Filled 2012-07-03: qty 2

## 2012-07-03 MED ORDER — MEPERIDINE HCL 25 MG/ML IJ SOLN
INTRAMUSCULAR | Status: DC | PRN
  Administered 2012-07-03: 50 mg via INTRAVENOUS

## 2012-07-03 MED ORDER — MIDAZOLAM HCL 5 MG/ML IJ SOLN
INTRAMUSCULAR | Status: AC
  Filled 2012-07-03: qty 2

## 2012-07-03 MED ORDER — TECHNETIUM TO 99M ALBUMIN AGGREGATED
6.0000 | Freq: Once | INTRAVENOUS | Status: AC | PRN
  Administered 2012-07-03: 6 via INTRAVENOUS

## 2012-07-03 MED ORDER — LIDOCAINE HCL 2 % EX GEL
CUTANEOUS | Status: DC | PRN
  Administered 2012-07-03: 1

## 2012-07-03 MED ORDER — TECHNETIUM TC 99M DIETHYLENETRIAME-PENTAACETIC ACID: 40.0000 | Freq: Once | INTRAVENOUS | Status: AC | PRN

## 2012-07-03 NOTE — Op Note (Signed)
Dictation #:  [id number]

## 2012-07-03 NOTE — Progress Notes (Signed)
CXR complete.

## 2012-07-03 NOTE — Interval H&P Note (Signed)
History and Physical Interval Note:  07/03/2012 7:40 AM  [name]  has presented today for surgery, with the diagnosis of Pulmonary Infiltrates  The various methods of treatment have been discussed with the patient and family. After consideration of risks, benefits and other options for treatment, the patient has consented to  Procedure(s): VIDEO BRONCHOSCOPY WITH FLUORO (Bilateral) as a surgical intervention .  The patient's history has been reviewed, patient examined, no change in status, stable for surgery.  I have reviewed the patient's chart and labs.  Questions were answered to the patient's satisfaction.     Barbaraann Share

## 2012-07-03 NOTE — Op Note (Signed)
NAME:  [name], [name] NO.:  [id number]  MEDICAL RECORD NO.:  [id number]  LOCATION:  [facility]                         FACILITY:  [facility]  PHYSICIAN:  Barbaraann Share, MD,FCCPDATE OF BIRTH:  1945/11/15  DATE OF PROCEDURE:  07/03/2012 DATE OF DISCHARGE:                              OPERATIVE REPORT   PROCEDURE:  Flexible fiberoptic bronchoscopy with biopsy.  OPERATOR:  Barbaraann Share, MD, FCCP.  INDICATION FOR THE PROCEDURE:  Pulmonary infiltrates of unknown origin in a patient with a recent diagnosis of adenocarcinoma on the lung.  ANESTHESIA:  Demerol 50 mg IV, Versed 5 mg IV, and topical 1% lidocaine to the vocal cords and airways during the procedure.  DESCRIPTION OF PROCEDURE:  After obtaining informed consent and under close cardiopulmonary monitoring, the above preop anesthesia was given and the fiberoptic scope was passed through the right naris and into the posterior pharynx where there were no lesions or other abnormalities seen.  The vocal cords appeared to be within normal limits and moved bilaterally on phonation.  Scope was then passed into the trachea where it was examined along its entire length down to the level of the carina, all of which was normal.  The right tracheobronchial tree was examined to the subsegmental level with no endobronchial abnormality being found. Attention was then paid to the left tracheobronchial tree where a well- healed left lower lobe endobronchial stump was noted from his prior lobectomy.  The suture line appeared to be intact and there was no evidence for local recurrence.  The upper division and the lingula appeared to be within normal limits with no endobronchial abnormality being found.  Bronchoalveolar lavage was then done from the segments of the lingular bronchus with good material being obtained.  Bronchial brushes were also done from the lingula under fluoroscopic guidance with good material being obtained.   Finally, transbronchial lung biopsies were done under fluoroscopic guidance without complication.  There was good hemostasis throughout the whole procedure, and the patient maintained his oxygen saturations.  Overall, he tolerated the procedure well and there were no complications.  Chest x-ray is pending at the time of dictation to rule out pneumothorax post transbronchial lung biopsy.     Barbaraann Share, MD,FCCP     [facility]MED[facility]  D:  07/03/2012  T:  07/03/2012  Job:  [id number]

## 2012-07-03 NOTE — Progress Notes (Signed)
Bronchscopy performed with interventions, BAL, Biopsy, Brushings.

## 2012-07-03 NOTE — Progress Notes (Signed)
Subjective:  Patient denies any chest pain states breathing is about the same. Underwent fiberoptic bronchoscopy and biopsy today tolerated procedure well.  Objective:  Vital Signs in the last 24 hours: Temp:  [97.5 F (36.4 C)-99 F (37.2 C)] 97.5 F (36.4 C) (06/04 1145) Pulse Rate:  [47-103] 71 (06/04 1145) Resp:  [10-61] 16 (06/04 1145) BP: (79-156)/(47-87) 120/62 mmHg (06/04 1145) SpO2:  [85 %-100 %] 91 % (06/04 1145) Weight:  [154.8 kg (341 lb 4.4 oz)] 154.8 kg (341 lb 4.4 oz) (06/04 0419)  Intake/Output from previous day: 06/03 0701 - 06/04 0700 In: 760 [P.O.:720; I.V.:40] Out: 1350 [Urine:1350] Intake/Output from this shift: Total I/O In: -  Out: 1350 [Urine:1350]  Physical Exam: Neck: no adenopathy, no carotid bruit, no JVD and supple, symmetrical, trachea midline Lungs: Decreased breath sound at left base Heart: regular rate and rhythm, S1, S2 normal, no murmur, click, rub or gallop Abdomen: soft, non-tender; bowel sounds normal; no masses,  no organomegaly Extremities: extremities normal, atraumatic, no cyanosis or edema and Right groin dressing dry   Lab Results:  Recent Labs  07/02/12 1355  WBC 3.2*  HGB 11.3*  PLT 210    Recent Labs  07/02/12 0350 07/02/12 1355 07/03/12 0415  NA 138  --  138  K 3.9  --  4.0  CL 99  --  102  CO2 30  --  29  GLUCOSE 101*  --  94  BUN 21  --  16  CREATININE 1.23 1.14 1.10   No results found for this basename: TROPONINI, CK, MB,  in the last 72 hours Hepatic Function Panel No results found for this basename: PROT, ALBUMIN, AST, ALT, ALKPHOS, BILITOT, BILIDIR, IBILI,  in the last 72 hours No results found for this basename: CHOL,  in the last 72 hours No results found for this basename: PROTIME,  in the last 72 hours  Imaging: Imaging results have been reviewed and Dg Chest Port 1 View  07/03/2012   *RADIOLOGY REPORT*  Clinical Data: Status post bronchoscopy  PORTABLE CHEST - 1 VIEW  Comparison: Chest radiograph  from 06/29/2012  Findings: Heart size is normal.  Asymmetric opacification within the left midlung and left base is unchanged from previous exam.  No pneumothorax after bronchoscopy identified.  IMPRESSION:  1.  No change in aeration to the left midlung and left base.   Original Report Authenticated By: Signa Kell, M.D.    Cardiac Studies:  Assessment/Plan:  compensated acute congestive heart failure secondary to diastolic dysfunction/systolic dysfunction precipitated by volume overload and recent decreased Lasix dose.  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

## 2012-07-04 MED ORDER — RAMIPRIL 2.5 MG PO CAPS
2.5000 mg | ORAL_CAPSULE | Freq: Every day | ORAL | Status: DC
  Administered 2012-07-04 – 2012-07-05 (×2): 2.5 mg via ORAL
  Filled 2012-07-04 (×2): qty 1

## 2012-07-04 MED ORDER — ENOXAPARIN SODIUM 40 MG/0.4ML ~~LOC~~ SOLN
40.0000 mg | SUBCUTANEOUS | Status: DC
  Administered 2012-07-04: 40 mg via SUBCUTANEOUS
  Filled 2012-07-04 (×2): qty 0.4

## 2012-07-04 MED ORDER — FUROSEMIDE 80 MG PO TABS
80.0000 mg | ORAL_TABLET | Freq: Every day | ORAL | Status: DC
  Administered 2012-07-04 – 2012-07-05 (×2): 80 mg via ORAL
  Filled 2012-07-04 (×2): qty 1

## 2012-07-04 NOTE — Progress Notes (Signed)
Look through pt's V/Q results and orders. Noted no blood thinner ordered. PCCM called discussed course of care, informed to call Dr. Sharyn Lull (who ordered scan).  2232: Called Dr. Sharyn Lull, discussed pt case. Informed by MD to call Dr. Shelle Iron (whoever is on call for MD) and ask for their assistance because they had better insight on how the bronchoscopy went before ordering heparin. Called [phone]: Dr. Herma Carson, informed of Dr. Sharyn Lull information. Dr. Herma Carson stated he would call MD to clarify. Nurse awaiting orders

## 2012-07-04 NOTE — Progress Notes (Signed)
Subjective:  Patient this any chest pain states his breathing has improved feels much better today  Objective:  Vital Signs in the last 24 hours: Temp:  [97.4 F (36.3 C)-98.2 F (36.8 C)] 98.1 F (36.7 C) (06/05 0837) Pulse Rate:  [60-81] 74 (06/05 0837) Resp:  [16-27] 19 (06/05 0837) BP: (96-120)/(48-66) 109/59 mmHg (06/05 0837) SpO2:  [88 %-100 %] 95 % (06/05 0837)  Intake/Output from previous day: 06/04 0701 - 06/05 0700 In: 250 [P.O.:250] Out: 2250 [Urine:2250] Intake/Output from this shift: Total I/O In: -  Out: 300 [Urine:300]  Physical Exam: Neck: no adenopathy, no carotid bruit, no JVD and supple, symmetrical, trachea midline Lungs: Decreased breath sound at the left base with minimal fine crackles Heart: regular rate and rhythm, S1, S2 normal, no murmur, click, rub or gallop Abdomen: soft, non-tender; bowel sounds normal; no masses,  no organomegaly Extremities: No clubbing cyanosis 2+ edema right leg and chronically edematous left leg  Lab Results:  Recent Labs  07/02/12 1355  WBC 3.2*  HGB 11.3*  PLT 210    Recent Labs  07/02/12 0350 07/02/12 1355 07/03/12 0415  NA 138  --  138  K 3.9  --  4.0  CL 99  --  102  CO2 30  --  29  GLUCOSE 101*  --  94  BUN 21  --  16  CREATININE 1.23 1.14 1.10   No results found for this basename: TROPONINI, CK, MB,  in the last 72 hours Hepatic Function Panel No results found for this basename: PROT, ALBUMIN, AST, ALT, ALKPHOS, BILITOT, BILIDIR, IBILI,  in the last 72 hours No results found for this basename: CHOL,  in the last 72 hours No results found for this basename: PROTIME,  in the last 72 hours  Imaging: Imaging results have been reviewed and Nm Pulmonary Perf And Vent  07/03/2012   *RADIOLOGY REPORT*  Clinical Data: Rule out venous thromboembolic disease  NM PULMONARY VENTILATION AND PERFUSION SCAN  Radiopharmaceutical: CURIE MAA TECHNETIUM TO 45M ALBUMIN AGGREGATED  Comparison: Chest x-ray dated  07/03/2012.  Findings: On the chest radiograph from today there is persistent decreased aeration to the left lower lobe.  On the ventilation portion of the examination there is a large area of decreased ventilation to the left lung base.  On the perfusion portion of the examination there is asymmetric decreased perfusion to the left base which corresponds to the ventilation abnormality and chest radiograph abnormality.  Normal perfusion to the right lung is noted.  IMPRESSION:  1.  Triple match defect within the left lower lobe compatible with intermediate probability for acute pulmonary embolus.   Original Report Authenticated By: Signa Kell, M.D.   Dg Chest Port 1 View  07/03/2012   *RADIOLOGY REPORT*  Clinical Data: Status post bronchoscopy  PORTABLE CHEST - 1 VIEW  Comparison: Chest radiograph from 06/29/2012  Findings: Heart size is normal.  Asymmetric opacification within the left midlung and left base is unchanged from previous exam.  No pneumothorax after bronchoscopy identified.  IMPRESSION:  1.  No change in aeration to the left midlung and left base.   Original Report Authenticated By: Signa Kell, M.D.   Dg C-arm Bronchoscopy  07/03/2012   CLINICAL DATA: post bronchscopy   C-ARM BRONCHOSCOPY  Fluoroscopy was utilized by the requesting physician.  No radiographic  interpretation.     Cardiac Studies:  Assessment/Plan:  compensated acute congestive heart failure secondary to diastolic dysfunction/systolic dysfunction precipitated by volume overload and recent  decreased Lasix dose.  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 Okay to discharge him cardiac point of view Agree with sleep studies as outpatient Check bronc results Discuss with patient regarding lifestyle modification diet and reducing weight  LOS: 6 days    Zykee Avakian N 07/04/2012, 10:00 AM

## 2012-07-04 NOTE — Progress Notes (Signed)
Subjective: Stable at rest on low flow oxygen, he feels his breathing is much improved since admit, and is walking around room without oxygen and little dyspnea.  V/Q, bronch, RHC reviewed with him.  Objective: Vital signs in last 24 hours: Blood pressure 109/59, pulse 74, temperature 98.1 F (36.7 C), temperature source Oral, resp. rate 19, height 5\' 11"  (1.803 m), weight 154.8 kg (341 lb 4.4 oz), SpO2 95.00%.  Intake/Output from previous day: 06/04 0701 - 06/05 0700 In: 250 [P.O.:250] Out: 2250 [Urine:2250]   Physical Exam:   morbidly obese male in nad Nose without purulence or discharge noted Neck without LN or TMG Chest with minimal crackles, left worse than right.  No wheezing Cor with rrr, distant, no murmur LE with 3+ edema, no cyanosis noted. Alert and oriented, moves all 4.    Lab Results:  Recent Labs  07/02/12 1355  WBC 3.2*  HGB 11.3*  HCT 34.0*  PLT 210   BMET  Recent Labs  07/02/12 0350 07/02/12 1355 07/03/12 0415  NA 138  --  138  K 3.9  --  4.0  CL 99  --  102  CO2 30  --  29  GLUCOSE 101*  --  94  BUN 21  --  16  CREATININE 1.23 1.14 1.10  CALCIUM 9.3  --  9.1    Studies/Results: Nm Pulmonary Perf And Vent  07/03/2012   *RADIOLOGY REPORT*  Clinical Data: Rule out venous thromboembolic disease  NM PULMONARY VENTILATION AND PERFUSION SCAN  Radiopharmaceutical: CURIE MAA TECHNETIUM TO 79M ALBUMIN AGGREGATED  Comparison: Chest x-ray dated 07/03/2012.  Findings: On the chest radiograph from today there is persistent decreased aeration to the left lower lobe.  On the ventilation portion of the examination there is a large area of decreased ventilation to the left lung base.  On the perfusion portion of the examination there is asymmetric decreased perfusion to the left base which corresponds to the ventilation abnormality and chest radiograph abnormality.  Normal perfusion to the right lung is noted.  IMPRESSION:  1.  Triple match defect within  the left lower lobe compatible with intermediate probability for acute pulmonary embolus.   Original Report Authenticated By: Signa Kell, M.D.   Dg Chest Port 1 View  07/03/2012   *RADIOLOGY REPORT*  Clinical Data: Status post bronchoscopy  PORTABLE CHEST - 1 VIEW  Comparison: Chest radiograph from 06/29/2012  Findings: Heart size is normal.  Asymmetric opacification within the left midlung and left base is unchanged from previous exam.  No pneumothorax after bronchoscopy identified.  IMPRESSION:  1.  No change in aeration to the left midlung and left base.   Original Report Authenticated By: Signa Kell, M.D.   Dg C-arm Bronchoscopy  07/03/2012   CLINICAL DATA: post bronchscopy   C-ARM BRONCHOSCOPY  Fluoroscopy was utilized by the requesting physician.  No radiographic  interpretation.     Assessment/Plan:  1) bilateral pulmonary infiltrates with acute hypoxic respiratory failure.   The pt's RHC showed normal left sided pressures, and only mild to moderate pulm htn.  No shunting noted on sat run.  His bronch showed normal airways and no purulence.  Await bx results to r/o recurrence of his cancer.  He is clearly breathing better after diuresis, and we should continue.  However, I do not think this is the cause of his infiltrates.  His nuclear testing did show depressed EF.  He has also had a V/Q to r/o chronic TE disease, and there  is no evidence for this.  He had a matched defect at his left base that corresponds to radiographic abnormality.  He had a negative ct angio 3 weeks ago for acute PE.  At this point, I think we have accomplished all we can from the hospitalization.  Will tx to floor, ambulate, and send home on oxygen and diuretics in am.  Hopefully path will be back tomorrow, although may be Monday.  He will also need outpt sleep study.   -continue oxygen, OOB - followup bx results. -would not anticoagulate since a classic matched defect, and negative ct angio 3 weeks ago for acute PE.   The scan is not c/w chronic TE disease.  -?d/c home in am -needs outpt sleep study  2) diastolic dysfunction/depressed EF on nuclear study His left sided pressures were normal at RHC, but he had also been diuresed.  We have not done any other intervention since he has been here except diurese, and he states that he is clearly better.  Will need to continue this as outpt.  Will leave to cardiology any other management of his depressed EF   Barbaraann Share, M.D. 07/04/2012, 8:42 AM

## 2012-07-04 NOTE — Plan of Care (Signed)
Problem: Phase II Progression Outcomes Goal: Progress activity as tolerated unless otherwise ordered Outcome: Progressing Ambulating in room, will progress toward ambulation in hall monitoring O2 saturations

## 2012-07-04 NOTE — Progress Notes (Signed)
Patient OOB and ambulating in hallway of dept 2600 for 5 min.  Patient steady on feet remains on Room Air.  Patient became short of breath after 4 minutes of walking in hall.  Upon return to the chair, O2 saturations decreased to 69% and he rapidly compensated back to 96% after 2L [place] was applied. Will continue to monitor and ambulate this evening.

## 2012-07-04 NOTE — Progress Notes (Signed)
O2 saturations decreased to 69% on Room air, after walking 4 min.  2L [place] applied

## 2012-07-05 ENCOUNTER — Telehealth: Payer: Self-pay | Admitting: Pulmonary Disease

## 2012-07-05 ENCOUNTER — Other Ambulatory Visit: Payer: Self-pay | Admitting: Pulmonary Disease

## 2012-07-05 ENCOUNTER — Encounter (HOSPITAL_COMMUNITY): Payer: Self-pay | Admitting: Pulmonary Disease

## 2012-07-05 DIAGNOSIS — G4733 Obstructive sleep apnea (adult) (pediatric): Secondary | ICD-10-CM

## 2012-07-05 LAB — BASIC METABOLIC PANEL
Calcium: 9.4 mg/dL (ref 8.4–10.5)
Creatinine, Ser: 1.27 mg/dL (ref 0.50–1.35)
GFR calc Af Amer: 66 mL/min — ABNORMAL LOW (ref >90)
Sodium: 137 mEq/L (ref 135–145)

## 2012-07-05 LAB — CULTURE, BAL-QUANTITATIVE W GRAM STAIN

## 2012-07-05 MED ORDER — POTASSIUM CHLORIDE CRYS ER 20 MEQ PO TBCR: 40.0000 meq | EXTENDED_RELEASE_TABLET | Freq: Every day | ORAL | Status: DC

## 2012-07-05 MED ORDER — SPIRONOLACTONE 25 MG PO TABS: 25.0000 mg | ORAL_TABLET | Freq: Every day | ORAL | Status: DC

## 2012-07-05 MED ORDER — FUROSEMIDE 80 MG PO TABS: 80.0000 mg | ORAL_TABLET | Freq: Every day | ORAL | Status: DC

## 2012-07-05 MED ORDER — AMBULATORY NON FORMULARY MEDICATION: Status: DC

## 2012-07-05 MED ORDER — RAMIPRIL 2.5 MG PO CAPS: 2.5000 mg | ORAL_CAPSULE | Freq: Every day | ORAL | Status: DC

## 2012-07-05 NOTE — Progress Notes (Signed)
Subjective:  Patient denies any chest pain complaints of shortness of breath with minimal exertion states overall feels better her O2 sats remains good on room air but desats with minimal exertion but maintains about 88% on 2 L  Objective:  Vital Signs in the last 24 hours: Temp:  [97.3 F (36.3 C)-98.5 F (36.9 C)] 97.3 F (36.3 C) (06/06 1200) Pulse Rate:  [55-79] 66 (06/06 1200) Resp:  [14-32] 20 (06/06 1200) BP: (95-118)/(39-89) 96/69 mmHg (06/06 1200) SpO2:  [88 %-100 %] 98 % (06/06 1200)  Intake/Output from previous day: 06/05 0701 - 06/06 0700 In: 450 [P.O.:450] Out: 2147 [Urine:2145; Stool:2] Intake/Output from this shift: Total I/O In: 360 [P.O.:360] Out: 725 [Urine:725]  Physical Exam: Neck: no adenopathy, no carotid bruit, no JVD and supple, symmetrical, trachea midline Lungs: Decreased breath sound at left base air entry improved Heart: regular rate and rhythm, S1, S2 normal, no murmur, click, rub or gallop Abdomen: soft, non-tender; bowel sounds normal; no masses,  no organomegaly Extremities: No clubbing cyanosis 1+ edema right leg and 3+ edema left leg  Lab Results:  Recent Labs  07/02/12 1355  WBC 3.2*  HGB 11.3*  PLT 210    Recent Labs  07/03/12 0415 07/05/12 0450  NA 138 137  K 4.0 3.9  CL 102 98  CO2 29 30  GLUCOSE 94 102*  BUN 16 20  CREATININE 1.10 1.27   No results found for this basename: TROPONINI, CK, MB,  in the last 72 hours Hepatic Function Panel No results found for this basename: PROT, ALBUMIN, AST, ALT, ALKPHOS, BILITOT, BILIDIR, IBILI,  in the last 72 hours No results found for this basename: CHOL,  in the last 72 hours No results found for this basename: PROTIME,  in the last 72 hours  Imaging: Imaging results have been reviewed and Nm Pulmonary Perf And Vent  07/03/2012   *RADIOLOGY REPORT*  Clinical Data: Rule out venous thromboembolic disease  NM PULMONARY VENTILATION AND PERFUSION SCAN  Radiopharmaceutical: CURIE  MAA TECHNETIUM TO 79M ALBUMIN AGGREGATED  Comparison: Chest x-ray dated 07/03/2012.  Findings: On the chest radiograph from today there is persistent decreased aeration to the left lower lobe.  On the ventilation portion of the examination there is a large area of decreased ventilation to the left lung base.  On the perfusion portion of the examination there is asymmetric decreased perfusion to the left base which corresponds to the ventilation abnormality and chest radiograph abnormality.  Normal perfusion to the right lung is noted.  IMPRESSION:  1.  Triple match defect within the left lower lobe compatible with intermediate probability for acute pulmonary embolus.   Original Report Authenticated By: Signa Kell, M.D.    Cardiac Studies:  Assessment/Plan:  compensated acute congestive heart failure secondary to diastolic dysfunction/systolic dysfunction precipitated by volume overload and recent decreased Lasix dose.  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

## 2012-07-05 NOTE — Telephone Encounter (Signed)
Synetta Fail from [employer] states the form faxed earlier is incomplete & can not be used.  Re-faxing correct form to [phone].  [name]

## 2012-07-05 NOTE — Telephone Encounter (Signed)
Will forward message to [facility] then. Please advise thanks

## 2012-07-05 NOTE — Telephone Encounter (Signed)
Per TD disregard message O2 ordered through [facility]. Will sign off

## 2012-07-05 NOTE — Progress Notes (Signed)
Subjective: Doing well today, with no resting increased wob.  desat yest while walking on ra to 69%, but in 90's on 2 liters.  Does not require oxygen at rest. Denies cough or congestion.  Objective: Vital signs in last 24 hours: Blood pressure 105/89, pulse 66, temperature 97.9 F (36.6 C), temperature source Oral, resp. rate 18, height 5\' 11"  (1.803 m), weight 154.8 kg (341 lb 4.4 oz), SpO2 97.00%.  Intake/Output from previous day: 06/05 0701 - 06/06 0700 In: 450 [P.O.:450] Out: 2147 [Urine:2145; Stool:2]   Physical Exam:   morbidly obese male in nad Nose without purulence or discharge noted Neck without LN or TMG Chest with minimal crackles, left worse than right.  No wheezing Cor with rrr, distant, no murmur LE with 3+ edema, no cyanosis noted. Alert and oriented, moves all 4.    Lab Results:  Recent Labs  07/02/12 1355  WBC 3.2*  HGB 11.3*  HCT 34.0*  PLT 210   BMET  Recent Labs  07/02/12 1355 07/03/12 0415 07/05/12 0450  NA  --  138 137  K  --  4.0 3.9  CL  --  102 98  CO2  --  29 30  GLUCOSE  --  94 102*  BUN  --  16 20  CREATININE 1.14 1.10 1.27  CALCIUM  --  9.1 9.4    Studies/Results: Nm Pulmonary Perf And Vent  07/03/2012   *RADIOLOGY REPORT*  Clinical Data: Rule out venous thromboembolic disease  NM PULMONARY VENTILATION AND PERFUSION SCAN  Radiopharmaceutical: CURIE MAA TECHNETIUM TO 3M ALBUMIN AGGREGATED  Comparison: Chest x-ray dated 07/03/2012.  Findings: On the chest radiograph from today there is persistent decreased aeration to the left lower lobe.  On the ventilation portion of the examination there is a large area of decreased ventilation to the left lung base.  On the perfusion portion of the examination there is asymmetric decreased perfusion to the left base which corresponds to the ventilation abnormality and chest radiograph abnormality.  Normal perfusion to the right lung is noted.  IMPRESSION:  1.  Triple match defect within the  left lower lobe compatible with intermediate probability for acute pulmonary embolus.   Original Report Authenticated By: Signa Kell, M.D.   Dg Chest Port 1 View  07/03/2012   *RADIOLOGY REPORT*  Clinical Data: Status post bronchoscopy  PORTABLE CHEST - 1 VIEW  Comparison: Chest radiograph from 06/29/2012  Findings: Heart size is normal.  Asymmetric opacification within the left midlung and left base is unchanged from previous exam.  No pneumothorax after bronchoscopy identified.  IMPRESSION:  1.  No change in aeration to the left midlung and left base.   Original Report Authenticated By: Signa Kell, M.D.   Dg C-arm Bronchoscopy  07/03/2012   CLINICAL DATA: post bronchscopy   C-ARM BRONCHOSCOPY  Fluoroscopy was utilized by the requesting physician.  No radiographic  interpretation.     Assessment/Plan:  1) bilateral pulmonary infiltrates with acute hypoxic respiratory failure.   The pt's RHC showed normal left sided pressures, and only mild to moderate pulm htn.  No shunting noted on sat run.  His bronch showed normal airways and no purulence.  Await bx results to r/o recurrence of his cancer.  He is clearly breathing better after diuresis, and we should continue.  However, I do not think this is the cause of his infiltrates.  His nuclear testing did show depressed EF.  He has also had a V/Q to r/o chronic TE disease,  and there is no evidence for this.  He had a matched defect at his left base that corresponds to radiographic abnormality.  He had a negative ct angio 3 weeks ago for acute PE.  At this point, I think we have accomplished all we can from the hospitalization.    -d/c home on current meds except for albuterol (does not need).  Compare with admission meds as well.  - followup bx results as outpt -needs outpt sleep study.  I will schedule and let pt know -Oxygen at 2 lpm with sleep and exertion.  Does not need at rest if awake.   2) diastolic dysfunction/depressed EF on nuclear  study His left sided pressures were normal at RHC, but he had also been diuresed.  We have not done any other intervention since he has been here except diurese, and he states that he is clearly better.  Will need to continue this as outpt.  Will leave to cardiology any other management of his depressed EF. -f/u with cardiology   Barbaraann Share, M.D. 07/05/2012, 8:15 AM

## 2012-07-05 NOTE — Discharge Summary (Signed)
Physician Discharge Summary  Patient ID: [name] MRN: [id number] DOB/AGE: 67-Nov-1947 67 y.o.  Admit date: 06/28/2012 Discharge date: 07/05/2012    Discharge Diagnoses:  Bilateral Pulmonary Infiltrates Acute Hypoxic Respiratory Failure Presumed OSA Former Tobacco Abuse Decompensated Diastolic CHF Morbid Obesity Hx of Adenocarcinoma L Lung Pulmonary HTN   Hospital Summary: [name] is a 67 y.o. 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  PFT 10/2011 w/ showed restrictive changes w/ no obstruction FEV1 2.27 (72%), ratio 80, TLC 64%, DLCO 44% pred. Required recent admission to [facility] on 06/12/12 with worsening LUL opacity which was treated with IV rocephin / azithro and diuresis.  Re-admitted on 5/24 with worsening dyspnea and found to have renal insufficiency, persistent LUL airspace diease and hypoxemia with exertion.  Discharged on diuresis and steroids.  On 5/30 for hospital follow up in Pulmonary Office he was noted to have hypoxemia with sats of 69% on room air and CXR that demonstrated interval development of interstitial pulmonary edema.  Readmitted for diuresis / medical management.  Dr. Sharyn Lull was consulted in the setting of acute decompensated diastolic dysfunction.  RHC showed normal left sided pressures and only mild to mod pulmonary HTN.  He underwent bronchoscopy to evaluate for re-occurence of CA on 6/4 in the setting of bilateral pulmonary infiltrates with normal airway exam & negative cultures to date and pending cytology.  Previous CT negative for PE and also V/Q scan performed this admit to rule out chronic thromboembolic disease (negative).  [name] responded well to diuresis and feels clinically improved at time of discharge.            DISCHARGE INSTRUCTIONS BY DIAGNOSIS     Bilateral pulmonary infiltrates with acute hypoxic  respiratory failure.  The pt's RHC showed normal left sided pressures, and only mild to moderate pulm htn. No shunting noted. His bronch showed normal airways and no purulence. Await bx results to r/o recurrence of his cancer. He is clearly breathing better after diuresis. However, do not think this is the cause of his infiltrates. His nuclear testing did show depressed EF. He has also had a V/Q to r/o chronic TE disease, and there is no evidence for this. He had a matched defect at his left base that corresponds to radiographic abnormality. He had a negative ct angio 3 weeks ago for acute PE.    Discharge Instructions: - d/c albuterol (does not need) - followup cytology from bronch as outpt  - will need outpt sleep study  - Oxygen at 2 lpm with sleep and exertion. Does not need at rest if awake.   Diastolic dysfunction / depressed EF on nuclear study  His left sided pressures were normal at RHC, but he had also been diuresed. We have not done any other intervention except diurese, and he states that he is clearly better.   Discharge Instructions: - f/u with cardiology within one week with BMP to review renal fxn - continue lasix, potassium - continue aldactone & ACE-I (new this admit)    CONSULTS Cardiology - Dr. Sharyn Lull    MICRO DATA  5/30 MRSA PCR>>>neg 6/4 BAL>>>greater 100k non-pathogenic oral flora 6/4 AFB / Fungal BAL>>>negative prelim>>>   SIGNIFICANT DIAGNOSTIC STUDIES 5/15 -  ECHO>>EF 50%, nml systolic fxn, grade 1 diastolic dysfunction   Discharge Exam: General: morbidly obese male in NAD Neuro: AAOx4, MAE CV: RRR, no m/r/g, distant  tones PULM: resp's even/non-labored, lungs with minimal crackles L>R.  No wheeze GI: abd round /soft, bsx4 active Extremities: LE 3+ edema, no cyanosis  Filed Vitals:   07/05/12 0411 07/05/12 0800 07/05/12 0947 07/05/12 1200  BP: 105/89 116/61 107/54 96/69  Pulse: 66 65 79 66  Temp: 97.9 F (36.6 C) 98.5 F (36.9 C)  97.3 F  (36.3 C)  TempSrc: Oral Oral  Oral  Resp: 18 17  20   Height:      Weight:      SpO2: 97% 95%  98%   Filed Weights   06/30/12 0430 07/01/12 0558 07/03/12 0419  Weight: 341 lb 11.4 oz (155 kg) 338 lb 13.6 oz (153.7 kg) 341 lb 4.4 oz (154.8 kg)     Discharge Labs  BMET  Recent Labs Lab 06/29/12 0517 06/30/12 0348 07/02/12 0350 07/02/12 1355 07/03/12 0415 07/05/12 0450  NA 139 136 138  --  138 137  K 4.2 3.8 3.9  --  4.0 3.9  CL 101 98 99  --  102 98  CO2 28 29 30   --  29 30  GLUCOSE 98 92 101*  --  94 102*  BUN 22 23 21   --  16 20  CREATININE 1.19 1.25 1.23 1.14 1.10 1.27  CALCIUM 9.2 9.5 9.3  --  9.1 9.4   CBC  Recent Labs Lab 06/29/12 0517 07/02/12 1355  HGB 11.1* 11.3*  HCT 33.8* 34.0*  WBC 4.7 3.2*  PLT 226 210   Anti-Coagulation  Recent Labs Lab 07/02/12 0522  INR 1.04     Discharge Orders   Future Appointments Provider Department Dept Phone   07/12/2012 9:00 AM Barbaraann Share, MD [facility] [phone]   08/27/2012 9:45 AM [facility] Only [facility] [phone]   08/27/2012 10:30 AM [facility] 2 [facility] [phone]   Patient to arrive 15 minutes prior to appointment time. No solid food 4 hours prior to exam. Liquids and Medicines are okay.   08/28/2012 10:30 AM Si Gaul, MD [facility] [phone]   Future Orders Complete By Expires     (HEART FAILURE PATIENTS) Call MD:  Anytime you have any of the following symptoms: 1) 3 pound weight gain in 24 hours or 5 pounds in 1 week 2) shortness of breath, with or without a dry hacking cough 3) swelling in the hands, feet or stomach 4) if you have to sleep on extra pillows at night in order to breathe.  As directed     Call MD for:  difficulty breathing, headache or visual disturbances  As directed     Call MD for:  persistant dizziness or light-headedness  As directed     Call MD for:   temperature >100.4  As directed     Diet - low sodium heart healthy  As directed     Discharge instructions  As directed     Scheduling Instructions:      1. Oxygen at 2 L with sleep and exertion.  Does not need at rest if awake. 2. Dr. Teddy Spike office will call you regarding a sleep study.  3. Weigh yourself daily and record.  Take with you to see Dr. Sharyn Lull    Increase activity slowly  As directed             Follow-up Information   Follow up with Barbaraann Share, MD On 07/12/2012. (Appt at 9:00 AM)  Contact information:   [address] [place] [address] [phone]       Follow up with Robynn Pane, MD. Schedule an appointment as soon as possible for a visit on 07/09/2012. (Appt at 3:30)    Contact information:   [address] [place] [address] [phone]          Medication List    STOP taking these medications       albuterol 108 (90 BASE) MCG/ACT inhaler  Commonly known as:  PROVENTIL HFA;VENTOLIN HFA     predniSONE 10 MG tablet  Commonly known as:  DELTASONE      TAKE these medications       AMBULATORY NON FORMULARY MEDICATION  BMP to be collected 6/10 at [facility] and sent to Dr. Annitta Jersey office for Review     aspirin EC 81 MG tablet  Take 81 mg by mouth daily.     atorvastatin 40 MG tablet  Commonly known as:  LIPITOR  Take 40 mg by mouth daily.     carvedilol 3.125 MG tablet  Commonly known as:  COREG  Take 3.125 mg by mouth 2 (two) times daily with a meal.     folic acid 1 MG tablet  Commonly known as:  FOLVITE  Take 1 tablet (1 mg total) by mouth daily.     furosemide 80 MG tablet  Commonly known as:  LASIX  Take 1 tablet (80 mg total) by mouth daily.     potassium chloride SA 20 MEQ tablet  Commonly known as:  K-DUR,KLOR-CON  Take 2 tablets (40 mEq total) by mouth daily.     ramipril 2.5 MG capsule  Commonly known as:  ALTACE  Take 1 capsule (2.5 mg total) by mouth daily.     rosuvastatin 10 MG  tablet  Commonly known as:  CRESTOR  Take 10 mg by mouth daily.     spironolactone 25 MG tablet  Commonly known as:  ALDACTONE  Take 1 tablet (25 mg total) by mouth daily.          Disposition: Discharge to Home with oxygen at 2L with exertion and QHS.    Discharged Condition: [name] has met maximum benefit of inpatient care and is medically stable and cleared for discharge.  Patient is pending follow up as above.      Time spent on disposition:  Greater than 35 minutes.   Signed: Canary Brim, NP-C [facility] Pgr: [phone]

## 2012-07-05 NOTE — Telephone Encounter (Signed)
I will forward as an FYI. Lashawn Bromwell, CMA  

## 2012-07-05 NOTE — Discharge Summary (Signed)
Pt seen and examined.  Agree with note as above.  See my full note from earlier.

## 2012-07-05 NOTE — Telephone Encounter (Signed)
Per [name]-only do the typed one & send back ASAP.  [name] has form for [facility] to sign.  [name]

## 2012-07-05 NOTE — Telephone Encounter (Signed)
i checked up front and these forms have not been received yet.  Will check back later.

## 2012-07-05 NOTE — Care Management Note (Signed)
    Page 1 of 2   07/05/2012     11:07:40 AM   CARE MANAGEMENT NOTE 07/05/2012  Patient:  [name], [name]   Account Number:  [id number]  Date Initiated:  06/28/2012  Documentation initiated by:  Alvira Philips Assessment:   67 yr-old male adm with dx of acute resp failure, lives with son and dtr-in-law, had home O2 through [employer]     Action/Plan:   Anticipated DC Date:  07/05/2012   Anticipated DC Plan:  HOME/SELF CARE      DC Planning Services  CM consult      PAC Choice  DURABLE MEDICAL EQUIPMENT   Choice offered to / List presented to:  C-1 Patient   DME arranged  OXYGEN      DME agency  [facility]     [facility] arranged  HH - 11 Patient Refused      Status of service:   Medicare Important Message given?   (If response is "NO", the following Medicare IM given date fields will be blank) Date Medicare IM given:   Date Additional Medicare IM given:    Discharge Disposition:    Per UR Regulation:  Reviewed for med. necessity/level of care/duration of stay  If discussed at Long Length of Stay Meetings, dates discussed:   07/04/2012    Comments:  6/6 1059 debbie Ulys Favia rn,bsn spoke w pt about home o2. hx w [facility] and wants to price o2- we called [facility]. all were about 46.00 per month except [facility] who quoted 42.00 per month and have fin assist program which they faxed form and i gave to pt to fill out and send in w proof of income to see if he can get assist w medicare copay of 42.oo per month. port o2 to room and conc to home. [facility] was faxed orders. pt does not want [facility]. his son and sisters ck on him.

## 2012-07-05 NOTE — Telephone Encounter (Signed)
Synetta Fail from [facility] states that they have not received the form back yet (that she faxed just after speaking to nurse at 12:44pm today). They need this BEFORE 5pm as this is when they close!! Thanks. Fax # [phone]. Anita's contact # is [phone]. Hazel Sams

## 2012-07-09 ENCOUNTER — Telehealth: Payer: Self-pay | Admitting: Pulmonary Disease

## 2012-07-09 NOTE — Telephone Encounter (Signed)
Pt has appt with [facility] on June 11th

## 2012-07-10 ENCOUNTER — Encounter: Payer: Self-pay | Admitting: Pulmonary Disease

## 2012-07-10 ENCOUNTER — Ambulatory Visit (INDEPENDENT_AMBULATORY_CARE_PROVIDER_SITE_OTHER): Payer: Medicare Other | Admitting: Pulmonary Disease

## 2012-07-10 VITALS — BP 108/60 | HR 71 | Temp 97.7°F | Ht 70.25 in | Wt 340.2 lb

## 2012-07-10 DIAGNOSIS — J984 Other disorders of lung: Secondary | ICD-10-CM

## 2012-07-10 DIAGNOSIS — I5032 Chronic diastolic (congestive) heart failure: Secondary | ICD-10-CM | POA: Insufficient documentation

## 2012-07-10 DIAGNOSIS — J9610 Chronic respiratory failure, unspecified whether with hypoxia or hypercapnia: Secondary | ICD-10-CM

## 2012-07-10 DIAGNOSIS — C3490 Malignant neoplasm of unspecified part of unspecified bronchus or lung: Secondary | ICD-10-CM

## 2012-07-10 DIAGNOSIS — E669 Obesity, unspecified: Secondary | ICD-10-CM

## 2012-07-10 NOTE — Progress Notes (Signed)
  Subjective:    Patient ID: [name], male    DOB: 1945-04-08, 67 y.o.   MRN: [id number]  HPI The patient comes in today for followup after his recent hospitalization for acute respiratory failure.  He had adequate saturations on room air, but had severe desaturation with exertion.  His chest x-ray showed a persistent infiltrate in his left lower lung zone, as well as the suggestion of pulmonary venous congestion.  The patient was not felt to have an infection, but the differential was a recurrence of his cancer in the left lung versus congestive heart failure.  He was aggressively diuresed, and in fact is down 14 pounds from his last visit here.  He did have significant improvement in his breathing during the hospitalization, but continued to require oxygen with exertion.  He underwent right heart catheterization which did not show an elevated wedge pressure, but he had been diuresed.  The patient was found to have pulmonary hypertension, and this is felt to be secondary to his morbid obesity with probable sleep apnea, as well as chronic hypoxemia.  He underwent a ventilation perfusion scan which showed only a matched defect in his left lower lobe.  The patient underwent bronchoscopy to rule out recurrence of his cancer, and there was no evidence for infection.  His transbronchial lung biopsies did return positive for adenocarcinoma.  The patient feels that he is doing adequately since discharge, but does not feel the oxygen helps his dyspnea on exertion.  Unfortunately he left this in the car and is not wearing it.  I have stressed to him the importance of wearing any time he leaves the house.  He also is scheduled for a sleep study the end of the month.   Review of Systems  Constitutional: Negative for fever and unexpected weight change.  HENT: Negative for ear pain, nosebleeds, congestion, sore throat, rhinorrhea, sneezing, trouble swallowing, dental problem, postnasal drip and sinus pressure.    Eyes: Negative for redness and itching.  Respiratory: Positive for shortness of breath. Negative for cough, chest tightness and wheezing.   Cardiovascular: Positive for leg swelling. Negative for palpitations.  Gastrointestinal: Negative for nausea and vomiting.  Genitourinary: Negative for dysuria.  Musculoskeletal: Negative for joint swelling.  Skin: Negative for rash.  Neurological: Negative for headaches.  Hematological: Does not bruise/bleed easily.  Psychiatric/Behavioral: Negative for dysphoric mood. The patient is not nervous/anxious.        Objective:   Physical Exam Moderately obese male in no acute distress Nose without purulence or discharge noted Neck without lymphadenopathy or thyromegaly Chest with decreased breath sounds in both bases as well as crackles, no wheezing. Cardiac exam with regular rate and rhythm Lower extremities with 3+ edema, no cyanosis Alert and oriented, moves all 4 extremities.       Assessment & Plan:

## 2012-07-10 NOTE — Assessment & Plan Note (Signed)
The patient's biopsy from his left upper lobe was positive for adenocarcinoma.  He has already had a left lower lobectomy, and given his current state I do not think he is a candidate for further surgery.  I would like to present him at cancer conference to discuss further treatment.

## 2012-07-10 NOTE — Patient Instructions (Addendum)
Will discuss your xrays and biopsy results at cancer conference in the am.  I will call you to discuss. Stay on your oxygen any time you leave the house.  Will see if we can get you a more portable source.  Keep apptm for your sleep study.  Will call once I get the results. Stay on your fluid pills, and keep an eye on your swelling. Work on weight loss.

## 2012-07-10 NOTE — Assessment & Plan Note (Signed)
The patient has responded well to diuresis while in the hospital, and in fact he is 15 pounds lighter than the day of admission.  He is to continue on his current diuretic therapy under the direction of his cardiologist.

## 2012-07-11 ENCOUNTER — Telehealth: Payer: Self-pay | Admitting: Pulmonary Disease

## 2012-07-11 NOTE — Telephone Encounter (Signed)
This has been documented in the patient care coordinator note.

## 2012-07-12 ENCOUNTER — Inpatient Hospital Stay: Payer: Medicare Other | Admitting: Pulmonary Disease

## 2012-07-12 ENCOUNTER — Telehealth: Payer: Self-pay | Admitting: Internal Medicine

## 2012-07-12 ENCOUNTER — Other Ambulatory Visit: Payer: Self-pay | Admitting: *Deleted

## 2012-07-12 ENCOUNTER — Telehealth: Payer: Self-pay | Admitting: *Deleted

## 2012-07-12 NOTE — Telephone Encounter (Signed)
r/s est per 6.13.14 pof....per pof pt aware

## 2012-07-12 NOTE — Telephone Encounter (Signed)
Spoke with [name] regarding appt for 07/24/12 at 9:15 labs/and 9:45 Dr. Arbutus Ped.  He verbalized understanding of time and place of appt

## 2012-07-16 ENCOUNTER — Telehealth: Payer: Self-pay | Admitting: Pulmonary Disease

## 2012-07-16 NOTE — Telephone Encounter (Signed)
I spoke with the pt and he is aware that we need to bring him in to do a walk test to qualify him for portable oxygen. Appt set for tomorrow, pt states he will be here at 10am. Ashtyn is aware as well. Carron Curie, CMA

## 2012-07-17 ENCOUNTER — Inpatient Hospital Stay: Payer: Medicare Other | Admitting: Pulmonary Disease

## 2012-07-17 ENCOUNTER — Ambulatory Visit (INDEPENDENT_AMBULATORY_CARE_PROVIDER_SITE_OTHER): Payer: Medicare Other

## 2012-07-17 DIAGNOSIS — E669 Obesity, unspecified: Secondary | ICD-10-CM

## 2012-07-17 DIAGNOSIS — J984 Other disorders of lung: Secondary | ICD-10-CM

## 2012-07-24 ENCOUNTER — Telehealth: Payer: Self-pay | Admitting: Internal Medicine

## 2012-07-24 ENCOUNTER — Encounter: Payer: Self-pay | Admitting: Internal Medicine

## 2012-07-24 ENCOUNTER — Ambulatory Visit (HOSPITAL_BASED_OUTPATIENT_CLINIC_OR_DEPARTMENT_OTHER): Payer: Medicare Other | Admitting: Internal Medicine

## 2012-07-24 ENCOUNTER — Other Ambulatory Visit (HOSPITAL_BASED_OUTPATIENT_CLINIC_OR_DEPARTMENT_OTHER): Payer: Medicare Other | Admitting: Lab

## 2012-07-24 DIAGNOSIS — C3490 Malignant neoplasm of unspecified part of unspecified bronchus or lung: Secondary | ICD-10-CM

## 2012-07-24 LAB — CBC WITH DIFFERENTIAL/PLATELET
Basophils Absolute: 0 10*3/uL (ref 0.0–0.1)
Eosinophils Absolute: 0.1 10*3/uL (ref 0.0–0.5)
HGB: 11.4 g/dL — ABNORMAL LOW (ref 13.0–17.1)
MCV: 96.5 fL (ref 79.3–98.0)
MONO#: 0.5 10*3/uL (ref 0.1–0.9)
MONO%: 11.9 % (ref 0.0–14.0)
NEUT#: 2.9 10*3/uL (ref 1.5–6.5)
Platelets: 247 10*3/uL (ref 140–400)
RDW: 14.3 % (ref 11.0–14.6)

## 2012-07-24 LAB — COMPREHENSIVE METABOLIC PANEL (CC13)
Albumin: 3.2 g/dL — ABNORMAL LOW (ref 3.5–5.0)
Alkaline Phosphatase: 73 U/L (ref 40–150)
BUN: 18.5 mg/dL (ref 7.0–26.0)
Glucose: 126 mg/dl — ABNORMAL HIGH (ref 70–99)
Total Bilirubin: 0.66 mg/dL (ref 0.20–1.20)
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
## 2012-07-24 NOTE — Patient Instructions (Signed)
Unfortunately recent bronchoscopy showed evidence for recurrent lung cancer. I will order a PET scan for staging of her disease. Followup visit in 2 weeks.

## 2012-07-26 ENCOUNTER — Ambulatory Visit (HOSPITAL_BASED_OUTPATIENT_CLINIC_OR_DEPARTMENT_OTHER): Payer: Medicare Other | Attending: Pulmonary Disease | Admitting: Radiology

## 2012-07-26 VITALS — Ht 70.0 in | Wt 335.0 lb

## 2012-07-26 DIAGNOSIS — G4733 Obstructive sleep apnea (adult) (pediatric): Secondary | ICD-10-CM

## 2012-07-28 ENCOUNTER — Encounter (HOSPITAL_BASED_OUTPATIENT_CLINIC_OR_DEPARTMENT_OTHER): Payer: Medicare Other

## 2012-07-28 LAB — FUNGUS CULTURE W SMEAR
Fungal Smear: NONE SEEN
Special Requests: NORMAL

## 2012-07-31 ENCOUNTER — Emergency Department (HOSPITAL_COMMUNITY): Payer: Medicare Other

## 2012-07-31 ENCOUNTER — Encounter (HOSPITAL_COMMUNITY): Payer: Self-pay | Admitting: Emergency Medicine

## 2012-07-31 ENCOUNTER — Other Ambulatory Visit: Payer: Self-pay

## 2012-07-31 ENCOUNTER — Encounter (HOSPITAL_COMMUNITY)
Admission: RE | Admit: 2012-07-31 | Discharge: 2012-07-31 | Disposition: A | Discharge status: Home / Self Care | Payer: Medicare Other | Source: Ambulatory Visit | Attending: Internal Medicine | Admitting: Internal Medicine

## 2012-07-31 ENCOUNTER — Inpatient Hospital Stay (HOSPITAL_COMMUNITY)
Admission: EM | Admit: 2012-07-31 | Discharge: 2012-08-05 | DRG: 291 | Disposition: A | Discharge status: Home / Self Care | Payer: Medicare Other | Attending: Cardiology | Admitting: Cardiology

## 2012-07-31 DIAGNOSIS — Z87891 Personal history of nicotine dependence: Secondary | ICD-10-CM

## 2012-07-31 DIAGNOSIS — J449 Chronic obstructive pulmonary disease, unspecified: Secondary | ICD-10-CM | POA: Diagnosis present

## 2012-07-31 DIAGNOSIS — E662 Morbid (severe) obesity with alveolar hypoventilation: Secondary | ICD-10-CM | POA: Diagnosis present

## 2012-07-31 DIAGNOSIS — I5033 Acute on chronic diastolic (congestive) heart failure: Secondary | ICD-10-CM

## 2012-07-31 DIAGNOSIS — N183 Chronic kidney disease, stage 3 unspecified: Secondary | ICD-10-CM | POA: Diagnosis present

## 2012-07-31 DIAGNOSIS — M129 Arthropathy, unspecified: Secondary | ICD-10-CM | POA: Diagnosis present

## 2012-07-31 DIAGNOSIS — I509 Heart failure, unspecified: Secondary | ICD-10-CM | POA: Diagnosis present

## 2012-07-31 DIAGNOSIS — I5023 Acute on chronic systolic (congestive) heart failure: Secondary | ICD-10-CM | POA: Diagnosis present

## 2012-07-31 DIAGNOSIS — Z6841 Body Mass Index (BMI) 40.0 and over, adult: Secondary | ICD-10-CM

## 2012-07-31 DIAGNOSIS — E785 Hyperlipidemia, unspecified: Secondary | ICD-10-CM | POA: Diagnosis present

## 2012-07-31 DIAGNOSIS — N289 Disorder of kidney and ureter, unspecified: Secondary | ICD-10-CM | POA: Diagnosis present

## 2012-07-31 DIAGNOSIS — I2789 Other specified pulmonary heart diseases: Secondary | ICD-10-CM | POA: Diagnosis present

## 2012-07-31 DIAGNOSIS — G4733 Obstructive sleep apnea (adult) (pediatric): Secondary | ICD-10-CM | POA: Diagnosis present

## 2012-07-31 DIAGNOSIS — C3490 Malignant neoplasm of unspecified part of unspecified bronchus or lung: Secondary | ICD-10-CM | POA: Diagnosis present

## 2012-07-31 DIAGNOSIS — E739 Lactose intolerance, unspecified: Secondary | ICD-10-CM | POA: Diagnosis present

## 2012-07-31 DIAGNOSIS — I130 Hypertensive heart and chronic kidney disease with heart failure and stage 1 through stage 4 chronic kidney disease, or unspecified chronic kidney disease: Principal | ICD-10-CM | POA: Diagnosis present

## 2012-07-31 DIAGNOSIS — J4489 Other specified chronic obstructive pulmonary disease: Secondary | ICD-10-CM | POA: Diagnosis present

## 2012-07-31 LAB — CBC WITH DIFFERENTIAL/PLATELET
Eosinophils Absolute: 0.1 10*3/uL (ref 0.0–0.7)
Eosinophils Relative: 1 % (ref 0–5)
HCT: 37.8 % — ABNORMAL LOW (ref 39.0–52.0)
Lymphocytes Relative: 25 % (ref 12–46)
Lymphs Abs: 1.4 10*3/uL (ref 0.7–4.0)
MCH: 31.7 pg (ref 26.0–34.0)
MCV: 96.7 fL (ref 78.0–100.0)
Monocytes Absolute: 0.7 10*3/uL (ref 0.1–1.0)
Monocytes Relative: 11 % (ref 3–12)
Platelets: 279 10*3/uL (ref 150–400)
RBC: 3.91 MIL/uL — ABNORMAL LOW (ref 4.22–5.81)
WBC: 5.7 10*3/uL (ref 4.0–10.5)

## 2012-07-31 LAB — COMPREHENSIVE METABOLIC PANEL
ALT: 8 U/L (ref 0–53)
BUN: 26 mg/dL — ABNORMAL HIGH (ref 6–23)
CO2: 24 mEq/L (ref 19–32)
Calcium: 9.3 mg/dL (ref 8.4–10.5)
Creatinine, Ser: 1.43 mg/dL — ABNORMAL HIGH (ref 0.50–1.35)
GFR calc Af Amer: 57 mL/min — ABNORMAL LOW (ref >90)
GFR calc non Af Amer: 50 mL/min — ABNORMAL LOW (ref >90)
Glucose, Bld: 111 mg/dL — ABNORMAL HIGH (ref 70–99)
Total Protein: 8 g/dL (ref 6.0–8.3)

## 2012-07-31 LAB — CBC
HCT: 36.5 % — ABNORMAL LOW (ref 39.0–52.0)
MCH: 31.1 pg (ref 26.0–34.0)
MCV: 97.1 fL (ref 78.0–100.0)
Platelets: 265 10*3/uL (ref 150–400)
RBC: 3.76 MIL/uL — ABNORMAL LOW (ref 4.22–5.81)

## 2012-07-31 LAB — BASIC METABOLIC PANEL
BUN: 26 mg/dL — ABNORMAL HIGH (ref 6–23)
CO2: 23 mEq/L (ref 19–32)
Calcium: 9.1 mg/dL (ref 8.4–10.5)
Creatinine, Ser: 1.46 mg/dL — ABNORMAL HIGH (ref 0.50–1.35)
Glucose, Bld: 108 mg/dL — ABNORMAL HIGH (ref 70–99)

## 2012-07-31 LAB — MAGNESIUM: Magnesium: 2.1 mg/dL (ref 1.5–2.5)

## 2012-07-31 LAB — PROTIME-INR
INR: 1.17 (ref 0.00–1.49)
Prothrombin Time: 14.7 seconds (ref 11.6–15.2)

## 2012-07-31 LAB — PRO B NATRIURETIC PEPTIDE: Pro B Natriuretic peptide (BNP): 1058 pg/mL — ABNORMAL HIGH (ref 0–125)

## 2012-07-31 LAB — APTT: aPTT: 35 seconds (ref 24–37)

## 2012-07-31 MED ORDER — FUROSEMIDE 10 MG/ML IJ SOLN
40.0000 mg | Freq: Two times a day (BID) | INTRAMUSCULAR | Status: DC
  Administered 2012-07-31 – 2012-08-01 (×2): 40 mg via INTRAVENOUS
  Filled 2012-07-31 (×4): qty 4

## 2012-07-31 MED ORDER — FOLIC ACID 1 MG PO TABS
1.0000 mg | ORAL_TABLET | Freq: Every day | ORAL | Status: DC
  Administered 2012-08-01 – 2012-08-05 (×5): 1 mg via ORAL
  Filled 2012-07-31 (×5): qty 1

## 2012-07-31 MED ORDER — ACETAMINOPHEN 325 MG PO TABS: 650.0000 mg | ORAL_TABLET | ORAL | Status: DC | PRN

## 2012-07-31 MED ORDER — DIGOXIN 250 MCG PO TABS
0.2500 mg | ORAL_TABLET | Freq: Every day | ORAL | Status: DC
  Administered 2012-07-31 – 2012-08-04 (×5): 0.25 mg via ORAL
  Filled 2012-07-31 (×5): qty 1

## 2012-07-31 MED ORDER — HEPARIN SODIUM (PORCINE) 5000 UNIT/ML IJ SOLN
5000.0000 [IU] | Freq: Three times a day (TID) | INTRAMUSCULAR | Status: DC
  Administered 2012-07-31 – 2012-08-05 (×15): 5000 [IU] via SUBCUTANEOUS
  Filled 2012-07-31 (×17): qty 1

## 2012-07-31 MED ORDER — SODIUM CHLORIDE 0.9 % IJ SOLN: 3.0000 mL | INTRAMUSCULAR | Status: DC | PRN

## 2012-07-31 MED ORDER — POTASSIUM CHLORIDE CRYS ER 20 MEQ PO TBCR
20.0000 meq | EXTENDED_RELEASE_TABLET | Freq: Two times a day (BID) | ORAL | Status: DC
  Administered 2012-07-31 – 2012-08-02 (×5): 20 meq via ORAL
  Filled 2012-07-31 (×7): qty 1

## 2012-07-31 MED ORDER — PANTOPRAZOLE SODIUM 40 MG PO TBEC
40.0000 mg | DELAYED_RELEASE_TABLET | Freq: Every day | ORAL | Status: DC
  Administered 2012-08-01 – 2012-08-05 (×5): 40 mg via ORAL
  Filled 2012-07-31 (×3): qty 1

## 2012-07-31 MED ORDER — SODIUM CHLORIDE 0.9 % IJ SOLN
3.0000 mL | Freq: Two times a day (BID) | INTRAMUSCULAR | Status: DC
  Administered 2012-07-31 – 2012-08-05 (×11): 3 mL via INTRAVENOUS

## 2012-07-31 MED ORDER — FLUDEOXYGLUCOSE F - 18 (FDG) INJECTION
16.3000 | Freq: Once | INTRAVENOUS | Status: AC | PRN
  Administered 2012-07-31: 16.3 via INTRAVENOUS

## 2012-07-31 MED ORDER — ASPIRIN EC 81 MG PO TBEC
81.0000 mg | DELAYED_RELEASE_TABLET | Freq: Every day | ORAL | Status: DC
  Administered 2012-08-01 – 2012-08-05 (×5): 81 mg via ORAL
  Filled 2012-07-31 (×5): qty 1

## 2012-07-31 MED ORDER — SODIUM CHLORIDE 0.9 % IV SOLN: 250.0000 mL | INTRAVENOUS | Status: DC | PRN

## 2012-07-31 MED ORDER — ATORVASTATIN CALCIUM 40 MG PO TABS
40.0000 mg | ORAL_TABLET | Freq: Every day | ORAL | Status: DC
  Administered 2012-08-01 – 2012-08-04 (×4): 40 mg via ORAL
  Filled 2012-07-31 (×5): qty 1

## 2012-07-31 MED ORDER — CARVEDILOL 3.125 MG PO TABS
3.1250 mg | ORAL_TABLET | Freq: Two times a day (BID) | ORAL | Status: DC
  Administered 2012-07-31 – 2012-08-05 (×10): 3.125 mg via ORAL
  Filled 2012-07-31 (×12): qty 1

## 2012-07-31 MED ORDER — SPIRONOLACTONE 25 MG PO TABS
25.0000 mg | ORAL_TABLET | Freq: Every day | ORAL | Status: DC
  Administered 2012-08-01 – 2012-08-05 (×5): 25 mg via ORAL
  Filled 2012-07-31 (×5): qty 1

## 2012-07-31 MED ORDER — ONDANSETRON HCL 4 MG/2ML IJ SOLN: 4.0000 mg | Freq: Four times a day (QID) | INTRAMUSCULAR | Status: DC | PRN

## 2012-07-31 NOTE — ED Notes (Signed)
Pt c/o SOB x 1 month getting more severe; pt denies CP; pt sts some swelling in legs
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
## 2012-08-01 LAB — BASIC METABOLIC PANEL
BUN: 28 mg/dL — ABNORMAL HIGH (ref 6–23)
Creatinine, Ser: 1.44 mg/dL — ABNORMAL HIGH (ref 0.50–1.35)
GFR calc Af Amer: 57 mL/min — ABNORMAL LOW (ref >90)
GFR calc non Af Amer: 49 mL/min — ABNORMAL LOW (ref >90)
Glucose, Bld: 104 mg/dL — ABNORMAL HIGH (ref 70–99)

## 2012-08-01 MED ORDER — FUROSEMIDE 10 MG/ML IJ SOLN
60.0000 mg | Freq: Two times a day (BID) | INTRAMUSCULAR | Status: DC
  Administered 2012-08-01 – 2012-08-03 (×4): 60 mg via INTRAVENOUS
  Filled 2012-08-01 (×4): qty 6

## 2012-08-01 NOTE — Progress Notes (Signed)
Utilization Review Completed.   Joandy Burget, RN, BSN Nurse Case Manager  [phone]  

## 2012-08-01 NOTE — Plan of Care (Signed)
Problem: Consults Goal: Heart Failure Patient Education (See Patient Education module for education specifics.)  Outcome: Progressing HF video presented to the pt, Handout given and education initiated.  Problem: Phase I Progression Outcomes Goal: EF % per last Echo/documented,Core Reminder form on chart Outcome: Completed/Met Date Met:  08/01/12 EF 50 - 55% from 06/13/12.  Problem: Phase II Progression Outcomes Goal: Walk in hall or up in chair TID Outcome: Progressing Pt is able to walk short distance, but gets very SOB with minimal activity, pt on 2L O2 [place].

## 2012-08-01 NOTE — Progress Notes (Signed)
Subjective:  Patient denies any chest pain states breathing has improved. Denies any cough or hemoptysis. Denies fever or chills. Patient states he has appointment with oncology for consideration for chemotherapy and radiation on July 9  Objective:  Vital Signs in the last 24 hours: Temp:  [95.2 F (35.1 C)-98.3 F (36.8 C)] 95.2 F (35.1 C) (07/03 0521) Pulse Rate:  [63-89] 70 (07/03 0521) Resp:  [18-40] 19 (07/03 0521) BP: (98-140)/(54-102) 120/63 mmHg (07/03 0521) SpO2:  [84 %-100 %] 100 % (07/03 0521) Weight:  [148.326 kg (327 lb)-148.4 kg (327 lb 2.6 oz)] 148.326 kg (327 lb) (07/03 0521)  Intake/Output from previous day: 07/02 0701 - 07/03 0700 In: 340 [P.O.:340] Out: 1775 [Urine:1775] Intake/Output from this shift: Total I/O In: 240 [P.O.:240] Out: 250 [Urine:250]  Physical Exam: Neck: no adenopathy, no carotid bruit, no JVD and supple, symmetrical, trachea midline Lungs: Bibasilar rales notedair entry slightly improved Heart: regular rate and rhythm, S1, S2 normal and Soft systolic murmur and S3 gallop noted Abdomen: soft, non-tender; bowel sounds normal; no masses,  no organomegaly Extremities: No clubbing cyanosis 1+ edema noted left more than right leg  Lab Results:  Recent Labs  07/31/12 1215 07/31/12 1604  WBC 5.7 5.7  HGB 11.7* 12.4*  PLT 265 279    Recent Labs  07/31/12 1604 08/01/12 0430  NA 137 135  K 4.7 4.2  CL 103 100  CO2 24 22  GLUCOSE 111* 104*  BUN 26* 28*  CREATININE 1.43* 1.44*   No results found for this basename: TROPONINI, CK, MB,  in the last 72 hours Hepatic Function Panel  Recent Labs  07/31/12 1604  PROT 8.0  ALBUMIN 3.1*  AST 19  ALT 8  ALKPHOS 69  BILITOT 0.6   No results found for this basename: CHOL,  in the last 72 hours No results found for this basename: PROTIME,  in the last 72 hours  Imaging: Imaging results have been reviewed and Dg Chest 2 View  07/31/2012   *RADIOLOGY REPORT*  Clinical Data: Shortness  of breath.  Hypertension.  CHEST - 2 VIEW  Comparison: 07/03/2012.  Findings: Stable enlargement of the cardiac silhouette.  Increased prominence of the pulmonary vasculature and interstitial markings with airspace opacity throughout the majority of both lungs. Probable small bilateral pleural effusions.  Thoracic spine degenerative changes.  IMPRESSION: Interval changes of congestive heart failure.   Original Report Authenticated By: Beckie Salts, M.D.   Nm Pet Image Restag (ps) Skull Base To Thigh  07/31/2012   *RADIOLOGY REPORT*  Clinical Data: Subsequent treatment strategy for left lung cancer.  NUCLEAR MEDICINE PET SKULL BASE TO THIGH  Fasting Blood Glucose:  108  Technique:  16.3 mCi F-18 FDG was injected intravenously. CT data was obtained and used for attenuation correction and anatomic localization only.  (This was not acquired as a diagnostic CT examination.) Additional exam technical data entered on technologist worksheet.  Comparison:  12/06/2011  Findings:  Evaluation is constrained due to attenuation artifact related to body habitus.  Neck: No hypermetabolic lymph nodes in the neck.  Chest:  Lung parenchyma is poorly evaluated due to respiratory motion.  Prior left lower lobectomy.  Patchy opacity in the posterior left upper lobe (series 2/image 68), progressed, max SUV 10.1.  Patchy opacity in the posterior right upper lobe (series 2/image 68), new, max SUV 11.8. Differential considerations include multifocal infection or tumor with lymphangitic spread.  Underlying moderate centrilobular and paraseptal emphysematous changes.  Subpleural reticulation/fibrosis in the lower  lungs.  No hypermetabolic mediastinal or hilar nodes.  1.5 cm short axis high right paratracheal node (series 2/image 58), new, although non- FDG-avid.  Abdomen/Pelvis:  Motion degraded images.  No abnormal hypermetabolic activity within the liver, pancreas, adrenal glands, or spleen.  No hypermetabolic lymph nodes in the abdomen or  pelvis.  Skeleton:  Heterogeneous uptake throughout the visualized osseous structures, without focal hypermetabolic activity to suggest skeletal metastasis.  IMPRESSION: Status post left lower lobectomy.  Patchy opacities in the posterior upper lobes bilaterally, progressed, max SUV 11.8.  Differential considerations include multifocal infection or tumor with lymphangitic spread.  1.5 cm short axis high right paratracheal node, new, although without definite hypermetabolism.  Study quality is markedly diminished due to artifact.  In this patient, CT with contrast may be of better diagnostic quality for future examinations.  These results were called by telephone on 07/01/2012 at 1425 hours to Dr. Arbutus Ped, who verbally acknowledged these results.   Original Report Authenticated By: Charline Bills, M.D.    Cardiac Studies:  Assessment/Plan:  Acute on chronic decompensated systolic heart failure  Hypertensive heart disease  Recurrent non-small cell CA of left lung status post left upper lobectomy in the past  Morbid obesity  COPD with pulmonary hypertension  Probable obstructive sleep apnea/obesity hypoventilation syndrome  Remote tobacco abuse  Glucose intolerance  Mild renal insufficiency stage II  Plan As per orders Increase Lasix as per orders Dr. Algie Coffer on call for the weekend  LOS: 1 day    Jamey Demchak N 08/01/2012, 8:41 AM

## 2012-08-01 NOTE — Progress Notes (Signed)
Assessment completed, VSS, denies any pain or discomfort, Pt c/o SOB with activity. Pt on 2 L O2 [place] up to chair comfortable at this time no distress noticed.

## 2012-08-01 NOTE — Progress Notes (Signed)
08/01/12 1600 In to complete Heart Failure home health screen.  Pt. would be interested in having home health, and he chose [facility].  TC to [name], with [facility], to give referral for [facility] RN and aide.  Physician please complete a face to face documentation.   Tera Mater, RN, BSN NCM [phone]

## 2012-08-01 NOTE — Clinical Documentation Improvement (Signed)
THIS DOCUMENT IS NOT A PERMANENT PART OF THE MEDICAL RECORD  Please update your documentation with the medical record to reflect your response to this query. If you need assistance completing the query process, please call [phone].  08/01/12   Dr. Sharyn Lull,  In a better effort to capture your patient's severity of illness, reflect appropriate length of stay and utilization of resources, a review of the patient medical record has revealed the following indicators:   "Mild Renal Insufficiency, stage II" documented in pn 08/01/12  GFR Range in 2014  (black male)  82 - 56  GFR Range in 2013  (black male)  72 - 29   Based on your clinical judgment, please document in the progress notes and discharge summary if a condition below provides greater specificity regarding the patient's "mild renal insufficiency:   - CKD Stage I -  GFR > OR = 90  - CKD Stage II - GFR 60-89  - CKD Stage III - GFR 30-59  - CKD Stage IV - GFR 15-29  - CKD Stage V - GFR < 15  - ESRD (End Stage Renal Disease)  - Other Condition  - Unable to Clinically Determine    You may use possible, probable, or suspect with inpatient documentation.   Possible, probable, suspected diagnoses MUST be documented at the time of discharge unless ruled out or confirmed.  Reviewed: 08/20/12 - no response to cdi query.  retroquery posted by donna every.  Stage 3 ckd documented.   Thank You,  Jerral Ralph  RN BSN CCDS Certified  Clinical Documentation Specialist [phone] Health Information Management [place]

## 2012-08-02 LAB — CBC
HCT: 35.2 % — ABNORMAL LOW (ref 39.0–52.0)
Hemoglobin: 11.5 g/dL — ABNORMAL LOW (ref 13.0–17.0)
MCH: 31.5 pg (ref 26.0–34.0)
MCHC: 32.7 g/dL (ref 30.0–36.0)
RDW: 14.7 % (ref 11.5–15.5)

## 2012-08-02 LAB — BASIC METABOLIC PANEL
BUN: 28 mg/dL — ABNORMAL HIGH (ref 6–23)
Chloride: 101 mEq/L (ref 96–112)
Creatinine, Ser: 1.47 mg/dL — ABNORMAL HIGH (ref 0.50–1.35)
GFR calc Af Amer: 56 mL/min — ABNORMAL LOW (ref >90)
GFR calc non Af Amer: 48 mL/min — ABNORMAL LOW (ref >90)
Glucose, Bld: 100 mg/dL — ABNORMAL HIGH (ref 70–99)

## 2012-08-02 NOTE — Progress Notes (Signed)
Subjective:  Short of breath with activity. Afebrile.   Objective:  Vital Signs in the last 24 hours: Temp:  [97.5 F (36.4 C)-98.1 F (36.7 C)] 97.5 F (36.4 C) (07/04 1404) Pulse Rate:  [56-72] 56 (07/04 1404) Cardiac Rhythm:  [-] Normal sinus rhythm (07/04 0750) Resp:  [18-20] 20 (07/04 1404) BP: (97-130)/(52-99) 97/52 mmHg (07/04 1404) SpO2:  [98 %-100 %] 100 % (07/04 1404) Weight:  [147.963 kg (326 lb 3.2 oz)] 147.963 kg (326 lb 3.2 oz) (07/04 0529)  Physical Exam: BP Readings from Last 1 Encounters:  08/02/12 97/52     Wt Readings from Last 1 Encounters:  08/02/12 147.963 kg (326 lb 3.2 oz)    Weight change: -0.437 kg (-15.4 oz)  HEENT: [place]/AT, Eyes-Brown, PERL, EOMI, Conjunctiva-Pink, Sclera-Non-icteric Neck: No JVD, No bruit, Trachea midline. Lungs:  Basal crackles, Bilateral. Cardiac:  Regular rhythm, normal S1 and S2, no S3.  Abdomen:  Soft, non-tender. Extremities:  2 + edema present. No cyanosis. No clubbing. CNS: AxOx3, Cranial nerves grossly intact, moves all 4 extremities. Right handed. Skin: Warm and dry.   Intake/Output from previous day: 07/03 0701 - 07/04 0700 In: 941 [P.O.:938; I.V.:3] Out: 1850 [Urine:1850]    Lab Results: BMET    Component Value Date/Time   NA 136 08/02/2012 0415   NA 138 07/24/2012 0859   K 4.7 08/02/2012 0415   K 4.3 07/24/2012 0859   CL 101 08/02/2012 0415   CL 105 07/24/2012 0859   CO2 24 08/02/2012 0415   CO2 23 07/24/2012 0859   GLUCOSE 100* 08/02/2012 0415   GLUCOSE 126* 07/24/2012 0859   BUN 28* 08/02/2012 0415   BUN 18.5 07/24/2012 0859   CREATININE 1.47* 08/02/2012 0415   CREATININE 1.3 07/24/2012 0859   CALCIUM 9.1 08/02/2012 0415   CALCIUM 9.6 07/24/2012 0859   GFRNONAA 48* 08/02/2012 0415   GFRAA 56* 08/02/2012 0415   CBC    Component Value Date/Time   WBC 4.1 08/02/2012 0415   WBC 4.6 07/24/2012 0859   RBC 3.65* 08/02/2012 0415   RBC 3.67* 07/24/2012 0859   HGB 11.5* 08/02/2012 0415   HGB 11.4* 07/24/2012 0859   HCT 35.2* 08/02/2012  0415   HCT 35.4* 07/24/2012 0859   PLT 269 08/02/2012 0415   PLT 247 07/24/2012 0859   MCV 96.4 08/02/2012 0415   MCV 96.5 07/24/2012 0859   MCH 31.5 08/02/2012 0415   MCH 31.1 07/24/2012 0859   MCHC 32.7 08/02/2012 0415   MCHC 32.2 07/24/2012 0859   RDW 14.7 08/02/2012 0415   RDW 14.3 07/24/2012 0859   LYMPHSABS 1.4 07/31/2012 1604   LYMPHSABS 1.0 07/24/2012 0859   MONOABS 0.7 07/31/2012 1604   MONOABS 0.5 07/24/2012 0859   EOSABS 0.1 07/31/2012 1604   EOSABS 0.1 07/24/2012 0859   BASOSABS 0.0 07/31/2012 1604   BASOSABS 0.0 07/24/2012 0859   CARDIAC ENZYMES Lab Results  Component Value Date   CKTOTAL 95 06/28/2010   CKMB 1.4 06/28/2010   TROPONINI <0.30 06/29/2012    Scheduled Meds: . aspirin EC  81 mg Oral Daily  . atorvastatin  40 mg Oral q1800  . carvedilol  3.125 mg Oral BID WC  . digoxin  0.25 mg Oral Daily  . folic acid  1 mg Oral Daily  . furosemide  60 mg Intravenous Q12H  . heparin  5,000 Units Subcutaneous Q8H  . pantoprazole  40 mg Oral Q0600  . potassium chloride  20 mEq Oral BID  . sodium chloride  3 mL Intravenous Q12H  . 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

## 2012-08-02 NOTE — Progress Notes (Signed)
Assessment completed, pt up to the chair, denies any pain or discomfort, pt refers he still getting very SOB with minimal activity. Pt on 2 L O2 [place] with saturation on 100%. Family member visiting the pt at this time. We'll continue with POC.

## 2012-08-03 LAB — BASIC METABOLIC PANEL
BUN: 30 mg/dL — ABNORMAL HIGH (ref 6–23)
CO2: 24 mEq/L (ref 19–32)
GFR calc Af Amer: 53 mL/min — ABNORMAL LOW (ref >90)
Glucose, Bld: 97 mg/dL (ref 70–99)
Sodium: 135 mEq/L (ref 135–145)

## 2012-08-03 MED ORDER — FUROSEMIDE 20 MG PO TABS
60.0000 mg | ORAL_TABLET | Freq: Two times a day (BID) | ORAL | Status: DC
  Administered 2012-08-03 – 2012-08-05 (×4): 60 mg via ORAL
  Filled 2012-08-03 (×6): qty 1

## 2012-08-03 NOTE — Progress Notes (Signed)
Subjective:  Decreasing leg edema. Shortness of breath continues.  Objective:  Vital Signs in the last 24 hours: Temp:  [97.5 F (36.4 C)-98.3 F (36.8 C)] 97.5 F (36.4 C) (07/05 0516) Pulse Rate:  [56-73] 73 (07/05 0941) Cardiac Rhythm:  [-] Normal sinus rhythm (07/05 0842) Resp:  [17-20] 20 (07/05 0516) BP: (97-123)/(44-57) 117/57 mmHg (07/05 0516) SpO2:  [100 %] 100 % (07/05 0516) Weight:  [146.875 kg (323 lb 12.8 oz)] 146.875 kg (323 lb 12.8 oz) (07/05 0516)  Physical Exam: BP Readings from Last 1 Encounters:  08/03/12 117/57     Wt Readings from Last 1 Encounters:  08/03/12 146.875 kg (323 lb 12.8 oz)    Weight change: -1.089 kg (-2 lb 6.4 oz)  HEENT: [place]/AT, Eyes-Brown, PERL, EOMI, Conjunctiva-Pink, Sclera-Non-icteric Neck: No JVD, No bruit, Trachea midline. Lungs:  Clearing, Bilateral. Cardiac:  Regular rhythm, normal S1 and S2, no S3.  Abdomen:  Soft, non-tender. Extremities:  1 + edema present. No cyanosis. No clubbing. CNS: AxOx3, Cranial nerves grossly intact, moves all 4 extremities. Right handed. Skin: Warm and dry.   Intake/Output from previous day: 07/04 0701 - 07/05 0700 In: 1123 [P.O.:1120; I.V.:3] Out: 2376 [Urine:2375; Stool:1]    Lab Results: BMET    Component Value Date/Time   NA 135 08/03/2012 0540   NA 138 07/24/2012 0859   K 5.0 08/03/2012 0540   K 4.3 07/24/2012 0859   CL 100 08/03/2012 0540   CL 105 07/24/2012 0859   CO2 24 08/03/2012 0540   CO2 23 07/24/2012 0859   GLUCOSE 97 08/03/2012 0540   GLUCOSE 126* 07/24/2012 0859   BUN 30* 08/03/2012 0540   BUN 18.5 07/24/2012 0859   CREATININE 1.54* 08/03/2012 0540   CREATININE 1.3 07/24/2012 0859   CALCIUM 9.3 08/03/2012 0540   CALCIUM 9.6 07/24/2012 0859   GFRNONAA 45* 08/03/2012 0540   GFRAA 53* 08/03/2012 0540   CBC    Component Value Date/Time   WBC 4.1 08/02/2012 0415   WBC 4.6 07/24/2012 0859   RBC 3.65* 08/02/2012 0415   RBC 3.67* 07/24/2012 0859   HGB 11.5* 08/02/2012 0415   HGB 11.4* 07/24/2012 0859   HCT 35.2* 08/02/2012 0415   HCT 35.4* 07/24/2012 0859   PLT 269 08/02/2012 0415   PLT 247 07/24/2012 0859   MCV 96.4 08/02/2012 0415   MCV 96.5 07/24/2012 0859   MCH 31.5 08/02/2012 0415   MCH 31.1 07/24/2012 0859   MCHC 32.7 08/02/2012 0415   MCHC 32.2 07/24/2012 0859   RDW 14.7 08/02/2012 0415   RDW 14.3 07/24/2012 0859   LYMPHSABS 1.4 07/31/2012 1604   LYMPHSABS 1.0 07/24/2012 0859   MONOABS 0.7 07/31/2012 1604   MONOABS 0.5 07/24/2012 0859   EOSABS 0.1 07/31/2012 1604   EOSABS 0.1 07/24/2012 0859   BASOSABS 0.0 07/31/2012 1604   BASOSABS 0.0 07/24/2012 0859   CARDIAC ENZYMES Lab Results  Component Value Date   CKTOTAL 95 06/28/2010   CKMB 1.4 06/28/2010   TROPONINI <0.30 06/29/2012    Scheduled Meds: . aspirin EC  81 mg Oral Daily  . atorvastatin  40 mg Oral q1800  . carvedilol  3.125 mg Oral BID WC  . digoxin  0.25 mg Oral Daily  . folic acid  1 mg Oral Daily  . furosemide  60 mg Oral BID  . heparin  5,000 Units Subcutaneous Q8H  . pantoprazole  40 mg Oral Q0600  . sodium chloride  3 mL Intravenous Q12H  . 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 DC potassium    LOS: 3 days    Orpah Cobb  MD  08/03/2012, 11:13 AM

## 2012-08-04 LAB — COMPREHENSIVE METABOLIC PANEL
Alkaline Phosphatase: 64 U/L (ref 39–117)
BUN: 33 mg/dL — ABNORMAL HIGH (ref 6–23)
Chloride: 99 mEq/L (ref 96–112)
GFR calc Af Amer: 49 mL/min — ABNORMAL LOW (ref >90)
Glucose, Bld: 95 mg/dL (ref 70–99)
Potassium: 4.8 mEq/L (ref 3.5–5.1)
Total Bilirubin: 0.4 mg/dL (ref 0.3–1.2)

## 2012-08-04 LAB — CBC
HCT: 36.1 % — ABNORMAL LOW (ref 39.0–52.0)
Hemoglobin: 11.5 g/dL — ABNORMAL LOW (ref 13.0–17.0)
WBC: 3.8 10*3/uL — ABNORMAL LOW (ref 4.0–10.5)

## 2012-08-04 MED ORDER — DIGOXIN 125 MCG PO TABS
0.1250 mg | ORAL_TABLET | Freq: Every day | ORAL | Status: DC
  Administered 2012-08-05: 0.125 mg via ORAL
  Filled 2012-08-04: qty 1

## 2012-08-04 NOTE — Progress Notes (Signed)
Subjective:  Decreasing shortness of breath. No chest pain. Afebrile.  Objective:  Vital Signs in the last 24 hours: Temp:  [98 F (36.7 C)] 98 F (36.7 C) (07/06 1359) Pulse Rate:  [60-70] 63 (07/06 1359) Cardiac Rhythm:  [-] Normal sinus rhythm (07/06 0827) Resp:  [20] 20 (07/06 1359) BP: (101-133)/(54-60) 121/54 mmHg (07/06 1359) SpO2:  [100 %] 100 % (07/06 1359) Weight:  [146.6 kg (323 lb 3.1 oz)] 146.6 kg (323 lb 3.1 oz) (07/06 0531)  Physical Exam: BP Readings from Last 1 Encounters:  08/04/12 121/54     Wt Readings from Last 1 Encounters:  08/04/12 146.6 kg (323 lb 3.1 oz)    Weight change: -0.275 kg (-9.7 oz)  HEENT: [place]/AT, Eyes-Brown, PERL, EOMI, Conjunctiva-Pink, Sclera-Non-icteric Neck: No JVD, No bruit, Trachea midline. Lungs:  Clearing, Bilateral. Cardiac:  Regular rhythm, normal S1 and S2, no S3.  Abdomen:  Soft, non-tender. Extremities:  1 + edema present. No cyanosis. No clubbing. CNS: AxOx3, Cranial nerves grossly intact, moves all 4 extremities. Right handed. Skin: Warm and dry.   Intake/Output from previous day: 07/05 0701 - 07/06 0700 In: 960 [P.O.:960] Out: 2325 [Urine:2325]    Lab Results: BMET    Component Value Date/Time   NA 135 08/04/2012 0545   NA 138 07/24/2012 0859   K 4.8 08/04/2012 0545   K 4.3 07/24/2012 0859   CL 99 08/04/2012 0545   CL 105 07/24/2012 0859   CO2 26 08/04/2012 0545   CO2 23 07/24/2012 0859   GLUCOSE 95 08/04/2012 0545   GLUCOSE 126* 07/24/2012 0859   BUN 33* 08/04/2012 0545   BUN 18.5 07/24/2012 0859   CREATININE 1.63* 08/04/2012 0545   CREATININE 1.3 07/24/2012 0859   CALCIUM 9.3 08/04/2012 0545   CALCIUM 9.6 07/24/2012 0859   GFRNONAA 42* 08/04/2012 0545   GFRAA 49* 08/04/2012 0545   CBC    Component Value Date/Time   WBC 3.8* 08/04/2012 0545   WBC 4.6 07/24/2012 0859   RBC 3.75* 08/04/2012 0545   RBC 3.67* 07/24/2012 0859   HGB 11.5* 08/04/2012 0545   HGB 11.4* 07/24/2012 0859   HCT 36.1* 08/04/2012 0545   HCT 35.4* 07/24/2012 0859    PLT 274 08/04/2012 0545   PLT 247 07/24/2012 0859   MCV 96.3 08/04/2012 0545   MCV 96.5 07/24/2012 0859   MCH 30.7 08/04/2012 0545   MCH 31.1 07/24/2012 0859   MCHC 31.9 08/04/2012 0545   MCHC 32.2 07/24/2012 0859   RDW 14.3 08/04/2012 0545   RDW 14.3 07/24/2012 0859   LYMPHSABS 1.4 07/31/2012 1604   LYMPHSABS 1.0 07/24/2012 0859   MONOABS 0.7 07/31/2012 1604   MONOABS 0.5 07/24/2012 0859   EOSABS 0.1 07/31/2012 1604   EOSABS 0.1 07/24/2012 0859   BASOSABS 0.0 07/31/2012 1604   BASOSABS 0.0 07/24/2012 0859   CARDIAC ENZYMES Lab Results  Component Value Date   CKTOTAL 95 06/28/2010   CKMB 1.4 06/28/2010   TROPONINI <0.30 06/29/2012    Scheduled Meds: . aspirin EC  81 mg Oral Daily  . atorvastatin  40 mg Oral q1800  . carvedilol  3.125 mg Oral BID WC  . [START ON 08/05/2012] digoxin  0.125 mg Oral Daily  . folic acid  1 mg Oral Daily  . furosemide  60 mg Oral BID  . heparin  5,000 Units Subcutaneous Q8H  . pantoprazole  40 mg Oral Q0600  . sodium chloride  3 mL Intravenous Q12H  . 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   LOS: 4 days    Orpah Cobb  MD  08/04/2012, 4:14 PM

## 2012-08-04 NOTE — Progress Notes (Signed)
Pt's sat on 3l 95%. 02 turned down to 2l with sats at 94%. O2 removed with sats dropping to 81% (all while sitting in chair). O2 replaced at 2l . Pt able to use I/S to > 1500

## 2012-08-05 ENCOUNTER — Inpatient Hospital Stay (HOSPITAL_COMMUNITY): Payer: Medicare Other

## 2012-08-05 LAB — PRO B NATRIURETIC PEPTIDE: Pro B Natriuretic peptide (BNP): 50.4 pg/mL (ref 0–125)

## 2012-08-05 LAB — BASIC METABOLIC PANEL
Calcium: 9.3 mg/dL (ref 8.4–10.5)
GFR calc non Af Amer: 44 mL/min — ABNORMAL LOW (ref >90)
Sodium: 135 mEq/L (ref 135–145)

## 2012-08-05 MED ORDER — FUROSEMIDE 40 MG PO TABS: 40.0000 mg | ORAL_TABLET | Freq: Two times a day (BID) | ORAL | Status: DC

## 2012-08-05 MED ORDER — DIGOXIN 125 MCG PO TABS: 0.1250 mg | ORAL_TABLET | Freq: Every day | ORAL | Status: DC

## 2012-08-05 MED ORDER — POTASSIUM CHLORIDE CRYS ER 20 MEQ PO TBCR: 20.0000 meq | EXTENDED_RELEASE_TABLET | Freq: Two times a day (BID) | ORAL | Status: DC

## 2012-08-05 NOTE — Progress Notes (Signed)
Patient resting comfortable in bed throughout shift.  Patient denies pain and is in no acute distress. RN will continue to monitor. Louretta Parma, RN
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
## 2012-08-07 ENCOUNTER — Other Ambulatory Visit (HOSPITAL_BASED_OUTPATIENT_CLINIC_OR_DEPARTMENT_OTHER): Payer: Medicare Other | Admitting: Lab

## 2012-08-07 ENCOUNTER — Telehealth: Payer: Self-pay | Admitting: *Deleted

## 2012-08-07 ENCOUNTER — Ambulatory Visit (HOSPITAL_BASED_OUTPATIENT_CLINIC_OR_DEPARTMENT_OTHER): Payer: Medicare Other | Admitting: Internal Medicine

## 2012-08-07 ENCOUNTER — Encounter: Payer: Self-pay | Admitting: Internal Medicine

## 2012-08-07 ENCOUNTER — Telehealth: Payer: Self-pay | Admitting: Internal Medicine

## 2012-08-07 VITALS — BP 112/54 | HR 88 | Temp 97.0°F | Resp 20 | Ht 70.0 in | Wt 327.2 lb

## 2012-08-07 DIAGNOSIS — C3490 Malignant neoplasm of unspecified part of unspecified bronchus or lung: Secondary | ICD-10-CM

## 2012-08-07 DIAGNOSIS — C3492 Malignant neoplasm of unspecified part of left bronchus or lung: Secondary | ICD-10-CM

## 2012-08-07 LAB — CBC WITH DIFFERENTIAL/PLATELET
BASO%: 1 % (ref 0.0–2.0)
EOS%: 3.4 % (ref 0.0–7.0)
LYMPH%: 26.4 % (ref 14.0–49.0)
MCHC: 33.8 g/dL (ref 32.0–36.0)
MONO#: 0.4 10*3/uL (ref 0.1–0.9)
Platelets: 314 10*3/uL (ref 140–400)
RBC: 3.67 10*6/uL — ABNORMAL LOW (ref 4.20–5.82)
WBC: 3.6 10*3/uL — ABNORMAL LOW (ref 4.0–10.3)
lymph#: 1 10*3/uL (ref 0.9–3.3)

## 2012-08-07 LAB — COMPREHENSIVE METABOLIC PANEL (CC13)
ALT: 13 U/L (ref 0–55)
AST: 22 U/L (ref 5–34)
Alkaline Phosphatase: 67 U/L (ref 40–150)
CO2: 25 mEq/L (ref 22–29)
Sodium: 137 mEq/L (ref 136–145)
Total Bilirubin: 0.49 mg/dL (ref 0.20–1.20)
Total Protein: 8.2 g/dL (ref 6.4–8.3)
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
## 2012-08-07 NOTE — Telephone Encounter (Signed)
gv and printed appt sched and avs for pt...emailed MW to add tx... °

## 2012-08-07 NOTE — Telephone Encounter (Signed)
Per staff message and POF I have scheduled appts.  JMW  

## 2012-08-07 NOTE — Patient Instructions (Signed)
You have recurrent non-small cell lung cancer. We discussed treatment options including palliative care versus systemic chemotherapy. He would like to proceed with systemic chemotherapy as soon as possible and I recommended for you a regimen consisting of carboplatin, paclitaxel and Avastin. Followup visit in 2 weeks

## 2012-08-08 ENCOUNTER — Other Ambulatory Visit: Payer: Self-pay | Admitting: Pulmonary Disease

## 2012-08-08 DIAGNOSIS — G4733 Obstructive sleep apnea (adult) (pediatric): Secondary | ICD-10-CM

## 2012-08-13 ENCOUNTER — Other Ambulatory Visit (HOSPITAL_BASED_OUTPATIENT_CLINIC_OR_DEPARTMENT_OTHER): Payer: Medicare Other | Admitting: Lab

## 2012-08-13 ENCOUNTER — Other Ambulatory Visit: Payer: Self-pay | Admitting: *Deleted

## 2012-08-13 ENCOUNTER — Ambulatory Visit (HOSPITAL_BASED_OUTPATIENT_CLINIC_OR_DEPARTMENT_OTHER): Payer: Medicare Other

## 2012-08-13 DIAGNOSIS — Z5112 Encounter for antineoplastic immunotherapy: Secondary | ICD-10-CM

## 2012-08-13 DIAGNOSIS — C3430 Malignant neoplasm of lower lobe, unspecified bronchus or lung: Secondary | ICD-10-CM

## 2012-08-13 DIAGNOSIS — Z5111 Encounter for antineoplastic chemotherapy: Secondary | ICD-10-CM

## 2012-08-13 DIAGNOSIS — C3492 Malignant neoplasm of unspecified part of left bronchus or lung: Secondary | ICD-10-CM

## 2012-08-13 LAB — COMPREHENSIVE METABOLIC PANEL (CC13)
AST: 26 U/L (ref 5–34)
Albumin: 3.4 g/dL — ABNORMAL LOW (ref 3.5–5.0)
Alkaline Phosphatase: 70 U/L (ref 40–150)
BUN: 32.8 mg/dL — ABNORMAL HIGH (ref 7.0–26.0)
Potassium: 4.9 mEq/L (ref 3.5–5.1)
Sodium: 137 mEq/L (ref 136–145)
Total Protein: 8.1 g/dL (ref 6.4–8.3)

## 2012-08-13 LAB — CBC WITH DIFFERENTIAL/PLATELET
BASO%: 0.2 % (ref 0.0–2.0)
MCHC: 32.1 g/dL (ref 32.0–36.0)
MONO#: 0.4 10*3/uL (ref 0.1–0.9)
RBC: 3.89 10*6/uL — ABNORMAL LOW (ref 4.20–5.82)
RDW: 14.9 % — ABNORMAL HIGH (ref 11.0–14.6)
WBC: 4.3 10*3/uL (ref 4.0–10.3)
lymph#: 1.3 10*3/uL (ref 0.9–3.3)
nRBC: 0 % (ref 0–0)

## 2012-08-13 MED ORDER — SODIUM CHLORIDE 0.9 % IV SOLN
Freq: Once | INTRAVENOUS | Status: AC
  Administered 2012-08-13: 250 mL via INTRAVENOUS

## 2012-08-13 MED ORDER — SODIUM CHLORIDE 0.9 % IV SOLN
388.0000 mg | Freq: Once | INTRAVENOUS | Status: AC
  Administered 2012-08-13: 390 mg via INTRAVENOUS
  Filled 2012-08-13: qty 39

## 2012-08-13 MED ORDER — RANITIDINE HCL 50 MG/2ML IJ SOLN
50.0000 mg | Freq: Once | INTRAVENOUS | Status: AC
  Administered 2012-08-13: 50 mg via INTRAVENOUS
  Filled 2012-08-13: qty 2

## 2012-08-13 MED ORDER — PACLITAXEL CHEMO INJECTION 300 MG/50ML
175.0000 mg/m2 | Freq: Once | INTRAVENOUS | Status: AC
  Administered 2012-08-13: 474 mg via INTRAVENOUS
  Filled 2012-08-13: qty 79

## 2012-08-13 MED ORDER — FAMOTIDINE IN NACL 20-0.9 MG/50ML-% IV SOLN: 20.0000 mg | Freq: Once | INTRAVENOUS | Status: DC

## 2012-08-13 MED ORDER — ONDANSETRON 16 MG/50ML IVPB (CHCC)
16.0000 mg | Freq: Once | INTRAVENOUS | Status: AC
  Administered 2012-08-13: 16 mg via INTRAVENOUS

## 2012-08-13 MED ORDER — LIDOCAINE-PRILOCAINE 2.5-2.5 % EX CREA: TOPICAL_CREAM | CUTANEOUS | Status: DC | PRN

## 2012-08-13 MED ORDER — DEXAMETHASONE SODIUM PHOSPHATE 20 MG/5ML IJ SOLN
20.0000 mg | Freq: Once | INTRAMUSCULAR | Status: AC
  Administered 2012-08-13: 20 mg via INTRAVENOUS

## 2012-08-13 MED ORDER — SODIUM CHLORIDE 0.9 % IV SOLN
175.0000 mg/m2 | Freq: Once | INTRAVENOUS | Status: DC
  Filled 2012-08-13: qty 79

## 2012-08-13 MED ORDER — SODIUM CHLORIDE 0.9 % IV SOLN
15.0000 mg/kg | Freq: Once | INTRAVENOUS | Status: AC
  Administered 2012-08-13: 2225 mg via INTRAVENOUS
  Filled 2012-08-13: qty 89

## 2012-08-13 MED ORDER — DIPHENHYDRAMINE HCL 50 MG/ML IJ SOLN
50.0000 mg | Freq: Once | INTRAMUSCULAR | Status: AC
  Administered 2012-08-13: 50 mg via INTRAVENOUS

## 2012-08-13 NOTE — Progress Notes (Signed)
Pt states he feels better after 3L O2 applied.  Pt monitered for 30 min.  O2 sat increased to 100%.  Pt states he feels better.  Reports walking to the bathroom made him SOB.  States he has O2 at home.  Pt dc'd home with family.

## 2012-08-13 NOTE — Progress Notes (Signed)
Due to poor venous access, pt is going to be schedule for port with Dr Laneta Simmers to be done before next cycle of chemotherapy.  Willette Pa, thoracic navigator will coordinate with Dr Sharee Pimple office.  SLJ

## 2012-08-13 NOTE — Patient Instructions (Addendum)
Bevacizumab injection What is this medicine? BEVACIZUMAB (be va SIZ yoo mab) is a chemotherapy drug. It targets a protein found in many cancer cell types, and halts cancer growth. This drug treats many cancers including non-small cell lung cancer, and colon or rectal cancer. It is usually given with other chemotherapy drugs. This medicine may be used for other purposes; ask your health care provider or pharmacist if you have questions. What should I tell my health care provider before I take this medicine? They need to know if you have any of these conditions: -blood clots -heart disease, including heart failure, heart attack, or chest pain (angina) -high blood pressure -infection (especially a virus infection such as chickenpox, cold sores, or herpes) -kidney disease -lung disease -prior chemotherapy with doxorubicin, daunorubicin, epirubicin, or other anthracycline type chemotherapy agents -recent or ongoing radiation therapy -recent surgery -stroke -an unusual or allergic reaction to bevacizumab, hamster proteins, mouse proteins, other medicines, foods, dyes, or preservatives -pregnant or trying to get pregnant -breast-feeding How should I use this medicine? This medicine is for infusion into a vein. It is given by a health care professional in a hospital or clinic setting. Talk to your pediatrician regarding the use of this medicine in children. Special care may be needed. Overdosage: If you think you have taken too much of this medicine contact a poison control center or emergency room at once. NOTE: This medicine is only for you. Do not share this medicine with others. What if I miss a dose? It is important not to miss your dose. Call your doctor or health care professional if you are unable to keep an appointment. What may interact with this medicine? Interactions are not expected. This list may not describe all possible interactions. Give your health care provider a list of all  the medicines, herbs, non-prescription drugs, or dietary supplements you use. Also tell them if you smoke, drink alcohol, or use illegal drugs. Some items may interact with your medicine. What should I watch for while using this medicine? Your condition will be monitored carefully while you are receiving this medicine. You will need important blood work and urine testing done while you are taking this medicine. During your treatment, let your health care professional know if you have any unusual symptoms, such as difficulty breathing. This medicine may rarely cause 'gastrointestinal perforation' (holes in the stomach, intestines or colon), a serious side effect requiring surgery to repair. This medicine should be started at least 28 days following major surgery and the site of the surgery should be totally healed. Check with your doctor before scheduling dental work or surgery while you are receiving this treatment. Talk to your doctor if you have recently had surgery or if you have a wound that has not healed. Do not become pregnant while taking this medicine. Women should inform their doctor if they wish to become pregnant or think they might be pregnant. There is a potential for serious side effects to an unborn child. Talk to your health care professional or pharmacist for more information. Do not breast-feed an infant while taking this medicine. This medicine has caused ovarian failure in some women. This medicine may interfere with the ability to have a child. You should talk to your doctor or health care professional if you are concerned about your fertility. What side effects may I notice from receiving this medicine? Side effects that you should report to your doctor or health care professional as soon as possible: -allergic reactions like skin  rash, itching or hives, swelling of the face, lips, or tongue -signs of infection - fever or chills, cough, sore throat, pain or trouble passing  urine -signs of decreased platelets or bleeding - bruising, pinpoint red spots on the skin, black, tarry stools, nosebleeds, blood in the urine -breathing problems -changes in vision -chest pain -confusion -jaw pain, especially after dental work -mouth sores -seizures -severe abdominal pain -severe headache -sudden numbness or weakness of the face, arm or leg -swelling of legs or ankles -symptoms of a stroke: change in mental awareness, inability to talk or move one side of the body (especially in patients with lung cancer) -trouble passing urine or change in the amount of urine -trouble speaking or understanding -trouble walking, dizziness, loss of balance or coordination Side effects that usually do not require medical attention (report to your doctor or health care professional if they continue or are bothersome): -constipation -diarrhea -dry skin -headache -loss of appetite -nausea, vomiting This list may not describe all possible side effects. Call your doctor for medical advice about side effects. You may report side effects to FDA at 1-800-FDA-1088. Where should I keep my medicine? This drug is given in a hospital or clinic and will not be stored at home. NOTE: This sheet is a summary. It may not cover all possible information. If you have questions about this medicine, talk to your doctor, pharmacist, or health care provider.  2013, [employer]. (12/17/2009 4:25:37 PM) Carboplatin injection What is this medicine? CARBOPLATIN (KAR boe pla tin) is a chemotherapy drug. It targets fast dividing cells, like cancer cells, and causes these cells to die. This medicine is used to treat ovarian cancer and many other cancers. This medicine may be used for other purposes; ask your health care provider or pharmacist if you have questions. What should I tell my health care provider before I take this medicine? They need to know if you have any of these conditions: -blood  disorders -hearing problems -kidney disease -recent or ongoing radiation therapy -an unusual or allergic reaction to carboplatin, cisplatin, other chemotherapy, other medicines, foods, dyes, or preservatives -pregnant or trying to get pregnant -breast-feeding How should I use this medicine? This drug is usually given as an infusion into a vein. It is administered in a hospital or clinic by a specially trained health care professional. Talk to your pediatrician regarding the use of this medicine in children. Special care may be needed. Overdosage: If you think you have taken too much of this medicine contact a poison control center or emergency room at once. NOTE: This medicine is only for you. Do not share this medicine with others. What if I miss a dose? It is important not to miss a dose. Call your doctor or health care professional if you are unable to keep an appointment. What may interact with this medicine? -medicines for seizures -medicines to increase blood counts like filgrastim, pegfilgrastim, sargramostim -some antibiotics like amikacin, gentamicin, neomycin, streptomycin, tobramycin -vaccines Talk to your doctor or health care professional before taking any of these medicines: -acetaminophen -aspirin -ibuprofen -ketoprofen -naproxen This list may not describe all possible interactions. Give your health care provider a list of all the medicines, herbs, non-prescription drugs, or dietary supplements you use. Also tell them if you smoke, drink alcohol, or use illegal drugs. Some items may interact with your medicine. What should I watch for while using this medicine? Your condition will be monitored carefully while you are receiving this medicine. You will need important  blood work done while you are taking this medicine. This drug may make you feel generally unwell. This is not uncommon, as chemotherapy can affect healthy cells as well as cancer cells. Report any side effects.  Continue your course of treatment even though you feel ill unless your doctor tells you to stop. In some cases, you may be given additional medicines to help with side effects. Follow all directions for their use. Call your doctor or health care professional for advice if you get a fever, chills or sore throat, or other symptoms of a cold or flu. Do not treat yourself. This drug decreases your body's ability to fight infections. Try to avoid being around people who are sick. This medicine may increase your risk to bruise or bleed. Call your doctor or health care professional if you notice any unusual bleeding. Be careful brushing and flossing your teeth or using a toothpick because you may get an infection or bleed more easily. If you have any dental work done, tell your dentist you are receiving this medicine. Avoid taking products that contain aspirin, acetaminophen, ibuprofen, naproxen, or ketoprofen unless instructed by your doctor. These medicines may hide a fever. Do not become pregnant while taking this medicine. Women should inform their doctor if they wish to become pregnant or think they might be pregnant. There is a potential for serious side effects to an unborn child. Talk to your health care professional or pharmacist for more information. Do not breast-feed an infant while taking this medicine. What side effects may I notice from receiving this medicine? Side effects that you should report to your doctor or health care professional as soon as possible: -allergic reactions like skin rash, itching or hives, swelling of the face, lips, or tongue -signs of infection - fever or chills, cough, sore throat, pain or difficulty passing urine -signs of decreased platelets or bleeding - bruising, pinpoint red spots on the skin, black, tarry stools, nosebleeds -signs of decreased red blood cells - unusually weak or tired, fainting spells, lightheadedness -breathing problems -changes in  hearing -changes in vision -chest pain -high blood pressure -low blood counts - This drug may decrease the number of white blood cells, red blood cells and platelets. You may be at increased risk for infections and bleeding. -nausea and vomiting -pain, swelling, redness or irritation at the injection site -pain, tingling, numbness in the hands or feet -problems with balance, talking, walking -trouble passing urine or change in the amount of urine Side effects that usually do not require medical attention (report to your doctor or health care professional if they continue or are bothersome): -hair loss -loss of appetite -metallic taste in the mouth or changes in taste This list may not describe all possible side effects. Call your doctor for medical advice about side effects. You may report side effects to FDA at 1-800-FDA-1088. Where should I keep my medicine? This drug is given in a hospital or clinic and will not be stored at home. NOTE: This sheet is a summary. It may not cover all possible information. If you have questions about this medicine, talk to your doctor, pharmacist, or health care provider.  2012, [employer]. (04/23/2007 2:38:05 PM)Paclitaxel injection What is this medicine? PACLITAXEL (PAK li TAX el) is a chemotherapy drug. It targets fast dividing cells, like cancer cells, and causes these cells to die. This medicine is used to treat ovarian cancer, breast cancer, and other cancers. This medicine may be used for other purposes; ask your  health care provider or pharmacist if you have questions. What should I tell my health care provider before I take this medicine? They need to know if you have any of these conditions: -blood disorders -irregular heartbeat -infection (especially a virus infection such as chickenpox, cold sores, or herpes) -liver disease -previous or ongoing radiation therapy -an unusual or allergic reaction to paclitaxel, alcohol,  polyoxyethylated castor oil, other chemotherapy agents, other medicines, foods, dyes, or preservatives -pregnant or trying to get pregnant -breast-feeding How should I use this medicine? This drug is given as an infusion into a vein. It is administered in a hospital or clinic by a specially trained health care professional. Talk to your pediatrician regarding the use of this medicine in children. Special care may be needed. Overdosage: If you think you have taken too much of this medicine contact a poison control center or emergency room at once. NOTE: This medicine is only for you. Do not share this medicine with others. What if I miss a dose? It is important not to miss your dose. Call your doctor or health care professional if you are unable to keep an appointment. What may interact with this medicine? Do not take this medicine with any of the following medications: -disulfiram -metronidazole This medicine may also interact with the following medications: -cyclosporine -dexamethasone -diazepam -ketoconazole -medicines to increase blood counts like filgrastim, pegfilgrastim, sargramostim -other chemotherapy drugs like cisplatin, doxorubicin, epirubicin, etoposide, teniposide, vincristine -quinidine -testosterone -vaccines -verapamil Talk to your doctor or health care professional before taking any of these medicines: -acetaminophen -aspirin -ibuprofen -ketoprofen -naproxen This list may not describe all possible interactions. Give your health care provider a list of all the medicines, herbs, non-prescription drugs, or dietary supplements you use. Also tell them if you smoke, drink alcohol, or use illegal drugs. Some items may interact with your medicine. What should I watch for while using this medicine? Your condition will be monitored carefully while you are receiving this medicine. You will need important blood work done while you are taking this medicine. This drug may make  you feel generally unwell. This is not uncommon, as chemotherapy can affect healthy cells as well as cancer cells. Report any side effects. Continue your course of treatment even though you feel ill unless your doctor tells you to stop. In some cases, you may be given additional medicines to help with side effects. Follow all directions for their use. Call your doctor or health care professional for advice if you get a fever, chills or sore throat, or other symptoms of a cold or flu. Do not treat yourself. This drug decreases your body's ability to fight infections. Try to avoid being around people who are sick. This medicine may increase your risk to bruise or bleed. Call your doctor or health care professional if you notice any unusual bleeding. Be careful brushing and flossing your teeth or using a toothpick because you may get an infection or bleed more easily. If you have any dental work done, tell your dentist you are receiving this medicine. Avoid taking products that contain aspirin, acetaminophen, ibuprofen, naproxen, or ketoprofen unless instructed by your doctor. These medicines may hide a fever. Do not become pregnant while taking this medicine. Women should inform their doctor if they wish to become pregnant or think they might be pregnant. There is a potential for serious side effects to an unborn child. Talk to your health care professional or pharmacist for more information. Do not breast-feed an infant while taking  this medicine. Men are advised not to father a child while receiving this medicine. What side effects may I notice from receiving this medicine? Side effects that you should report to your doctor or health care professional as soon as possible: -allergic reactions like skin rash, itching or hives, swelling of the face, lips, or tongue -low blood counts - This drug may decrease the number of white blood cells, red blood cells and platelets. You may be at increased risk for  infections and bleeding. -signs of infection - fever or chills, cough, sore throat, pain or difficulty passing urine -signs of decreased platelets or bleeding - bruising, pinpoint red spots on the skin, black, tarry stools, nosebleeds -signs of decreased red blood cells - unusually weak or tired, fainting spells, lightheadedness -breathing problems -chest pain -high or low blood pressure -mouth sores -nausea and vomiting -pain, swelling, redness or irritation at the injection site -pain, tingling, numbness in the hands or feet -slow or irregular heartbeat -swelling of the ankle, feet, hands Side effects that usually do not require medical attention (report to your doctor or health care professional if they continue or are bothersome): -bone pain -complete hair loss including hair on your head, underarms, pubic hair, eyebrows, and eyelashes -changes in the color of fingernails -diarrhea -loosening of the fingernails -loss of appetite -muscle or joint pain -red flush to skin -sweating This list may not describe all possible side effects. Call your doctor for medical advice about side effects. You may report side effects to FDA at 1-800-FDA-1088. Where should I keep my medicine? This drug is given in a hospital or clinic and will not be stored at home. NOTE: This sheet is a summary. It may not cover all possible information. If you have questions about this medicine, talk to your doctor, pharmacist, or health care provider.  2013, [employer]. (12/30/2007 11:54:26 AM)

## 2012-08-13 NOTE — Progress Notes (Signed)
1135 Pt c/o of burning at IV site. IV was stopped and d'cd. Amy Horton,RN,Myrtle Hardin,RN, Helen Lee,RN,Omeka Holben,RN all tried twice without success. At 1325 Felicita Gage was able to obtain IV in pt's rt hand. Dr Arbutus Ped notified of difficulty in placement of IV's. Pt will be scheduled for PAC placement.

## 2012-08-14 ENCOUNTER — Telehealth: Payer: Self-pay | Admitting: *Deleted

## 2012-08-14 ENCOUNTER — Other Ambulatory Visit: Payer: Self-pay | Admitting: *Deleted

## 2012-08-14 ENCOUNTER — Ambulatory Visit (HOSPITAL_BASED_OUTPATIENT_CLINIC_OR_DEPARTMENT_OTHER): Payer: Medicare Other

## 2012-08-14 DIAGNOSIS — C799 Secondary malignant neoplasm of unspecified site: Secondary | ICD-10-CM

## 2012-08-14 DIAGNOSIS — C3490 Malignant neoplasm of unspecified part of unspecified bronchus or lung: Secondary | ICD-10-CM

## 2012-08-14 DIAGNOSIS — Z5189 Encounter for other specified aftercare: Secondary | ICD-10-CM

## 2012-08-14 MED ORDER — PEGFILGRASTIM INJECTION 6 MG/0.6ML
6.0000 mg | Freq: Once | SUBCUTANEOUS | Status: AC
  Administered 2012-08-14: 6 mg via SUBCUTANEOUS
  Filled 2012-08-14: qty 0.6

## 2012-08-14 NOTE — Telephone Encounter (Signed)
Rease here for Neulasta injection following 1st carbo/taxol/avastin chemotherapy.  States that he is doing well.  No nausea, vomiting, or diarrhea.  Is drinking some fluids, but has to restrict them due to his history of heart failure.  Knows to call if he has any problems or concerns.

## 2012-08-14 NOTE — Patient Instructions (Addendum)

## 2012-08-15 ENCOUNTER — Encounter: Payer: Self-pay | Admitting: Surgery

## 2012-08-15 ENCOUNTER — Encounter (HOSPITAL_COMMUNITY): Payer: Self-pay | Admitting: *Deleted

## 2012-08-15 ENCOUNTER — Ambulatory Visit (INDEPENDENT_AMBULATORY_CARE_PROVIDER_SITE_OTHER): Payer: Medicare Other | Admitting: Surgery

## 2012-08-15 VITALS — BP 117/69 | HR 92 | Resp 20 | Ht 69.0 in | Wt 323.0 lb

## 2012-08-15 DIAGNOSIS — C3490 Malignant neoplasm of unspecified part of unspecified bronchus or lung: Secondary | ICD-10-CM

## 2012-08-15 DIAGNOSIS — C3492 Malignant neoplasm of unspecified part of left bronchus or lung: Secondary | ICD-10-CM

## 2012-08-15 MED ORDER — DEXTROSE 5 % IV SOLN
1.5000 g | INTRAVENOUS | Status: AC
  Administered 2012-08-16: 1.5 g via INTRAVENOUS
  Filled 2012-08-15: qty 1.5

## 2012-08-15 NOTE — Progress Notes (Signed)
Pt has history of SOB but denies chest pain. Pt is under the care of Dr. Marni Griffon ( cardiology). Pt denies having a cardiac cath. Pt made aware to Stop taking Aspirin, and herbal medications. Do not take any NSAIDs ie: Ibuprofen, Advil, Naproxen or any medication containing Aspirin.

## 2012-08-16 ENCOUNTER — Encounter: Payer: Self-pay | Admitting: Surgery

## 2012-08-16 ENCOUNTER — Ambulatory Visit (HOSPITAL_COMMUNITY): Payer: Medicare Other | Admitting: Anesthesiology

## 2012-08-16 ENCOUNTER — Encounter (HOSPITAL_COMMUNITY): Payer: Self-pay | Admitting: *Deleted

## 2012-08-16 ENCOUNTER — Ambulatory Visit (HOSPITAL_COMMUNITY): Payer: Medicare Other

## 2012-08-16 ENCOUNTER — Encounter (HOSPITAL_COMMUNITY)
Admission: RE | Disposition: A | Discharge status: Home / Self Care | Payer: Self-pay | Source: Ambulatory Visit | Attending: Surgery

## 2012-08-16 ENCOUNTER — Ambulatory Visit (HOSPITAL_COMMUNITY)
Admission: RE | Admit: 2012-08-16 | Discharge: 2012-08-16 | Disposition: A | Discharge status: Home / Self Care | Payer: Medicare Other | Source: Ambulatory Visit | Attending: Surgery | Admitting: Surgery

## 2012-08-16 ENCOUNTER — Encounter (HOSPITAL_COMMUNITY): Payer: Self-pay | Admitting: Anesthesiology

## 2012-08-16 DIAGNOSIS — Z902 Acquired absence of lung [part of]: Secondary | ICD-10-CM | POA: Insufficient documentation

## 2012-08-16 DIAGNOSIS — I10 Essential (primary) hypertension: Secondary | ICD-10-CM | POA: Insufficient documentation

## 2012-08-16 DIAGNOSIS — R011 Cardiac murmur, unspecified: Secondary | ICD-10-CM | POA: Insufficient documentation

## 2012-08-16 DIAGNOSIS — Z79899 Other long term (current) drug therapy: Secondary | ICD-10-CM | POA: Insufficient documentation

## 2012-08-16 DIAGNOSIS — C799 Secondary malignant neoplasm of unspecified site: Secondary | ICD-10-CM

## 2012-08-16 DIAGNOSIS — M129 Arthropathy, unspecified: Secondary | ICD-10-CM | POA: Insufficient documentation

## 2012-08-16 DIAGNOSIS — Z7982 Long term (current) use of aspirin: Secondary | ICD-10-CM | POA: Insufficient documentation

## 2012-08-16 DIAGNOSIS — R7309 Other abnormal glucose: Secondary | ICD-10-CM | POA: Insufficient documentation

## 2012-08-16 DIAGNOSIS — C3490 Malignant neoplasm of unspecified part of unspecified bronchus or lung: Secondary | ICD-10-CM

## 2012-08-16 DIAGNOSIS — Z8701 Personal history of pneumonia (recurrent): Secondary | ICD-10-CM | POA: Insufficient documentation

## 2012-08-16 DIAGNOSIS — I872 Venous insufficiency (chronic) (peripheral): Secondary | ICD-10-CM | POA: Insufficient documentation

## 2012-08-16 DIAGNOSIS — C384 Malignant neoplasm of pleura: Secondary | ICD-10-CM | POA: Insufficient documentation

## 2012-08-16 DIAGNOSIS — E785 Hyperlipidemia, unspecified: Secondary | ICD-10-CM | POA: Insufficient documentation

## 2012-08-16 HISTORY — PX: PORTACATH PLACEMENT: SHX2246

## 2012-08-16 LAB — PROTIME-INR: INR: 1.02 (ref 0.00–1.49)

## 2012-08-16 LAB — APTT: aPTT: 30 seconds (ref 24–37)

## 2012-08-16 SURGERY — INSERTION, TUNNELED CENTRAL VENOUS DEVICE, WITH PORT
Anesthesia: Monitor Anesthesia Care | Site: Chest | Laterality: Right | Wound class: Clean

## 2012-08-16 MED ORDER — PROMETHAZINE HCL 25 MG/ML IJ SOLN: 6.2500 mg | INTRAMUSCULAR | Status: DC | PRN

## 2012-08-16 MED ORDER — OXYCODONE HCL 5 MG/5ML PO SOLN: 5.0000 mg | Freq: Once | ORAL | Status: DC | PRN

## 2012-08-16 MED ORDER — LACTATED RINGERS IV SOLN: Freq: Once | INTRAVENOUS | Status: DC

## 2012-08-16 MED ORDER — OXYCODONE HCL 5 MG PO TABS: 5.0000 mg | ORAL_TABLET | ORAL | Status: DC | PRN

## 2012-08-16 MED ORDER — EPHEDRINE SULFATE 50 MG/ML IJ SOLN
INTRAMUSCULAR | Status: DC | PRN
  Administered 2012-08-16: 10 mg via INTRAVENOUS

## 2012-08-16 MED ORDER — OXYCODONE HCL 5 MG PO TABS: 5.0000 mg | ORAL_TABLET | Freq: Once | ORAL | Status: DC | PRN

## 2012-08-16 MED ORDER — CARVEDILOL 3.125 MG PO TABS
3.1250 mg | ORAL_TABLET | Freq: Once | ORAL | Status: AC
  Administered 2012-08-16: 3.125 mg via ORAL
  Filled 2012-08-16: qty 1

## 2012-08-16 MED ORDER — HEPARIN SODIUM (PORCINE) 1000 UNIT/ML DIALYSIS
INTRAMUSCULAR | Status: DC | PRN
  Administered 2012-08-16: 1000 [IU]

## 2012-08-16 MED ORDER — HYDROMORPHONE HCL PF 1 MG/ML IJ SOLN: 0.2500 mg | INTRAMUSCULAR | Status: DC | PRN

## 2012-08-16 MED ORDER — PROPOFOL 10 MG/ML IV BOLUS
INTRAVENOUS | Status: DC | PRN
  Administered 2012-08-16: 180 mg via INTRAVENOUS

## 2012-08-16 MED ORDER — LACTATED RINGERS IV SOLN
INTRAVENOUS | Status: DC | PRN
  Administered 2012-08-16: 10:00:00 via INTRAVENOUS

## 2012-08-16 MED ORDER — FENTANYL CITRATE 0.05 MG/ML IJ SOLN
INTRAMUSCULAR | Status: DC | PRN
  Administered 2012-08-16: 100 ug via INTRAVENOUS

## 2012-08-16 MED ORDER — LIDOCAINE HCL (CARDIAC) 20 MG/ML IV SOLN
INTRAVENOUS | Status: DC | PRN
  Administered 2012-08-16: 75 mg via INTRAVENOUS

## 2012-08-16 MED ORDER — SODIUM CHLORIDE 0.9 % IR SOLN
Status: DC | PRN
  Administered 2012-08-16: 11:00:00

## 2012-08-16 MED ORDER — HEPARIN SODIUM (PORCINE) 1000 UNIT/ML IJ SOLN
INTRAMUSCULAR | Status: AC
  Filled 2012-08-16: qty 1

## 2012-08-16 MED ORDER — CARVEDILOL 3.125 MG PO TABS
ORAL_TABLET | ORAL | Status: AC
  Filled 2012-08-16: qty 1

## 2012-08-16 MED ORDER — LIDOCAINE-EPINEPHRINE (PF) 1 %-1:200000 IJ SOLN
INTRAMUSCULAR | Status: AC
  Filled 2012-08-16: qty 10

## 2012-08-16 SURGICAL SUPPLY — 43 items
ADH SKN CLS APL DERMABOND .7 (GAUZE/BANDAGES/DRESSINGS)
ADH SKN CLS LQ APL DERMABOND (GAUZE/BANDAGES/DRESSINGS) ×1
BAG DECANTER FOR FLEXI CONT (MISCELLANEOUS) ×2 IMPLANT
BLADE SURG 15 STRL LF DISP TIS (BLADE) ×1 IMPLANT
BLADE SURG 15 STRL SS (BLADE) ×2
CANISTER SUCTION 2500CC (MISCELLANEOUS) ×2 IMPLANT
CLOTH BEACON ORANGE TIMEOUT ST (SAFETY) ×2 IMPLANT
COVER SURGICAL LIGHT HANDLE (MISCELLANEOUS) ×2 IMPLANT
DERMABOND ADHESIVE PROPEN (GAUZE/BANDAGES/DRESSINGS) ×1
DERMABOND ADVANCED (GAUZE/BANDAGES/DRESSINGS)
DERMABOND ADVANCED .7 DNX12 (GAUZE/BANDAGES/DRESSINGS) ×1 IMPLANT
DERMABOND ADVANCED .7 DNX6 (GAUZE/BANDAGES/DRESSINGS) IMPLANT
DRAPE C-ARM 42X72 X-RAY (DRAPES) ×2 IMPLANT
DRAPE CHEST BREAST 15X10 FENES (DRAPES) ×2 IMPLANT
DRAPE INCISE IOBAN 66X45 STRL (DRAPES) ×3 IMPLANT
ELECT CAUTERY BLADE 6.4 (BLADE) ×2 IMPLANT
ELECT REM PT RETURN 9FT ADLT (ELECTROSURGICAL) ×2
ELECTRODE REM PT RTRN 9FT ADLT (ELECTROSURGICAL) ×1 IMPLANT
GAUZE SPONGE 4X4 16PLY XRAY LF (GAUZE/BANDAGES/DRESSINGS) ×1 IMPLANT
GLOVE BIOGEL M 7.0 STRL (GLOVE) ×1 IMPLANT
GLOVE EUDERMIC 7 POWDERFREE (GLOVE) ×2 IMPLANT
GOWN PREVENTION PLUS XLARGE (GOWN DISPOSABLE) ×2 IMPLANT
GOWN STRL NON-REIN LRG LVL3 (GOWN DISPOSABLE) ×2 IMPLANT
KIT BASIN OR (CUSTOM PROCEDURE TRAY) ×2 IMPLANT
KIT PORT POWER 9.6FR MRI PREA (Catheter) ×1 IMPLANT
KIT PORT POWER ISP 8FR (Catheter) IMPLANT
KIT POWER CATH 8FR (Catheter) IMPLANT
KIT ROOM TURNOVER OR (KITS) ×2 IMPLANT
NEEDLE 22X1 1/2 (OR ONLY) (NEEDLE) ×2 IMPLANT
NS IRRIG 1000ML POUR BTL (IV SOLUTION) ×2 IMPLANT
PACK GENERAL/GYN (CUSTOM PROCEDURE TRAY) ×2 IMPLANT
PAD ARMBOARD 7.5X6 YLW CONV (MISCELLANEOUS) ×4 IMPLANT
SPONGE GAUZE 4X4 12PLY (GAUZE/BANDAGES/DRESSINGS) IMPLANT
SUT SILK 1 SH (SUTURE) ×1 IMPLANT
SUT SILK 2 0 SH (SUTURE) ×4 IMPLANT
SUT VIC AB 3-0 SH 27 (SUTURE) ×2
SUT VIC AB 3-0 SH 27X BRD (SUTURE) ×1 IMPLANT
SUT VIC AB 4-0 PS2 27 (SUTURE) ×2 IMPLANT
SYR 20CC LL (SYRINGE) ×2 IMPLANT
SYR CONTROL 10ML LL (SYRINGE) ×2 IMPLANT
TOWEL OR 17X24 6PK STRL BLUE (TOWEL DISPOSABLE) ×2 IMPLANT
TOWEL OR 17X26 10 PK STRL BLUE (TOWEL DISPOSABLE) ×2 IMPLANT
WATER STERILE IRR 1000ML POUR (IV SOLUTION) ×2 IMPLANT

## 2012-08-16 NOTE — Anesthesia Postprocedure Evaluation (Signed)
Anesthesia Post Note  Patient: [name]  Procedure(s) Performed: Procedure(s) (LRB): INSERTION PORT-A-CATH (Right)  Anesthesia type: general  Patient location: PACU  Post pain: Pain level controlled  Post assessment: Patient's Cardiovascular Status Stable  Post vital signs: Reviewed and stable  Level of consciousness: sedated  Complications: No apparent anesthesia complications
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
## 2012-08-16 NOTE — Brief Op Note (Signed)
08/16/2012  11:23 AM  PATIENT:  [name]  67 y.o. male  PRE-OPERATIVE DIAGNOSIS:  RECURRENT NON SMALL CELL LUNG CANCER  POST-OPERATIVE DIAGNOSIS:  RECURRENT NON SMALL CELL LUNG CANCER  PROCEDURE:  Procedure(s): INSERTION PORT-A-CATH (Right) subclavian vein  SURGEON:  Surgeon(s) and Role:    * Alleen Borne, MD - Primary  PHYSICIAN ASSISTANT: none   ASSISTANTS: none   ANESTHESIA:   general  EBL:  Total I/O In: 800 [I.V.:800] Out: -   BLOOD ADMINISTERED:none  DRAINS: none   LOCAL MEDICATIONS USED:  NONE  SPECIMEN:  No Specimen  DISPOSITION OF SPECIMEN:  N/A  COUNTS:  YES  TOURNIQUET:  * No tourniquets in log *  DICTATION: .Note written in EPIC  PLAN OF CARE: Discharge to home after PACU  PATIENT DISPOSITION:  PACU - hemodynamically stable.   Delay start of Pharmacological VTE agent (>24hrs) due to surgical blood loss or risk of bleeding: not applicable

## 2012-08-16 NOTE — Interval H&P Note (Signed)
History and Physical Interval Note:  08/16/2012 9:59 AM  [name]  has presented today for surgery, with the diagnosis of RECURRENT NON SMALL CELL LUNG CANCER  The various methods of treatment have been discussed with the patient and family. After consideration of risks, benefits and other options for treatment, the patient has consented to  Procedure(s): INSERTION PORT-A-CATH (Bilateral) as a surgical intervention .  The patient's history has been reviewed, patient examined, no change in status, stable for surgery.  I have reviewed the patient's chart and labs.  Questions were answered to the patient's satisfaction.     Alleen Borne
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
## 2012-08-16 NOTE — Transfer of Care (Signed)
Immediate Anesthesia Transfer of Care Note  Patient: [name]  Procedure(s) Performed: Procedure(s): INSERTION PORT-A-CATH (Right)  Patient Location: PACU  Anesthesia Type:General  Level of Consciousness: awake and alert   Airway & Oxygen Therapy: Patient Spontanous Breathing and Patient connected to nasal cannula oxygen  Post-op Assessment: Report given to PACU RN and Post -op Vital signs reviewed and stable  Post vital signs: Reviewed and stable  Complications: No apparent anesthesia complications

## 2012-08-16 NOTE — Anesthesia Preprocedure Evaluation (Addendum)
Anesthesia Evaluation  Patient identified by MRN, date of birth, ID band Patient awake    Reviewed: Allergy & Precautions, H&P , NPO status , Patient's Chart, lab work & pertinent test results  History of Anesthesia Complications Negative for: history of anesthetic complications  Airway Mallampati: II TM Distance: >3 FB Neck ROM: Full    Dental  (+) Edentulous Upper and Edentulous Lower   Pulmonary shortness of breath and with exertion, sleep apnea , pneumonia -, resolved, former smoker,  Lung Cancer   Pulmonary exam normal       Cardiovascular hypertension, Pt. on home beta blockers + Peripheral Vascular Disease  Echo Left ventricle: The cavity size was normal. There was mild   concentric hypertrophy. Systolic function was normal. The   estimated ejection fraction was in the range of 50% to   55%.   Neuro/Psych negative neurological ROS  negative psych ROS   GI/Hepatic negative GI ROS, Neg liver ROS,   Endo/Other  Morbid obesity  Renal/GU Renal InsufficiencyRenal disease     Musculoskeletal   Abdominal   Peds  Hematology   Anesthesia Other Findings   Reproductive/Obstetrics                         Anesthesia Physical Anesthesia Plan  ASA: III  Anesthesia Plan: General   Post-op Pain Management:    Induction:   Airway Management Planned: LMA  Additional Equipment:   Intra-op Plan:   Post-operative Plan: Extubation in OR  Informed Consent: I have reviewed the patients History and Physical, chart, labs and discussed the procedure including the risks, benefits and alternatives for the proposed anesthesia with the patient or authorized representative who has indicated his/her understanding and acceptance.   Dental advisory given  Plan Discussed with: CRNA, Anesthesiologist and Surgeon  Anesthesia Plan Comments:        Anesthesia Quick Evaluation

## 2012-08-16 NOTE — Op Note (Signed)
CARDIOVASCULAR SURGERY OPERATIVE NOTE  08/16/2012 Jesson Foskey [place] [id number]  Surgeon:  Alleen Borne, MD  First Assistant: none   Preoperative Diagnosis:  Recurrent adenocarcinoma of the lung  Postoperative Diagnosis:  Same   Procedure: Insertion of right subclavian portacath.   Anesthesia:  General LMA   Clinical History/Surgical Indication:  The patient is a 67 year old gentleman who has recurrent adenocarcinoma of the lung s/p left lower lobectomy 01/04/2012 for stage IIB disease that was negative for EGFR and ALK. He has developed worsening shortness of breath. He requires a Port-A-Cath for chemotherapy. I discussed the operative procedure with the patient including alternatives, benefits, and risks including but not limited to leading, infection, injury to the subclavian vessels, pneumothorax, malfunction of the Port-A-Cath requiring removal, and venous thrombosis. He understood and agreed to proceed.  Preparation:  The patient was seen in the preoperative holding area and the correct patient, correct operation were confirmed with the patient after reviewing the medical record. The consent was signed by me. The right infraclavicular region was signed by me. Preoperative antibiotics were given.  The patient was taken back to the operating room and positioned supine on the operating room table. The patient wasplaced under general anesthesia by the anesthesia team. The neck and chest were prepped with betadine soap and solution and draped in the usual sterile manner. A surgical time-out was taken and the correct patient and operative procedure were confirmed with the nursing and anesthesia staff.   Insertion of Portacath:   A 3 cm incision was made transversely in the right infraclavicular region and carried down through the subcutaneous tissue with electrocautery. A subcutaneous pocket was developed inferior to the incision directly over the pectoralis fascia. The right  subclavian vein was cannulated with a needle and a guidewire advanced into the right side of the heart using fluoroscopic guidance. A 10 French introducer and sheath was inserted over the guidewire under fluoroscopic guidance. A 9.6 [employer] with a pre-attached catheter was cut to a 20 cm length. The introducer and guidewire were moved and the catheter was inserted through the sheath under fluoroscopic guidance until the tip was in the superior vena cava. The peel-away sheath was removed. The port was accessed with the [facility] needle and there was good blood return. It was flushed with heparinized saline solution and then a final flush of 4 cc heparin solution at 1000 units per cc. The port was placed in the subcutaneous pocket and anchored to the pectoralis fascia with two 2-0 silk sutures. There was complete hemostasis. The subcutaneous tissue was closed with continuous 3-0 Vicryl suture. The skin was closed with a 4-0 Vicryl subcuticular stitch. Dermabond was applied over the incision. The sponge needle and instrument counts were correct according scrub nurse. The patient was awakened and transported to the post anesthesia care unit in satisfactory and stable condition.

## 2012-08-16 NOTE — Anesthesia Procedure Notes (Signed)
Procedure Name: LMA Insertion Date/Time: 08/16/2012 10:10 AM Performed by: Gwenyth Allegra Pre-anesthesia Checklist: Patient identified, Timeout performed, Emergency Drugs available, Suction available and Patient being monitored Intubation Type: IV induction LMA: LMA with gastric port inserted Number of attempts: 1 Placement Confirmation: positive ETCO2 and breath sounds checked- equal and bilateral Tube secured with: Tape

## 2012-08-18 LAB — AFB CULTURE WITH SMEAR (NOT AT ARMC)
Acid Fast Smear: NONE SEEN
Special Requests: NORMAL

## 2012-08-19 ENCOUNTER — Encounter (HOSPITAL_COMMUNITY): Payer: Self-pay | Admitting: Surgery

## 2012-08-20 ENCOUNTER — Other Ambulatory Visit (HOSPITAL_BASED_OUTPATIENT_CLINIC_OR_DEPARTMENT_OTHER): Payer: Medicare Other

## 2012-08-20 DIAGNOSIS — C3490 Malignant neoplasm of unspecified part of unspecified bronchus or lung: Secondary | ICD-10-CM

## 2012-08-20 DIAGNOSIS — C3492 Malignant neoplasm of unspecified part of left bronchus or lung: Secondary | ICD-10-CM

## 2012-08-20 LAB — CBC WITH DIFFERENTIAL/PLATELET
Basophils Absolute: 0 10*3/uL (ref 0.0–0.1)
Eosinophils Absolute: 0.2 10*3/uL (ref 0.0–0.5)
HCT: 36.8 % — ABNORMAL LOW (ref 38.4–49.9)
LYMPH%: 15.7 % (ref 14.0–49.0)
MONO#: 0.3 10*3/uL (ref 0.1–0.9)
NEUT#: 3.6 10*3/uL (ref 1.5–6.5)
NEUT%: 73.9 % (ref 39.0–75.0)
Platelets: 165 10*3/uL (ref 140–400)
WBC: 4.9 10*3/uL (ref 4.0–10.3)

## 2012-08-20 LAB — COMPREHENSIVE METABOLIC PANEL (CC13)
BUN: 18 mg/dL (ref 7.0–26.0)
CO2: 25 mEq/L (ref 22–29)
Creatinine: 1.2 mg/dL (ref 0.7–1.3)
Glucose: 118 mg/dl (ref 70–140)
Total Bilirubin: 0.46 mg/dL (ref 0.20–1.20)
Total Protein: 7.9 g/dL (ref 6.4–8.3)

## 2012-08-27 ENCOUNTER — Other Ambulatory Visit: Payer: Medicare Other | Admitting: Lab

## 2012-08-27 ENCOUNTER — Inpatient Hospital Stay (HOSPITAL_COMMUNITY)
Admission: EM | Admit: 2012-08-27 | Discharge: 2012-09-01 | DRG: 292 | Disposition: A | Discharge status: Home / Self Care | Payer: Medicare Other | Attending: Cardiology | Admitting: Cardiology

## 2012-08-27 ENCOUNTER — Ambulatory Visit (HOSPITAL_COMMUNITY): Admission: RE | Admit: 2012-08-27 | Payer: Medicare Other | Source: Ambulatory Visit

## 2012-08-27 ENCOUNTER — Encounter: Payer: Self-pay | Admitting: Internal Medicine

## 2012-08-27 ENCOUNTER — Ambulatory Visit (HOSPITAL_BASED_OUTPATIENT_CLINIC_OR_DEPARTMENT_OTHER): Payer: Medicare Other | Admitting: Internal Medicine

## 2012-08-27 ENCOUNTER — Emergency Department (HOSPITAL_COMMUNITY): Payer: Medicare Other

## 2012-08-27 ENCOUNTER — Encounter (HOSPITAL_COMMUNITY): Payer: Self-pay

## 2012-08-27 ENCOUNTER — Other Ambulatory Visit (HOSPITAL_BASED_OUTPATIENT_CLINIC_OR_DEPARTMENT_OTHER): Payer: Medicare Other | Admitting: Lab

## 2012-08-27 DIAGNOSIS — E662 Morbid (severe) obesity with alveolar hypoventilation: Secondary | ICD-10-CM | POA: Diagnosis present

## 2012-08-27 DIAGNOSIS — I9589 Other hypotension: Secondary | ICD-10-CM | POA: Diagnosis present

## 2012-08-27 DIAGNOSIS — Z902 Acquired absence of lung [part of]: Secondary | ICD-10-CM

## 2012-08-27 DIAGNOSIS — M129 Arthropathy, unspecified: Secondary | ICD-10-CM | POA: Diagnosis present

## 2012-08-27 DIAGNOSIS — Z79899 Other long term (current) drug therapy: Secondary | ICD-10-CM

## 2012-08-27 DIAGNOSIS — Z87891 Personal history of nicotine dependence: Secondary | ICD-10-CM

## 2012-08-27 DIAGNOSIS — Z7982 Long term (current) use of aspirin: Secondary | ICD-10-CM

## 2012-08-27 DIAGNOSIS — G4733 Obstructive sleep apnea (adult) (pediatric): Secondary | ICD-10-CM | POA: Diagnosis present

## 2012-08-27 DIAGNOSIS — N182 Chronic kidney disease, stage 2 (mild): Secondary | ICD-10-CM | POA: Diagnosis present

## 2012-08-27 DIAGNOSIS — I5023 Acute on chronic systolic (congestive) heart failure: Principal | ICD-10-CM | POA: Diagnosis present

## 2012-08-27 DIAGNOSIS — J449 Chronic obstructive pulmonary disease, unspecified: Secondary | ICD-10-CM | POA: Diagnosis present

## 2012-08-27 DIAGNOSIS — E785 Hyperlipidemia, unspecified: Secondary | ICD-10-CM | POA: Diagnosis present

## 2012-08-27 DIAGNOSIS — R0902 Hypoxemia: Secondary | ICD-10-CM

## 2012-08-27 DIAGNOSIS — E739 Lactose intolerance, unspecified: Secondary | ICD-10-CM | POA: Diagnosis present

## 2012-08-27 DIAGNOSIS — J4489 Other specified chronic obstructive pulmonary disease: Secondary | ICD-10-CM | POA: Diagnosis present

## 2012-08-27 DIAGNOSIS — I509 Heart failure, unspecified: Secondary | ICD-10-CM | POA: Diagnosis present

## 2012-08-27 DIAGNOSIS — C3490 Malignant neoplasm of unspecified part of unspecified bronchus or lung: Secondary | ICD-10-CM | POA: Diagnosis present

## 2012-08-27 DIAGNOSIS — I872 Venous insufficiency (chronic) (peripheral): Secondary | ICD-10-CM | POA: Diagnosis present

## 2012-08-27 DIAGNOSIS — Z6841 Body Mass Index (BMI) 40.0 and over, adult: Secondary | ICD-10-CM

## 2012-08-27 DIAGNOSIS — I2789 Other specified pulmonary heart diseases: Secondary | ICD-10-CM | POA: Diagnosis present

## 2012-08-27 DIAGNOSIS — I130 Hypertensive heart and chronic kidney disease with heart failure and stage 1 through stage 4 chronic kidney disease, or unspecified chronic kidney disease: Secondary | ICD-10-CM | POA: Diagnosis present

## 2012-08-27 LAB — COMPREHENSIVE METABOLIC PANEL
ALT: 13 U/L (ref 0–53)
AST: 24 U/L (ref 0–37)
Calcium: 9.6 mg/dL (ref 8.4–10.5)
GFR calc Af Amer: 61 mL/min — ABNORMAL LOW (ref >90)
Glucose, Bld: 101 mg/dL — ABNORMAL HIGH (ref 70–99)
Sodium: 139 mEq/L (ref 135–145)
Total Protein: 7.5 g/dL (ref 6.0–8.3)

## 2012-08-27 LAB — CBC WITH DIFFERENTIAL/PLATELET
Basophils Absolute: 0 10*3/uL (ref 0.0–0.1)
Eosinophils Absolute: 0 10*3/uL (ref 0.0–0.5)
Eosinophils Absolute: 0.1 10*3/uL (ref 0.0–0.7)
HCT: 39.3 % (ref 38.4–49.9)
LYMPH%: 16.8 % (ref 14.0–49.0)
Lymphocytes Relative: 22 % (ref 12–46)
MCHC: 31.9 g/dL (ref 30.0–36.0)
MONO#: 0.6 10*3/uL (ref 0.1–0.9)
NEUT#: 4.8 10*3/uL (ref 1.5–6.5)
Neutrophils Relative %: 68 % (ref 43–77)
Platelets: 109 10*3/uL — ABNORMAL LOW (ref 140–400)
RBC: 4.06 10*6/uL — ABNORMAL LOW (ref 4.20–5.82)
RDW: 15.7 % — ABNORMAL HIGH (ref 11.5–15.5)
WBC: 6.5 10*3/uL (ref 4.0–10.3)
nRBC: 2 % — ABNORMAL HIGH (ref 0–0)

## 2012-08-27 LAB — COMPREHENSIVE METABOLIC PANEL (CC13)
ALT: 12 U/L (ref 0–55)
Albumin: 3.3 g/dL — ABNORMAL LOW (ref 3.5–5.0)
CO2: 22 mEq/L (ref 22–29)
Calcium: 9.2 mg/dL (ref 8.4–10.4)
Chloride: 110 mEq/L — ABNORMAL HIGH (ref 98–109)
Creatinine: 1.3 mg/dL (ref 0.7–1.3)
Total Protein: 7.5 g/dL (ref 6.4–8.3)

## 2012-08-27 LAB — PROTIME-INR: INR: 1.07 (ref 0.00–1.49)

## 2012-08-27 LAB — MAGNESIUM: Magnesium: 1.8 mg/dL (ref 1.5–2.5)

## 2012-08-27 LAB — PRO B NATRIURETIC PEPTIDE: Pro B Natriuretic peptide (BNP): 3381 pg/mL — ABNORMAL HIGH (ref 0–125)

## 2012-08-27 LAB — TROPONIN I: Troponin I: <0.3 ng/mL (ref <0.30)

## 2012-08-27 LAB — APTT: aPTT: 32 seconds (ref 24–37)

## 2012-08-27 MED ORDER — SODIUM CHLORIDE 0.9 % IJ SOLN: 3.0000 mL | INTRAMUSCULAR | Status: DC | PRN

## 2012-08-27 MED ORDER — HEPARIN SODIUM (PORCINE) 5000 UNIT/ML IJ SOLN
5000.0000 [IU] | Freq: Three times a day (TID) | INTRAMUSCULAR | Status: DC
  Administered 2012-08-27 – 2012-09-01 (×14): 5000 [IU] via SUBCUTANEOUS
  Filled 2012-08-27 (×17): qty 1

## 2012-08-27 MED ORDER — ONDANSETRON HCL 4 MG/2ML IJ SOLN: 4.0000 mg | Freq: Four times a day (QID) | INTRAMUSCULAR | Status: DC | PRN

## 2012-08-27 MED ORDER — SODIUM CHLORIDE 0.9 % IV SOLN: 250.0000 mL | INTRAVENOUS | Status: DC | PRN

## 2012-08-27 MED ORDER — CARVEDILOL 3.125 MG PO TABS
3.1250 mg | ORAL_TABLET | Freq: Two times a day (BID) | ORAL | Status: DC
  Administered 2012-08-28 – 2012-09-01 (×7): 3.125 mg via ORAL
  Filled 2012-08-27 (×11): qty 1

## 2012-08-27 MED ORDER — SODIUM CHLORIDE 0.9 % IJ SOLN
3.0000 mL | Freq: Two times a day (BID) | INTRAMUSCULAR | Status: DC
  Administered 2012-08-27 – 2012-08-31 (×8): 3 mL via INTRAVENOUS

## 2012-08-27 MED ORDER — ASPIRIN 81 MG PO CHEW
324.0000 mg | CHEWABLE_TABLET | Freq: Once | ORAL | Status: AC
  Administered 2012-08-27: 324 mg via ORAL
  Filled 2012-08-27: qty 4

## 2012-08-27 MED ORDER — FUROSEMIDE 10 MG/ML IJ SOLN
40.0000 mg | Freq: Once | INTRAMUSCULAR | Status: AC
Start: 2012-08-27 — End: 2012-08-27
  Administered 2012-08-27: 40 mg via INTRAVENOUS
  Filled 2012-08-27: qty 4

## 2012-08-27 MED ORDER — DIGOXIN 125 MCG PO TABS
0.1250 mg | ORAL_TABLET | Freq: Every day | ORAL | Status: DC
  Administered 2012-08-28 – 2012-09-01 (×5): 0.125 mg via ORAL
  Filled 2012-08-27 (×5): qty 1

## 2012-08-27 MED ORDER — RAMIPRIL 2.5 MG PO CAPS
2.5000 mg | ORAL_CAPSULE | Freq: Every day | ORAL | Status: DC
  Administered 2012-08-28 – 2012-09-01 (×3): 2.5 mg via ORAL
  Filled 2012-08-27 (×5): qty 1

## 2012-08-27 MED ORDER — ASPIRIN EC 81 MG PO TBEC: 81.0000 mg | DELAYED_RELEASE_TABLET | Freq: Every day | ORAL | Status: DC

## 2012-08-27 MED ORDER — ATORVASTATIN CALCIUM 40 MG PO TABS
40.0000 mg | ORAL_TABLET | Freq: Every day | ORAL | Status: DC
  Administered 2012-08-28 – 2012-08-31 (×4): 40 mg via ORAL
  Filled 2012-08-27 (×5): qty 1

## 2012-08-27 MED ORDER — POTASSIUM CHLORIDE CRYS ER 20 MEQ PO TBCR
20.0000 meq | EXTENDED_RELEASE_TABLET | Freq: Two times a day (BID) | ORAL | Status: DC
  Administered 2012-08-27 – 2012-08-29 (×4): 20 meq via ORAL
  Filled 2012-08-27 (×5): qty 1

## 2012-08-27 MED ORDER — FUROSEMIDE 10 MG/ML IJ SOLN
80.0000 mg | Freq: Two times a day (BID) | INTRAMUSCULAR | Status: DC
  Administered 2012-08-27 – 2012-08-29 (×4): 80 mg via INTRAVENOUS
  Filled 2012-08-27 (×7): qty 8

## 2012-08-27 MED ORDER — LIDOCAINE-PRILOCAINE 2.5-2.5 % EX CREA
1.0000 "application " | TOPICAL_CREAM | CUTANEOUS | Status: DC | PRN
  Filled 2012-08-27: qty 5

## 2012-08-27 MED ORDER — ASPIRIN EC 81 MG PO TBEC
81.0000 mg | DELAYED_RELEASE_TABLET | Freq: Every day | ORAL | Status: DC
  Administered 2012-08-28 – 2012-09-01 (×5): 81 mg via ORAL
  Filled 2012-08-27 (×5): qty 1

## 2012-08-27 MED ORDER — ACETAMINOPHEN 325 MG PO TABS: 650.0000 mg | ORAL_TABLET | ORAL | Status: DC | PRN

## 2012-08-27 MED ORDER — FUROSEMIDE 10 MG/ML IJ SOLN
40.0000 mg | Freq: Once | INTRAMUSCULAR | Status: AC
  Administered 2012-08-27: 40 mg via INTRAVENOUS
  Filled 2012-08-27: qty 4
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
## 2012-08-27 NOTE — Progress Notes (Signed)
O2 saturation at rest: 95% O2 saturation while ambulating on room air: 76% O2 saturation on rest on 2L: 83% O2 saturation on rest on 3L: 92%

## 2012-08-27 NOTE — ED Notes (Signed)
Pt to xray
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
## 2012-08-27 NOTE — ED Notes (Signed)
Report given to [place], rn on floor

## 2012-08-27 NOTE — ED Notes (Signed)
Pt sent over by Dr. Denny Levy d/t low sats, o2 sat 82-85, pt c/o increase SOB on exertion x3 days

## 2012-08-27 NOTE — ED Notes (Signed)
md at bedside

## 2012-08-27 NOTE — Patient Instructions (Signed)
You have significant dyspnea that could be secondary to pulmonary embolism versus congestive heart failure versus disease progression. I recommended for you to go to the emergency department immediately for further evaluation.
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
## 2012-08-27 NOTE — Progress Notes (Signed)
Patient does not want to sign up for My Chart at present time. Briscoe Burns BSN, RN-BC Admissions RN  08/27/2012 4:08 PM

## 2012-08-28 ENCOUNTER — Telehealth: Payer: Self-pay | Admitting: *Deleted

## 2012-08-28 ENCOUNTER — Telehealth: Payer: Self-pay | Admitting: Internal Medicine

## 2012-08-28 ENCOUNTER — Ambulatory Visit: Payer: Medicare Other | Admitting: Internal Medicine

## 2012-08-28 LAB — PRO B NATRIURETIC PEPTIDE: Pro B Natriuretic peptide (BNP): 2455 pg/mL — ABNORMAL HIGH (ref 0–125)

## 2012-08-28 LAB — BASIC METABOLIC PANEL
BUN: 17 mg/dL (ref 6–23)
Creatinine, Ser: 1.48 mg/dL — ABNORMAL HIGH (ref 0.50–1.35)
GFR calc Af Amer: 55 mL/min — ABNORMAL LOW (ref >90)
GFR calc non Af Amer: 48 mL/min — ABNORMAL LOW (ref >90)
Potassium: 4.1 mEq/L (ref 3.5–5.1)

## 2012-08-28 LAB — TSH: TSH: 1.711 u[IU]/mL (ref 0.350–4.500)

## 2012-08-28 LAB — TROPONIN I: Troponin I: <0.3 ng/mL

## 2012-08-28 NOTE — Telephone Encounter (Signed)
s.w. pt and advised on 8.5.14 appts.Marland Kitchen.he will pick up updated sched at nxt visit

## 2012-08-28 NOTE — Progress Notes (Signed)
Subjective:  Patient denies any chest pain but continues to complain of shortness of breath. Diuresing well lost approximately 4 kg since yesterday  Objective:  Vital Signs in the last 24 hours: Temp:  [97.8 F (36.6 C)-98.1 F (36.7 C)] 97.9 F (36.6 C) (07/30 1300) Pulse Rate:  [61-71] 71 (07/30 1300) Resp:  [17-18] 17 (07/30 1300) BP: (102-119)/(50-58) 119/58 mmHg (07/30 1300) SpO2:  [98 %-100 %] 99 % (07/30 1300) Weight:  [144.6 kg (318 lb 12.6 oz)] 144.6 kg (318 lb 12.6 oz) (07/30 0450)  Intake/Output from previous day: 07/29 0701 - 07/30 0700 In: 120 [P.O.:120] Out: 3025 [Urine:3025] Intake/Output from this shift: Total I/O In: 120 [P.O.:120] Out: 900 [Urine:900]  Physical Exam: Neck: no adenopathy, no carotid bruit, no JVD and supple, symmetrical, trachea midline Lungs: Decreased breath sound at bases with bibasilar rales Heart: regular rate and rhythm, S1, S2 normal and Soft systolic murmur and S3 gallop noted Abdomen: soft, non-tender; bowel sounds normal; no masses,  no organomegaly Extremities: No clubbing cyanosis 2+ edema left more than right  Lab Results:  Recent Labs  08/27/12 1108 08/27/12 1845  WBC 6.5 7.4  HGB 12.5* 13.0  PLT 109 confirmed both analyzers* 133*    Recent Labs  08/27/12 1845 08/28/12 0645  NA 139 137  K 4.3 4.1  CL 103 100  CO2 27 27  GLUCOSE 101* 108*  BUN 16 17  CREATININE 1.37* 1.48*    Recent Labs  08/27/12 2353 08/28/12 0645  TROPONINI <0.30 <0.30   Hepatic Function Panel  Recent Labs  08/27/12 1845  PROT 7.5  ALBUMIN 3.5  AST 24  ALT 13  ALKPHOS 99  BILITOT 0.6   No results found for this basename: CHOL,  in the last 72 hours No results found for this basename: PROTIME,  in the last 72 hours  Imaging: Imaging results have been reviewed and Dg Chest 2 View  08/27/2012   *RADIOLOGY REPORT*  Clinical Data: Shortness of breath, history of lung cancer  CHEST - 2 VIEW  Comparison: August 16, 2012  Findings:  There is pulmonary edema.  There is consolidation left lung base unchanged.  The heart size is enlarged.  The aorta is tortuous.  A right central venous line is identified unchanged. The soft tissues and osseous structures are stable.  IMPRESSION: Persistent pulmonary edema.  Persistent consolidation of left lung base, suspicious for pneumonia.   Original Report Authenticated By: Sherian Rein, M.D.    Cardiac Studies:  Assessment/Plan:  Resolving Acute on chronic decompensated systolic heart failure  Hypertensive heart disease with systolic dysfunction  Recurrent non-small cell CA of left lung  COPD with pulmonary hypertension  Probable obstructive sleep apnea/obesity hypoventilation syndrome  Morbid obesity  Remote tobacco abuse  Glucose intolerance  Chronic kidney disease stage II Plan Continue present management Monitor renal function closely may need to DC ACE if renal function deteriorates  LOS: 1 day    Donnalynn Wheeless N 08/28/2012, 6:06 PM

## 2012-08-28 NOTE — Telephone Encounter (Signed)
Per staff message and POF I have scheduled appts.  JMW  

## 2012-08-29 ENCOUNTER — Inpatient Hospital Stay (HOSPITAL_COMMUNITY): Payer: Medicare Other

## 2012-08-29 LAB — BASIC METABOLIC PANEL
CO2: 28 mEq/L (ref 19–32)
Calcium: 8.9 mg/dL (ref 8.4–10.5)
Chloride: 100 mEq/L (ref 96–112)
Glucose, Bld: 101 mg/dL — ABNORMAL HIGH (ref 70–99)
Sodium: 139 mEq/L (ref 135–145)

## 2012-08-29 MED ORDER — POTASSIUM CHLORIDE CRYS ER 20 MEQ PO TBCR
20.0000 meq | EXTENDED_RELEASE_TABLET | Freq: Every day | ORAL | Status: DC
  Administered 2012-08-30 – 2012-09-01 (×3): 20 meq via ORAL
  Filled 2012-08-29 (×3): qty 1

## 2012-08-29 MED ORDER — METOLAZONE 5 MG PO TABS
5.0000 mg | ORAL_TABLET | Freq: Every day | ORAL | Status: DC
  Administered 2012-08-29 – 2012-08-31 (×3): 5 mg via ORAL
  Filled 2012-08-29 (×3): qty 1

## 2012-08-29 MED ORDER — FUROSEMIDE 10 MG/ML IJ SOLN
40.0000 mg | Freq: Two times a day (BID) | INTRAMUSCULAR | Status: DC
  Administered 2012-08-29 – 2012-09-01 (×6): 40 mg via INTRAVENOUS
  Filled 2012-08-29 (×7): qty 4

## 2012-08-29 NOTE — Progress Notes (Signed)
Subjective:  Patient denies any chest pain states breathing is slightly improved but urine output has reduced since decreasing dose of Lasix Objective:  Vital Signs in the last 24 hours: Temp:  [97.9 F (36.6 C)-98.1 F (36.7 C)] 98.1 F (36.7 C) (07/31 1300) Pulse Rate:  [60-78] 70 (07/31 1300) Resp:  [16-18] 16 (07/31 1300) BP: (100-108)/(51-72) 108/72 mmHg (07/31 1300) SpO2:  [97 %-98 %] 98 % (07/31 1300) Weight:  [144.108 kg (317 lb 11.2 oz)] 144.108 kg (317 lb 11.2 oz) (07/31 0500)  Intake/Output from previous day: 07/30 0701 - 07/31 0700 In: 360 [P.O.:360] Out: 2200 [Urine:2200] Intake/Output from this shift: Total I/O In: 240 [P.O.:240] Out: 950 [Urine:950]  Physical Exam: Neck: no adenopathy, no carotid bruit, no JVD and supple, symmetrical, trachea midline Lungs: Decreased breath sound at bases air entry improved Heart: regular rate and rhythm, S1, S2 normal and Soft systolic murmur and S3 gallop noted Abdomen: soft, non-tender; bowel sounds normal; no masses,  no organomegaly Extremities: No clubbing cyanosis 1+ edema left more than right  Lab Results:  Recent Labs  08/27/12 1108 08/27/12 1845  WBC 6.5 7.4  HGB 12.5* 13.0  PLT 109 confirmed both analyzers* 133*    Recent Labs  08/28/12 0645 08/29/12 0430  NA 137 139  K 4.1 4.1  CL 100 100  CO2 27 28  GLUCOSE 108* 101*  BUN 17 20  CREATININE 1.48* 1.52*    Recent Labs  08/27/12 2353 08/28/12 0645  TROPONINI <0.30 <0.30   Hepatic Function Panel  Recent Labs  08/27/12 1845  PROT 7.5  ALBUMIN 3.5  AST 24  ALT 13  ALKPHOS 99  BILITOT 0.6   No results found for this basename: CHOL,  in the last 72 hours No results found for this basename: PROTIME,  in the last 72 hours  Imaging: Imaging results have been reviewed and Dg Chest 2 View  08/29/2012   *RADIOLOGY REPORT*  Clinical Data: Congestive heart failure; shortness of breath  CHEST - 2 VIEW  Comparison:  August 27, 2012  Findings:  Interstitial edema with patchy consolidation in the left base remains without change.  No new opacity.  Heart is prominent with normal pulmonary vascularity, stable.  No pneumothorax.  No adenopathy.  Port-A-Cath tip is in the superior vena cava.  IMPRESSION: Stable edema.  Stable mild consolidation left base.  No new opacity.   Original Report Authenticated By: Bretta Bang, M.D.    Cardiac Studies:  Assessment/Plan:  Resolving Acute on chronic decompensated systolic heart failure  Hypertensive heart disease with systolic dysfunction  Recurrent non-small cell CA of left lung  COPD with pulmonary hypertension  Probable obstructive sleep apnea/obesity hypoventilation syndrome  Morbid obesity  Remote tobacco abuse  Glucose intolerance  Chronic kidney disease stage II Plan Decrease IV Lasix And Zaroxolyn Check labs in a.m. Increase ambulation  LOS: 2 days    [name] 08/29/2012, 5:05 PM

## 2012-08-30 LAB — BASIC METABOLIC PANEL
BUN: 24 mg/dL — ABNORMAL HIGH (ref 6–23)
BUN: 24 mg/dL — ABNORMAL HIGH (ref 6–23)
CO2: 29 mEq/L (ref 19–32)
Calcium: 8.8 mg/dL (ref 8.4–10.5)
Calcium: 8.9 mg/dL (ref 8.4–10.5)
Creatinine, Ser: 1.54 mg/dL — ABNORMAL HIGH (ref 0.50–1.35)
Creatinine, Ser: 1.44 mg/dL — ABNORMAL HIGH (ref 0.50–1.35)
GFR calc non Af Amer: 49 mL/min — ABNORMAL LOW (ref >90)
Glucose, Bld: 99 mg/dL (ref 70–99)
Glucose, Bld: 104 mg/dL — ABNORMAL HIGH (ref 70–99)
Potassium: 4.2 mEq/L (ref 3.5–5.1)

## 2012-08-30 LAB — CBC
MCH: 30.5 pg (ref 26.0–34.0)
MCHC: 31.3 g/dL (ref 30.0–36.0)
MCV: 97.6 fL (ref 78.0–100.0)
Platelets: 192 10*3/uL (ref 150–400)
RDW: 16 % — ABNORMAL HIGH (ref 11.5–15.5)

## 2012-08-30 MED ORDER — SODIUM CHLORIDE 0.9 % IJ SOLN: 10.0000 mL | INTRAMUSCULAR | Status: DC | PRN

## 2012-08-30 NOTE — Progress Notes (Signed)
Pt BP has been trending down. This morning BP was 99/63. Paged Dr. Sharyn Lull to ask about giving BP meds.

## 2012-08-30 NOTE — Progress Notes (Signed)
Subjective:  Patient denies any chest pain states breathing slowly improving. Had episode of hypotension earlier this morning Coreg and ramipril where held. BNP trending down  Objective:  Vital Signs in the last 24 hours: Temp:  [97.3 F (36.3 C)-98.1 F (36.7 C)] 97.3 F (36.3 C) (08/01 0507) Pulse Rate:  [62-70] 68 (08/01 1114) Resp:  [16] 16 (08/01 0507) BP: (98-110)/(52-72) 98/58 mmHg (08/01 1114) SpO2:  [98 %-100 %] 100 % (08/01 0507) Weight:  [144.425 kg (318 lb 6.4 oz)] 144.425 kg (318 lb 6.4 oz) (08/01 0507)  Intake/Output from previous day: 07/31 0701 - 08/01 0700 In: 543 [P.O.:540; I.V.:3] Out: 1775 [Urine:1775] Intake/Output from this shift: Total I/O In: -  Out: 1000 [Urine:1000]  Physical Exam: Neck: no adenopathy, no carotid bruit, no JVD and supple, symmetrical, trachea midline Lungs: Decreased breath sound at bases air entry improved Heart: regular rate and rhythm, S1, S2 normal and Soft systolic murmur and S3 gallop noted Abdomen: soft, non-tender; bowel sounds normal; no masses,  no organomegaly Extremities: No clubbing cyanosis 1+ edema left more than the right  Lab Results:  Recent Labs  08/27/12 1845 08/30/12 0420  WBC 7.4 5.3  HGB 13.0 11.6*  PLT 133* 192    Recent Labs  08/29/12 0430 08/30/12 0420  NA 139 137  K 4.1 4.0  CL 100 97  CO2 28 29  GLUCOSE 101* 99  BUN 20 24*  CREATININE 1.52* 1.54*    Recent Labs  08/27/12 2353 08/28/12 0645  TROPONINI <0.30 <0.30   Hepatic Function Panel  Recent Labs  08/27/12 1845  PROT 7.5  ALBUMIN 3.5  AST 24  ALT 13  ALKPHOS 99  BILITOT 0.6   No results found for this basename: CHOL,  in the last 72 hours No results found for this basename: PROTIME,  in the last 72 hours  Imaging: Imaging results have been reviewed and Dg Chest 2 View  08/29/2012   *RADIOLOGY REPORT*  Clinical Data: Congestive heart failure; shortness of breath  CHEST - 2 VIEW  Comparison:  August 27, 2012  Findings:  Interstitial edema with patchy consolidation in the left base remains without change.  No new opacity.  Heart is prominent with normal pulmonary vascularity, stable.  No pneumothorax.  No adenopathy.  Port-A-Cath tip is in the superior vena cava.  IMPRESSION: Stable edema.  Stable mild consolidation left base.  No new opacity.   Original Report Authenticated By: Bretta Bang, M.D.    Cardiac Studies:  Assessment/Plan:  Resolving Acute on chronic decompensated systolic heart failure  Hypertensive heart disease with systolic dysfunction  Status post hypotension secondary to medications Recurrent non-small cell CA of left lung  COPD with pulmonary hypertension  Probable obstructive sleep apnea/obesity hypoventilation syndrome  Morbid obesity  Remote tobacco abuse  Glucose intolerance  Chronic kidney disease stage II  Plan Continue present management Possible discharge tomorrow stable Dr. Algie Coffer on call for me for weekend  LOS: 3 days    [name] N 08/30/2012, 11:56 AM

## 2012-08-30 NOTE — Progress Notes (Signed)
Spoke with pt concerning HH needs.  Pt states that he is not home bound and thereof can not have HH. Pt is correct.

## 2012-08-31 MED ORDER — METOLAZONE 5 MG PO TABS
5.0000 mg | ORAL_TABLET | ORAL | Status: DC
  Filled 2012-08-31 (×3): qty 1

## 2012-08-31 NOTE — Progress Notes (Signed)
Subjective:  Feeling better. Needs to ambulate.  Objective:  Vital Signs in the last 24 hours: Temp:  [97.7 F (36.5 C)-98.4 F (36.9 C)] 98.4 F (36.9 C) (08/02 0635) Pulse Rate:  [61-76] 76 (08/02 0635) Cardiac Rhythm:  [-] Normal sinus rhythm (08/02 0830) Resp:  [15-18] 18 (08/02 0635) BP: (98-135)/(53-70) 135/56 mmHg (08/02 0635) SpO2:  [98 %-100 %] 100 % (08/02 0635) Weight:  [143.6 kg (316 lb 9.3 oz)] 143.6 kg (316 lb 9.3 oz) (08/02 1610)  Physical Exam: BP Readings from Last 1 Encounters:  08/31/12 135/56     Wt Readings from Last 1 Encounters:  08/31/12 143.6 kg (316 lb 9.3 oz)    Weight change: -0.825 kg (-1 lb 13.1 oz)  HEENT: [place]/AT, Eyes-Brown, PERL, EOMI, Conjunctiva-Pale pink, Sclera-Non-icteric Neck: No JVD, No bruit, Trachea midline. Lungs:  Coarse crackles all over. Cardiac:  Regular rhythm, normal S1 and S2, no S3.  Abdomen:  Soft, non-tender. Extremities:  1 + edema present. No cyanosis. No clubbing. CNS: AxOx3, Cranial nerves grossly intact, moves all 4 extremities. Right handed. Skin: Warm and dry.   Intake/Output from previous day: 08/01 0701 - 08/02 0700 In: 240 [P.O.:240] Out: 2575 [Urine:2575]    Lab Results: BMET    Component Value Date/Time   NA 137 08/30/2012 1658   NA 143 08/27/2012 1108   K 4.2 08/30/2012 1658   K 4.3 08/27/2012 1108   CL 97 08/30/2012 1658   CL 105 07/24/2012 0859   CO2 30 08/30/2012 1658   CO2 22 08/27/2012 1108   GLUCOSE 104* 08/30/2012 1658   GLUCOSE 138 08/27/2012 1108   GLUCOSE 126* 07/24/2012 0859   BUN 24* 08/30/2012 1658   BUN 15.7 08/27/2012 1108   CREATININE 1.44* 08/30/2012 1658   CREATININE 1.3 08/27/2012 1108   CALCIUM 8.9 08/30/2012 1658   CALCIUM 9.2 08/27/2012 1108   GFRNONAA 49* 08/30/2012 1658   GFRAA 57* 08/30/2012 1658   CBC    Component Value Date/Time   WBC 5.3 08/30/2012 0420   WBC 6.5 08/27/2012 1108   RBC 3.80* 08/30/2012 0420   RBC 4.06* 08/27/2012 1108   HGB 11.6* 08/30/2012 0420   HGB 12.5* 08/27/2012 1108   HCT 37.1* 08/30/2012 0420   HCT 39.3 08/27/2012 1108   PLT 192 08/30/2012 0420   PLT 109 confirmed both analyzers* 08/27/2012 1108   MCV 97.6 08/30/2012 0420   MCV 96.8 08/27/2012 1108   MCH 30.5 08/30/2012 0420   MCH 30.8 08/27/2012 1108   MCHC 31.3 08/30/2012 0420   MCHC 31.8* 08/27/2012 1108   RDW 16.0* 08/30/2012 0420   RDW 16.0* 08/27/2012 1108   LYMPHSABS 1.6 08/27/2012 1845   LYMPHSABS 1.1 08/27/2012 1108   MONOABS 0.7 08/27/2012 1845   MONOABS 0.6 08/27/2012 1108   EOSABS 0.1 08/27/2012 1845   EOSABS 0.0 08/27/2012 1108   BASOSABS 0.0 08/27/2012 1845   BASOSABS 0.0 08/27/2012 1108   CARDIAC ENZYMES Lab Results  Component Value Date   CKTOTAL 95 06/28/2010   CKMB 1.4 06/28/2010   TROPONINI <0.30 08/28/2012    Scheduled Meds: . aspirin EC  81 mg Oral Daily  . atorvastatin  40 mg Oral q1800  . carvedilol  3.125 mg Oral BID WC  . digoxin  0.125 mg Oral Daily  . furosemide  40 mg Intravenous BID  . heparin  5,000 Units Subcutaneous Q8H  . metolazone  5 mg Oral Daily  . potassium chloride  20 mEq Oral Daily  . ramipril  2.5 mg Oral Daily  . sodium chloride  3 mL Intravenous Q12H   Continuous Infusions:  PRN Meds:.acetaminophen, lidocaine-prilocaine, ondansetron (ZOFRAN) IV, sodium chloride  Assessment/Plan: Acute on chronic decompensated systolic heart failure  Hypertensive heart disease with systolic dysfunction  Status post hypotension secondary to medications  Recurrent non-small cell CA of left lung  COPD with pulmonary hypertension  Probable obstructive sleep apnea/obesity hypoventilation syndrome  Morbid obesity  Remote tobacco abuse  Glucose intolerance  Chronic kidney disease stage II   Increase activity.    LOS: 4 days    Orpah Cobb  MD  08/31/2012, 9:10 AM

## 2012-09-01 LAB — RENAL FUNCTION PANEL
Albumin: 3.6 g/dL (ref 3.5–5.2)
BUN: 31 mg/dL — ABNORMAL HIGH (ref 6–23)
Chloride: 94 mEq/L — ABNORMAL LOW (ref 96–112)
Creatinine, Ser: 1.66 mg/dL — ABNORMAL HIGH (ref 0.50–1.35)

## 2012-09-01 MED ORDER — DIGOXIN 125 MCG PO TABS: 0.0625 mg | ORAL_TABLET | Freq: Every day | ORAL | Status: DC

## 2012-09-01 NOTE — Discharge Summary (Signed)
Physician Discharge Summary  Patient ID: [name] MRN: [id number] DOB/AGE: September 14, 1945 67 y.o.  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  COPD with pulmonary hypertension  Probable obstructive sleep apnea/obesity hypoventilation syndrome  Morbid obesity  Remote tobacco abuse  Glucose intolerance  Chronic kidney disease stage II   Discharge Diagnoses:  Principle Problem:  * Acute on chronic decompensated systolic heart failure*  Hypertensive heart disease with systolic dysfunction  Status post hypotension secondary to medications  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 He responded well to IV lasix and was discharged home in stable condition. He has oxygen therapy at home already from COPD, pulmonary hypertension and obstructive sleep apnea treatment.  Consults: None  Significant Diagnostic Studies: labs: Low normal Hgb, normal WBC and normal electrolytes with elevated creatinine of 1.52.  Chest X-ray: IMPRESSION: Persistent pulmonary edema. Persistent consolidation  of left lung base, suspicious for pneumonia.   EKG: SR + anterior ischemia.  Treatments: cardiac meds: ramipril (Altace), carvedilol and furosemide  Discharge Exam: Blood pressure 109/57, pulse 69, temperature 97.9 F (36.6 C), temperature source Axillary, resp. rate 18, height 5\' 9"  (1.753 m), weight 142.883 kg (315 lb), SpO2  97.00%. HEENT: [place]/AT, Eyes-Brown, PERL, EOMI, Conjunctiva-Pale pink, Sclera-Non-icteric  Neck: No JVD, No bruit, Trachea midline.  Lungs: Coarse crackles all over.  Cardiac: Regular rhythm, normal S1 and S2, no S3.  Abdomen: Soft, non-tender.  Extremities: Trace edema present. No cyanosis. No clubbing.  CNS: AxOx3, Cranial nerves grossly intact, moves all 4 extremities. Right handed.  Skin: Warm and dry   Disposition: 01-Home or Self Care   Future Appointments Provider Department Dept Phone   09/03/2012 9:00 AM [facility] MEDICAL ONCOLOGY [phone]   09/03/2012 9:30 AM [facility] [phone]   09/04/2012 8:15 AM [facility] Inj Nurse [facility] MED[facility] ON[facility] [phone]   09/10/2012 9:00 AM [facility] Only [facility] MED[facility]COL[facility] [phone]   09/12/2012 8:00 PM [facility] Room 8 [facility] [place] [phone]   09/17/2012 8:45 AM [facility] Only [facility] MED[facility] ONCOL[facility] [phone]   09/24/2012 9:15 AM [facility] MEDICAL ONCOLOGY [phone]   09/24/2012 9:45 AM [facility] [phone]   09/24/2012 10:45 AM [facility] H[facility] MED[facility] ONCOL[facility] [phone]   09/25/2012 8:00 AM [facility] Inj Nurse [facility] [facility] ONCOL[facility] [phone]   10/01/2012 9:45 AM [facility] Only [place] [facility] MEDICAL ONCOLOGY [phone]   10/08/2012 8:45 AM [facility] Only [place] [facility] MED[facility] ONCOL[facility] [phone]   10/15/2012 9:30 AM [facility] MEDICAL ONCOLOGY [phone]   10/16/2012 8:00 AM [facility] Inj Nurse [facility] MEDICAL ONCOLOG[facility] [phone]       Medication List         aspirin EC 81 MG tablet  Take 81 mg by mouth daily.      atorvastatin 40 MG tablet  Commonly known as:  LIPITOR  Take 40 mg by mouth daily.     carvedilol 3.125 MG tablet  Commonly known as:  COREG  Take 3.125 mg by mouth 2 (two) times daily with a meal.     digoxin 0.125 MG tablet  Commonly known as:  LANOXIN  Take 0.5 tablets (0.0625 mg total) by mouth daily.     furosemide 40 MG tablet  Commonly known as:  LASIX  Take 1 tablet (40 mg total) by mouth 2 (two) times daily.     lidocaine-prilocaine cream  Commonly known as:  EMLA  Apply 1 application topically as needed (for port-a-cath access).     oxyCODONE 5 MG immediate release tablet  Commonly known as:  Oxy IR/ROXICODONE  Take 1 tablet (5 mg total) by mouth every 4 (four) hours as needed for pain.     potassium chloride SA 20 MEQ tablet  Commonly known as:  K-DUR,KLOR-CON  Take 1 tablet (20 mEq total) by mouth 2 (two) times daily.     PRESCRIPTION MEDICATION  Inject into the vein every 21 ( twenty-one) days. Taxol, Avastin and Paraplatin infusion q3weeks.     ramipril 2.5 MG capsule  Commonly known as:  ALTACE  Take 1 capsule (2.5 mg total) by mouth daily.           Follow-up Information   Follow up with Robynn Pane, MD. Schedule an appointment as soon as possible for a visit in 1 week.   Contact information:   [address] [place] [address] [phone]       Signed: Ricki Rodriguez 09/01/2012, 9:14 AM

## 2012-09-01 NOTE — Care Management Note (Signed)
    Page 1 of 1   09/01/2012     1:25:38 PM   CARE MANAGEMENT NOTE 09/01/2012  Patient:  [name], [name]   Account Number:  [id number]  Date Initiated:  08/28/2012  Documentation initiated by:  Ezekiel Ina  Subjective/Objective Assessment:   Pt admitted with SOB, bilateral edema, rales     Action/Plan:   from home   Anticipated DC Date:  09/01/2012   Anticipated DC Plan:  HOME/SELF CARE      DC Planning Services  CM consult      Choice offered to / List presented to:             Status of service:  Completed, signed off Medicare Important Message given?  NA - LOS <3 / Initial given by admissions (If response is "NO", the following Medicare IM given date fields will be blank) Date Medicare IM given:   Date Additional Medicare IM given:    Discharge Disposition:  HOME/SELF CARE  Per UR Regulation:  Reviewed for med. necessity/level of care/duration of stay  If discussed at Long Length of Stay Meetings, dates discussed:    Comments:  09/01/12 [facility] RN,BSN NCM WEEKEND [phone] D/C HOME.NO NEEDS OR ORDERS.  08/30/12 MMcGibboney, RN, BSN Spoke with pt concerning HH needs.  Pt states that he is not home bound and thereof can not have HH. Pt is correct.    08/28/12 MMcGibboney, RN, BSN Chart  reviewed.

## 2012-09-03 ENCOUNTER — Telehealth: Payer: Self-pay | Admitting: *Deleted

## 2012-09-03 ENCOUNTER — Other Ambulatory Visit (HOSPITAL_BASED_OUTPATIENT_CLINIC_OR_DEPARTMENT_OTHER): Payer: Medicare Other | Admitting: Lab

## 2012-09-03 ENCOUNTER — Ambulatory Visit (HOSPITAL_BASED_OUTPATIENT_CLINIC_OR_DEPARTMENT_OTHER): Payer: Medicare Other

## 2012-09-03 DIAGNOSIS — Z5111 Encounter for antineoplastic chemotherapy: Secondary | ICD-10-CM

## 2012-09-03 DIAGNOSIS — C3430 Malignant neoplasm of lower lobe, unspecified bronchus or lung: Secondary | ICD-10-CM

## 2012-09-03 DIAGNOSIS — Z5112 Encounter for antineoplastic immunotherapy: Secondary | ICD-10-CM

## 2012-09-03 DIAGNOSIS — C3492 Malignant neoplasm of unspecified part of left bronchus or lung: Secondary | ICD-10-CM

## 2012-09-03 LAB — CBC WITH DIFFERENTIAL/PLATELET
Basophils Absolute: 0 10*3/uL (ref 0.0–0.1)
Eosinophils Absolute: 0 10*3/uL (ref 0.0–0.5)
LYMPH%: 21.7 % (ref 14.0–49.0)
MCV: 95.3 fL (ref 79.3–98.0)
MONO%: 22 % — ABNORMAL HIGH (ref 0.0–14.0)
NEUT#: 2.2 10*3/uL (ref 1.5–6.5)
Platelets: 252 10*3/uL (ref 140–400)
RBC: 3.81 10*6/uL — ABNORMAL LOW (ref 4.20–5.82)

## 2012-09-03 LAB — COMPREHENSIVE METABOLIC PANEL (CC13)
Alkaline Phosphatase: 83 U/L (ref 40–150)
BUN: 34 mg/dL — ABNORMAL HIGH (ref 7.0–26.0)
Glucose: 116 mg/dl (ref 70–140)
Sodium: 139 mEq/L (ref 136–145)
Total Bilirubin: 0.7 mg/dL (ref 0.20–1.20)

## 2012-09-03 MED ORDER — DEXAMETHASONE SODIUM PHOSPHATE 20 MG/5ML IJ SOLN
20.0000 mg | Freq: Once | INTRAMUSCULAR | Status: AC
  Administered 2012-09-03: 20 mg via INTRAVENOUS

## 2012-09-03 MED ORDER — DIPHENHYDRAMINE HCL 50 MG/ML IJ SOLN
50.0000 mg | Freq: Once | INTRAMUSCULAR | Status: AC
  Administered 2012-09-03: 50 mg via INTRAVENOUS

## 2012-09-03 MED ORDER — SODIUM CHLORIDE 0.9 % IV SOLN
Freq: Once | INTRAVENOUS | Status: AC
  Administered 2012-09-03: 10:00:00 via INTRAVENOUS

## 2012-09-03 MED ORDER — SODIUM CHLORIDE 0.9 % IV SOLN
15.0000 mg/kg | Freq: Once | INTRAVENOUS | Status: AC
  Administered 2012-09-03: 2225 mg via INTRAVENOUS
  Filled 2012-09-03: qty 89

## 2012-09-03 MED ORDER — ONDANSETRON 16 MG/50ML IVPB (CHCC)
16.0000 mg | Freq: Once | INTRAVENOUS | Status: AC
  Administered 2012-09-03: 16 mg via INTRAVENOUS

## 2012-09-03 MED ORDER — HEPARIN SOD (PORK) LOCK FLUSH 100 UNIT/ML IV SOLN
500.0000 [IU] | Freq: Once | INTRAVENOUS | Status: AC | PRN
  Administered 2012-09-03: 500 [IU]
  Filled 2012-09-03: qty 5

## 2012-09-03 MED ORDER — FAMOTIDINE IN NACL 20-0.9 MG/50ML-% IV SOLN
20.0000 mg | Freq: Once | INTRAVENOUS | Status: AC
  Administered 2012-09-03: 20 mg via INTRAVENOUS

## 2012-09-03 MED ORDER — SODIUM CHLORIDE 0.9 % IV SOLN
570.0000 mg | Freq: Once | INTRAVENOUS | Status: AC
  Administered 2012-09-03: 570 mg via INTRAVENOUS
  Filled 2012-09-03: qty 57

## 2012-09-03 MED ORDER — PACLITAXEL CHEMO INJECTION 300 MG/50ML
175.0000 mg/m2 | Freq: Once | INTRAVENOUS | Status: AC
  Administered 2012-09-03: 474 mg via INTRAVENOUS
  Filled 2012-09-03: qty 79

## 2012-09-03 MED ORDER — CEPHALEXIN 500 MG PO CAPS: 500.0000 mg | ORAL_CAPSULE | Freq: Four times a day (QID) | ORAL | Status: DC

## 2012-09-03 MED ORDER — SODIUM CHLORIDE 0.9 % IJ SOLN
10.0000 mL | INTRAMUSCULAR | Status: AC | PRN
  Administered 2012-09-03: 10 mL
  Filled 2012-09-03: qty 10

## 2012-09-03 NOTE — Patient Instructions (Addendum)
Follow up with Dr Laneta Simmers about your port.   [facility] Discharge Instructions for Patients Receiving Chemotherapy  Today you received the following chemotherapy agents TAXOL,CARBOPLATIN, AVASTIN To help prevent nausea and vomiting after your treatment, we encourage you to take your nausea medication  If you develop nausea and vomiting that is not controlled by your nausea medication, call the clinic.   BELOW ARE SYMPTOMS THAT SHOULD BE REPORTED IMMEDIATELY:  *FEVER GREATER THAN 100.5 F  *CHILLS WITH OR WITHOUT FEVER  NAUSEA AND VOMITING THAT IS NOT CONTROLLED WITH YOUR NAUSEA MEDICATION  *UNUSUAL SHORTNESS OF BREATH  *UNUSUAL BRUISING OR BLEEDING  TENDERNESS IN MOUTH AND THROAT WITH OR WITHOUT PRESENCE OF ULCERS  *URINARY PROBLEMS  *BOWEL PROBLEMS  UNUSUAL RASH Items with * indicate a potential emergency and should be followed up as soon as possible.  Feel free to call the clinic you have any questions or concerns. The clinic phone number is [phone].

## 2012-09-03 NOTE — Telephone Encounter (Signed)
Spoke with pt regarding appt with Dr. Laneta Simmers 09/04/12 at 10:15.  He verbalized understanding of time and place of appt

## 2012-09-03 NOTE — Addendum Note (Signed)
Addended by: Reynaldo Minium C on: 09/03/2012 11:53 AM   Modules accepted: Level of Service

## 2012-09-03 NOTE — Progress Notes (Unsigned)
Dr Arbutus Ped in to assess small hole at port incision site -lateral edge. When skin pressed above hole it drains  serous fluid. There is no swelling or evidence of pus. It non tender and accessed without problems . Good blood return without any swelling or pain. 1240-Pt ambulated to BR and back to chair and reported that he  felt very SOB> oxygen sat 56% >( he wears oxygen 3 liters at home) . Oxygen placed on pt at 3 liters and oxygen sat increased to 94 % .

## 2012-09-04 ENCOUNTER — Ambulatory Visit (INDEPENDENT_AMBULATORY_CARE_PROVIDER_SITE_OTHER): Payer: Medicare Other | Admitting: Surgery

## 2012-09-04 ENCOUNTER — Ambulatory Visit (HOSPITAL_BASED_OUTPATIENT_CLINIC_OR_DEPARTMENT_OTHER): Payer: Medicare Other

## 2012-09-04 ENCOUNTER — Telehealth: Payer: Self-pay | Admitting: Medical Oncology

## 2012-09-04 ENCOUNTER — Encounter: Payer: Self-pay | Admitting: Surgery

## 2012-09-04 VITALS — BP 127/50 | HR 88 | Resp 24 | Ht 69.0 in | Wt 317.0 lb

## 2012-09-04 DIAGNOSIS — C3430 Malignant neoplasm of lower lobe, unspecified bronchus or lung: Secondary | ICD-10-CM

## 2012-09-04 DIAGNOSIS — Z452 Encounter for adjustment and management of vascular access device: Secondary | ICD-10-CM

## 2012-09-04 DIAGNOSIS — C799 Secondary malignant neoplasm of unspecified site: Secondary | ICD-10-CM

## 2012-09-04 DIAGNOSIS — C3490 Malignant neoplasm of unspecified part of unspecified bronchus or lung: Secondary | ICD-10-CM

## 2012-09-04 DIAGNOSIS — Z09 Encounter for follow-up examination after completed treatment for conditions other than malignant neoplasm: Secondary | ICD-10-CM

## 2012-09-04 DIAGNOSIS — C801 Malignant (primary) neoplasm, unspecified: Secondary | ICD-10-CM

## 2012-09-04 MED ORDER — PEGFILGRASTIM INJECTION 6 MG/0.6ML
6.0000 mg | Freq: Once | SUBCUTANEOUS | Status: AC
  Administered 2012-09-04: 6 mg via SUBCUTANEOUS
  Filled 2012-09-04: qty 0.6

## 2012-09-04 NOTE — Telephone Encounter (Addendum)
Pt having trouble sleeping 'Most every night" and asking for something to help him sleep. OTC med did not work. Note to Dr Arbutus Ped

## 2012-09-04 NOTE — Progress Notes (Signed)
      [address]       [name] [address]             [phone]        HPI:  The patient is a 67 year old gentleman who underwent insertion of a right subclavian portacath on 08/16/2012 for recurrent adenocarcinoma of the lung. He was seen by oncology and noted to have some serous drainage from the lateral end of the incision. He had no erythema or fever. He was given chemotherapy and I was asked to evaluate the incision.  Current Outpatient Prescriptions  Medication Sig Dispense Refill  . aspirin EC 81 MG tablet Take 81 mg by mouth daily.      Marland Kitchen atorvastatin (LIPITOR) 40 MG tablet Take 40 mg by mouth daily.      . carvedilol (COREG) 3.125 MG tablet Take 3.125 mg by mouth 2 (two) times daily with a meal.       . cephALEXin (KEFLEX) 500 MG capsule Take 1 capsule (500 mg total) by mouth 4 (four) times daily. 500mg  QID x 7 days  28 capsule  0  . digoxin (LANOXIN) 0.125 MG tablet Take 0.5 tablets (0.0625 mg total) by mouth daily.  30 tablet  3  . furosemide (LASIX) 40 MG tablet Take 1 tablet (40 mg total) by mouth 2 (two) times daily.  60 tablet  3  . lidocaine-prilocaine (EMLA) cream Apply 1 application topically as needed (for port-a-cath access).       Marland Kitchen oxyCODONE (OXY IR/ROXICODONE) 5 MG immediate release tablet Take 1 tablet (5 mg total) by mouth every 4 (four) hours as needed for pain.  30 tablet  0  . potassium chloride SA (K-DUR,KLOR-CON) 20 MEQ tablet Take 1 tablet (20 mEq total) by mouth 2 (two) times daily.  60 tablet  3  . PRESCRIPTION MEDICATION Inject into the vein every 21 ( twenty-one) days. Taxol, Avastin and Paraplatin infusion q3weeks.      . ramipril (ALTACE) 2.5 MG capsule Take 1 capsule (2.5 mg total) by mouth daily.  30 capsule  3   No current facility-administered medications for this visit.   Facility-Administered Medications Ordered in Other Visits  Medication Dose Route Frequency Provider Last Rate Last Dose  . sodium chloride 0.9 % injection  10 mL  10 mL Intracatheter PRN Si Gaul, MD   10 mL at 09/03/12 1534     Physical Exam: BP 127/50  Pulse 88  Resp 24  Ht 5\' 9"  (1.753 m)  Wt 317 lb (143.79 kg)  BMI 46.79 kg/m2  SpO2 78% He looks well. The portacath incision is intact with slight separation of the wound edges at the lateral end. There is no erythema or drainage.  Diagnostic Tests: none  Impression:  I think most likely that the subcuticular suture knot dissolved and the drainage that was seen was the dissolved suture material that sometimes looks serous and sometimes looks like pus. I would continue to observe this for now and I will see him back in two weeks to check it. If he develops erythema or drainage persists then we would have to assume the port is infected and remove it.  Plan:  I will see him back in two weeks. He will let me know if there is any fever, erythema, tenderness or persistent drainage.

## 2012-09-05 ENCOUNTER — Telehealth: Payer: Self-pay | Admitting: Medical Oncology

## 2012-09-05 DIAGNOSIS — G4700 Insomnia, unspecified: Secondary | ICD-10-CM

## 2012-09-05 MED ORDER — TEMAZEPAM 30 MG PO CAPS: 30.0000 mg | ORAL_CAPSULE | Freq: Every evening | ORAL | Status: DC | PRN

## 2012-09-05 NOTE — Telephone Encounter (Signed)
Message copied by Charma Igo on Thu Sep 05, 2012 12:49 PM ------      Message from: Si Gaul      Created: Wed Sep 04, 2012 10:13 PM       Restoril 30 mg qhs.      ----- Message -----         From: Charma Igo, RN         Sent: 09/04/2012  11:40 AM           To: Si Gaul, MD            Pt having trouble sleeping 'Most every night" and asking for something to help him sleep. OTC med did not work.             ------

## 2012-09-05 NOTE — Telephone Encounter (Signed)
I left message with Lupita Leash to tell pt to pick up sleeping pill rx.

## 2012-09-10 ENCOUNTER — Other Ambulatory Visit (HOSPITAL_BASED_OUTPATIENT_CLINIC_OR_DEPARTMENT_OTHER): Payer: Medicare Other

## 2012-09-10 DIAGNOSIS — C3492 Malignant neoplasm of unspecified part of left bronchus or lung: Secondary | ICD-10-CM

## 2012-09-10 DIAGNOSIS — C3490 Malignant neoplasm of unspecified part of unspecified bronchus or lung: Secondary | ICD-10-CM

## 2012-09-10 LAB — COMPREHENSIVE METABOLIC PANEL (CC13)
AST: 27 U/L (ref 5–34)
Albumin: 3.5 g/dL (ref 3.5–5.0)
Alkaline Phosphatase: 79 U/L (ref 40–150)
BUN: 47.2 mg/dL — ABNORMAL HIGH (ref 7.0–26.0)
Creatinine: 1.4 mg/dL — ABNORMAL HIGH (ref 0.7–1.3)
Glucose: 141 mg/dl — ABNORMAL HIGH (ref 70–140)
Potassium: 4.5 mEq/L (ref 3.5–5.1)

## 2012-09-10 LAB — CBC WITH DIFFERENTIAL/PLATELET
Basophils Absolute: 0 10*3/uL (ref 0.0–0.1)
EOS%: 4.7 % (ref 0.0–7.0)
Eosinophils Absolute: 0.1 10*3/uL (ref 0.0–0.5)
HCT: 33 % — ABNORMAL LOW (ref 38.4–49.9)
HGB: 11.1 g/dL — ABNORMAL LOW (ref 13.0–17.1)
LYMPH%: 20.9 % (ref 14.0–49.0)
MCH: 31.5 pg (ref 27.2–33.4)
MCV: 94.1 fL (ref 79.3–98.0)
MONO%: 3.1 % (ref 0.0–14.0)
NEUT#: 1.8 10*3/uL (ref 1.5–6.5)
NEUT%: 70.5 % (ref 39.0–75.0)
Platelets: 114 10*3/uL — ABNORMAL LOW (ref 140–400)

## 2012-09-12 ENCOUNTER — Ambulatory Visit (HOSPITAL_BASED_OUTPATIENT_CLINIC_OR_DEPARTMENT_OTHER): Payer: Medicare Other | Attending: Pulmonary Disease | Admitting: Radiology

## 2012-09-12 VITALS — Ht 71.0 in | Wt 317.0 lb

## 2012-09-12 DIAGNOSIS — G4730 Sleep apnea, unspecified: Secondary | ICD-10-CM | POA: Insufficient documentation

## 2012-09-12 DIAGNOSIS — G4733 Obstructive sleep apnea (adult) (pediatric): Secondary | ICD-10-CM

## 2012-09-12 DIAGNOSIS — R259 Unspecified abnormal involuntary movements: Secondary | ICD-10-CM | POA: Insufficient documentation

## 2012-09-12 DIAGNOSIS — G4710 Hypersomnia, unspecified: Secondary | ICD-10-CM | POA: Insufficient documentation

## 2012-09-15 DIAGNOSIS — G4761 Periodic limb movement disorder: Secondary | ICD-10-CM

## 2012-09-15 DIAGNOSIS — R0609 Other forms of dyspnea: Secondary | ICD-10-CM

## 2012-09-15 DIAGNOSIS — G4730 Sleep apnea, unspecified: Secondary | ICD-10-CM

## 2012-09-15 DIAGNOSIS — R0989 Other specified symptoms and signs involving the circulatory and respiratory systems: Secondary | ICD-10-CM

## 2012-09-15 DIAGNOSIS — G4710 Hypersomnia, unspecified: Secondary | ICD-10-CM

## 2012-09-15 NOTE — Procedures (Signed)
NAME:  [name], [name] NO.:  [id number]  MEDICAL RECORD NO.:  [id number]          PATIENT TYPE:  OUT  LOCATION:  SLEEP CENTER                 FACILITY:  [facility]  PHYSICIAN:  Barbaraann Share, MD,FCCPDATE OF BIRTH:  06[birth year]1945  DATE OF STUDY:  09/12/2012                           NOCTURNAL POLYSOMNOGRAM  REFERRING PHYSICIAN:  Barbaraann Share, MD,FCCP  INDICATION FOR STUDY:  Hypersomnia with sleep apnea.  EPWORTH SLEEPINESS SCORE:  3.  MEDICATIONS:  SLEEP ARCHITECTURE:  The patient had a total sleep time of 195 minutes with no slow-wave sleep and only 20 minutes of REM.  Sleep onset latency was prolonged at 151 minutes, and REM onset was normal at 110 minutes. Sleep efficiency was very poor at 49%.  RESPIRATORY DATA:  The patient was found to have 2 central apneas and no obstructive hypopneas, giving him an apnea-hypopnea index of only 0.6 events per hour.  It should be noted that the patient slept in the upright position during the night in a chair, and did have moderate snoring noted throughout.  OXYGEN DATA:  There was oxygen desaturation as low as 96%; however, the patient did sleep with 2 L of nasal cannula as he wears at home.  CARDIAC DATA:  Rare PVC noted, but no clinically significant arrhythmias were seen.  MOVEMENTS/PARASOMNIA:  The patient had 230 leg jerks, with 20 per hour resulting in arousal or awakening.  IMPRESSION/RECOMMENDATION: 1. Small numbers of apneas without significant sleep disruption.     However, the patient slept upright in a reclining chair during the     whole night, which can certainly alter the results of his study.     He was noted to have moderate snoring noted throughout. 2. O2 desaturation as low as 96% during the night;     however, the patient did sleep with 2 L/minute of nasal oxygen as     he normally wears at home. 3. Rare PVC noted, but no clinically significant arrhythmias were     seen. 4. Very large numbers  of limb movements, with significant sleep     disruption.  It is unclear whether this represents     a primary movement disorder of sleep, or related to another     musculoskeletal issue.  Clinical correlation is suggested.     Barbaraann Share, MD,FCCP Diplomate, American Board of Sleep Medicine    [facility]MED[facility]  D:  09/15/2012 15:37:35  T:  09/15/2012 19:11:32  Job:  [id number]

## 2012-09-17 ENCOUNTER — Other Ambulatory Visit (HOSPITAL_BASED_OUTPATIENT_CLINIC_OR_DEPARTMENT_OTHER): Payer: Medicare Other

## 2012-09-17 ENCOUNTER — Telehealth: Payer: Self-pay | Admitting: Pulmonary Disease

## 2012-09-17 DIAGNOSIS — C3490 Malignant neoplasm of unspecified part of unspecified bronchus or lung: Secondary | ICD-10-CM

## 2012-09-17 DIAGNOSIS — C3492 Malignant neoplasm of unspecified part of left bronchus or lung: Secondary | ICD-10-CM

## 2012-09-17 LAB — CBC WITH DIFFERENTIAL/PLATELET
Basophils Absolute: 0 10*3/uL (ref 0.0–0.1)
EOS%: 0.6 % (ref 0.0–7.0)
Eosinophils Absolute: 0 10*3/uL (ref 0.0–0.5)
HCT: 33 % — ABNORMAL LOW (ref 38.4–49.9)
HGB: 11 g/dL — ABNORMAL LOW (ref 13.0–17.1)
MCH: 31.4 pg (ref 27.2–33.4)
MCV: 93.9 fL (ref 79.3–98.0)
MONO%: 12.9 % (ref 0.0–14.0)
NEUT#: 3.5 10*3/uL (ref 1.5–6.5)
NEUT%: 66.2 % (ref 39.0–75.0)
RDW: 17.3 % — ABNORMAL HIGH (ref 11.0–14.6)
lymph#: 1 10*3/uL (ref 0.9–3.3)

## 2012-09-17 LAB — COMPREHENSIVE METABOLIC PANEL (CC13)
Albumin: 3.4 g/dL — ABNORMAL LOW (ref 3.5–5.0)
BUN: 16.5 mg/dL (ref 7.0–26.0)
Calcium: 9.2 mg/dL (ref 8.4–10.4)
Chloride: 106 mEq/L (ref 98–109)
Creatinine: 1.2 mg/dL (ref 0.7–1.3)
Glucose: 106 mg/dl (ref 70–140)
Potassium: 3.6 mEq/L (ref 3.5–5.1)

## 2012-09-17 NOTE — Telephone Encounter (Signed)
Please let pt know that his sleep study did not show sleep apnea as long as he slept in a recliner.  I suspect he would have a problem if he tried to sleep in bed not inclined.  He did have a lot of leg jerks during the night that seemed to disrupt his sleep.  See if he has issues with leg movements in the evening and at night while he is sleeping.  There may be a medication we can give him to help with this and his sleep.

## 2012-09-17 NOTE — Telephone Encounter (Signed)
Pt aware of results. States that he does not have leg movements/discomfort that he is aware of.

## 2012-09-17 NOTE — Telephone Encounter (Signed)
Noted  

## 2012-09-18 ENCOUNTER — Ambulatory Visit (INDEPENDENT_AMBULATORY_CARE_PROVIDER_SITE_OTHER): Payer: Medicare Other | Admitting: Surgery

## 2012-09-18 ENCOUNTER — Encounter (HOSPITAL_COMMUNITY): Payer: Self-pay | Admitting: Pharmacy Technician

## 2012-09-18 ENCOUNTER — Other Ambulatory Visit: Payer: Self-pay | Admitting: *Deleted

## 2012-09-18 ENCOUNTER — Encounter: Payer: Self-pay | Admitting: Surgery

## 2012-09-18 DIAGNOSIS — Z09 Encounter for follow-up examination after completed treatment for conditions other than malignant neoplasm: Secondary | ICD-10-CM

## 2012-09-18 DIAGNOSIS — C801 Malignant (primary) neoplasm, unspecified: Secondary | ICD-10-CM

## 2012-09-18 DIAGNOSIS — C3490 Malignant neoplasm of unspecified part of unspecified bronchus or lung: Secondary | ICD-10-CM

## 2012-09-18 DIAGNOSIS — C799 Secondary malignant neoplasm of unspecified site: Secondary | ICD-10-CM

## 2012-09-18 NOTE — Progress Notes (Signed)
[address]       [name] [address]             [phone]        HPI:  The patient is a 67 year old gentleman who underwent insertion of a right subclavian portacath on 08/16/2012 for recurrent adenocarcinoma of the lung. He was seen by oncology and noted to have some serous drainage from the lateral end of the incision. He had no erythema or fever. He was given chemotherapy and I was asked to evaluate the incision. When I saw him on 09/04/2012 there was slight separation of the skin edges at the lateral end of the incision that looked like the subcuticular suture knot had protruded. There was no sign of infection so we decided to follow this. He has had further chemotherapy through this. He denies any fever or chills.   Current Outpatient Prescriptions  Medication Sig Dispense Refill  . aspirin EC 81 MG tablet Take 81 mg by mouth daily.      Marland Kitchen atorvastatin (LIPITOR) 40 MG tablet Take 40 mg by mouth daily.      . carvedilol (COREG) 3.125 MG tablet Take 3.125 mg by mouth 2 (two) times daily with a meal.       . digoxin (LANOXIN) 0.125 MG tablet Take 0.5 tablets (0.0625 mg total) by mouth daily.  30 tablet  3  . furosemide (LASIX) 40 MG tablet Take 1 tablet (40 mg total) by mouth 2 (two) times daily.  60 tablet  3  . lidocaine-prilocaine (EMLA) cream Apply 1 application topically as needed (for port-a-cath access).       Marland Kitchen oxyCODONE (OXY IR/ROXICODONE) 5 MG immediate release tablet Take 1 tablet (5 mg total) by mouth every 4 (four) hours as needed for pain.  30 tablet  0  . potassium chloride SA (K-DUR,KLOR-CON) 20 MEQ tablet Take 1 tablet (20 mEq total) by mouth 2 (two) times daily.  60 tablet  3  . PRESCRIPTION MEDICATION Inject into the vein every 21 ( twenty-one) days. Taxol, Avastin and Paraplatin infusion q3weeks.      . ramipril (ALTACE) 2.5 MG capsule Take 1 capsule (2.5 mg total) by mouth daily.  30 capsule  3  . temazepam (RESTORIL) 30 MG capsule Take 1  capsule (30 mg total) by mouth at bedtime as needed for sleep.  30 capsule  0   No current facility-administered medications for this visit.   Facility-Administered Medications Ordered in Other Visits  Medication Dose Route Frequency Provider Last Rate Last Dose  . sodium chloride 0.9 % injection 10 mL  10 mL Intracatheter PRN Si Gaul, MD   10 mL at 09/03/12 1534     Physical Exam: BP 116/87  Pulse 88  Resp 20  Ht 5\' 9"  (1.753 m)  Wt 317 lb (143.79 kg)  BMI 46.79 kg/m2  SpO2 88% He looks well The lateral end of the portacath incision has opened up a little more and I can see down to the pocket. There is no drainage or erythema. There is also slight separation of the medial end of the incision.  Diagnostic Tests:  none  Impression:  There is communication between the portacath pocket and the skin and this will lead to infection of the portacath. I am not sure why the subcutaneous tissues have separated but it may be that there was some infiltration of chemotherapy into the pocket if the needle became dislodged. The portacath is  deep due to the patient's obesity and it would be rather easy for needle dislodgement to occur. The portacath needs to be removed and replaced on the other side if he needs to continue chemotherapy. He is scheduled for another infusion on Tuesday next week and I will leave it in until then and plan to remove it next Friday. I will check with oncology to see how much further chemo is needed and replace the portacath if he is going to need chemo for a while.  Plan:  I will remove the portacath next Friday and replace if indicated. The patient is in agreement.

## 2012-09-24 ENCOUNTER — Ambulatory Visit (HOSPITAL_BASED_OUTPATIENT_CLINIC_OR_DEPARTMENT_OTHER): Payer: Medicare Other

## 2012-09-24 ENCOUNTER — Ambulatory Visit (HOSPITAL_BASED_OUTPATIENT_CLINIC_OR_DEPARTMENT_OTHER): Payer: Medicare Other | Admitting: Physician Assistant

## 2012-09-24 ENCOUNTER — Other Ambulatory Visit (HOSPITAL_BASED_OUTPATIENT_CLINIC_OR_DEPARTMENT_OTHER): Payer: Medicare Other | Admitting: Lab

## 2012-09-24 DIAGNOSIS — J449 Chronic obstructive pulmonary disease, unspecified: Secondary | ICD-10-CM

## 2012-09-24 DIAGNOSIS — C3430 Malignant neoplasm of lower lobe, unspecified bronchus or lung: Secondary | ICD-10-CM

## 2012-09-24 DIAGNOSIS — R0602 Shortness of breath: Secondary | ICD-10-CM

## 2012-09-24 DIAGNOSIS — Z5112 Encounter for antineoplastic immunotherapy: Secondary | ICD-10-CM

## 2012-09-24 DIAGNOSIS — Z5111 Encounter for antineoplastic chemotherapy: Secondary | ICD-10-CM

## 2012-09-24 DIAGNOSIS — C3492 Malignant neoplasm of unspecified part of left bronchus or lung: Secondary | ICD-10-CM

## 2012-09-24 LAB — CBC WITH DIFFERENTIAL/PLATELET
Eosinophils Absolute: 0 10*3/uL (ref 0.0–0.5)
MCV: 96.1 fL (ref 79.3–98.0)
MONO%: 9.8 % (ref 0.0–14.0)
NEUT#: 2.4 10*3/uL (ref 1.5–6.5)
RBC: 3.56 10*6/uL — ABNORMAL LOW (ref 4.20–5.82)
RDW: 17 % — ABNORMAL HIGH (ref 11.0–14.6)
WBC: 4.2 10*3/uL (ref 4.0–10.3)
lymph#: 1.3 10*3/uL (ref 0.9–3.3)
nRBC: 0 % (ref 0–0)

## 2012-09-24 LAB — COMPREHENSIVE METABOLIC PANEL (CC13)
ALT: 15 U/L (ref 0–55)
AST: 22 U/L (ref 5–34)
Calcium: 9.3 mg/dL (ref 8.4–10.4)
Chloride: 107 mEq/L (ref 98–109)
Creatinine: 1.1 mg/dL (ref 0.7–1.3)
Total Bilirubin: 0.55 mg/dL (ref 0.20–1.20)

## 2012-09-24 LAB — UA PROTEIN, DIPSTICK - CHCC: Protein, ur: NEGATIVE mg/dL

## 2012-09-24 MED ORDER — DIPHENHYDRAMINE HCL 50 MG/ML IJ SOLN
50.0000 mg | Freq: Once | INTRAMUSCULAR | Status: AC
  Administered 2012-09-24: 50 mg via INTRAVENOUS

## 2012-09-24 MED ORDER — PACLITAXEL CHEMO INJECTION 300 MG/50ML
175.0000 mg/m2 | Freq: Once | INTRAVENOUS | Status: AC
  Administered 2012-09-24: 474 mg via INTRAVENOUS
  Filled 2012-09-24: qty 79

## 2012-09-24 MED ORDER — FAMOTIDINE IN NACL 20-0.9 MG/50ML-% IV SOLN
20.0000 mg | Freq: Once | INTRAVENOUS | Status: AC
Start: 2012-09-24 — End: 2012-09-24
  Administered 2012-09-24: 20 mg via INTRAVENOUS

## 2012-09-24 MED ORDER — SODIUM CHLORIDE 0.9 % IV SOLN
Freq: Once | INTRAVENOUS | Status: AC
  Administered 2012-09-24: 11:00:00 via INTRAVENOUS

## 2012-09-24 MED ORDER — ONDANSETRON 16 MG/50ML IVPB (CHCC)
16.0000 mg | Freq: Once | INTRAVENOUS | Status: AC
  Administered 2012-09-24: 16 mg via INTRAVENOUS

## 2012-09-24 MED ORDER — DEXAMETHASONE SODIUM PHOSPHATE 20 MG/5ML IJ SOLN
20.0000 mg | Freq: Once | INTRAMUSCULAR | Status: AC
  Administered 2012-09-24: 20 mg via INTRAVENOUS

## 2012-09-24 MED ORDER — SODIUM CHLORIDE 0.9 % IV SOLN
570.0000 mg | Freq: Once | INTRAVENOUS | Status: AC
  Administered 2012-09-24: 570 mg via INTRAVENOUS
  Filled 2012-09-24: qty 57

## 2012-09-24 MED ORDER — SODIUM CHLORIDE 0.9 % IJ SOLN
10.0000 mL | INTRAMUSCULAR | Status: DC | PRN
  Administered 2012-09-24: 10 mL
  Filled 2012-09-24: qty 10

## 2012-09-24 MED ORDER — HEPARIN SOD (PORK) LOCK FLUSH 100 UNIT/ML IV SOLN
500.0000 [IU] | Freq: Once | INTRAVENOUS | Status: AC | PRN
  Administered 2012-09-24: 500 [IU]
  Filled 2012-09-24: qty 5

## 2012-09-24 MED ORDER — SODIUM CHLORIDE 0.9 % IV SOLN
15.0000 mg/kg | Freq: Once | INTRAVENOUS | Status: AC
  Administered 2012-09-24: 2225 mg via INTRAVENOUS
  Filled 2012-09-24: qty 89

## 2012-09-24 NOTE — Progress Notes (Signed)
Okay to use port-a-cath today, per Dr. Laneta Simmers; port to be replaced on 09/26/12 by Dr. Laneta Simmers; patient verified understanding

## 2012-09-24 NOTE — Patient Instructions (Addendum)
Keep your point with Dr. Laneta Simmers as scheduled on 09/27/2012 to have your Port-A-Cath removed and a new 1 placed Continue with weekly labs as scheduled Followup with Dr. Arbutus Ped in 3 weeks with a restaging CT scan of the chest with contrast to reevaluate your disease
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
## 2012-09-24 NOTE — Pre-Procedure Instructions (Signed)
[name]  09/24/2012   Your procedure is scheduled on:  Fri, Aug 29 @ 7:30 AM  Report to [facility] at 5:30 AM.  Call this number if you have problems the morning of surgery: [phone]   Remember:   Do not eat food or drink liquids after midnight.   Take these medicines the morning of surgery with A SIP OF WATER: Carvedilol(Coreg),Digoxin(Lanoxin),and Pain Pill(if needed)              No Goodys,BC's,Aleve,Ibuprofen,Fish Oil,or any Herbal Medications   Do not wear jewelry  Do not wear lotions, powders, or colognes. You may wear deodorant.  Men may shave face and neck.  Do not bring valuables to the hospital.  [facility] is not responsible                   for any belongings or valuables.  Contacts, dentures or bridgework may not be worn into surgery.  Leave suitcase in the car. After surgery it may be brought to your room.  For patients admitted to the hospital, checkout time is 11:00 AM the day of  discharge.   Patients discharged the day of surgery will not be allowed to drive  home.    Special Instructions: Shower using CHG 2 nights before surgery and the night before surgery.  If you shower the day of surgery use CHG.  Use special wash - you have one bottle of CHG for all showers.  You should use approximately 1/3 of the bottle for each shower.   Please read over the following fact sheets that you were given: Pain Booklet, Coughing and Deep Breathing and Surgical Site Infection Prevention

## 2012-09-25 ENCOUNTER — Ambulatory Visit (HOSPITAL_BASED_OUTPATIENT_CLINIC_OR_DEPARTMENT_OTHER): Payer: Medicare Other

## 2012-09-25 ENCOUNTER — Encounter (HOSPITAL_COMMUNITY)
Admission: RE | Admit: 2012-09-25 | Discharge: 2012-09-25 | Disposition: A | Discharge status: Home / Self Care | Payer: Medicare Other | Source: Ambulatory Visit | Attending: Surgery | Admitting: Surgery

## 2012-09-25 ENCOUNTER — Encounter (HOSPITAL_COMMUNITY): Payer: Self-pay

## 2012-09-25 VITALS — BP 143/78 | HR 66 | Temp 97.8°F | Resp 20 | Ht 69.0 in | Wt 320.0 lb

## 2012-09-25 DIAGNOSIS — C3430 Malignant neoplasm of lower lobe, unspecified bronchus or lung: Secondary | ICD-10-CM

## 2012-09-25 DIAGNOSIS — Z5189 Encounter for other specified aftercare: Secondary | ICD-10-CM

## 2012-09-25 DIAGNOSIS — C801 Malignant (primary) neoplasm, unspecified: Secondary | ICD-10-CM

## 2012-09-25 HISTORY — DX: Insomnia, unspecified: G47.00

## 2012-09-25 HISTORY — DX: Localized edema: R60.0

## 2012-09-25 HISTORY — DX: Edema, unspecified: R60.9

## 2012-09-25 HISTORY — DX: Hypokalemia: E87.6

## 2012-09-25 HISTORY — DX: Anesthesia of skin: R20.0

## 2012-09-25 LAB — CBC
MCH: 30.9 pg (ref 26.0–34.0)
MCHC: 33.3 g/dL (ref 30.0–36.0)
Platelets: 165 10*3/uL (ref 150–400)
RDW: 16.4 % — ABNORMAL HIGH (ref 11.5–15.5)

## 2012-09-25 LAB — COMPREHENSIVE METABOLIC PANEL
ALT: 16 U/L (ref 0–53)
AST: 26 U/L (ref 0–37)
Albumin: 3.5 g/dL (ref 3.5–5.2)
Alkaline Phosphatase: 84 U/L (ref 39–117)
Calcium: 9.3 mg/dL (ref 8.4–10.5)
GFR calc Af Amer: 77 mL/min — ABNORMAL LOW (ref >90)
Glucose, Bld: 146 mg/dL — ABNORMAL HIGH (ref 70–99)
Potassium: 4.4 mEq/L (ref 3.5–5.1)
Sodium: 138 mEq/L (ref 135–145)
Total Protein: 8.2 g/dL (ref 6.0–8.3)

## 2012-09-25 LAB — APTT: aPTT: 30 seconds (ref 24–37)

## 2012-09-25 MED ORDER — PEGFILGRASTIM INJECTION 6 MG/0.6ML
6.0000 mg | Freq: Once | SUBCUTANEOUS | Status: AC
  Administered 2012-09-25: 6 mg via SUBCUTANEOUS
  Filled 2012-09-25: qty 0.6

## 2012-09-25 NOTE — Progress Notes (Signed)
09/25/12 0915  OBSTRUCTIVE SLEEP APNEA  Have you ever been diagnosed with sleep apnea through a sleep study? No  Do you snore loudly (loud enough to be heard through closed doors)?  0  Do you often feel tired, fatigued, or sleepy during the daytime? 0  Has anyone observed you stop breathing during your sleep? 0  Do you have, or are you being treated for high blood pressure? 1  BMI more than 35 kg/m2? 1  Age over 67 years old? 1  Neck circumference greater than 40 cm/18 inches? 1  Gender: 1  Obstructive Sleep Apnea Score 5  Score 4 or greater  Results sent to PCP

## 2012-09-25 NOTE — Progress Notes (Addendum)
  Dr.Harwani is Cardiologist with last visit a couple of wks ago-to request report  Echo report in epic from 06/13/12  Multiple stress test reports in epic with last one on 06/30/12  Heart cath in epic from 07/02/12  Sleep study in epic from 2014  EKG in epic from 08/27/12  Medical Md is Dr.Harwani

## 2012-09-26 MED ORDER — CEFUROXIME SODIUM 1.5 G IJ SOLR
1.5000 g | INTRAMUSCULAR | Status: AC
  Administered 2012-09-27: 1.5 g via INTRAVENOUS
  Filled 2012-09-26: qty 1.5

## 2012-09-27 ENCOUNTER — Encounter (HOSPITAL_COMMUNITY)
Admission: RE | Disposition: A | Discharge status: Home / Self Care | Payer: Self-pay | Source: Ambulatory Visit | Attending: Surgery

## 2012-09-27 ENCOUNTER — Encounter (HOSPITAL_COMMUNITY): Payer: Self-pay | Admitting: Certified Registered Nurse Anesthetist

## 2012-09-27 ENCOUNTER — Ambulatory Visit (HOSPITAL_COMMUNITY): Payer: Medicare Other | Admitting: Certified Registered Nurse Anesthetist

## 2012-09-27 ENCOUNTER — Ambulatory Visit (HOSPITAL_COMMUNITY): Payer: Medicare Other

## 2012-09-27 ENCOUNTER — Ambulatory Visit (HOSPITAL_COMMUNITY)
Admission: RE | Admit: 2012-09-27 | Discharge: 2012-09-27 | Disposition: A | Discharge status: Home / Self Care | Payer: Medicare Other | Source: Ambulatory Visit | Attending: Surgery | Admitting: Surgery

## 2012-09-27 DIAGNOSIS — C801 Malignant (primary) neoplasm, unspecified: Secondary | ICD-10-CM

## 2012-09-27 DIAGNOSIS — C3410 Malignant neoplasm of upper lobe, unspecified bronchus or lung: Secondary | ICD-10-CM | POA: Insufficient documentation

## 2012-09-27 DIAGNOSIS — M129 Arthropathy, unspecified: Secondary | ICD-10-CM | POA: Insufficient documentation

## 2012-09-27 DIAGNOSIS — Z902 Acquired absence of lung [part of]: Secondary | ICD-10-CM | POA: Insufficient documentation

## 2012-09-27 DIAGNOSIS — C3490 Malignant neoplasm of unspecified part of unspecified bronchus or lung: Secondary | ICD-10-CM

## 2012-09-27 DIAGNOSIS — R011 Cardiac murmur, unspecified: Secondary | ICD-10-CM | POA: Insufficient documentation

## 2012-09-27 DIAGNOSIS — R7309 Other abnormal glucose: Secondary | ICD-10-CM | POA: Insufficient documentation

## 2012-09-27 DIAGNOSIS — I10 Essential (primary) hypertension: Secondary | ICD-10-CM | POA: Insufficient documentation

## 2012-09-27 DIAGNOSIS — I872 Venous insufficiency (chronic) (peripheral): Secondary | ICD-10-CM | POA: Insufficient documentation

## 2012-09-27 DIAGNOSIS — E785 Hyperlipidemia, unspecified: Secondary | ICD-10-CM | POA: Insufficient documentation

## 2012-09-27 DIAGNOSIS — Z8701 Personal history of pneumonia (recurrent): Secondary | ICD-10-CM | POA: Insufficient documentation

## 2012-09-27 DIAGNOSIS — Z7982 Long term (current) use of aspirin: Secondary | ICD-10-CM | POA: Insufficient documentation

## 2012-09-27 DIAGNOSIS — Z79899 Other long term (current) drug therapy: Secondary | ICD-10-CM | POA: Insufficient documentation

## 2012-09-27 HISTORY — PX: PORTACATH PLACEMENT: SHX2246

## 2012-09-27 HISTORY — PX: PORT-A-CATH REMOVAL: SHX5289

## 2012-09-27 SURGERY — REMOVAL PORT-A-CATH
Anesthesia: General | Site: Chest | Laterality: Right | Wound class: Dirty or Infected

## 2012-09-27 MED ORDER — SODIUM CHLORIDE 0.9 % IR SOLN
Status: DC | PRN
  Administered 2012-09-27: 1000 mL

## 2012-09-27 MED ORDER — FENTANYL CITRATE 0.05 MG/ML IJ SOLN
INTRAMUSCULAR | Status: DC | PRN
  Administered 2012-09-27 (×5): 25 ug via INTRAVENOUS

## 2012-09-27 MED ORDER — LACTATED RINGERS IV SOLN
INTRAVENOUS | Status: DC | PRN
  Administered 2012-09-27: 07:00:00 via INTRAVENOUS

## 2012-09-27 MED ORDER — LIDOCAINE HCL (CARDIAC) 20 MG/ML IV SOLN
INTRAVENOUS | Status: DC | PRN
  Administered 2012-09-27: 20 mg via INTRAVENOUS

## 2012-09-27 MED ORDER — EPHEDRINE SULFATE 50 MG/ML IJ SOLN
INTRAMUSCULAR | Status: DC | PRN
  Administered 2012-09-27 (×5): 5 mg via INTRAVENOUS

## 2012-09-27 MED ORDER — CARVEDILOL 3.125 MG PO TABS
3.1250 mg | ORAL_TABLET | Freq: Two times a day (BID) | ORAL | Status: DC
  Administered 2012-09-27 (×3): 3.125 mg via ORAL
  Filled 2012-09-27 (×2): qty 1

## 2012-09-27 MED ORDER — HEPARIN SODIUM (PORCINE) 1000 UNIT/ML IJ SOLN
INTRAMUSCULAR | Status: AC
  Filled 2012-09-27: qty 1

## 2012-09-27 MED ORDER — LIDOCAINE-EPINEPHRINE (PF) 1 %-1:200000 IJ SOLN
INTRAMUSCULAR | Status: AC
  Filled 2012-09-27: qty 10

## 2012-09-27 MED ORDER — ONDANSETRON HCL 4 MG/2ML IJ SOLN
INTRAMUSCULAR | Status: DC | PRN
  Administered 2012-09-27: 4 mg via INTRAVENOUS

## 2012-09-27 MED ORDER — PHENYLEPHRINE HCL 10 MG/ML IJ SOLN
INTRAMUSCULAR | Status: DC | PRN
  Administered 2012-09-27: 80 ug via INTRAVENOUS
  Administered 2012-09-27 (×2): 40 ug via INTRAVENOUS

## 2012-09-27 MED ORDER — ONDANSETRON HCL 4 MG/2ML IJ SOLN: 4.0000 mg | Freq: Four times a day (QID) | INTRAMUSCULAR | Status: DC | PRN

## 2012-09-27 MED ORDER — OXYCODONE HCL 5 MG PO TABS: 5.0000 mg | ORAL_TABLET | Freq: Once | ORAL | Status: DC | PRN

## 2012-09-27 MED ORDER — SODIUM CHLORIDE 0.9 % IR SOLN
Status: DC | PRN
  Administered 2012-09-27: 08:00:00

## 2012-09-27 MED ORDER — HEPARIN SODIUM (PORCINE) 1000 UNIT/ML DIALYSIS
INTRAMUSCULAR | Status: DC | PRN
  Administered 2012-09-27: 3 [IU]

## 2012-09-27 MED ORDER — FENTANYL CITRATE 0.05 MG/ML IJ SOLN: 25.0000 ug | INTRAMUSCULAR | Status: DC | PRN

## 2012-09-27 MED ORDER — DEXTROSE 5 % IV SOLN
10.0000 mg | INTRAVENOUS | Status: DC | PRN
  Administered 2012-09-27: 50 ug/min via INTRAVENOUS

## 2012-09-27 MED ORDER — OXYCODONE HCL 5 MG/5ML PO SOLN: 5.0000 mg | Freq: Once | ORAL | Status: DC | PRN

## 2012-09-27 MED ORDER — PROPOFOL 10 MG/ML IV BOLUS
INTRAVENOUS | Status: DC | PRN
  Administered 2012-09-27: 150 mg via INTRAVENOUS
  Administered 2012-09-27: 30 mg via INTRAVENOUS

## 2012-09-27 SURGICAL SUPPLY — 43 items
ADH SKN CLS APL DERMABOND .7 (GAUZE/BANDAGES/DRESSINGS) ×4
BAG DECANTER FOR FLEXI CONT (MISCELLANEOUS) ×3 IMPLANT
BLADE SURG 11 STRL SS (BLADE) ×3 IMPLANT
BLADE SURG 15 STRL LF DISP TIS (BLADE) ×2 IMPLANT
BLADE SURG 15 STRL SS (BLADE) ×3
CANISTER SUCTION 2500CC (MISCELLANEOUS) ×3 IMPLANT
CLOTH BEACON ORANGE TIMEOUT ST (SAFETY) ×3 IMPLANT
CLSR STERI-STRIP ANTIMIC 1/2X4 (GAUZE/BANDAGES/DRESSINGS) ×2 IMPLANT
COVER SURGICAL LIGHT HANDLE (MISCELLANEOUS) ×6 IMPLANT
DERMABOND ADVANCED (GAUZE/BANDAGES/DRESSINGS) ×2
DERMABOND ADVANCED .7 DNX12 (GAUZE/BANDAGES/DRESSINGS) ×2 IMPLANT
DRAPE C-ARM 42X72 X-RAY (DRAPES) ×3 IMPLANT
DRAPE CHEST BREAST 15X10 FENES (DRAPES) ×3 IMPLANT
DRAPE INCISE IOBAN 66X45 STRL (DRAPES) ×3 IMPLANT
DRAPE LAPAROTOMY T 102X78X121 (DRAPES) ×3 IMPLANT
ELECT CAUTERY BLADE 6.4 (BLADE) ×3 IMPLANT
ELECT REM PT RETURN 9FT ADLT (ELECTROSURGICAL) ×3
ELECTRODE REM PT RTRN 9FT ADLT (ELECTROSURGICAL) ×2 IMPLANT
GLOVE EUDERMIC 7 POWDERFREE (GLOVE) ×5 IMPLANT
GOWN PREVENTION PLUS XLARGE (GOWN DISPOSABLE) ×3 IMPLANT
GOWN STRL NON-REIN LRG LVL3 (GOWN DISPOSABLE) ×3 IMPLANT
KIT BASIN OR (CUSTOM PROCEDURE TRAY) ×3 IMPLANT
KIT PORT POWER 9.6FR MRI PREA (Catheter) ×1 IMPLANT
KIT PORT POWER ISP 8FR (Catheter) IMPLANT
KIT POWER CATH 8FR (Catheter) IMPLANT
KIT ROOM TURNOVER OR (KITS) ×3 IMPLANT
NEEDLE 22X1 1/2 (OR ONLY) (NEEDLE) ×3 IMPLANT
NS IRRIG 1000ML POUR BTL (IV SOLUTION) ×3 IMPLANT
PACK GENERAL/GYN (CUSTOM PROCEDURE TRAY) ×3 IMPLANT
PAD ARMBOARD 7.5X6 YLW CONV (MISCELLANEOUS) ×6 IMPLANT
SPONGE GAUZE 4X4 12PLY (GAUZE/BANDAGES/DRESSINGS) ×3 IMPLANT
SUT PROLENE 5 0 C 1 24 (SUTURE) ×2 IMPLANT
SUT SILK 1 SH (SUTURE) ×2 IMPLANT
SUT SILK 2 0 SH (SUTURE) ×7 IMPLANT
SUT VIC AB 3-0 SH 27 (SUTURE) ×3
SUT VIC AB 3-0 SH 27X BRD (SUTURE) ×2 IMPLANT
SUT VIC AB 4-0 PS2 27 (SUTURE) ×3 IMPLANT
SYR 20CC LL (SYRINGE) ×3 IMPLANT
SYR CONTROL 10ML LL (SYRINGE) ×3 IMPLANT
TAPE CLOTH SURG 4X10 WHT LF (GAUZE/BANDAGES/DRESSINGS) ×1 IMPLANT
TOWEL OR 17X24 6PK STRL BLUE (TOWEL DISPOSABLE) ×3 IMPLANT
TOWEL OR 17X26 10 PK STRL BLUE (TOWEL DISPOSABLE) ×3 IMPLANT
WATER STERILE IRR 1000ML POUR (IV SOLUTION) ×3 IMPLANT

## 2012-09-27 NOTE — Anesthesia Postprocedure Evaluation (Signed)
Anesthesia Post Note  Patient: [name]  Procedure(s) Performed: Procedure(s) (LRB): REMOVAL PORT-A-CATH (Right) INSERTION PORT-A-CATH (Left)  Anesthesia type: General  Patient location: PACU  Post pain: Pain level controlled and Adequate analgesia  Post assessment: Post-op Vital signs reviewed, Patient's Cardiovascular Status Stable, Respiratory Function Stable, Patent Airway and Pain level controlled  Last Vitals:  Filed Vitals:   09/27/12 0938  BP: 85/53  Pulse: 80  Temp:   Resp: 16    Post vital signs: Reviewed and stable  Level of consciousness: awake, alert  and oriented  Complications: No apparent anesthesia complications

## 2012-09-27 NOTE — Transfer of Care (Signed)
Immediate Anesthesia Transfer of Care Note  Patient: [name]  Procedure(s) Performed: Procedure(s): REMOVAL PORT-A-CATH (Right) INSERTION PORT-A-CATH (Left)  Patient Location: PACU  Anesthesia Type:General  Level of Consciousness: awake, alert  and oriented  Airway & Oxygen Therapy: Patient Spontanous Breathing and Patient connected to nasal cannula oxygen  Post-op Assessment: Report given to PACU RN and Post -op Vital signs reviewed and stable  Post vital signs: Reviewed and stable  Complications: No apparent anesthesia complications

## 2012-09-27 NOTE — Anesthesia Procedure Notes (Signed)
Procedure Name: LMA Insertion Date/Time: 09/27/2012 7:39 AM Performed by: Margaree Mackintosh Pre-anesthesia Checklist: Patient identified, Timeout performed, Emergency Drugs available, Suction available and Patient being monitored Patient Re-evaluated:Patient Re-evaluated prior to inductionOxygen Delivery Method: Circle system utilized Preoxygenation: Pre-oxygenation with 100% oxygen Intubation Type: IV induction Ventilation: Two handed mask ventilation required LMA: LMA inserted LMA Size: 5.0 Number of attempts: 1 Placement Confirmation: positive ETCO2 and breath sounds checked- equal and bilateral Tube secured with: Tape Dental Injury: Teeth and Oropharynx as per pre-operative assessment

## 2012-09-27 NOTE — Op Note (Signed)
CARDIOVASCULAR SURGERY OPERATIVE NOTE  09/27/2012 [name] [id number]  Surgeon:  Alleen Borne, MD  First Assistant: none   Preoperative Diagnosis:  Recurrent adenocarcinoma of the lung   Postoperative Diagnosis:  Same   Procedure:  1. Insertion of left subclavian portacath 2. Removal of right subclavian portacath   Anesthesia:  General LMA   Clinical History/Surgical Indication:  The patient is a 67 year old gentleman who has recurrent adenocarcinoma of the lung s/p left lower lobectomy 01/04/2012 for stage IIB disease that was negative for EGFR and ALK. He has developed worsening shortness of breath. He was started on chemotherapy on 08/13/2012. He had a portacath inserted in the right subclavian vein on 08/16/2012. This was used a couple weeks later and he began draining some serous fluid from the lateral aspect of the incision. I have followed this in the office and the incision has continued to separate more and it was felt that the portacath should be removed and replaced on the other side to prevent eventual infection. I discussed the procedure with the patient, alternatives, and risks including infection, bleeding, pneumothorax, port malfunction, subclavian vein thrombosis, and non-healing. He understands and agrees to proceed.     Preparation:  The patient was seen in the preoperative holding area and the correct patient, correct operation were confirmed with the patient after reviewing the medical record and cxr. The consent was signed by me. Preoperative antibiotics were given.  The patient was taken back to the operating room and positioned supine on the operating room table. After being placed under general anesthesia by the anesthesia team the neck and chest were prepped with betadine soap and solution and draped in the usual sterile manner. A surgical time-out was taken and the correct patient and operative procedure and side were confirmed with the nursing and  anesthesia staff.   Insertion of left subclavian portacath:  A 3 cm transverse incision was made below the left clavicle and carried down through the subcutaneous tissue using electrocautery until the pectoralis fascia was encountered. A prepectoral pocket was developed inferiorly. The left subclavian vein was cannulated with a needle and a guidewire advanced into the right side of the heart under fluoroscopic guidance. A 10 F introducer and sheath was inserted over the wire under fluoroscopy. The wire and introducer were removed. A 9.6 F Power Port was cut to 20 cm length and flushed with heparin saline and inserted through the sheath under fluoroscopy until the tip was in the SVC. The sheath was removed. The port was flushed with 3 cc heparin solution ( 1000units/cc). The port was placed in the pocket and anchored to the pectoralis fascia with 2-0 silk sutures. Hemostasis was complete. The subcutaneous tissue was closed with continuous 3-0 vicryl suture. The skin was closed with 5-0 prolene subcuticular suture that was brought out at both ends of the incision and tacked to the skin with a steri-strip. Dermabond was applied to the incision.  Removal of right subclavian portacath:  The remainder of the incision was opened sharply. There was dehiscence of the subcutaneous tissue deeply but no sign of infection around the port. The old port and catheter were removed. The pocket was irrigated with saline. The pocket was obliterated with a 3-0 vicryl suture that tacked the subcutaneous tissue down to the pectoralis fascia. The subcutaneous tissue was closed with continuous 3-0 vicryl suture. The skin was closed with continuous 5-0 prolene in a similar manner as above. Dermabond was applied.  Completion:  The sponge, needle, and instrument counts were correct according to the nurses. The patient was awakened and transported to the PACU in satisfactory and stable condition.
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
## 2012-09-27 NOTE — Preoperative (Signed)
Beta Blockers   Reason not to administer Beta Blockers:Not Applicable, took coreg this am 

## 2012-09-27 NOTE — Interval H&P Note (Signed)
History and Physical Interval Note:  09/27/2012 7:15 AM  [name]  has presented today for surgery, with the diagnosis of recurrent adenocarcinoma of the lung  The various methods of treatment have been discussed with the patient and family. After consideration of risks, benefits and other options for treatment, the patient has consented to  Procedure(s): REMOVAL PORT-A-CATH (Right) INSERTION PORT-A-CATH (Left) as a surgical intervention .  The patient's history has been reviewed, patient examined, no change in status, stable for surgery.  I have reviewed the patient's chart and labs.  Questions were answered to the patient's satisfaction.     Alleen Borne

## 2012-09-27 NOTE — Brief Op Note (Signed)
09/27/2012  9:08 AM  PATIENT:  [name]  67 y.o. male  PRE-OPERATIVE DIAGNOSIS:  recurrent adenocarcinoma of the lung  POST-OPERATIVE DIAGNOSIS:  recurrent adenocarcinoma of the lung  PROCEDURE:  Procedure(s): REMOVAL PORT-A-CATH (Right) INSERTION PORT-A-CATH (Left)  SURGEON:  Surgeon(s) and Role:    * Alleen Borne, MD - Primary  PHYSICIAN ASSISTANT: none  ASSISTANTS: none   ANESTHESIA:   general  EBL:     BLOOD ADMINISTERED:none  DRAINS: none   LOCAL MEDICATIONS USED:  NONE  SPECIMEN:  No Specimen  DISPOSITION OF SPECIMEN:  N/A  COUNTS:  YES  TOURNIQUET:  * No tourniquets in log *   PLAN OF CARE: Discharge to home after PACU  PATIENT DISPOSITION:  PACU - hemodynamically stable.   Delay start of Pharmacological VTE agent (>24hrs) due to surgical blood loss or risk of bleeding: not applicable

## 2012-09-27 NOTE — Anesthesia Preprocedure Evaluation (Addendum)
Anesthesia Evaluation  Patient identified by MRN, date of birth, ID band Patient awake    Reviewed: Allergy & Precautions, H&P , NPO status , Patient's Chart, lab work & pertinent test results, reviewed documented beta blocker date and time   Airway Mallampati: II TM Distance: >3 FB Neck ROM: full    Dental  (+) Dental Advisory Given, Upper Dentures and Lower Dentures   Pulmonary shortness of breath, with exertion, lying and Long-Term Oxygen Therapy, sleep apnea , former smoker,  breath sounds clear to auscultation        Cardiovascular hypertension, Pt. on medications + Peripheral Vascular Disease     Neuro/Psych    GI/Hepatic negative GI ROS, Neg liver ROS,   Endo/Other  Morbid obesity  Renal/GU Renal disease     Musculoskeletal  (+) Arthritis -,   Abdominal (+)  Abdomen: soft. Bowel sounds: normal.  Peds  Hematology negative hematology ROS (+)   Anesthesia Other Findings   Reproductive/Obstetrics                         Anesthesia Physical Anesthesia Plan  ASA: III  Anesthesia Plan: General   Post-op Pain Management:    Induction: Intravenous  Airway Management Planned: LMA  Additional Equipment:   Intra-op Plan:   Post-operative Plan:   Informed Consent: I have reviewed the patients History and Physical, chart, labs and discussed the procedure including the risks, benefits and alternatives for the proposed anesthesia with the patient or authorized representative who has indicated his/her understanding and acceptance.     Plan Discussed with: CRNA, Anesthesiologist and Surgeon  Anesthesia Plan Comments:         Anesthesia Quick Evaluation

## 2012-09-29 ENCOUNTER — Encounter: Payer: Self-pay | Admitting: Physician Assistant

## 2012-10-01 ENCOUNTER — Other Ambulatory Visit (HOSPITAL_BASED_OUTPATIENT_CLINIC_OR_DEPARTMENT_OTHER): Payer: Medicare Other

## 2012-10-01 ENCOUNTER — Ambulatory Visit (INDEPENDENT_AMBULATORY_CARE_PROVIDER_SITE_OTHER): Payer: Self-pay | Admitting: Physician Assistant

## 2012-10-01 DIAGNOSIS — C801 Malignant (primary) neoplasm, unspecified: Secondary | ICD-10-CM

## 2012-10-01 DIAGNOSIS — C799 Secondary malignant neoplasm of unspecified site: Secondary | ICD-10-CM

## 2012-10-01 DIAGNOSIS — C3490 Malignant neoplasm of unspecified part of unspecified bronchus or lung: Secondary | ICD-10-CM

## 2012-10-01 DIAGNOSIS — Z09 Encounter for follow-up examination after completed treatment for conditions other than malignant neoplasm: Secondary | ICD-10-CM

## 2012-10-01 DIAGNOSIS — C3492 Malignant neoplasm of unspecified part of left bronchus or lung: Secondary | ICD-10-CM

## 2012-10-01 DIAGNOSIS — C3430 Malignant neoplasm of lower lobe, unspecified bronchus or lung: Secondary | ICD-10-CM

## 2012-10-01 LAB — COMPREHENSIVE METABOLIC PANEL (CC13)
ALT: 16 U/L (ref 0–55)
AST: 26 U/L (ref 5–34)
Alkaline Phosphatase: 86 U/L (ref 40–150)
BUN: 21 mg/dL (ref 7.0–26.0)
Calcium: 9.5 mg/dL (ref 8.4–10.4)
Chloride: 106 mEq/L (ref 98–109)
Creatinine: 1.2 mg/dL (ref 0.7–1.3)

## 2012-10-01 LAB — CBC WITH DIFFERENTIAL/PLATELET
EOS%: 2.5 % (ref 0.0–7.0)
Eosinophils Absolute: 0 10*3/uL (ref 0.0–0.5)
MCH: 31.6 pg (ref 27.2–33.4)
MCV: 93.9 fL (ref 79.3–98.0)
MONO%: 8.1 % (ref 0.0–14.0)
NEUT#: 0.8 10*3/uL — ABNORMAL LOW (ref 1.5–6.5)
RBC: 3.12 10*6/uL — ABNORMAL LOW (ref 4.20–5.82)
RDW: 17.5 % — ABNORMAL HIGH (ref 11.0–14.6)

## 2012-10-01 NOTE — Progress Notes (Signed)
  HPI: Patient returns for routine postoperative follow-up having undergone insertion of porta catheter on the left and removal of a right sided porta cath on 09/27/2012.  The patient contacted our office with a complaint of bleeding from his right porta cath site that was recently removed.  The patient states there has not been a lot of drainage, but he noticed blood today when he went to change the dressing.  He denies fevers, chills, sweats, or purulent drainage.  Current Outpatient Prescriptions  Medication Sig Dispense Refill  . aspirin EC 81 MG tablet Take 81 mg by mouth daily.      Marland Kitchen atorvastatin (LIPITOR) 40 MG tablet Take 40 mg by mouth daily.      . carvedilol (COREG) 3.125 MG tablet Take 3.125 mg by mouth 2 (two) times daily with a meal.       . digoxin (LANOXIN) 0.125 MG tablet Take 0.5 tablets (0.0625 mg total) by mouth daily.  30 tablet  3  . furosemide (LASIX) 40 MG tablet Take 1 tablet (40 mg total) by mouth 2 (two) times daily.  60 tablet  3  . lidocaine-prilocaine (EMLA) cream Apply 1 application topically as needed (for port-a-cath access).       Marland Kitchen oxyCODONE (OXY IR/ROXICODONE) 5 MG immediate release tablet Take 1 tablet (5 mg total) by mouth every 4 (four) hours as needed for pain.  30 tablet  0  . potassium chloride SA (K-DUR,KLOR-CON) 20 MEQ tablet Take 1 tablet (20 mEq total) by mouth 2 (two) times daily.  60 tablet  3  . PRESCRIPTION MEDICATION Inject into the vein every 21 ( twenty-one) days. Taxol, Avastin and Paraplatin infusion q3weeks.      . ramipril (ALTACE) 2.5 MG capsule Take 1 capsule (2.5 mg total) by mouth daily.  30 capsule  3  . temazepam (RESTORIL) 30 MG capsule Take 1 capsule (30 mg total) by mouth at bedtime as needed for sleep.  30 capsule  0  . cephALEXin (KEFLEX) 500 MG capsule Take 500 mg by mouth daily.        No current facility-administered medications for this visit.   Facility-Administered Medications Ordered in Other Visits  Medication Dose Route  Frequency Provider Last Rate Last Dose  . sodium chloride 0.9 % injection 10 mL  10 mL Intracatheter PRN Si Gaul, MD   10 mL at 09/03/12 1534    Physical Exam:  BP 130/66  Pulse 88  Resp 20  Ht 5\' 9"  (1.753 m)  Wt 317 lb (143.79 kg)  BMI 46.79 kg/m2  Gen: no apparent distress Skin: Left skin incision is clean and dry         Right skin incision has some mild dehiscence along there lateral edges on the right and left side.  The incision was clean with no evidence of frank drainage or erythema present.  A/P:  1. Minor dehiscence of right porta cath removal incision- no evidence of infection present.  Instructed patient to keep wound clean and dry.  I applied steri strips to incision.  Patient will return to clinic as scheduled for follow up with Dr. Laneta Simmers.  Should he develop purulent drainage, erythema, or fevers he was instructed to contact our office immediately

## 2012-10-08 ENCOUNTER — Other Ambulatory Visit (HOSPITAL_BASED_OUTPATIENT_CLINIC_OR_DEPARTMENT_OTHER): Payer: Medicare Other

## 2012-10-08 ENCOUNTER — Other Ambulatory Visit: Payer: Self-pay | Admitting: *Deleted

## 2012-10-08 DIAGNOSIS — C3492 Malignant neoplasm of unspecified part of left bronchus or lung: Secondary | ICD-10-CM

## 2012-10-08 DIAGNOSIS — C3430 Malignant neoplasm of lower lobe, unspecified bronchus or lung: Secondary | ICD-10-CM

## 2012-10-08 LAB — COMPREHENSIVE METABOLIC PANEL (CC13)
ALT: 17 U/L (ref 0–55)
AST: 26 U/L (ref 5–34)
BUN: 19.1 mg/dL (ref 7.0–26.0)
Calcium: 9.3 mg/dL (ref 8.4–10.4)
Chloride: 101 mEq/L (ref 98–109)
Creatinine: 1.2 mg/dL (ref 0.7–1.3)
Glucose: 105 mg/dl (ref 70–140)
Total Bilirubin: 0.61 mg/dL (ref 0.20–1.20)

## 2012-10-08 LAB — CBC WITH DIFFERENTIAL/PLATELET
BASO%: 0.4 % (ref 0.0–2.0)
Basophils Absolute: 0 10*3/uL (ref 0.0–0.1)
EOS%: 0.2 % (ref 0.0–7.0)
HCT: 30.8 % — ABNORMAL LOW (ref 38.4–49.9)
HGB: 10.4 g/dL — ABNORMAL LOW (ref 13.0–17.1)
LYMPH%: 18.7 % (ref 14.0–49.0)
MCH: 31.7 pg (ref 27.2–33.4)
MCHC: 33.7 g/dL (ref 32.0–36.0)
MCV: 94.1 fL (ref 79.3–98.0)
MONO%: 12.6 % (ref 0.0–14.0)
NEUT%: 68.1 % (ref 39.0–75.0)
Platelets: 133 10*3/uL — ABNORMAL LOW (ref 140–400)

## 2012-10-09 ENCOUNTER — Ambulatory Visit (INDEPENDENT_AMBULATORY_CARE_PROVIDER_SITE_OTHER): Payer: Medicare Other | Admitting: Surgery

## 2012-10-09 ENCOUNTER — Encounter: Payer: Self-pay | Admitting: Surgery

## 2012-10-09 VITALS — BP 134/79 | HR 93 | Resp 18 | Ht 69.0 in | Wt 317.0 lb

## 2012-10-09 DIAGNOSIS — Z95828 Presence of other vascular implants and grafts: Secondary | ICD-10-CM

## 2012-10-09 DIAGNOSIS — Z09 Encounter for follow-up examination after completed treatment for conditions other than malignant neoplasm: Secondary | ICD-10-CM

## 2012-10-09 DIAGNOSIS — Z9889 Other specified postprocedural states: Secondary | ICD-10-CM

## 2012-10-09 DIAGNOSIS — C3490 Malignant neoplasm of unspecified part of unspecified bronchus or lung: Secondary | ICD-10-CM

## 2012-10-11 ENCOUNTER — Encounter: Payer: Self-pay | Admitting: Surgery

## 2012-10-11 NOTE — Progress Notes (Signed)
      [address]       [name] [address]             [phone]        HPI:  He returns for follow-up today after having a new left subclavian porta-cath placed and his old right subclavian porta-cath removed on 09/27/2012. He has been feeling well and denies fever or chills.  Current Outpatient Prescriptions  Medication Sig Dispense Refill  . aspirin EC 81 MG tablet Take 81 mg by mouth daily.      Marland Kitchen atorvastatin (LIPITOR) 40 MG tablet Take 40 mg by mouth daily.      . carvedilol (COREG) 3.125 MG tablet Take 3.125 mg by mouth 2 (two) times daily with a meal.       . cephALEXin (KEFLEX) 500 MG capsule Take 500 mg by mouth daily.       . digoxin (LANOXIN) 0.125 MG tablet Take 0.5 tablets (0.0625 mg total) by mouth daily.  30 tablet  3  . furosemide (LASIX) 40 MG tablet Take 1 tablet (40 mg total) by mouth 2 (two) times daily.  60 tablet  3  . lidocaine-prilocaine (EMLA) cream Apply 1 application topically as needed (for port-a-cath access).       Marland Kitchen oxyCODONE (OXY IR/ROXICODONE) 5 MG immediate release tablet Take 1 tablet (5 mg total) by mouth every 4 (four) hours as needed for pain.  30 tablet  0  . potassium chloride SA (K-DUR,KLOR-CON) 20 MEQ tablet Take 1 tablet (20 mEq total) by mouth 2 (two) times daily.  60 tablet  3  . PRESCRIPTION MEDICATION Inject into the vein every 21 ( twenty-one) days. Taxol, Avastin and Paraplatin infusion q3weeks.      . ramipril (ALTACE) 2.5 MG capsule Take 1 capsule (2.5 mg total) by mouth daily.  30 capsule  3  . temazepam (RESTORIL) 30 MG capsule Take 1 capsule (30 mg total) by mouth at bedtime as needed for sleep.  30 capsule  0   No current facility-administered medications for this visit.   Facility-Administered Medications Ordered in Other Visits  Medication Dose Route Frequency Provider Last Rate Last Dose  . sodium chloride 0.9 % injection 10 mL  10 mL Intracatheter PRN Si Gaul, MD   10 mL at 09/03/12 1534      Physical Exam: BP 134/79  Pulse 93  Resp 18  Ht 5\' 9"  (1.753 m)  Wt 317 lb (143.79 kg)  BMI 46.79 kg/m2  SpO2 89% The right chest incision has slight separation of the skin. There is minimal serous drainage, no erythema or tenderness. The prolene subcuticular suture was removed.  The left chest incision is intact except for slight separation at the lateral pole of the incision. There is no drainage. I removed the subcuticular prolene suture. The porta-cath site is non-tender with no swelling.  Diagnostic Tests: none  Impression:  I think things are healing ok at this time.  Plan:  He will return to see me in 1-2 weeks to reexamine the incisions.

## 2012-10-15 ENCOUNTER — Other Ambulatory Visit (HOSPITAL_BASED_OUTPATIENT_CLINIC_OR_DEPARTMENT_OTHER): Payer: Medicare Other | Admitting: Lab

## 2012-10-15 DIAGNOSIS — C3490 Malignant neoplasm of unspecified part of unspecified bronchus or lung: Secondary | ICD-10-CM

## 2012-10-15 DIAGNOSIS — C3492 Malignant neoplasm of unspecified part of left bronchus or lung: Secondary | ICD-10-CM

## 2012-10-15 LAB — CBC WITH DIFFERENTIAL/PLATELET
Basophils Absolute: 0 10*3/uL (ref 0.0–0.1)
EOS%: 0.2 % (ref 0.0–7.0)
HCT: 30.1 % — ABNORMAL LOW (ref 38.4–49.9)
HGB: 10 g/dL — ABNORMAL LOW (ref 13.0–17.1)
LYMPH%: 25.5 % (ref 14.0–49.0)
MCH: 31.2 pg (ref 27.2–33.4)
MCHC: 33.1 g/dL (ref 32.0–36.0)
MONO#: 0.7 10*3/uL (ref 0.1–0.9)
NEUT%: 55.4 % (ref 39.0–75.0)
Platelets: 202 10*3/uL (ref 140–400)
lymph#: 0.9 10*3/uL (ref 0.9–3.3)

## 2012-10-15 LAB — COMPREHENSIVE METABOLIC PANEL (CC13)
AST: 24 U/L (ref 5–34)
Alkaline Phosphatase: 87 U/L (ref 40–150)
BUN: 19.2 mg/dL (ref 7.0–26.0)
Glucose: 114 mg/dl (ref 70–140)
Sodium: 139 mEq/L (ref 136–145)
Total Bilirubin: 0.66 mg/dL (ref 0.20–1.20)

## 2012-10-16 ENCOUNTER — Ambulatory Visit (HOSPITAL_BASED_OUTPATIENT_CLINIC_OR_DEPARTMENT_OTHER): Payer: Medicare Other | Admitting: Physician Assistant

## 2012-10-16 ENCOUNTER — Telehealth: Payer: Self-pay | Admitting: *Deleted

## 2012-10-16 ENCOUNTER — Ambulatory Visit: Payer: Medicare Other

## 2012-10-16 ENCOUNTER — Other Ambulatory Visit: Payer: Self-pay | Admitting: Physician Assistant

## 2012-10-16 ENCOUNTER — Ambulatory Visit (HOSPITAL_BASED_OUTPATIENT_CLINIC_OR_DEPARTMENT_OTHER): Payer: Medicare Other

## 2012-10-16 ENCOUNTER — Other Ambulatory Visit: Payer: Self-pay | Admitting: Medical Oncology

## 2012-10-16 DIAGNOSIS — C3430 Malignant neoplasm of lower lobe, unspecified bronchus or lung: Secondary | ICD-10-CM

## 2012-10-16 DIAGNOSIS — G609 Hereditary and idiopathic neuropathy, unspecified: Secondary | ICD-10-CM

## 2012-10-16 DIAGNOSIS — Z5112 Encounter for antineoplastic immunotherapy: Secondary | ICD-10-CM

## 2012-10-16 DIAGNOSIS — J449 Chronic obstructive pulmonary disease, unspecified: Secondary | ICD-10-CM

## 2012-10-16 DIAGNOSIS — Z5111 Encounter for antineoplastic chemotherapy: Secondary | ICD-10-CM

## 2012-10-16 MED ORDER — PACLITAXEL CHEMO INJECTION 300 MG/50ML
150.0000 mg/m2 | Freq: Once | INTRAVENOUS | Status: AC
  Administered 2012-10-16: 408 mg via INTRAVENOUS
  Filled 2012-10-16: qty 68

## 2012-10-16 MED ORDER — DEXAMETHASONE SODIUM PHOSPHATE 20 MG/5ML IJ SOLN
INTRAMUSCULAR | Status: AC
  Filled 2012-10-16: qty 5

## 2012-10-16 MED ORDER — ONDANSETRON 16 MG/50ML IVPB (CHCC)
16.0000 mg | Freq: Once | INTRAVENOUS | Status: AC
  Administered 2012-10-16: 16 mg via INTRAVENOUS

## 2012-10-16 MED ORDER — SODIUM CHLORIDE 0.9 % IV SOLN
Freq: Once | INTRAVENOUS | Status: AC
  Administered 2012-10-16: 10:00:00 via INTRAVENOUS

## 2012-10-16 MED ORDER — GABAPENTIN 100 MG PO CAPS: 100.0000 mg | ORAL_CAPSULE | Freq: Three times a day (TID) | ORAL | Status: DC

## 2012-10-16 MED ORDER — FAMOTIDINE IN NACL 20-0.9 MG/50ML-% IV SOLN
20.0000 mg | Freq: Once | INTRAVENOUS | Status: AC
  Administered 2012-10-16: 20 mg via INTRAVENOUS

## 2012-10-16 MED ORDER — SODIUM CHLORIDE 0.9 % IV SOLN
570.0000 mg | Freq: Once | INTRAVENOUS | Status: AC
  Administered 2012-10-16: 570 mg via INTRAVENOUS
  Filled 2012-10-16: qty 57

## 2012-10-16 MED ORDER — HEPARIN SOD (PORK) LOCK FLUSH 100 UNIT/ML IV SOLN
500.0000 [IU] | Freq: Once | INTRAVENOUS | Status: AC | PRN
  Administered 2012-10-16: 500 [IU]
  Filled 2012-10-16: qty 5

## 2012-10-16 MED ORDER — DIPHENHYDRAMINE HCL 50 MG/ML IJ SOLN
50.0000 mg | Freq: Once | INTRAMUSCULAR | Status: AC
  Administered 2012-10-16: 50 mg via INTRAVENOUS

## 2012-10-16 MED ORDER — SODIUM CHLORIDE 0.9 % IV SOLN
15.0000 mg/kg | Freq: Once | INTRAVENOUS | Status: AC
  Administered 2012-10-16: 2225 mg via INTRAVENOUS
  Filled 2012-10-16: qty 89

## 2012-10-16 MED ORDER — DIPHENHYDRAMINE HCL 50 MG/ML IJ SOLN
INTRAMUSCULAR | Status: AC
  Filled 2012-10-16: qty 1

## 2012-10-16 MED ORDER — ONDANSETRON 16 MG/50ML IVPB (CHCC)
INTRAVENOUS | Status: AC
  Filled 2012-10-16: qty 16

## 2012-10-16 MED ORDER — FAMOTIDINE IN NACL 20-0.9 MG/50ML-% IV SOLN
INTRAVENOUS | Status: AC
  Filled 2012-10-16: qty 50

## 2012-10-16 MED ORDER — SODIUM CHLORIDE 0.9 % IJ SOLN
10.0000 mL | INTRAMUSCULAR | Status: DC | PRN
  Administered 2012-10-16: 10 mL
  Filled 2012-10-16: qty 10

## 2012-10-16 MED ORDER — DEXAMETHASONE SODIUM PHOSPHATE 20 MG/5ML IJ SOLN
20.0000 mg | Freq: Once | INTRAMUSCULAR | Status: AC
  Administered 2012-10-16: 20 mg via INTRAVENOUS

## 2012-10-16 NOTE — Progress Notes (Signed)
small hole at lateral end of port incision ( left chest ) 0-no draining or tenderness. Port accessed without problems and flushed without any drainage. No fever today -denies chills (He says he has an antibiotic that he takes- but not every day. Per [facility] pt picked up keflex on 8/25 #20. Pt states he still as some left.)

## 2012-10-16 NOTE — Telephone Encounter (Signed)
miskae 

## 2012-10-16 NOTE — Patient Instructions (Addendum)
Followup with Dr. Arbutus Ped in 3 weeks with a restaging CT scan of your chest to reevaluate your disease Continue weekly labs as scheduled Followup with Dr. Laneta Simmers as scheduled on 10/23/2012
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
## 2012-10-16 NOTE — Patient Instructions (Addendum)
[facility] Discharge Instructions for Patients Receiving Chemotherapy  Today you received the following chemotherapy agents Taxol, carboplatin, and Avastin.  To help prevent nausea and vomiting after your treatment, we encourage you to take your nausea medication  as ordered. If you develop nausea and vomiting that is not controlled by your nausea medication, call the clinic.   BELOW ARE SYMPTOMS THAT SHOULD BE REPORTED IMMEDIATELY:  *FEVER GREATER THAN 100.5 F  *CHILLS WITH OR WITHOUT FEVER  NAUSEA AND VOMITING THAT IS NOT CONTROLLED WITH YOUR NAUSEA MEDICATION  *UNUSUAL SHORTNESS OF BREATH  *UNUSUAL BRUISING OR BLEEDING  TENDERNESS IN MOUTH AND THROAT WITH OR WITHOUT PRESENCE OF ULCERS  *URINARY PROBLEMS  *BOWEL PROBLEMS  UNUSUAL RASH Items with * indicate a potential emergency and should be followed up as soon as possible.  Feel free to call the clinic you have any questions or concerns. The clinic phone number is [phone].

## 2012-10-16 NOTE — Telephone Encounter (Signed)
I called  -[facility] and pt picked up keflex on 8/5 # 20 .

## 2012-10-17 ENCOUNTER — Other Ambulatory Visit: Payer: Self-pay | Admitting: Certified Registered Nurse Anesthetist

## 2012-10-17 ENCOUNTER — Telehealth: Payer: Self-pay | Admitting: Internal Medicine

## 2012-10-17 ENCOUNTER — Ambulatory Visit (HOSPITAL_BASED_OUTPATIENT_CLINIC_OR_DEPARTMENT_OTHER): Payer: Medicare Other

## 2012-10-17 DIAGNOSIS — C3490 Malignant neoplasm of unspecified part of unspecified bronchus or lung: Secondary | ICD-10-CM

## 2012-10-17 MED ORDER — PEGFILGRASTIM INJECTION 6 MG/0.6ML
6.0000 mg | Freq: Once | SUBCUTANEOUS | Status: AC
  Administered 2012-10-17: 6 mg via SUBCUTANEOUS
  Filled 2012-10-17: qty 0.6

## 2012-10-17 NOTE — Telephone Encounter (Signed)
, °

## 2012-10-22 ENCOUNTER — Other Ambulatory Visit: Payer: Medicare Other | Admitting: Lab

## 2012-10-23 ENCOUNTER — Encounter: Payer: Self-pay | Admitting: Surgery

## 2012-10-23 ENCOUNTER — Ambulatory Visit (INDEPENDENT_AMBULATORY_CARE_PROVIDER_SITE_OTHER): Payer: Medicare Other | Admitting: Surgery

## 2012-10-23 VITALS — BP 127/70 | HR 90 | Resp 20 | Ht 69.0 in | Wt 317.0 lb

## 2012-10-23 DIAGNOSIS — C3490 Malignant neoplasm of unspecified part of unspecified bronchus or lung: Secondary | ICD-10-CM

## 2012-10-23 DIAGNOSIS — Z95828 Presence of other vascular implants and grafts: Secondary | ICD-10-CM

## 2012-10-23 DIAGNOSIS — Z9889 Other specified postprocedural states: Secondary | ICD-10-CM

## 2012-10-23 DIAGNOSIS — Z09 Encounter for follow-up examination after completed treatment for conditions other than malignant neoplasm: Secondary | ICD-10-CM

## 2012-10-23 NOTE — Progress Notes (Signed)
[address]       [name] [address]             [phone]        HPI:  The patient returns for followup today after removal of an exposed Port-A-Cath in the right anterior chest wall and insertion of a new left subclavian vein Port-A-Cath on 09/27/2012. The initial incision did not heal. He said that he completed his last dose of chemotherapy yesterday and is going to have a repeat CT scan or PET scan done to reevaluate his cancer. He has noted no significant drainage from the right chest incision which had partially opened up when I saw him last time. He denies any fever or chills.  Current Outpatient Prescriptions  Medication Sig Dispense Refill  . aspirin EC 81 MG tablet Take 81 mg by mouth daily.      Marland Kitchen atorvastatin (LIPITOR) 40 MG tablet Take 40 mg by mouth daily.      . carvedilol (COREG) 3.125 MG tablet Take 3.125 mg by mouth 2 (two) times daily with a meal.       . cephALEXin (KEFLEX) 500 MG capsule Take 500 mg by mouth daily.       . digoxin (LANOXIN) 0.125 MG tablet Take 0.5 tablets (0.0625 mg total) by mouth daily.  30 tablet  3  . furosemide (LASIX) 40 MG tablet Take 1 tablet (40 mg total) by mouth 2 (two) times daily.  60 tablet  3  . gabapentin (NEURONTIN) 100 MG capsule Take 1 capsule (100 mg total) by mouth 3 (three) times daily.  90 capsule  1  . lidocaine-prilocaine (EMLA) cream Apply 1 application topically as needed (for port-a-cath access).       Marland Kitchen oxyCODONE (OXY IR/ROXICODONE) 5 MG immediate release tablet Take 1 tablet (5 mg total) by mouth every 4 (four) hours as needed for pain.  30 tablet  0  . potassium chloride SA (K-DUR,KLOR-CON) 20 MEQ tablet Take 1 tablet (20 mEq total) by mouth 2 (two) times daily.  60 tablet  3  . PRESCRIPTION MEDICATION Inject into the vein every 21 ( twenty-one) days. Taxol, Avastin and Paraplatin infusion q3weeks.      . ramipril (ALTACE) 2.5 MG capsule Take 1 capsule (2.5 mg total) by mouth daily.  30 capsule   3  . temazepam (RESTORIL) 30 MG capsule Take 1 capsule (30 mg total) by mouth at bedtime as needed for sleep.  30 capsule  0   No current facility-administered medications for this visit.   Facility-Administered Medications Ordered in Other Visits  Medication Dose Route Frequency Provider Last Rate Last Dose  . sodium chloride 0.9 % injection 10 mL  10 mL Intracatheter PRN Si Gaul, MD   10 mL at 09/03/12 1534     Physical Exam: BP 127/70  Pulse 90  Resp 20  Ht 5\' 9"  (1.753 m)  Wt 317 lb (143.79 kg)  BMI 46.79 kg/m2  SpO2 % He looks well. The right anterior chest wall incision is open but superficial and granulating. There is minimal drainage. The left chest wall Port-A-Cath incision is healing well.  Diagnostic Tests:  none  Impression:  The left chest wall Port-A-Cath incision appears to be healing well and the Port-A-Cath is working appropriately. The right chest wall incision is partially opened with minimal drainage. I advised him to keep this clean with soap and water and cover with a dry dressing. Hopefully this will heal  completely over the next few weeks.  Plan: I'll plan to see him back in 2 weeks for reexamination of the wound.

## 2012-10-29 ENCOUNTER — Other Ambulatory Visit: Payer: Self-pay | Admitting: Physician Assistant

## 2012-10-29 ENCOUNTER — Telehealth: Payer: Self-pay | Admitting: *Deleted

## 2012-10-29 ENCOUNTER — Other Ambulatory Visit (HOSPITAL_BASED_OUTPATIENT_CLINIC_OR_DEPARTMENT_OTHER): Payer: Medicare Other | Admitting: Lab

## 2012-10-29 DIAGNOSIS — C3490 Malignant neoplasm of unspecified part of unspecified bronchus or lung: Secondary | ICD-10-CM

## 2012-10-29 DIAGNOSIS — C3492 Malignant neoplasm of unspecified part of left bronchus or lung: Secondary | ICD-10-CM

## 2012-10-29 LAB — COMPREHENSIVE METABOLIC PANEL (CC13)
ALT: 12 U/L (ref 0–55)
Albumin: 3.4 g/dL — ABNORMAL LOW (ref 3.5–5.0)
Alkaline Phosphatase: 88 U/L (ref 40–150)
CO2: 25 mEq/L (ref 22–29)
Calcium: 9.1 mg/dL (ref 8.4–10.4)
Potassium: 4 mEq/L (ref 3.5–5.1)
Sodium: 140 mEq/L (ref 136–145)
Total Bilirubin: 0.57 mg/dL (ref 0.20–1.20)
Total Protein: 7.8 g/dL (ref 6.4–8.3)

## 2012-10-29 LAB — CBC WITH DIFFERENTIAL/PLATELET
BASO%: 0.2 % (ref 0.0–2.0)
Basophils Absolute: 0 10*3/uL (ref 0.0–0.1)
EOS%: 0.4 % (ref 0.0–7.0)
HGB: 9.1 g/dL — ABNORMAL LOW (ref 13.0–17.1)
MCH: 30.8 pg (ref 27.2–33.4)
MCHC: 31.6 g/dL — ABNORMAL LOW (ref 32.0–36.0)
MONO#: 0.6 10*3/uL (ref 0.1–0.9)
RDW: 20 % — ABNORMAL HIGH (ref 11.0–14.6)
WBC: 5.3 10*3/uL (ref 4.0–10.3)
lymph#: 1.1 10*3/uL (ref 0.9–3.3)

## 2012-10-29 LAB — UA PROTEIN, DIPSTICK - CHCC: Protein, ur: NEGATIVE mg/dL

## 2012-10-29 NOTE — Telephone Encounter (Signed)
PT. HAS HAD SWELLING, TIGHTNESS, AND PAIN FOR SEVERAL WEEKS. ELEVATING LEGS, "MEDICAL SOCKS", OR MEDICATION DOES NOT HELP. THE PAIN AT A SCALE OF TEN WAKES PT. UP AT NIGHT. VERBAL ORDER AND READ BACK TO ADRENA JOHNSON,PA- THE SWELLING IS NOT RELATED TO PT.'S CHEMOTHERAPY. PT. NEEDS TO CONTACT HIS PRIMARY CARE PHYSICIAN, CARDIOLOGIST, OR GO TO THE EMERGENCY ROOM TO BE EVALUATED. NOTIFIED PT. HE VOICES UNDERSTANDING.

## 2012-10-31 ENCOUNTER — Other Ambulatory Visit: Payer: Self-pay

## 2012-10-31 ENCOUNTER — Encounter (HOSPITAL_COMMUNITY): Payer: Self-pay | Admitting: Emergency Medicine

## 2012-10-31 ENCOUNTER — Emergency Department (HOSPITAL_COMMUNITY): Payer: Medicare Other

## 2012-10-31 ENCOUNTER — Emergency Department (HOSPITAL_COMMUNITY)
Admission: EM | Admit: 2012-10-31 | Discharge: 2012-10-31 | Disposition: A | Discharge status: Home / Self Care | Payer: Medicare Other | Attending: Emergency Medicine | Admitting: Emergency Medicine

## 2012-10-31 DIAGNOSIS — Z7982 Long term (current) use of aspirin: Secondary | ICD-10-CM | POA: Insufficient documentation

## 2012-10-31 DIAGNOSIS — Z87442 Personal history of urinary calculi: Secondary | ICD-10-CM | POA: Insufficient documentation

## 2012-10-31 DIAGNOSIS — R609 Edema, unspecified: Secondary | ICD-10-CM | POA: Insufficient documentation

## 2012-10-31 DIAGNOSIS — M129 Arthropathy, unspecified: Secondary | ICD-10-CM | POA: Insufficient documentation

## 2012-10-31 DIAGNOSIS — Z85118 Personal history of other malignant neoplasm of bronchus and lung: Secondary | ICD-10-CM | POA: Insufficient documentation

## 2012-10-31 DIAGNOSIS — I509 Heart failure, unspecified: Secondary | ICD-10-CM | POA: Insufficient documentation

## 2012-10-31 DIAGNOSIS — Z79899 Other long term (current) drug therapy: Secondary | ICD-10-CM | POA: Insufficient documentation

## 2012-10-31 DIAGNOSIS — J441 Chronic obstructive pulmonary disease with (acute) exacerbation: Secondary | ICD-10-CM | POA: Insufficient documentation

## 2012-10-31 DIAGNOSIS — R600 Localized edema: Secondary | ICD-10-CM

## 2012-10-31 DIAGNOSIS — Z792 Long term (current) use of antibiotics: Secondary | ICD-10-CM | POA: Insufficient documentation

## 2012-10-31 DIAGNOSIS — Z87891 Personal history of nicotine dependence: Secondary | ICD-10-CM | POA: Insufficient documentation

## 2012-10-31 DIAGNOSIS — R011 Cardiac murmur, unspecified: Secondary | ICD-10-CM | POA: Insufficient documentation

## 2012-10-31 DIAGNOSIS — R0602 Shortness of breath: Secondary | ICD-10-CM

## 2012-10-31 DIAGNOSIS — I10 Essential (primary) hypertension: Secondary | ICD-10-CM | POA: Insufficient documentation

## 2012-10-31 DIAGNOSIS — E876 Hypokalemia: Secondary | ICD-10-CM | POA: Insufficient documentation

## 2012-10-31 DIAGNOSIS — E785 Hyperlipidemia, unspecified: Secondary | ICD-10-CM | POA: Insufficient documentation

## 2012-10-31 DIAGNOSIS — G4700 Insomnia, unspecified: Secondary | ICD-10-CM | POA: Insufficient documentation

## 2012-10-31 LAB — CBC WITH DIFFERENTIAL/PLATELET
Basophils Absolute: 0 10*3/uL (ref 0.0–0.1)
Basophils Relative: 0 % (ref 0–1)
Eosinophils Relative: 1 % (ref 0–5)
HCT: 31.3 % — ABNORMAL LOW (ref 39.0–52.0)
MCHC: 32.6 g/dL (ref 30.0–36.0)
MCV: 96.3 fL (ref 78.0–100.0)
Monocytes Absolute: 0.6 10*3/uL (ref 0.1–1.0)
Monocytes Relative: 10 % (ref 3–12)
Neutrophils Relative %: 65 % (ref 43–77)
Platelets: DECREASED 10*3/uL (ref 150–400)
RDW: 19.6 % — ABNORMAL HIGH (ref 11.5–15.5)
WBC: 5.6 10*3/uL (ref 4.0–10.5)

## 2012-10-31 LAB — BASIC METABOLIC PANEL
BUN: 35 mg/dL — ABNORMAL HIGH (ref 6–23)
Calcium: 9.2 mg/dL (ref 8.4–10.5)
Creatinine, Ser: 1.56 mg/dL — ABNORMAL HIGH (ref 0.50–1.35)
GFR calc Af Amer: 51 mL/min — ABNORMAL LOW (ref >90)
GFR calc non Af Amer: 44 mL/min — ABNORMAL LOW (ref >90)

## 2012-10-31 LAB — POCT I-STAT TROPONIN I

## 2012-10-31 LAB — PRO B NATRIURETIC PEPTIDE: Pro B Natriuretic peptide (BNP): 46.2 pg/mL (ref 0–125)

## 2012-10-31 MED ORDER — FUROSEMIDE 10 MG/ML IJ SOLN
80.0000 mg | Freq: Once | INTRAMUSCULAR | Status: AC
  Administered 2012-10-31: 80 mg via INTRAVENOUS
  Filled 2012-10-31: qty 8

## 2012-10-31 MED ORDER — ENOXAPARIN SODIUM 150 MG/ML ~~LOC~~ SOLN
1.0000 mg/kg | Freq: Once | SUBCUTANEOUS | Status: AC
  Administered 2012-10-31: 150 mg via SUBCUTANEOUS
  Filled 2012-10-31: qty 1

## 2012-10-31 MED ORDER — NITROGLYCERIN 2 % TD OINT
0.5000 [in_us] | TOPICAL_OINTMENT | Freq: Once | TRANSDERMAL | Status: AC
  Administered 2012-10-31: 0.5 [in_us] via TOPICAL
  Filled 2012-10-31: qty 30

## 2012-10-31 MED ORDER — IOHEXOL 350 MG/ML SOLN: 100.0000 mL | Freq: Once | INTRAVENOUS | Status: DC | PRN

## 2012-10-31 NOTE — ED Notes (Signed)
PA made aware that rn, charge, and IV team could not access port

## 2012-10-31 NOTE — ED Notes (Signed)
Patient transported to X-ray 

## 2012-10-31 NOTE — ED Provider Notes (Signed)
Patient has history of congestive heart failure and he reports swelling of his lower extremities with shortness of breath over the past 2-3 weeks. He reports he is followed by Dr. Sharyn Lull. He is chronically on home oxygen at 3 L per minute nasal cannula. He states he is unable to lay flat to breathe at night and has to sit up.  Patient is sitting at the bedside. He has diminished breath sounds diffusely without wheezing or rales. He's noted to have marked edema of his lower extremities bilaterally.  Medical screening examination/treatment/procedure(s) were conducted as a shared visit with non-physician practitioner(s) and myself.  I personally evaluated the patient during the encounter  Devoria Albe, MD, Franz Dell, MD 10/31/12 [phone]
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
## 2012-10-31 NOTE — ED Notes (Signed)
PT c/o of SOB since this am. Lung cancer pt 77%RA. Normally wears 3L O2 at home. Denies chest pain. AAOx4.

## 2012-11-01 ENCOUNTER — Ambulatory Visit (HOSPITAL_COMMUNITY): Payer: Medicare Other

## 2012-11-01 ENCOUNTER — Ambulatory Visit (HOSPITAL_COMMUNITY)
Admission: RE | Admit: 2012-11-01 | Discharge: 2012-11-01 | Disposition: A | Discharge status: Home / Self Care | Payer: Medicare Other | Source: Ambulatory Visit | Attending: Emergency Medicine | Admitting: Emergency Medicine

## 2012-11-01 DIAGNOSIS — M7989 Other specified soft tissue disorders: Secondary | ICD-10-CM

## 2012-11-01 DIAGNOSIS — I509 Heart failure, unspecified: Secondary | ICD-10-CM

## 2012-11-01 NOTE — Progress Notes (Signed)
*  Preliminary Results* Bilateral lower extremity venous duplex completed. Bilateral lower extremities are negative for deep vein thrombosis. There is no evidence of Baker's cyst bilaterally.  11/01/2012  Simmie Camerer, RVT, RDCS, RDMS  

## 2012-11-04 ENCOUNTER — Encounter (HOSPITAL_COMMUNITY): Payer: Self-pay

## 2012-11-04 ENCOUNTER — Ambulatory Visit (HOSPITAL_COMMUNITY)
Admission: RE | Admit: 2012-11-04 | Discharge: 2012-11-04 | Disposition: A | Discharge status: Home / Self Care | Payer: Medicare Other | Source: Ambulatory Visit | Attending: Physician Assistant | Admitting: Physician Assistant

## 2012-11-04 DIAGNOSIS — I517 Cardiomegaly: Secondary | ICD-10-CM | POA: Insufficient documentation

## 2012-11-04 DIAGNOSIS — Z902 Acquired absence of lung [part of]: Secondary | ICD-10-CM | POA: Insufficient documentation

## 2012-11-04 DIAGNOSIS — R0602 Shortness of breath: Secondary | ICD-10-CM | POA: Insufficient documentation

## 2012-11-04 DIAGNOSIS — I700 Atherosclerosis of aorta: Secondary | ICD-10-CM | POA: Insufficient documentation

## 2012-11-04 DIAGNOSIS — J8410 Pulmonary fibrosis, unspecified: Secondary | ICD-10-CM | POA: Insufficient documentation

## 2012-11-04 DIAGNOSIS — I2510 Atherosclerotic heart disease of native coronary artery without angina pectoris: Secondary | ICD-10-CM | POA: Insufficient documentation

## 2012-11-04 DIAGNOSIS — C3490 Malignant neoplasm of unspecified part of unspecified bronchus or lung: Secondary | ICD-10-CM | POA: Insufficient documentation

## 2012-11-04 MED ORDER — IOHEXOL 300 MG/ML  SOLN
80.0000 mL | Freq: Once | INTRAMUSCULAR | Status: AC | PRN
  Administered 2012-11-04: 80 mL via INTRAVENOUS

## 2012-11-04 NOTE — ED Provider Notes (Signed)
See prior note   Ward Givens, MD 11/04/12 [phone]

## 2012-11-06 ENCOUNTER — Telehealth: Payer: Self-pay | Admitting: *Deleted

## 2012-11-06 ENCOUNTER — Ambulatory Visit (HOSPITAL_BASED_OUTPATIENT_CLINIC_OR_DEPARTMENT_OTHER): Payer: Medicare Other | Admitting: Internal Medicine

## 2012-11-06 ENCOUNTER — Ambulatory Visit: Payer: Medicare Other | Admitting: Surgery

## 2012-11-06 ENCOUNTER — Telehealth: Payer: Self-pay | Admitting: Internal Medicine

## 2012-11-06 ENCOUNTER — Encounter: Payer: Self-pay | Admitting: Internal Medicine

## 2012-11-06 ENCOUNTER — Other Ambulatory Visit (HOSPITAL_BASED_OUTPATIENT_CLINIC_OR_DEPARTMENT_OTHER): Payer: Medicare Other | Admitting: Lab

## 2012-11-06 DIAGNOSIS — C3490 Malignant neoplasm of unspecified part of unspecified bronchus or lung: Secondary | ICD-10-CM

## 2012-11-06 DIAGNOSIS — C3430 Malignant neoplasm of lower lobe, unspecified bronchus or lung: Secondary | ICD-10-CM

## 2012-11-06 DIAGNOSIS — C3492 Malignant neoplasm of unspecified part of left bronchus or lung: Secondary | ICD-10-CM

## 2012-11-06 LAB — CBC WITH DIFFERENTIAL/PLATELET
BASO%: 0.6 % (ref 0.0–2.0)
Basophils Absolute: 0 10*3/uL (ref 0.0–0.1)
EOS%: 0.3 % (ref 0.0–7.0)
HCT: 27.3 % — ABNORMAL LOW (ref 38.4–49.9)
LYMPH%: 25 % (ref 14.0–49.0)
MCH: 32.3 pg (ref 27.2–33.4)
MCHC: 33.2 g/dL (ref 32.0–36.0)
MCV: 97.4 fL (ref 79.3–98.0)
MONO%: 14.2 % — ABNORMAL HIGH (ref 0.0–14.0)
NEUT%: 59.9 % (ref 39.0–75.0)
Platelets: 114 10*3/uL — ABNORMAL LOW (ref 140–400)

## 2012-11-06 LAB — COMPREHENSIVE METABOLIC PANEL (CC13)
ALT: 16 U/L (ref 0–55)
AST: 25 U/L (ref 5–34)
Albumin: 3.5 g/dL (ref 3.5–5.0)
BUN: 19.8 mg/dL (ref 7.0–26.0)
Calcium: 9.5 mg/dL (ref 8.4–10.4)
Creatinine: 1.3 mg/dL (ref 0.7–1.3)
Potassium: 4.6 mEq/L (ref 3.5–5.1)
Total Bilirubin: 0.74 mg/dL (ref 0.20–1.20)

## 2012-11-06 NOTE — Telephone Encounter (Signed)
Per staff message and POF I have scheduled appts.  JMW  
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
## 2012-11-06 NOTE — Telephone Encounter (Signed)
appts made cal and avs to pt email mw for tx shh

## 2012-11-06 NOTE — Patient Instructions (Signed)
CURRENT THERAPY: Systemic chemotherapy with carboplatin for AUC of 5, paclitaxel 175 mg/M2 and Avastin 15 mg/kg with Neulasta support every 3 weeks. Status post 4 cycles. First cycle was given on 08/13/2012.  CHEMOTHERAPY INTENT: Palliative  CURRENT # OF CHEMOTHERAPY CYCLES: 5  CURRENT ANTIEMETICS: Zofran, dexamethasone.  CURRENT SMOKING STATUS: currently a nonsmoker  ORAL CHEMOTHERAPY AND CONSENT: None  CURRENT BISPHOSPHONATES USE: None  PAIN MANAGEMENT: well-controlled with oxycodone  NARCOTICS INDUCED CONSTIPATION: over the counter stool softener  LIVING WILL AND CODE STATUS: Full code

## 2012-11-07 ENCOUNTER — Ambulatory Visit (HOSPITAL_BASED_OUTPATIENT_CLINIC_OR_DEPARTMENT_OTHER): Payer: Medicare Other

## 2012-11-07 ENCOUNTER — Ambulatory Visit: Payer: Medicare Other

## 2012-11-07 DIAGNOSIS — Z5111 Encounter for antineoplastic chemotherapy: Secondary | ICD-10-CM

## 2012-11-07 DIAGNOSIS — Z5112 Encounter for antineoplastic immunotherapy: Secondary | ICD-10-CM

## 2012-11-07 DIAGNOSIS — C3430 Malignant neoplasm of lower lobe, unspecified bronchus or lung: Secondary | ICD-10-CM

## 2012-11-07 MED ORDER — DEXAMETHASONE SODIUM PHOSPHATE 20 MG/5ML IJ SOLN
INTRAMUSCULAR | Status: AC
  Filled 2012-11-07: qty 5

## 2012-11-07 MED ORDER — ONDANSETRON 16 MG/50ML IVPB (CHCC)
INTRAVENOUS | Status: AC
  Filled 2012-11-07: qty 16

## 2012-11-07 MED ORDER — DEXAMETHASONE SODIUM PHOSPHATE 20 MG/5ML IJ SOLN
20.0000 mg | Freq: Once | INTRAMUSCULAR | Status: AC
  Administered 2012-11-07: 20 mg via INTRAVENOUS

## 2012-11-07 MED ORDER — SODIUM CHLORIDE 0.9 % IJ SOLN
10.0000 mL | INTRAMUSCULAR | Status: DC | PRN
  Administered 2012-11-07: 10 mL
  Filled 2012-11-07: qty 10

## 2012-11-07 MED ORDER — SODIUM CHLORIDE 0.9 % IV SOLN
Freq: Once | INTRAVENOUS | Status: AC
  Administered 2012-11-07: 13:00:00 via INTRAVENOUS

## 2012-11-07 MED ORDER — DIPHENHYDRAMINE HCL 50 MG/ML IJ SOLN
50.0000 mg | Freq: Once | INTRAMUSCULAR | Status: AC
  Administered 2012-11-07: 50 mg via INTRAVENOUS

## 2012-11-07 MED ORDER — ONDANSETRON 16 MG/50ML IVPB (CHCC)
16.0000 mg | Freq: Once | INTRAVENOUS | Status: AC
  Administered 2012-11-07: 16 mg via INTRAVENOUS

## 2012-11-07 MED ORDER — SODIUM CHLORIDE 0.9 % IV SOLN
15.0000 mg/kg | Freq: Once | INTRAVENOUS | Status: AC
  Administered 2012-11-07: 2225 mg via INTRAVENOUS
  Filled 2012-11-07: qty 89

## 2012-11-07 MED ORDER — CARBOPLATIN CHEMO INJECTION 600 MG/60ML
703.5000 mg | Freq: Once | INTRAVENOUS | Status: AC
  Administered 2012-11-07: 700 mg via INTRAVENOUS
  Filled 2012-11-07: qty 70

## 2012-11-07 MED ORDER — HEPARIN SOD (PORK) LOCK FLUSH 100 UNIT/ML IV SOLN
500.0000 [IU] | Freq: Once | INTRAVENOUS | Status: AC | PRN
  Administered 2012-11-07: 500 [IU]
  Filled 2012-11-07: qty 5

## 2012-11-07 MED ORDER — FAMOTIDINE IN NACL 20-0.9 MG/50ML-% IV SOLN
20.0000 mg | Freq: Once | INTRAVENOUS | Status: AC
  Administered 2012-11-07: 20 mg via INTRAVENOUS

## 2012-11-07 MED ORDER — PACLITAXEL CHEMO INJECTION 300 MG/50ML
150.0000 mg/m2 | Freq: Once | INTRAVENOUS | Status: AC
  Administered 2012-11-07: 408 mg via INTRAVENOUS
  Filled 2012-11-07: qty 68

## 2012-11-07 MED ORDER — DIPHENHYDRAMINE HCL 50 MG/ML IJ SOLN
INTRAMUSCULAR | Status: AC
  Filled 2012-11-07: qty 1

## 2012-11-07 MED ORDER — FAMOTIDINE IN NACL 20-0.9 MG/50ML-% IV SOLN
INTRAVENOUS | Status: AC
  Filled 2012-11-07: qty 50

## 2012-11-07 NOTE — Progress Notes (Signed)
Multiple alarms for partial occlusion and occlusion, multiple checks for good blood return, no swelling, no pain at port. This created longer infusion time for the taxol. Eventually found pt position of arm elevated on 2 pillows that improved flow. Left note on RN's desk for Dr Laneta Simmers to be informed tomorrow. Noted suspicious coiling of catheter just superior to port on CT.

## 2012-11-07 NOTE — Patient Instructions (Signed)
[facility] Discharge Instructions for Patients Receiving Chemotherapy  Today you received the following chemotherapy agents avastin, taxol, carboplatin  To help prevent nausea and vomiting after your treatment, we encourage you to take your nausea medication starting about 7 pm if needed   If you develop nausea and vomiting that is not controlled by your nausea medication, call the clinic.   BELOW ARE SYMPTOMS THAT SHOULD BE REPORTED IMMEDIATELY:  *FEVER GREATER THAN 100.5 F  *CHILLS WITH OR WITHOUT FEVER  NAUSEA AND VOMITING THAT IS NOT CONTROLLED WITH YOUR NAUSEA MEDICATION  *UNUSUAL SHORTNESS OF BREATH  *UNUSUAL BRUISING OR BLEEDING  TENDERNESS IN MOUTH AND THROAT WITH OR WITHOUT PRESENCE OF ULCERS  *URINARY PROBLEMS  *BOWEL PROBLEMS  UNUSUAL RASH Items with * indicate a potential emergency and should be followed up as soon as possible.  Feel free to call the clinic you have any questions or concerns. The clinic phone number is [phone].

## 2012-11-08 ENCOUNTER — Ambulatory Visit (HOSPITAL_BASED_OUTPATIENT_CLINIC_OR_DEPARTMENT_OTHER): Payer: Medicare Other

## 2012-11-08 DIAGNOSIS — C3430 Malignant neoplasm of lower lobe, unspecified bronchus or lung: Secondary | ICD-10-CM

## 2012-11-08 MED ORDER — PEGFILGRASTIM INJECTION 6 MG/0.6ML
6.0000 mg | Freq: Once | SUBCUTANEOUS | Status: AC
  Administered 2012-11-08: 6 mg via SUBCUTANEOUS
  Filled 2012-11-08: qty 0.6

## 2012-11-11 ENCOUNTER — Other Ambulatory Visit: Payer: Self-pay | Admitting: Certified Registered Nurse Anesthetist

## 2012-11-11 ENCOUNTER — Telehealth: Payer: Self-pay | Admitting: *Deleted

## 2012-11-11 MED ORDER — ONDANSETRON HCL 8 MG PO TABS: 8.0000 mg | ORAL_TABLET | Freq: Three times a day (TID) | ORAL | Status: DC | PRN

## 2012-11-11 NOTE — Telephone Encounter (Signed)
Pt requesting nausea medication.  rx called into pharmacy.  SLJ

## 2012-11-12 ENCOUNTER — Emergency Department (HOSPITAL_COMMUNITY)
Admission: EM | Admit: 2012-11-12 | Discharge: 2012-11-12 | Disposition: A | Discharge status: Home / Self Care | Payer: Medicare Other | Attending: Emergency Medicine | Admitting: Emergency Medicine

## 2012-11-12 ENCOUNTER — Emergency Department (HOSPITAL_COMMUNITY): Payer: Medicare Other

## 2012-11-12 ENCOUNTER — Encounter (HOSPITAL_COMMUNITY): Payer: Self-pay | Admitting: Emergency Medicine

## 2012-11-12 DIAGNOSIS — I509 Heart failure, unspecified: Secondary | ICD-10-CM | POA: Insufficient documentation

## 2012-11-12 DIAGNOSIS — I10 Essential (primary) hypertension: Secondary | ICD-10-CM | POA: Insufficient documentation

## 2012-11-12 DIAGNOSIS — E785 Hyperlipidemia, unspecified: Secondary | ICD-10-CM | POA: Insufficient documentation

## 2012-11-12 DIAGNOSIS — Z7982 Long term (current) use of aspirin: Secondary | ICD-10-CM | POA: Insufficient documentation

## 2012-11-12 DIAGNOSIS — C3490 Malignant neoplasm of unspecified part of unspecified bronchus or lung: Secondary | ICD-10-CM | POA: Insufficient documentation

## 2012-11-12 DIAGNOSIS — R011 Cardiac murmur, unspecified: Secondary | ICD-10-CM | POA: Insufficient documentation

## 2012-11-12 DIAGNOSIS — R600 Localized edema: Secondary | ICD-10-CM

## 2012-11-12 DIAGNOSIS — I872 Venous insufficiency (chronic) (peripheral): Secondary | ICD-10-CM | POA: Insufficient documentation

## 2012-11-12 DIAGNOSIS — G4700 Insomnia, unspecified: Secondary | ICD-10-CM | POA: Insufficient documentation

## 2012-11-12 DIAGNOSIS — I8310 Varicose veins of unspecified lower extremity with inflammation: Secondary | ICD-10-CM | POA: Insufficient documentation

## 2012-11-12 DIAGNOSIS — M79609 Pain in unspecified limb: Secondary | ICD-10-CM | POA: Insufficient documentation

## 2012-11-12 DIAGNOSIS — Z87442 Personal history of urinary calculi: Secondary | ICD-10-CM | POA: Insufficient documentation

## 2012-11-12 DIAGNOSIS — Z87891 Personal history of nicotine dependence: Secondary | ICD-10-CM | POA: Insufficient documentation

## 2012-11-12 DIAGNOSIS — M129 Arthropathy, unspecified: Secondary | ICD-10-CM | POA: Insufficient documentation

## 2012-11-12 DIAGNOSIS — R609 Edema, unspecified: Secondary | ICD-10-CM | POA: Insufficient documentation

## 2012-11-12 DIAGNOSIS — Z79899 Other long term (current) drug therapy: Secondary | ICD-10-CM | POA: Insufficient documentation

## 2012-11-12 LAB — CBC
HCT: 27.8 % — ABNORMAL LOW (ref 39.0–52.0)
Hemoglobin: 9.5 g/dL — ABNORMAL LOW (ref 13.0–17.0)
MCH: 32.9 pg (ref 26.0–34.0)
MCHC: 34.2 g/dL (ref 30.0–36.0)
Platelets: 64 10*3/uL — ABNORMAL LOW (ref 150–400)
RBC: 2.89 MIL/uL — ABNORMAL LOW (ref 4.22–5.81)
RDW: 19 % — ABNORMAL HIGH (ref 11.5–15.5)
WBC: 4.8 10*3/uL (ref 4.0–10.5)

## 2012-11-12 LAB — BASIC METABOLIC PANEL
BUN: 21 mg/dL (ref 6–23)
CO2: 26 mEq/L (ref 19–32)
GFR calc non Af Amer: 53 mL/min — ABNORMAL LOW (ref >90)
Glucose, Bld: 105 mg/dL — ABNORMAL HIGH (ref 70–99)
Potassium: 4.5 mEq/L (ref 3.5–5.1)
Sodium: 133 mEq/L — ABNORMAL LOW (ref 135–145)

## 2012-11-12 LAB — PRO B NATRIURETIC PEPTIDE: Pro B Natriuretic peptide (BNP): 73 pg/mL (ref 0–125)

## 2012-11-12 MED ORDER — HYDROMORPHONE HCL PF 1 MG/ML IJ SOLN
1.0000 mg | Freq: Once | INTRAMUSCULAR | Status: AC
  Administered 2012-11-12: 1 mg via INTRAMUSCULAR

## 2012-11-12 MED ORDER — HYDROCODONE-ACETAMINOPHEN 10-325 MG PO TABS: 1.0000 | ORAL_TABLET | Freq: Three times a day (TID) | ORAL | Status: DC | PRN

## 2012-11-12 MED ORDER — HYDROMORPHONE HCL PF 1 MG/ML IJ SOLN
1.0000 mg | Freq: Once | INTRAMUSCULAR | Status: AC
  Administered 2012-11-12: 1 mg via INTRAMUSCULAR
  Filled 2012-11-12: qty 1

## 2012-11-12 MED ORDER — HYDROMORPHONE HCL PF 1 MG/ML IJ SOLN
1.0000 mg | Freq: Once | INTRAMUSCULAR | Status: DC
  Filled 2012-11-12: qty 1
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
## 2012-11-12 NOTE — ED Notes (Addendum)
ED MD aware of pain unchanged after meds

## 2012-11-12 NOTE — ED Notes (Signed)
Pt c/o increased SOB and bilateral leg swelling with left worse than right x 2 days; pt sts hx of current lung CA and has portacath

## 2012-11-12 NOTE — ED Notes (Signed)
Troponin results shown to Dr. Jeraldine Loots

## 2012-11-12 NOTE — ED Notes (Signed)
C/o BLE pain, L>R x 2 months. Seen at [facility] ED last week for same. Had negative doppler study. Was not given any pain meds. States OTC meds not helping.

## 2012-11-13 ENCOUNTER — Other Ambulatory Visit (HOSPITAL_BASED_OUTPATIENT_CLINIC_OR_DEPARTMENT_OTHER): Payer: Medicare Other

## 2012-11-13 DIAGNOSIS — C3492 Malignant neoplasm of unspecified part of left bronchus or lung: Secondary | ICD-10-CM

## 2012-11-13 DIAGNOSIS — C3490 Malignant neoplasm of unspecified part of unspecified bronchus or lung: Secondary | ICD-10-CM

## 2012-11-13 LAB — COMPREHENSIVE METABOLIC PANEL (CC13)
ALT: 27 U/L (ref 0–55)
Alkaline Phosphatase: 87 U/L (ref 40–150)
Anion Gap: 12 mEq/L — ABNORMAL HIGH (ref 3–11)
BUN: 24.8 mg/dL (ref 7.0–26.0)
CO2: 24 mEq/L (ref 22–29)
Creatinine: 1.5 mg/dL — ABNORMAL HIGH (ref 0.7–1.3)
Sodium: 134 mEq/L — ABNORMAL LOW (ref 136–145)
Total Bilirubin: 0.7 mg/dL (ref 0.20–1.20)
Total Protein: 7.7 g/dL (ref 6.4–8.3)

## 2012-11-13 LAB — CBC WITH DIFFERENTIAL/PLATELET
BASO%: 0.8 % (ref 0.0–2.0)
Basophils Absolute: 0 10*3/uL (ref 0.0–0.1)
EOS%: 1.3 % (ref 0.0–7.0)
HCT: 26.8 % — ABNORMAL LOW (ref 38.4–49.9)
LYMPH%: 22.6 % (ref 14.0–49.0)
MCH: 32.3 pg (ref 27.2–33.4)
MCHC: 33.3 g/dL (ref 32.0–36.0)
MCV: 97.2 fL (ref 79.3–98.0)
MONO%: 3.1 % (ref 0.0–14.0)
NEUT%: 72.2 % (ref 39.0–75.0)
Platelets: 52 10*3/uL — ABNORMAL LOW (ref 140–400)
RBC: 2.76 10*6/uL — ABNORMAL LOW (ref 4.20–5.82)

## 2012-11-15 ENCOUNTER — Telehealth: Payer: Self-pay | Admitting: Medical Oncology

## 2012-11-15 DIAGNOSIS — R630 Anorexia: Secondary | ICD-10-CM

## 2012-11-15 MED ORDER — METHYLPREDNISOLONE (PAK) 4 MG PO TABS: ORAL_TABLET | ORAL | Status: DC

## 2012-11-15 NOTE — Telephone Encounter (Signed)
Pt requests appetite stimulant. No appetite in past 3 days . He ate a hamburger, water , soup and ensure. He denies nausea , last BM this am -small amount. I instructed him to get OTC stool softener and take as directed per package instructions. Per Dr Arbutus Ped I ordered medrol dose pak for pt and pt notified.

## 2012-11-18 ENCOUNTER — Ambulatory Visit (INDEPENDENT_AMBULATORY_CARE_PROVIDER_SITE_OTHER): Payer: Self-pay | Admitting: Physician Assistant

## 2012-11-18 VITALS — BP 113/67 | HR 90 | Resp 20 | Ht 69.0 in | Wt 304.0 lb

## 2012-11-18 DIAGNOSIS — C3490 Malignant neoplasm of unspecified part of unspecified bronchus or lung: Secondary | ICD-10-CM

## 2012-11-18 DIAGNOSIS — Z9889 Other specified postprocedural states: Secondary | ICD-10-CM

## 2012-11-18 DIAGNOSIS — Z95828 Presence of other vascular implants and grafts: Secondary | ICD-10-CM

## 2012-11-18 NOTE — Progress Notes (Signed)
       [address]       [name] [address]             [phone]          HPI: Patient returns for a wound check.  He is status post removal of a right subclavian port-a-cath and placement of a left subclavian port-a-cath by Dr. Laneta Simmers on 09/27/2012. His right sided wound superficially separated, and he has been treating it with local wound care and dressing changes. It has completely closed up per the patient and is no longer requiring a dressing.  He denies drainage, fevers or chills.  The left sided port-a-cath has been used without difficulty, although during his last chemotherapy, there were multiple alarms for occlusion.They were able to reposition his arm with resolution of the alarms.   A chest x-ray was performed, and the patient states he was told everything looked fine.  He has 1 remaining chemo session scheduled.      Current Outpatient Prescriptions  Medication Sig Dispense Refill  . aspirin EC 81 MG tablet Take 81 mg by mouth daily.      Marland Kitchen atorvastatin (LIPITOR) 40 MG tablet Take 40 mg by mouth daily.      . carvedilol (COREG) 3.125 MG tablet Take 3.125 mg by mouth 2 (two) times daily with a meal.       . digoxin (LANOXIN) 0.125 MG tablet Take 0.5 tablets (0.0625 mg total) by mouth daily.  30 tablet  3  . furosemide (LASIX) 40 MG tablet Take 80 mg by mouth daily.      Marland Kitchen HYDROcodone-acetaminophen (NORCO) 10-325 MG per tablet Take 1 tablet by mouth every 8 (eight) hours as needed for pain.  20 tablet  0  . lidocaine-prilocaine (EMLA) cream Apply 1 application topically as needed (for port-a-cath access).       . methylPREDNIsolone (MEDROL DOSPACK) 4 MG tablet follow package directions  21 tablet  0  . ondansetron (ZOFRAN) 8 MG tablet Take 1 tablet (8 mg total) by mouth every 8 (eight) hours as needed for nausea.  30 tablet  1  . potassium chloride SA (K-DUR,KLOR-CON) 20 MEQ tablet Take 1 tablet (20 mEq total) by mouth 2 (two) times daily.  60 tablet  3  .  PRESCRIPTION MEDICATION Inject into the vein every 21 ( twenty-one) days. Taxol, Avastin and Paraplatin infusion q3weeks.      . ramipril (ALTACE) 2.5 MG capsule Take 1 capsule (2.5 mg total) by mouth daily.  30 capsule  3   No current facility-administered medications for this visit.   Facility-Administered Medications Ordered in Other Visits  Medication Dose Route Frequency Provider Last Rate Last Dose  . sodium chloride 0.9 % injection 10 mL  10 mL Intracatheter PRN Si Gaul, MD   10 mL at 09/03/12 1534     Physical Exam: BP 113/67 HR 90 Resp 20 Wounds: Both port-a-cath sites are well healed at this point, with no separation, erythema, or drainage.    Diagnostic Tests: Chest xray: No results found.     Assessment/Plan: His port-a-cath sites have healed well and the left side is functioning appropriately.  We will see him back as needed.

## 2012-11-20 ENCOUNTER — Ambulatory Visit: Payer: Medicare Other | Admitting: Surgery

## 2012-11-20 ENCOUNTER — Other Ambulatory Visit (HOSPITAL_BASED_OUTPATIENT_CLINIC_OR_DEPARTMENT_OTHER): Payer: Medicare Other

## 2012-11-20 DIAGNOSIS — C3492 Malignant neoplasm of unspecified part of left bronchus or lung: Secondary | ICD-10-CM

## 2012-11-20 DIAGNOSIS — C3430 Malignant neoplasm of lower lobe, unspecified bronchus or lung: Secondary | ICD-10-CM

## 2012-11-20 DIAGNOSIS — I10 Essential (primary) hypertension: Secondary | ICD-10-CM

## 2012-11-20 LAB — UA PROTEIN, DIPSTICK - CHCC: Protein, ur: NEGATIVE mg/dL

## 2012-11-20 LAB — CBC WITH DIFFERENTIAL/PLATELET
Basophils Absolute: 0 10*3/uL (ref 0.0–0.1)
EOS%: 0 % (ref 0.0–7.0)
Eosinophils Absolute: 0 10*3/uL (ref 0.0–0.5)
HCT: 28 % — ABNORMAL LOW (ref 38.4–49.9)
HGB: 8.9 g/dL — ABNORMAL LOW (ref 13.0–17.1)
LYMPH%: 14.3 % (ref 14.0–49.0)
MCH: 31.7 pg (ref 27.2–33.4)
MONO#: 0.6 10*3/uL (ref 0.1–0.9)
NEUT%: 78.3 % — ABNORMAL HIGH (ref 39.0–75.0)
Platelets: 46 10*3/uL — ABNORMAL LOW (ref 140–400)
lymph#: 1.2 10*3/uL (ref 0.9–3.3)

## 2012-11-20 LAB — COMPREHENSIVE METABOLIC PANEL (CC13)
Albumin: 3.4 g/dL — ABNORMAL LOW (ref 3.5–5.0)
Anion Gap: 12 mEq/L — ABNORMAL HIGH (ref 3–11)
BUN: 20.8 mg/dL (ref 7.0–26.0)
CO2: 24 mEq/L (ref 22–29)
Calcium: 9 mg/dL (ref 8.4–10.4)
Chloride: 102 mEq/L (ref 98–109)
Glucose: 117 mg/dl (ref 70–140)
Potassium: 4.4 mEq/L (ref 3.5–5.1)
Sodium: 137 mEq/L (ref 136–145)
Total Bilirubin: 0.7 mg/dL (ref 0.20–1.20)
Total Protein: 8.1 g/dL (ref 6.4–8.3)

## 2012-11-27 ENCOUNTER — Ambulatory Visit: Payer: Medicare Other | Admitting: Physician Assistant

## 2012-11-27 ENCOUNTER — Other Ambulatory Visit: Payer: Medicare Other | Admitting: Lab

## 2012-11-28 ENCOUNTER — Ambulatory Visit (HOSPITAL_BASED_OUTPATIENT_CLINIC_OR_DEPARTMENT_OTHER): Payer: Medicare Other | Admitting: Physician Assistant

## 2012-11-28 ENCOUNTER — Ambulatory Visit: Payer: Medicare Other

## 2012-11-28 ENCOUNTER — Telehealth: Payer: Self-pay | Admitting: Internal Medicine

## 2012-11-28 ENCOUNTER — Encounter: Payer: Self-pay | Admitting: Physician Assistant

## 2012-11-28 ENCOUNTER — Telehealth: Payer: Self-pay | Admitting: *Deleted

## 2012-11-28 ENCOUNTER — Other Ambulatory Visit: Payer: Self-pay | Admitting: *Deleted

## 2012-11-28 ENCOUNTER — Other Ambulatory Visit (HOSPITAL_BASED_OUTPATIENT_CLINIC_OR_DEPARTMENT_OTHER): Payer: Medicare Other | Admitting: Lab

## 2012-11-28 ENCOUNTER — Telehealth: Payer: Self-pay | Admitting: Medical Oncology

## 2012-11-28 DIAGNOSIS — M7989 Other specified soft tissue disorders: Secondary | ICD-10-CM

## 2012-11-28 DIAGNOSIS — G609 Hereditary and idiopathic neuropathy, unspecified: Secondary | ICD-10-CM

## 2012-11-28 DIAGNOSIS — C3430 Malignant neoplasm of lower lobe, unspecified bronchus or lung: Secondary | ICD-10-CM

## 2012-11-28 DIAGNOSIS — R0609 Other forms of dyspnea: Secondary | ICD-10-CM

## 2012-11-28 DIAGNOSIS — C3492 Malignant neoplasm of unspecified part of left bronchus or lung: Secondary | ICD-10-CM

## 2012-11-28 LAB — CBC WITH DIFFERENTIAL/PLATELET
BASO%: 0.5 % (ref 0.0–2.0)
MCHC: 31.5 g/dL — ABNORMAL LOW (ref 32.0–36.0)
MONO#: 0.6 10*3/uL (ref 0.1–0.9)
RBC: 2.72 10*6/uL — ABNORMAL LOW (ref 4.20–5.82)
RDW: 20.2 % — ABNORMAL HIGH (ref 11.0–14.6)
WBC: 4 10*3/uL (ref 4.0–10.3)
lymph#: 1.4 10*3/uL (ref 0.9–3.3)
nRBC: 0 % (ref 0–0)

## 2012-11-28 MED ORDER — GABAPENTIN 100 MG PO CAPS: 100.0000 mg | ORAL_CAPSULE | Freq: Three times a day (TID) | ORAL | Status: DC

## 2012-11-28 NOTE — Telephone Encounter (Signed)
I notified

## 2012-11-28 NOTE — Telephone Encounter (Signed)
gv and pritned appt sched and avs for pt for NOV..MW adjust tx.

## 2012-11-28 NOTE — Telephone Encounter (Signed)
gv and printed avs and appt sched...MW added tx.

## 2012-11-28 NOTE — Telephone Encounter (Signed)
Per staff phone call and POF I have schedueld appts.  JMW  

## 2012-11-29 ENCOUNTER — Ambulatory Visit: Payer: Medicare Other

## 2012-12-02 NOTE — Patient Instructions (Signed)
Your platelet count is too low to receive his chemotherapy today as scheduled. Return in one week for repeat counts to proceed with chemotherapy as long as her counts are within therapeutic range Follow up with Dr. Arbutus Ped in 4 weeks with restaging CT scan of your chest with contrast to reevaluate her disease For your peripheral neuropathy symptoms you will take gabapentin 200 mg by mouth 3 times daily
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
## 2012-12-05 ENCOUNTER — Other Ambulatory Visit (HOSPITAL_BASED_OUTPATIENT_CLINIC_OR_DEPARTMENT_OTHER): Payer: Medicare Other | Admitting: Lab

## 2012-12-05 ENCOUNTER — Other Ambulatory Visit: Payer: Self-pay | Admitting: *Deleted

## 2012-12-05 ENCOUNTER — Other Ambulatory Visit: Payer: Self-pay | Admitting: Internal Medicine

## 2012-12-05 ENCOUNTER — Ambulatory Visit (HOSPITAL_BASED_OUTPATIENT_CLINIC_OR_DEPARTMENT_OTHER): Payer: Medicare Other

## 2012-12-05 DIAGNOSIS — C3430 Malignant neoplasm of lower lobe, unspecified bronchus or lung: Secondary | ICD-10-CM

## 2012-12-05 DIAGNOSIS — Z5112 Encounter for antineoplastic immunotherapy: Secondary | ICD-10-CM

## 2012-12-05 DIAGNOSIS — Z23 Encounter for immunization: Secondary | ICD-10-CM

## 2012-12-05 DIAGNOSIS — R630 Anorexia: Secondary | ICD-10-CM

## 2012-12-05 DIAGNOSIS — C3492 Malignant neoplasm of unspecified part of left bronchus or lung: Secondary | ICD-10-CM

## 2012-12-05 DIAGNOSIS — Z5111 Encounter for antineoplastic chemotherapy: Secondary | ICD-10-CM

## 2012-12-05 LAB — COMPREHENSIVE METABOLIC PANEL (CC13)
AST: 23 U/L (ref 5–34)
Albumin: 3.7 g/dL (ref 3.5–5.0)
Alkaline Phosphatase: 81 U/L (ref 40–150)
Creatinine: 1.5 mg/dL — ABNORMAL HIGH (ref 0.7–1.3)
Glucose: 118 mg/dl (ref 70–140)
Potassium: 4.6 mEq/L (ref 3.5–5.1)
Sodium: 140 mEq/L (ref 136–145)
Total Bilirubin: 0.7 mg/dL (ref 0.20–1.20)
Total Protein: 8.5 g/dL — ABNORMAL HIGH (ref 6.4–8.3)

## 2012-12-05 LAB — CBC WITH DIFFERENTIAL/PLATELET
Basophils Absolute: 0 10*3/uL (ref 0.0–0.1)
EOS%: 1 % (ref 0.0–7.0)
HGB: 9.5 g/dL — ABNORMAL LOW (ref 13.0–17.1)
LYMPH%: 36.6 % (ref 14.0–49.0)
MCH: 32.4 pg (ref 27.2–33.4)
NEUT#: 2 10*3/uL (ref 1.5–6.5)
NEUT%: 51.5 % (ref 39.0–75.0)
Platelets: 122 10*3/uL — ABNORMAL LOW (ref 140–400)
RDW: 20.2 % — ABNORMAL HIGH (ref 11.0–14.6)
WBC: 3.9 10*3/uL — ABNORMAL LOW (ref 4.0–10.3)
lymph#: 1.4 10*3/uL (ref 0.9–3.3)

## 2012-12-05 MED ORDER — INFLUENZA VAC SPLIT QUAD 0.5 ML IM SUSP
0.5000 mL | INTRAMUSCULAR | Status: AC
  Administered 2012-12-05: 0.5 mL via INTRAMUSCULAR
  Filled 2012-12-05: qty 0.5

## 2012-12-05 MED ORDER — ONDANSETRON 16 MG/50ML IVPB (CHCC)
INTRAVENOUS | Status: AC
  Filled 2012-12-05: qty 16

## 2012-12-05 MED ORDER — PACLITAXEL CHEMO INJECTION 300 MG/50ML
150.0000 mg/m2 | Freq: Once | INTRAVENOUS | Status: AC
  Administered 2012-12-05: 408 mg via INTRAVENOUS
  Filled 2012-12-05: qty 68

## 2012-12-05 MED ORDER — SODIUM CHLORIDE 0.9 % IV SOLN
15.0000 mg/kg | Freq: Once | INTRAVENOUS | Status: AC
  Administered 2012-12-05: 2225 mg via INTRAVENOUS
  Filled 2012-12-05: qty 89

## 2012-12-05 MED ORDER — SODIUM CHLORIDE 0.9 % IJ SOLN
10.0000 mL | INTRAMUSCULAR | Status: DC | PRN
  Administered 2012-12-05: 10 mL
  Filled 2012-12-05: qty 10

## 2012-12-05 MED ORDER — FAMOTIDINE IN NACL 20-0.9 MG/50ML-% IV SOLN
INTRAVENOUS | Status: AC
  Filled 2012-12-05: qty 50

## 2012-12-05 MED ORDER — DEXAMETHASONE SODIUM PHOSPHATE 20 MG/5ML IJ SOLN
INTRAMUSCULAR | Status: AC
  Filled 2012-12-05: qty 5

## 2012-12-05 MED ORDER — HEPARIN SOD (PORK) LOCK FLUSH 100 UNIT/ML IV SOLN
500.0000 [IU] | Freq: Once | INTRAVENOUS | Status: AC | PRN
  Administered 2012-12-05: 500 [IU]
  Filled 2012-12-05: qty 5

## 2012-12-05 MED ORDER — METHYLPREDNISOLONE (PAK) 4 MG PO TABS: ORAL_TABLET | ORAL | Status: DC

## 2012-12-05 MED ORDER — DEXAMETHASONE SODIUM PHOSPHATE 20 MG/5ML IJ SOLN
20.0000 mg | Freq: Once | INTRAMUSCULAR | Status: AC
  Administered 2012-12-05: 20 mg via INTRAVENOUS

## 2012-12-05 MED ORDER — DIPHENHYDRAMINE HCL 50 MG/ML IJ SOLN
INTRAMUSCULAR | Status: AC
  Filled 2012-12-05: qty 1

## 2012-12-05 MED ORDER — DIPHENHYDRAMINE HCL 50 MG/ML IJ SOLN
50.0000 mg | Freq: Once | INTRAMUSCULAR | Status: AC
  Administered 2012-12-05: 50 mg via INTRAVENOUS

## 2012-12-05 MED ORDER — SODIUM CHLORIDE 0.9 % IV SOLN
Freq: Once | INTRAVENOUS | Status: AC
  Administered 2012-12-05: 09:00:00 via INTRAVENOUS

## 2012-12-05 MED ORDER — ONDANSETRON 16 MG/50ML IVPB (CHCC)
16.0000 mg | Freq: Once | INTRAVENOUS | Status: AC
  Administered 2012-12-05: 16 mg via INTRAVENOUS

## 2012-12-05 MED ORDER — SODIUM CHLORIDE 0.9 % IV SOLN
703.5000 mg | Freq: Once | INTRAVENOUS | Status: AC
  Administered 2012-12-05: 700 mg via INTRAVENOUS
  Filled 2012-12-05: qty 70

## 2012-12-05 MED ORDER — FAMOTIDINE IN NACL 20-0.9 MG/50ML-% IV SOLN
20.0000 mg | Freq: Once | INTRAVENOUS | Status: AC
  Administered 2012-12-05: 20 mg via INTRAVENOUS

## 2012-12-05 NOTE — Telephone Encounter (Signed)
Pt requested refill on medrol dose pack, per Dr Donnald Garre, okay to refill.  SLJ

## 2012-12-05 NOTE — Patient Instructions (Signed)
[facility] Discharge Instructions for Patients Receiving Chemotherapy  Today you received the following chemotherapy agents Carboplatin,taxol To help prevent nausea and vomiting after your treatment, we encourage you to take your nausea medication  If you develop nausea and vomiting that is not controlled by your nausea medication, call the clinic.   BELOW ARE SYMPTOMS THAT SHOULD BE REPORTED IMMEDIATELY:  *FEVER GREATER THAN 100.5 F  *CHILLS WITH OR WITHOUT FEVER  NAUSEA AND VOMITING THAT IS NOT CONTROLLED WITH YOUR NAUSEA MEDICATION  *UNUSUAL SHORTNESS OF BREATH  *UNUSUAL BRUISING OR BLEEDING  TENDERNESS IN MOUTH AND THROAT WITH OR WITHOUT PRESENCE OF ULCERS  *URINARY PROBLEMS  *BOWEL PROBLEMS  UNUSUAL RASH Items with * indicate a potential emergency and should be followed up as soon as possible.  Feel free to call the clinic you have any questions or concerns. The clinic phone number is [phone].

## 2012-12-05 NOTE — Progress Notes (Signed)
Chaplain made initial visit. Patient was watching television. Chaplain explained her role. Patient said that he was doing fine. He did not seem to have further needs at this time.

## 2012-12-06 ENCOUNTER — Ambulatory Visit (HOSPITAL_BASED_OUTPATIENT_CLINIC_OR_DEPARTMENT_OTHER): Payer: Medicare Other

## 2012-12-06 DIAGNOSIS — C3430 Malignant neoplasm of lower lobe, unspecified bronchus or lung: Secondary | ICD-10-CM

## 2012-12-06 MED ORDER — PEGFILGRASTIM INJECTION 6 MG/0.6ML
6.0000 mg | Freq: Once | SUBCUTANEOUS | Status: AC
  Administered 2012-12-06: 6 mg via SUBCUTANEOUS
  Filled 2012-12-06: qty 0.6

## 2012-12-12 ENCOUNTER — Other Ambulatory Visit: Payer: Medicare Other | Admitting: Lab

## 2012-12-12 ENCOUNTER — Ambulatory Visit: Payer: Medicare Other | Admitting: Internal Medicine

## 2012-12-13 ENCOUNTER — Telehealth: Payer: Self-pay | Admitting: *Deleted

## 2012-12-13 NOTE — Telephone Encounter (Signed)
Pt called wanting another medrol dose pack to stimulate appetite.  Per dr Donnald Garre, will not give another medrol dosepack at this time, but when he returns to the office on 11/25 Dr Donnald Garre will discuss other options for him.  Gave pt nutritional tips regarding eating small frequent meals, protein shakes, nutritional drink supplements.  Also asked if he was interested in seeing the nutritionist.  Pt declined.  Asked him to keep [place] posted on how he is doing.  Pt also stated he intermittently feels a fullness in his head that occurs during the day but it goes away when he lies down at night.  No dizziness, blurry vision, trouble balancing.  Per dr Donnald Garre, will continue to monitor at f/u at next f/u appt.  SLJ

## 2012-12-21 ENCOUNTER — Encounter (HOSPITAL_COMMUNITY): Payer: Self-pay | Admitting: Emergency Medicine

## 2012-12-21 ENCOUNTER — Emergency Department (HOSPITAL_COMMUNITY)
Admission: EM | Admit: 2012-12-21 | Discharge: 2012-12-21 | Disposition: A | Discharge status: Home / Self Care | Payer: Medicare Other | Attending: Emergency Medicine | Admitting: Emergency Medicine

## 2012-12-21 ENCOUNTER — Emergency Department (HOSPITAL_COMMUNITY): Payer: Medicare Other

## 2012-12-21 DIAGNOSIS — Z87891 Personal history of nicotine dependence: Secondary | ICD-10-CM | POA: Insufficient documentation

## 2012-12-21 DIAGNOSIS — R040 Epistaxis: Secondary | ICD-10-CM | POA: Insufficient documentation

## 2012-12-21 DIAGNOSIS — M129 Arthropathy, unspecified: Secondary | ICD-10-CM | POA: Insufficient documentation

## 2012-12-21 DIAGNOSIS — Z7982 Long term (current) use of aspirin: Secondary | ICD-10-CM | POA: Insufficient documentation

## 2012-12-21 DIAGNOSIS — Z85118 Personal history of other malignant neoplasm of bronchus and lung: Secondary | ICD-10-CM | POA: Insufficient documentation

## 2012-12-21 DIAGNOSIS — Z79899 Other long term (current) drug therapy: Secondary | ICD-10-CM | POA: Insufficient documentation

## 2012-12-21 DIAGNOSIS — E785 Hyperlipidemia, unspecified: Secondary | ICD-10-CM | POA: Insufficient documentation

## 2012-12-21 DIAGNOSIS — J189 Pneumonia, unspecified organism: Secondary | ICD-10-CM

## 2012-12-21 DIAGNOSIS — Z87442 Personal history of urinary calculi: Secondary | ICD-10-CM | POA: Insufficient documentation

## 2012-12-21 DIAGNOSIS — E876 Hypokalemia: Secondary | ICD-10-CM | POA: Insufficient documentation

## 2012-12-21 DIAGNOSIS — G4700 Insomnia, unspecified: Secondary | ICD-10-CM | POA: Insufficient documentation

## 2012-12-21 DIAGNOSIS — Z87828 Personal history of other (healed) physical injury and trauma: Secondary | ICD-10-CM | POA: Insufficient documentation

## 2012-12-21 DIAGNOSIS — R011 Cardiac murmur, unspecified: Secondary | ICD-10-CM | POA: Insufficient documentation

## 2012-12-21 DIAGNOSIS — R609 Edema, unspecified: Secondary | ICD-10-CM | POA: Insufficient documentation

## 2012-12-21 DIAGNOSIS — I10 Essential (primary) hypertension: Secondary | ICD-10-CM | POA: Insufficient documentation

## 2012-12-21 DIAGNOSIS — J159 Unspecified bacterial pneumonia: Secondary | ICD-10-CM | POA: Insufficient documentation

## 2012-12-21 LAB — PRO B NATRIURETIC PEPTIDE: Pro B Natriuretic peptide (BNP): 83 pg/mL (ref 0–125)

## 2012-12-21 LAB — BASIC METABOLIC PANEL
CO2: 27 mEq/L (ref 19–32)
Chloride: 98 mEq/L (ref 96–112)
Creatinine, Ser: 1.34 mg/dL (ref 0.50–1.35)
GFR calc Af Amer: 62 mL/min — ABNORMAL LOW (ref >90)
Potassium: 4.4 mEq/L (ref 3.5–5.1)
Sodium: 137 mEq/L (ref 135–145)

## 2012-12-21 LAB — CBC
MCV: 103.1 fL — ABNORMAL HIGH (ref 78.0–100.0)
Platelets: 45 10*3/uL — ABNORMAL LOW (ref 150–400)
RBC: 2.61 MIL/uL — ABNORMAL LOW (ref 4.22–5.81)
WBC: 4.2 10*3/uL (ref 4.0–10.5)

## 2012-12-21 MED ORDER — LEVOFLOXACIN 750 MG PO TABS: 750.0000 mg | ORAL_TABLET | Freq: Every day | ORAL | Status: DC
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
## 2012-12-21 NOTE — ED Notes (Signed)
Pt states intermittent nosebleed today, leg swelling and SOB. SOB exacerbated with exertion, and leg swelling exacerbated by lying down. Nosebleed appears to have resolved, but the pt stated clots came out.

## 2012-12-21 NOTE — ED Notes (Signed)
Dr. Bednar at bedside. 

## 2012-12-21 NOTE — ED Notes (Signed)
Patient explains that he has had pre-existing SOB and Leg swelling which has been persistent for the past year. Currently being treated for Lung Cancer, and had part of left lung removed, Shortness of breath has been present since resection approx 1 years ago. Leg swelling has also been present for approximately 1 year.   Primary reason for presentation this evening is persistent nose bleed for approximately 1 week. Currently nose bleed is controlled, but patient explains that if he blows his nose, it starts bleeding again.

## 2012-12-22 ENCOUNTER — Emergency Department (HOSPITAL_COMMUNITY)
Admission: EM | Admit: 2012-12-22 | Discharge: 2012-12-22 | Disposition: A | Discharge status: Home / Self Care | Payer: Medicare Other | Attending: Emergency Medicine | Admitting: Emergency Medicine

## 2012-12-22 DIAGNOSIS — Z87891 Personal history of nicotine dependence: Secondary | ICD-10-CM | POA: Insufficient documentation

## 2012-12-22 DIAGNOSIS — G4700 Insomnia, unspecified: Secondary | ICD-10-CM | POA: Insufficient documentation

## 2012-12-22 DIAGNOSIS — Z85118 Personal history of other malignant neoplasm of bronchus and lung: Secondary | ICD-10-CM | POA: Insufficient documentation

## 2012-12-22 DIAGNOSIS — Z862 Personal history of diseases of the blood and blood-forming organs and certain disorders involving the immune mechanism: Secondary | ICD-10-CM | POA: Insufficient documentation

## 2012-12-22 DIAGNOSIS — Z872 Personal history of diseases of the skin and subcutaneous tissue: Secondary | ICD-10-CM | POA: Insufficient documentation

## 2012-12-22 DIAGNOSIS — E785 Hyperlipidemia, unspecified: Secondary | ICD-10-CM | POA: Insufficient documentation

## 2012-12-22 DIAGNOSIS — R011 Cardiac murmur, unspecified: Secondary | ICD-10-CM | POA: Insufficient documentation

## 2012-12-22 DIAGNOSIS — I10 Essential (primary) hypertension: Secondary | ICD-10-CM | POA: Insufficient documentation

## 2012-12-22 DIAGNOSIS — Z87828 Personal history of other (healed) physical injury and trauma: Secondary | ICD-10-CM | POA: Insufficient documentation

## 2012-12-22 DIAGNOSIS — Z87442 Personal history of urinary calculi: Secondary | ICD-10-CM | POA: Insufficient documentation

## 2012-12-22 DIAGNOSIS — R040 Epistaxis: Secondary | ICD-10-CM

## 2012-12-22 DIAGNOSIS — Z7982 Long term (current) use of aspirin: Secondary | ICD-10-CM | POA: Insufficient documentation

## 2012-12-22 DIAGNOSIS — M129 Arthropathy, unspecified: Secondary | ICD-10-CM | POA: Insufficient documentation

## 2012-12-22 DIAGNOSIS — Z8639 Personal history of other endocrine, nutritional and metabolic disease: Secondary | ICD-10-CM | POA: Insufficient documentation

## 2012-12-22 DIAGNOSIS — Z79899 Other long term (current) drug therapy: Secondary | ICD-10-CM | POA: Insufficient documentation

## 2012-12-22 NOTE — ED Notes (Signed)
Was seen here earlier tonight for nosebleed-- states it is still bleeding-- no bleeding obvious at triage.
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
## 2012-12-22 NOTE — ED Notes (Signed)
Patient explains that he was seen here earlier. This RN completed the initial assessment of patient earlier when MSE was completed. Patient states that he returned home and laid down to sleep and began feeling like his nose was bleeding once again. States that drainage is currently clear, feels like nose is draining down back of throat, and when he clears his throat after is able to expel secretions. Secretions appear clear at this time.

## 2012-12-23 ENCOUNTER — Ambulatory Visit (HOSPITAL_COMMUNITY)
Admission: RE | Admit: 2012-12-23 | Discharge: 2012-12-23 | Disposition: A | Discharge status: Home / Self Care | Payer: Medicare Other | Source: Ambulatory Visit | Attending: Physician Assistant | Admitting: Physician Assistant

## 2012-12-23 ENCOUNTER — Other Ambulatory Visit: Payer: Medicare Other | Admitting: Lab

## 2012-12-23 ENCOUNTER — Other Ambulatory Visit (HOSPITAL_BASED_OUTPATIENT_CLINIC_OR_DEPARTMENT_OTHER): Payer: Medicare Other | Admitting: Lab

## 2012-12-23 DIAGNOSIS — J8410 Pulmonary fibrosis, unspecified: Secondary | ICD-10-CM | POA: Insufficient documentation

## 2012-12-23 DIAGNOSIS — C3492 Malignant neoplasm of unspecified part of left bronchus or lung: Secondary | ICD-10-CM

## 2012-12-23 DIAGNOSIS — Z9221 Personal history of antineoplastic chemotherapy: Secondary | ICD-10-CM | POA: Insufficient documentation

## 2012-12-23 DIAGNOSIS — C3430 Malignant neoplasm of lower lobe, unspecified bronchus or lung: Secondary | ICD-10-CM

## 2012-12-23 DIAGNOSIS — M898X9 Other specified disorders of bone, unspecified site: Secondary | ICD-10-CM | POA: Insufficient documentation

## 2012-12-23 DIAGNOSIS — C3490 Malignant neoplasm of unspecified part of unspecified bronchus or lung: Secondary | ICD-10-CM | POA: Insufficient documentation

## 2012-12-23 DIAGNOSIS — J438 Other emphysema: Secondary | ICD-10-CM | POA: Insufficient documentation

## 2012-12-23 LAB — COMPREHENSIVE METABOLIC PANEL (CC13)
Albumin: 3.4 g/dL — ABNORMAL LOW (ref 3.5–5.0)
Alkaline Phosphatase: 68 U/L (ref 40–150)
BUN: 20.1 mg/dL (ref 7.0–26.0)
Calcium: 9.3 mg/dL (ref 8.4–10.4)
Chloride: 102 mEq/L (ref 98–109)
Glucose: 111 mg/dl (ref 70–140)
Potassium: 4.5 mEq/L (ref 3.5–5.1)
Sodium: 137 mEq/L (ref 136–145)
Total Bilirubin: 0.84 mg/dL (ref 0.20–1.20)
Total Protein: 7.9 g/dL (ref 6.4–8.3)

## 2012-12-23 LAB — CBC WITH DIFFERENTIAL/PLATELET
Basophils Absolute: 0 10*3/uL (ref 0.0–0.1)
EOS%: 0 % (ref 0.0–7.0)
Eosinophils Absolute: 0 10*3/uL (ref 0.0–0.5)
HCT: 25.7 % — ABNORMAL LOW (ref 38.4–49.9)
HGB: 8.3 g/dL — ABNORMAL LOW (ref 13.0–17.1)
LYMPH%: 17.3 % (ref 14.0–49.0)
MCH: 33.2 pg (ref 27.2–33.4)
MONO#: 0.6 10*3/uL (ref 0.1–0.9)
NEUT#: 2.8 10*3/uL (ref 1.5–6.5)
NEUT%: 68 % (ref 39.0–75.0)
Platelets: 42 10*3/uL — ABNORMAL LOW (ref 140–400)
RDW: 20.2 % — ABNORMAL HIGH (ref 11.0–14.6)
WBC: 4.1 10*3/uL (ref 4.0–10.3)
lymph#: 0.7 10*3/uL — ABNORMAL LOW (ref 0.9–3.3)

## 2012-12-23 MED ORDER — IOHEXOL 300 MG/ML  SOLN
80.0000 mL | Freq: Once | INTRAMUSCULAR | Status: AC | PRN
  Administered 2012-12-23: 80 mL via INTRAVENOUS

## 2012-12-24 ENCOUNTER — Encounter (HOSPITAL_COMMUNITY): Payer: Self-pay | Admitting: Emergency Medicine

## 2012-12-24 ENCOUNTER — Telehealth: Payer: Self-pay | Admitting: Internal Medicine

## 2012-12-24 ENCOUNTER — Emergency Department (HOSPITAL_COMMUNITY): Payer: Medicare Other

## 2012-12-24 ENCOUNTER — Encounter (INDEPENDENT_AMBULATORY_CARE_PROVIDER_SITE_OTHER): Payer: Self-pay

## 2012-12-24 ENCOUNTER — Other Ambulatory Visit: Payer: Self-pay

## 2012-12-24 ENCOUNTER — Emergency Department (HOSPITAL_COMMUNITY)
Admission: EM | Admit: 2012-12-24 | Discharge: 2012-12-24 | Disposition: A | Discharge status: Home / Self Care | Payer: Medicare Other | Attending: Emergency Medicine | Admitting: Emergency Medicine

## 2012-12-24 ENCOUNTER — Ambulatory Visit (HOSPITAL_BASED_OUTPATIENT_CLINIC_OR_DEPARTMENT_OTHER): Payer: Medicare Other | Admitting: Internal Medicine

## 2012-12-24 ENCOUNTER — Encounter: Payer: Self-pay | Admitting: Internal Medicine

## 2012-12-24 DIAGNOSIS — Z7982 Long term (current) use of aspirin: Secondary | ICD-10-CM | POA: Insufficient documentation

## 2012-12-24 DIAGNOSIS — Z85118 Personal history of other malignant neoplasm of bronchus and lung: Secondary | ICD-10-CM | POA: Insufficient documentation

## 2012-12-24 DIAGNOSIS — E785 Hyperlipidemia, unspecified: Secondary | ICD-10-CM | POA: Insufficient documentation

## 2012-12-24 DIAGNOSIS — R0609 Other forms of dyspnea: Secondary | ICD-10-CM

## 2012-12-24 DIAGNOSIS — R011 Cardiac murmur, unspecified: Secondary | ICD-10-CM | POA: Insufficient documentation

## 2012-12-24 DIAGNOSIS — G4700 Insomnia, unspecified: Secondary | ICD-10-CM | POA: Insufficient documentation

## 2012-12-24 DIAGNOSIS — R609 Edema, unspecified: Secondary | ICD-10-CM | POA: Insufficient documentation

## 2012-12-24 DIAGNOSIS — R0989 Other specified symptoms and signs involving the circulatory and respiratory systems: Secondary | ICD-10-CM

## 2012-12-24 DIAGNOSIS — Z79899 Other long term (current) drug therapy: Secondary | ICD-10-CM | POA: Insufficient documentation

## 2012-12-24 DIAGNOSIS — M129 Arthropathy, unspecified: Secondary | ICD-10-CM | POA: Insufficient documentation

## 2012-12-24 DIAGNOSIS — C3430 Malignant neoplasm of lower lobe, unspecified bronchus or lung: Secondary | ICD-10-CM

## 2012-12-24 DIAGNOSIS — I10 Essential (primary) hypertension: Secondary | ICD-10-CM | POA: Insufficient documentation

## 2012-12-24 DIAGNOSIS — Z9889 Other specified postprocedural states: Secondary | ICD-10-CM | POA: Insufficient documentation

## 2012-12-24 DIAGNOSIS — Z87442 Personal history of urinary calculi: Secondary | ICD-10-CM | POA: Insufficient documentation

## 2012-12-24 DIAGNOSIS — J8410 Pulmonary fibrosis, unspecified: Secondary | ICD-10-CM

## 2012-12-24 DIAGNOSIS — Z87891 Personal history of nicotine dependence: Secondary | ICD-10-CM | POA: Insufficient documentation

## 2012-12-24 DIAGNOSIS — Z87828 Personal history of other (healed) physical injury and trauma: Secondary | ICD-10-CM | POA: Insufficient documentation

## 2012-12-24 DIAGNOSIS — R0600 Dyspnea, unspecified: Secondary | ICD-10-CM

## 2012-12-24 LAB — COMPREHENSIVE METABOLIC PANEL
AST: 23 U/L (ref 0–37)
Albumin: 3.7 g/dL (ref 3.5–5.2)
BUN: 20 mg/dL (ref 6–23)
CO2: 22 mEq/L (ref 19–32)
Chloride: 96 mEq/L (ref 96–112)
Creatinine, Ser: 1.67 mg/dL — ABNORMAL HIGH (ref 0.50–1.35)
GFR calc non Af Amer: 41 mL/min — ABNORMAL LOW (ref >90)
Total Bilirubin: 0.6 mg/dL (ref 0.3–1.2)
Total Protein: 8.3 g/dL (ref 6.0–8.3)

## 2012-12-24 LAB — POCT I-STAT, CHEM 8
BUN: 21 mg/dL (ref 6–23)
Calcium, Ion: 1.16 mmol/L (ref 1.13–1.30)
Chloride: 103 mEq/L (ref 96–112)
Creatinine, Ser: 1.8 mg/dL — ABNORMAL HIGH (ref 0.50–1.35)
Glucose, Bld: 97 mg/dL (ref 70–99)
HCT: 28 % — ABNORMAL LOW (ref 39.0–52.0)
TCO2: 25 mmol/L (ref 0–100)

## 2012-12-24 LAB — CBC
MCH: 34.8 pg — ABNORMAL HIGH (ref 26.0–34.0)
MCV: 103.5 fL — ABNORMAL HIGH (ref 78.0–100.0)
Platelets: 46 10*3/uL — ABNORMAL LOW (ref 150–400)
RBC: 2.56 MIL/uL — ABNORMAL LOW (ref 4.22–5.81)
RDW: 18.3 % — ABNORMAL HIGH (ref 11.5–15.5)
WBC: 4 10*3/uL (ref 4.0–10.5)

## 2012-12-24 LAB — PRO B NATRIURETIC PEPTIDE: Pro B Natriuretic peptide (BNP): 71.1 pg/mL (ref 0–125)

## 2012-12-24 LAB — D-DIMER, QUANTITATIVE: D-Dimer, Quant: 1.61 ug/mL-FEU — ABNORMAL HIGH (ref 0.00–0.48)

## 2012-12-24 MED ORDER — ALBUTEROL SULFATE HFA 108 (90 BASE) MCG/ACT IN AERS
2.0000 | INHALATION_SPRAY | RESPIRATORY_TRACT | Status: DC | PRN
  Administered 2012-12-24: 2 via RESPIRATORY_TRACT
  Filled 2012-12-24: qty 6.7

## 2012-12-24 MED ORDER — PREDNISONE 20 MG PO TABS
60.0000 mg | ORAL_TABLET | Freq: Once | ORAL | Status: AC
  Administered 2012-12-24: 60 mg via ORAL
  Filled 2012-12-24: qty 3

## 2012-12-24 MED ORDER — PREDNISONE 10 MG PO TABS: 20.0000 mg | ORAL_TABLET | Freq: Every day | ORAL | Status: DC

## 2012-12-24 NOTE — ED Notes (Signed)
Pt c/o SOB with feet swelling x months getting more severe; pt sts hx of lobectomy; pt on 3L home O2 but did not bring with him
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
## 2012-12-24 NOTE — ED Notes (Signed)
Patient transported to X-ray 

## 2012-12-24 NOTE — Telephone Encounter (Signed)
Gave pt appt for lab and Md for January 2015 pt will see Dr. Shelle Iron with [facility] on 12/24th
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
## 2012-12-24 NOTE — Patient Instructions (Signed)
CURRENT THERAPY: Systemic chemotherapy with carboplatin for AUC of 5, paclitaxel 175 mg/M2 and Avastin 15 mg/kg with Neulasta support every 3 weeks. Status post 6 cycles. First cycle was given on 08/13/2012.   CHEMOTHERAPY INTENT: Palliative  CURRENT # OF CHEMOTHERAPY CYCLES: 6 CURRENT ANTIEMETICS: Zofran, dexamethasone.  CURRENT SMOKING STATUS: currently a nonsmoker  ORAL CHEMOTHERAPY AND CONSENT: None  CURRENT BISPHOSPHONATES USE: None  PAIN MANAGEMENT: well-controlled with oxycodone  NARCOTICS INDUCED CONSTIPATION: over the counter stool softener  LIVING WILL AND CODE STATUS: Full code  1) your recent scan showed no evidence for disease progression. 2) followup visit in 2 months with repeat CT scan of the chest for evaluation of her disease. 3) followup with Dr. Shelle Iron and Dr. Sharyn Lull for evaluation of your shortness of breath.

## 2013-01-22 ENCOUNTER — Encounter: Payer: Self-pay | Admitting: Pulmonary Disease

## 2013-01-22 ENCOUNTER — Ambulatory Visit (INDEPENDENT_AMBULATORY_CARE_PROVIDER_SITE_OTHER): Payer: Medicare Other | Admitting: Pulmonary Disease

## 2013-01-22 VITALS — BP 126/62 | HR 79 | Temp 97.4°F | Ht 70.25 in | Wt 289.2 lb

## 2013-01-22 DIAGNOSIS — E669 Obesity, unspecified: Secondary | ICD-10-CM

## 2013-01-22 DIAGNOSIS — J438 Other emphysema: Secondary | ICD-10-CM

## 2013-01-22 DIAGNOSIS — J9610 Chronic respiratory failure, unspecified whether with hypoxia or hypercapnia: Secondary | ICD-10-CM

## 2013-01-22 DIAGNOSIS — J984 Other disorders of lung: Secondary | ICD-10-CM

## 2013-01-22 DIAGNOSIS — J439 Emphysema, unspecified: Secondary | ICD-10-CM

## 2013-01-22 DIAGNOSIS — C3490 Malignant neoplasm of unspecified part of unspecified bronchus or lung: Secondary | ICD-10-CM

## 2013-01-22 NOTE — Patient Instructions (Signed)
Will try on striverdi to see if it will help your breathing.  Take 2 inhalations each am.  If this helps, please call us to send in a full prescription.  Albuterol inhaler, take 2 puffs every 6 hrs for emergencies only.  Talk with your primary doctor about fluid removal  followup with me in 4mos if you are doing better

## 2013-01-22 NOTE — Assessment & Plan Note (Signed)
The patient has mild COPD by his spirometry today, and I am willing to try him on a bronchodilator medication to see if it helps. However, I have explained to him this is not a major factor contributing to his dyspnea on exertion. He is overweight, extremely deconditioned and weakened since his chemotherapy, has had a lobectomy, and has significant lower extremity edema. All of this is contributing to his issue.

## 2013-01-22 NOTE — Progress Notes (Signed)
   Subjective:    Patient ID: [name], male    DOB: 1946-01-14, 67 y.o.   MRN: [id number]  HPI Patient comes in today for followup of his pulmonary status. He has known emphysema, but has never had COPD documented by pulmonary function studies. He does have restrictive lung disease related to his obesity. He is status post lobectomy for lung cancer, and has just finished chemotherapy for a recurrence. He has noticed increasing shortness of breath, but has not had a significant cough or purulence.  He does have significant lower extremity edema, and is on diuretics without successful diuresis. He has not had any fevers, chills, or sweats. He is continuing on his nasal oxygen.   Review of Systems  Constitutional: Positive for unexpected weight change. Negative for fever.  HENT: Negative for congestion, dental problem, ear pain, nosebleeds, postnasal drip, rhinorrhea, sinus pressure, sneezing, sore throat and trouble swallowing.   Eyes: Negative for redness and itching.  Respiratory: Positive for cough and shortness of breath. Negative for chest tightness and wheezing.   Cardiovascular: Positive for leg swelling. Negative for palpitations.  Gastrointestinal: Negative for nausea and vomiting.  Genitourinary: Negative for dysuria.  Musculoskeletal: Negative for joint swelling.  Skin: Negative for rash.  Neurological: Negative for headaches.  Hematological: Does not bruise/bleed easily.  Psychiatric/Behavioral: Negative for dysphoric mood. The patient is not nervous/anxious.        Objective:   Physical Exam Overweight male in no acute distress Nose without purulence or discharge noted Neck without lymphadenopathy or thyromegaly Chest with a few scattered crackles, no wheezing Cardiac exam with regular rate and rhythm Lower extremities with 3+ edema, no cyanosis Alert and oriented, moves all 4 extremities.       Assessment & Plan:

## 2013-02-21 ENCOUNTER — Ambulatory Visit (HOSPITAL_COMMUNITY)
Admission: RE | Admit: 2013-02-21 | Discharge: 2013-02-21 | Disposition: A | Discharge status: Home / Self Care | Payer: Medicare HMO | Source: Ambulatory Visit | Attending: Internal Medicine | Admitting: Internal Medicine

## 2013-02-21 ENCOUNTER — Other Ambulatory Visit (HOSPITAL_BASED_OUTPATIENT_CLINIC_OR_DEPARTMENT_OTHER): Payer: Medicare HMO

## 2013-02-21 DIAGNOSIS — C3490 Malignant neoplasm of unspecified part of unspecified bronchus or lung: Secondary | ICD-10-CM | POA: Insufficient documentation

## 2013-02-21 DIAGNOSIS — R0602 Shortness of breath: Secondary | ICD-10-CM | POA: Insufficient documentation

## 2013-02-21 DIAGNOSIS — C3430 Malignant neoplasm of lower lobe, unspecified bronchus or lung: Secondary | ICD-10-CM

## 2013-02-21 LAB — CBC WITH DIFFERENTIAL/PLATELET
BASO%: 0.2 % (ref 0.0–2.0)
Basophils Absolute: 0 10*3/uL (ref 0.0–0.1)
EOS ABS: 0.1 10*3/uL (ref 0.0–0.5)
EOS%: 1.6 % (ref 0.0–7.0)
HCT: 33.5 % — ABNORMAL LOW (ref 38.4–49.9)
HGB: 10.5 g/dL — ABNORMAL LOW (ref 13.0–17.1)
LYMPH%: 21 % (ref 14.0–49.0)
MCH: 32.3 pg (ref 27.2–33.4)
MCHC: 31.3 g/dL — AB (ref 32.0–36.0)
MCV: 103.1 fL — AB (ref 79.3–98.0)
MONO#: 0.7 10*3/uL (ref 0.1–0.9)
MONO%: 13.3 % (ref 0.0–14.0)
NEUT#: 3.2 10*3/uL (ref 1.5–6.5)
NEUT%: 63.9 % (ref 39.0–75.0)
PLATELETS: 127 10*3/uL — AB (ref 140–400)
RBC: 3.25 10*6/uL — AB (ref 4.20–5.82)
RDW: 17 % — ABNORMAL HIGH (ref 11.0–14.6)
WBC: 5 10*3/uL (ref 4.0–10.3)
lymph#: 1 10*3/uL (ref 0.9–3.3)

## 2013-02-21 LAB — COMPREHENSIVE METABOLIC PANEL (CC13)
ALK PHOS: 68 U/L (ref 40–150)
ALT: 12 U/L (ref 0–55)
AST: 19 U/L (ref 5–34)
Albumin: 3.5 g/dL (ref 3.5–5.0)
Anion Gap: 11 mEq/L (ref 3–11)
BUN: 13.5 mg/dL (ref 7.0–26.0)
CHLORIDE: 102 meq/L (ref 98–109)
CO2: 26 mEq/L (ref 22–29)
Calcium: 9.7 mg/dL (ref 8.4–10.4)
Creatinine: 1.3 mg/dL (ref 0.7–1.3)
Glucose: 111 mg/dl (ref 70–140)
POTASSIUM: 4.6 meq/L (ref 3.5–5.1)
Sodium: 139 mEq/L (ref 136–145)
TOTAL PROTEIN: 8.1 g/dL (ref 6.4–8.3)
Total Bilirubin: 0.6 mg/dL (ref 0.20–1.20)

## 2013-02-21 MED ORDER — IOHEXOL 300 MG/ML  SOLN
100.0000 mL | Freq: Once | INTRAMUSCULAR | Status: AC | PRN
  Administered 2013-02-21: 100 mL via INTRAVENOUS

## 2013-02-24 ENCOUNTER — Encounter (INDEPENDENT_AMBULATORY_CARE_PROVIDER_SITE_OTHER): Payer: Self-pay

## 2013-02-24 ENCOUNTER — Encounter: Payer: Self-pay | Admitting: Internal Medicine

## 2013-02-24 ENCOUNTER — Ambulatory Visit (HOSPITAL_BASED_OUTPATIENT_CLINIC_OR_DEPARTMENT_OTHER): Payer: Medicare HMO | Admitting: Internal Medicine

## 2013-02-24 ENCOUNTER — Telehealth: Payer: Self-pay | Admitting: Internal Medicine

## 2013-02-24 VITALS — BP 149/75 | HR 105 | Temp 97.0°F | Resp 20 | Ht 70.0 in | Wt 286.4 lb

## 2013-02-24 DIAGNOSIS — C3430 Malignant neoplasm of lower lobe, unspecified bronchus or lung: Secondary | ICD-10-CM

## 2013-02-24 DIAGNOSIS — G609 Hereditary and idiopathic neuropathy, unspecified: Secondary | ICD-10-CM

## 2013-02-24 DIAGNOSIS — C3490 Malignant neoplasm of unspecified part of unspecified bronchus or lung: Secondary | ICD-10-CM

## 2013-02-24 DIAGNOSIS — I10 Essential (primary) hypertension: Secondary | ICD-10-CM

## 2013-02-24 NOTE — Telephone Encounter (Signed)
gv and printed appt sched and avs for pt for April and May
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
## 2013-02-24 NOTE — Patient Instructions (Signed)
Followup visit in 3 months with repeat CT scan of the chest.

## 2013-03-19 ENCOUNTER — Emergency Department (HOSPITAL_COMMUNITY): Payer: Medicare HMO

## 2013-03-19 ENCOUNTER — Emergency Department (HOSPITAL_COMMUNITY)
Admission: EM | Admit: 2013-03-19 | Discharge: 2013-03-19 | Disposition: A | Discharge status: Home / Self Care | Payer: Medicare HMO | Attending: Emergency Medicine | Admitting: Emergency Medicine

## 2013-03-19 ENCOUNTER — Encounter (HOSPITAL_COMMUNITY): Payer: Self-pay | Admitting: Emergency Medicine

## 2013-03-19 DIAGNOSIS — Z9981 Dependence on supplemental oxygen: Secondary | ICD-10-CM | POA: Insufficient documentation

## 2013-03-19 DIAGNOSIS — Z7982 Long term (current) use of aspirin: Secondary | ICD-10-CM | POA: Insufficient documentation

## 2013-03-19 DIAGNOSIS — I10 Essential (primary) hypertension: Secondary | ICD-10-CM | POA: Insufficient documentation

## 2013-03-19 DIAGNOSIS — Z85118 Personal history of other malignant neoplasm of bronchus and lung: Secondary | ICD-10-CM | POA: Insufficient documentation

## 2013-03-19 DIAGNOSIS — Z87828 Personal history of other (healed) physical injury and trauma: Secondary | ICD-10-CM | POA: Insufficient documentation

## 2013-03-19 DIAGNOSIS — R059 Cough, unspecified: Secondary | ICD-10-CM | POA: Insufficient documentation

## 2013-03-19 DIAGNOSIS — Z79899 Other long term (current) drug therapy: Secondary | ICD-10-CM | POA: Insufficient documentation

## 2013-03-19 DIAGNOSIS — Z87891 Personal history of nicotine dependence: Secondary | ICD-10-CM | POA: Insufficient documentation

## 2013-03-19 DIAGNOSIS — E785 Hyperlipidemia, unspecified: Secondary | ICD-10-CM | POA: Insufficient documentation

## 2013-03-19 DIAGNOSIS — R05 Cough: Secondary | ICD-10-CM | POA: Insufficient documentation

## 2013-03-19 DIAGNOSIS — R0989 Other specified symptoms and signs involving the circulatory and respiratory systems: Secondary | ICD-10-CM | POA: Insufficient documentation

## 2013-03-19 DIAGNOSIS — R011 Cardiac murmur, unspecified: Secondary | ICD-10-CM | POA: Insufficient documentation

## 2013-03-19 DIAGNOSIS — Z87442 Personal history of urinary calculi: Secondary | ICD-10-CM | POA: Insufficient documentation

## 2013-03-19 DIAGNOSIS — M129 Arthropathy, unspecified: Secondary | ICD-10-CM | POA: Insufficient documentation

## 2013-03-19 DIAGNOSIS — R0609 Other forms of dyspnea: Secondary | ICD-10-CM | POA: Insufficient documentation

## 2013-03-19 DIAGNOSIS — I509 Heart failure, unspecified: Secondary | ICD-10-CM | POA: Insufficient documentation

## 2013-03-19 DIAGNOSIS — M7989 Other specified soft tissue disorders: Secondary | ICD-10-CM | POA: Insufficient documentation

## 2013-03-19 LAB — BASIC METABOLIC PANEL
BUN: 15 mg/dL (ref 6–23)
CALCIUM: 9.4 mg/dL (ref 8.4–10.5)
CO2: 23 mEq/L (ref 19–32)
Chloride: 102 mEq/L (ref 96–112)
Creatinine, Ser: 1.21 mg/dL (ref 0.50–1.35)
GFR, EST AFRICAN AMERICAN: 70 mL/min — AB (ref >90)
GFR, EST NON AFRICAN AMERICAN: 60 mL/min — AB (ref >90)
Glucose, Bld: 102 mg/dL — ABNORMAL HIGH (ref 70–99)
Potassium: 5 mEq/L (ref 3.7–5.3)
SODIUM: 136 meq/L — AB (ref 137–147)

## 2013-03-19 LAB — CBC WITH DIFFERENTIAL/PLATELET
BASOS ABS: 0 10*3/uL (ref 0.0–0.1)
Basophils Relative: 0 % (ref 0–1)
EOS PCT: 1 % (ref 0–5)
Eosinophils Absolute: 0.1 10*3/uL (ref 0.0–0.7)
HCT: 33.1 % — ABNORMAL LOW (ref 39.0–52.0)
Hemoglobin: 10.4 g/dL — ABNORMAL LOW (ref 13.0–17.0)
LYMPHS PCT: 18 % (ref 12–46)
Lymphs Abs: 1 10*3/uL (ref 0.7–4.0)
MCH: 32.2 pg (ref 26.0–34.0)
MCHC: 31.4 g/dL (ref 30.0–36.0)
MCV: 102.5 fL — AB (ref 78.0–100.0)
Monocytes Absolute: 0.7 10*3/uL (ref 0.1–1.0)
Monocytes Relative: 13 % — ABNORMAL HIGH (ref 3–12)
Neutro Abs: 3.7 10*3/uL (ref 1.7–7.7)
Neutrophils Relative %: 68 % (ref 43–77)
PLATELETS: 144 10*3/uL — AB (ref 150–400)
RBC: 3.23 MIL/uL — ABNORMAL LOW (ref 4.22–5.81)
RDW: 17.8 % — AB (ref 11.5–15.5)
WBC: 5.5 10*3/uL (ref 4.0–10.5)

## 2013-03-19 LAB — PRO B NATRIURETIC PEPTIDE: Pro B Natriuretic peptide (BNP): 968.3 pg/mL — ABNORMAL HIGH (ref 0–125)

## 2013-03-19 LAB — POCT I-STAT TROPONIN I: Troponin i, poc: 0.01 ng/mL (ref 0.00–0.08)

## 2013-03-19 MED ORDER — FUROSEMIDE 40 MG PO TABS: 80.0000 mg | ORAL_TABLET | Freq: Two times a day (BID) | ORAL | Status: DC

## 2013-03-19 MED ORDER — FUROSEMIDE 10 MG/ML IJ SOLN
80.0000 mg | Freq: Once | INTRAMUSCULAR | Status: AC
  Administered 2013-03-19: 80 mg via INTRAVENOUS
  Filled 2013-03-19: qty 8
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
## 2013-03-19 NOTE — ED Notes (Signed)
Pt sts increased SOB with exertion x 3 days; pt sts some swelling in legs; pt sts hx of lung CA last year; pt denies pain; pt noted to have low O2 sats; pt sts on 2L O2 at home but did not bring with him

## 2013-03-19 NOTE — ED Notes (Addendum)
Ambulated patient with checking Pulse Ox on 2L [place]. Pt maintained 93%-98% while ambulating, pt very dyspneic on exertion. Once back in treatment room, pt remained dyspneic and SpO2 dropped to 71% on 2L [place]. Increased O2 to 3L and coached patient on slow/deep breathing. Pt SpO2 rose to 95% after approximately 2 minutes. Pt able to slow his breathing and is in NAD at this time. Claiborne Billings PA and Dr. Rogene Houston aware

## 2013-03-19 NOTE — Discharge Instructions (Signed)
Recommend you take Lasix 80 mg twice a day and follow up with Dr. Terrence Dupont within 24 hours. Call office tomorrow morning, first thing, to schedule appointment. Return to the ED if symptoms worsen.  Shortness of Breath Shortness of breath means you have trouble breathing. Shortness of breath needs medical care right away. HOME CARE   Do not smoke.  Avoid being around chemicals or things (paint fumes, dust) that may bother your breathing.  Rest as needed. Slowly begin your normal activities.  Only take medicines as told by your doctor.  Keep all doctor visits as told. GET HELP RIGHT AWAY IF:   Your shortness of breath gets worse.  You feel lightheaded, pass out (faint), or have a cough that is not helped by medicine.  You cough up blood.  You have pain with breathing.  You have pain in your chest, arms, shoulders, or belly (abdomen).  You have a fever.  You cannot walk up stairs or exercise the way you normally do.  You do not get better in the time expected.  You have a hard time doing normal activities even with rest.  You have problems with your medicines.  You have any new symptoms. MAKE SURE YOU:  Understand these instructions.  Will watch your condition.  Will get help right away if you are not doing well or get worse. Document Released: 07/05/2007 Document Revised: 07/18/2011 Document Reviewed: 04/03/2011 [facility] Patient Information 2014 [place], [place].

## 2013-03-19 NOTE — ED Notes (Signed)
Pulse oximeter while ambulating @ 2 litres is 94%. RN Raquel Sarna notified.

## 2013-03-19 NOTE — ED Provider Notes (Addendum)
Medical screening examination/treatment/procedure(s) were conducted as a shared visit with non-physician practitioner(s) and myself.  I personally evaluated the patient during the encounter.  EKG Interpretation    Date/Time:  Wednesday March 19 2013 11:04:53 EST Ventricular Rate:  84 PR Interval:  184 QRS Duration: 86 QT Interval:  330 QTC Calculation: 389 R Axis:   2 Text Interpretation:  Normal sinus rhythm Nonspecific T wave abnormality Abnormal ECG No significant change since last tracing Confirmed by Garrette Caine  MD, McIntyre ([id number]) on 03/19/2013 12:29:24 PM            Patient presents with exertional shortness of breath for the past 3 days. Some swelling in his legs. Clinically suggestive of CHF has a history of that. His BNP is elevated. Patient also has a history of lung CA with a lobectomy. Patient's troponin here today is negative no chest pain. Chest x-ray being read as not consistent with CHF but clinically it seems to be what is going on. Patient will desat with exertion. Oxygen saturations at rest are normal. Patient will require admission.   Results for orders placed during the hospital encounter of 09/62/83  BASIC METABOLIC PANEL      Result Value Ref Range   Sodium 136 (*) 137 - 147 mEq/L   Potassium 5.0  3.7 - 5.3 mEq/L   Chloride 102  96 - 112 mEq/L   CO2 23  19 - 32 mEq/L   Glucose, Bld 102 (*) 70 - 99 mg/dL   BUN 15  6 - 23 mg/dL   Creatinine, Ser 1.21  0.50 - 1.35 mg/dL   Calcium 9.4  8.4 - 10.5 mg/dL   GFR calc non Af Amer 60 (*) >90 mL/min   GFR calc Af Amer 70 (*) >90 mL/min  PRO B NATRIURETIC PEPTIDE      Result Value Ref Range   Pro B Natriuretic peptide (BNP) 968.3 (*) 0 - 125 pg/mL  CBC WITH DIFFERENTIAL      Result Value Ref Range   WBC 5.5  4.0 - 10.5 K/uL   RBC 3.23 (*) 4.22 - 5.81 MIL/uL   Hemoglobin 10.4 (*) 13.0 - 17.0 g/dL   HCT 33.1 (*) 39.0 - 52.0 %   MCV 102.5 (*) 78.0 - 100.0 fL   MCH 32.2  26.0 - 34.0 pg   MCHC 31.4  30.0 - 36.0  g/dL   RDW 17.8 (*) 11.5 - 15.5 %   Platelets 144 (*) 150 - 400 K/uL   Neutrophils Relative % 68  43 - 77 %   Neutro Abs 3.7  1.7 - 7.7 K/uL   Lymphocytes Relative 18  12 - 46 %   Lymphs Abs 1.0  0.7 - 4.0 K/uL   Monocytes Relative 13 (*) 3 - 12 %   Monocytes Absolute 0.7  0.1 - 1.0 K/uL   Eosinophils Relative 1  0 - 5 %   Eosinophils Absolute 0.1  0.0 - 0.7 K/uL   Basophils Relative 0  0 - 1 %   Basophils Absolute 0.0  0.0 - 0.1 K/uL  POCT I-STAT TROPONIN I      Result Value Ref Range   Troponin i, poc 0.01  0.00 - 0.08 ng/mL   Comment 3            Dg Chest 2 View  03/19/2013   CLINICAL DATA:  Shortness of breath.  History of lung cancer.  EXAM: CHEST  2 VIEW  COMPARISON:  CT CHEST W/CM dated 02/21/2013;  DG CHEST 2 VIEW dated 12/24/2012  FINDINGS: Left subclavian Port-A-Cath is in place with tip overlying the mid to upper SVC. The cardiac silhouette remains upper limits of normal in size. Left hilar surgical clips and left lung volume loss related to prior left lobectomy are again seen. Reticular opacities in the left greater than right lung bases are unchanged. No definite pleural effusion is identified. No definite new airspace consolidation is seen. No pneumothorax is identified. No acute osseous abnormality is seen.  IMPRESSION: Stable appearance of the chest without evidence of acute airspace disease.   Electronically Signed   By: Logan Bores   On: 03/19/2013 12:34   Ct Chest W Contrast  02/21/2013   CLINICAL DATA:  Lung cancer diagnosed 2013, completed chemotherapy. Shortness of breath.  EXAM: CT CHEST WITH CONTRAST  TECHNIQUE: Multidetector CT imaging of the chest was performed during intravenous contrast administration.  CONTRAST:  153mL OMNIPAQUE IOHEXOL 300 MG/ML  SOLN  COMPARISON:  Chest radiograph 12/24/2012, chest CT 12/23/2012  FINDINGS: Severe emphysematous changes are reidentified with evidence of left lower lobectomy again noted. No mass lesion at the resection margin. Left  lower lobe pleural thickening is stable. Areas of bronchiectasis and subpleural reticular markings, possibly honeycombing, are reidentified in the left lower lobe. Central airways are patent. No new pulmonary nodule, mass, or consolidation. A few patchy areas of subpleural right sided reticular markings are stable. This most likely indicates chronic scarring and/or emphysematous change.  Left-sided Port-A-Cath in place with tip in the upper SVC. Thyroid appears normal. Mild atheromatous aortic calcification without aneurysm. Heart size at upper limits of normal. Incomplete imaging of the upper abdomen demonstrates normal-appearing adrenal glands. 5 mm pretracheal node is stable. 0.8 cm AP window lymph node is slightly larger, previously 0.6 cm.  No acute osseous abnormality or interval development of lytic or sclerotic osseous lesion. No compression deformity.  IMPRESSION: Stable postsurgical and emphysematous change status post left lower lobectomy for lung cancer. No CT evidence for intrathoracic recurrence or metastatic disease, apart from slight interval increase in in AP window lymph node, now 0.8 cm compared to 0.6 cm, amenable to followup.   Electronically Signed   By: Conchita Paris M.D.   On: 02/21/2013 13:39       Mervin Kung, MD 03/19/13 1322  After discussion with his cardiologist he recommended giving him IV Lasix here. Following the IV Lasix the patient improved significantly with the read ambulated and had a brief period of desaturation down to 88% but nothing like to 70% before. Patient overall felt a lot better most of time his saturations were above 90%. Patient is on long-term oxygen 2 L at home all the time. It has been determined in discussion with his cardiologist to be discharged back home with close followup.  Mervin Kung, MD 03/19/13 [phone]

## 2013-03-19 NOTE — ED Notes (Signed)
Pt placed on 4L [place]

## 2013-03-19 NOTE — ED Notes (Signed)
PT comfortable with d/c and f/u instructions. Pt states he has O2 at home and is able to  Use that this evening until his f/u tomorrow with Dr. Terrence Dupont. Pt advised on Lasix dosage change, verbalized understanding. Prescriptions for lasix given to pt.

## 2013-03-19 NOTE — ED Notes (Signed)
Pt SpO2 decreased to 88% on 2L [place] upon returned to room after walking. Pt in NAD and able to raise SpO2 back to 97% 2L [place] with slowed deep breathing

## 2013-03-26 ENCOUNTER — Ambulatory Visit (INDEPENDENT_AMBULATORY_CARE_PROVIDER_SITE_OTHER): Payer: Medicare HMO | Admitting: Podiatry

## 2013-03-26 ENCOUNTER — Encounter: Payer: Self-pay | Admitting: Podiatry

## 2013-03-26 VITALS — BP 160/67 | HR 84 | Resp 12

## 2013-03-26 DIAGNOSIS — M79609 Pain in unspecified limb: Secondary | ICD-10-CM

## 2013-03-26 DIAGNOSIS — B351 Tinea unguium: Secondary | ICD-10-CM

## 2013-03-26 NOTE — Progress Notes (Signed)
   Subjective:    Patient ID: [name], male    DOB: 01-Apr-1945, 68 y.o.   MRN: [id number]  HPI '' B/L THE TOENAILS ARE THICK, LONG AND HAVE DISCOLORATION. THE TOENAILS NEED TO BE TRIM.''  Patient presents with his son today complaining of painful toenails.    Review of Systems  All other systems reviewed and are negative.       Objective:   Physical Exam  Vascular: DP is are 2/4 bilaterally. PTs are 1/4 bilaterally  Neurological: Deferred  Dermatological: Dry skin noted bilaterally. Hypertrophic, incurvated, discolored toenails x10  Musculoskeletal: No deformities noted        Assessment & Plan:   Assessment: Symptomatic onychomycoses x10   Plan: Nails x10 debrided back without a bleeding. Reappoint at three-month intervals.

## 2013-03-27 ENCOUNTER — Encounter: Payer: Self-pay | Admitting: Podiatry

## 2013-05-23 ENCOUNTER — Ambulatory Visit: Payer: Medicare Other | Admitting: Pulmonary Disease

## 2013-05-26 ENCOUNTER — Other Ambulatory Visit (HOSPITAL_BASED_OUTPATIENT_CLINIC_OR_DEPARTMENT_OTHER): Payer: Commercial Managed Care - HMO

## 2013-05-26 ENCOUNTER — Ambulatory Visit (HOSPITAL_COMMUNITY)
Admission: RE | Admit: 2013-05-26 | Discharge: 2013-05-26 | Disposition: A | Discharge status: Home / Self Care | Payer: Medicare HMO | Source: Ambulatory Visit | Attending: Internal Medicine | Admitting: Internal Medicine

## 2013-05-26 DIAGNOSIS — R0602 Shortness of breath: Secondary | ICD-10-CM | POA: Insufficient documentation

## 2013-05-26 DIAGNOSIS — C3490 Malignant neoplasm of unspecified part of unspecified bronchus or lung: Secondary | ICD-10-CM | POA: Insufficient documentation

## 2013-05-26 DIAGNOSIS — C3430 Malignant neoplasm of lower lobe, unspecified bronchus or lung: Secondary | ICD-10-CM

## 2013-05-26 LAB — CBC WITH DIFFERENTIAL/PLATELET
BASO%: 0.6 % (ref 0.0–2.0)
Basophils Absolute: 0 10*3/uL (ref 0.0–0.1)
EOS%: 1.2 % (ref 0.0–7.0)
Eosinophils Absolute: 0.1 10*3/uL (ref 0.0–0.5)
HCT: 34.9 % — ABNORMAL LOW (ref 38.4–49.9)
HGB: 11.1 g/dL — ABNORMAL LOW (ref 13.0–17.1)
LYMPH%: 24.5 % (ref 14.0–49.0)
MCH: 31 pg (ref 27.2–33.4)
MCHC: 31.9 g/dL — ABNORMAL LOW (ref 32.0–36.0)
MCV: 97 fL (ref 79.3–98.0)
MONO#: 0.4 10*3/uL (ref 0.1–0.9)
MONO%: 9.6 % (ref 0.0–14.0)
NEUT#: 2.9 10*3/uL (ref 1.5–6.5)
NEUT%: 64.1 % (ref 39.0–75.0)
Platelets: 217 10*3/uL (ref 140–400)
RBC: 3.59 10*6/uL — ABNORMAL LOW (ref 4.20–5.82)
RDW: 18.2 % — ABNORMAL HIGH (ref 11.0–14.6)
WBC: 4.5 10*3/uL (ref 4.0–10.3)
lymph#: 1.1 10*3/uL (ref 0.9–3.3)

## 2013-05-26 LAB — COMPREHENSIVE METABOLIC PANEL (CC13)
ALT: 9 U/L (ref 0–55)
AST: 20 U/L (ref 5–34)
Albumin: 3.7 g/dL (ref 3.5–5.0)
Alkaline Phosphatase: 81 U/L (ref 40–150)
Anion Gap: 9 meq/L (ref 3–11)
BUN: 24.1 mg/dL (ref 7.0–26.0)
CO2: 25 meq/L (ref 22–29)
Calcium: 9.7 mg/dL (ref 8.4–10.4)
Chloride: 105 meq/L (ref 98–109)
Creatinine: 1.5 mg/dL — ABNORMAL HIGH (ref 0.7–1.3)
Glucose: 95 mg/dL (ref 70–140)
Potassium: 4.5 meq/L (ref 3.5–5.1)
Sodium: 139 meq/L (ref 136–145)
Total Bilirubin: 0.53 mg/dL (ref 0.20–1.20)
Total Protein: 8.2 g/dL (ref 6.4–8.3)

## 2013-05-26 MED ORDER — IOHEXOL 300 MG/ML  SOLN
80.0000 mL | Freq: Once | INTRAMUSCULAR | Status: AC | PRN
  Administered 2013-05-26: 80 mL via INTRAVENOUS

## 2013-06-02 ENCOUNTER — Ambulatory Visit (HOSPITAL_BASED_OUTPATIENT_CLINIC_OR_DEPARTMENT_OTHER): Payer: Commercial Managed Care - HMO

## 2013-06-02 ENCOUNTER — Telehealth: Payer: Self-pay | Admitting: Internal Medicine

## 2013-06-02 ENCOUNTER — Ambulatory Visit (HOSPITAL_BASED_OUTPATIENT_CLINIC_OR_DEPARTMENT_OTHER): Payer: Medicare HMO | Admitting: Internal Medicine

## 2013-06-02 ENCOUNTER — Encounter: Payer: Self-pay | Admitting: Internal Medicine

## 2013-06-02 VITALS — BP 153/44 | HR 80 | Temp 98.0°F | Resp 18 | Ht 70.0 in | Wt 282.4 lb

## 2013-06-02 DIAGNOSIS — Z95828 Presence of other vascular implants and grafts: Secondary | ICD-10-CM

## 2013-06-02 DIAGNOSIS — Z452 Encounter for adjustment and management of vascular access device: Secondary | ICD-10-CM

## 2013-06-02 DIAGNOSIS — C3430 Malignant neoplasm of lower lobe, unspecified bronchus or lung: Secondary | ICD-10-CM

## 2013-06-02 DIAGNOSIS — C3490 Malignant neoplasm of unspecified part of unspecified bronchus or lung: Secondary | ICD-10-CM

## 2013-06-02 MED ORDER — HEPARIN SOD (PORK) LOCK FLUSH 100 UNIT/ML IV SOLN
500.0000 [IU] | Freq: Once | INTRAVENOUS | Status: AC
  Administered 2013-06-02: 500 [IU] via INTRAVENOUS
  Filled 2013-06-02: qty 5

## 2013-06-02 MED ORDER — SODIUM CHLORIDE 0.9 % IJ SOLN
10.0000 mL | INTRAMUSCULAR | Status: DC | PRN
  Administered 2013-06-02: 10 mL via INTRAVENOUS
  Filled 2013-06-02: qty 10
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
## 2013-06-02 NOTE — Telephone Encounter (Signed)
gve the pt his may-jan 2016 appt calendar. Sent the pt back to the flush room.

## 2013-06-02 NOTE — Patient Instructions (Signed)

## 2013-06-12 ENCOUNTER — Encounter (HOSPITAL_COMMUNITY): Payer: Self-pay

## 2013-06-12 ENCOUNTER — Encounter (HOSPITAL_COMMUNITY)
Admission: RE | Admit: 2013-06-12 | Discharge: 2013-06-12 | Disposition: A | Discharge status: Home / Self Care | Payer: Medicare HMO | Source: Ambulatory Visit | Attending: Internal Medicine | Admitting: Internal Medicine

## 2013-06-12 DIAGNOSIS — J322 Chronic ethmoidal sinusitis: Secondary | ICD-10-CM | POA: Insufficient documentation

## 2013-06-12 DIAGNOSIS — C3490 Malignant neoplasm of unspecified part of unspecified bronchus or lung: Secondary | ICD-10-CM | POA: Insufficient documentation

## 2013-06-12 DIAGNOSIS — I2510 Atherosclerotic heart disease of native coronary artery without angina pectoris: Secondary | ICD-10-CM | POA: Insufficient documentation

## 2013-06-12 DIAGNOSIS — I700 Atherosclerosis of aorta: Secondary | ICD-10-CM | POA: Insufficient documentation

## 2013-06-12 DIAGNOSIS — I70209 Unspecified atherosclerosis of native arteries of extremities, unspecified extremity: Secondary | ICD-10-CM | POA: Insufficient documentation

## 2013-06-12 DIAGNOSIS — J438 Other emphysema: Secondary | ICD-10-CM | POA: Insufficient documentation

## 2013-06-12 DIAGNOSIS — N200 Calculus of kidney: Secondary | ICD-10-CM | POA: Insufficient documentation

## 2013-06-12 DIAGNOSIS — K429 Umbilical hernia without obstruction or gangrene: Secondary | ICD-10-CM | POA: Insufficient documentation

## 2013-06-12 DIAGNOSIS — J323 Chronic sphenoidal sinusitis: Secondary | ICD-10-CM | POA: Insufficient documentation

## 2013-06-12 DIAGNOSIS — I517 Cardiomegaly: Secondary | ICD-10-CM | POA: Insufficient documentation

## 2013-06-12 DIAGNOSIS — R599 Enlarged lymph nodes, unspecified: Secondary | ICD-10-CM | POA: Insufficient documentation

## 2013-06-12 LAB — GLUCOSE, CAPILLARY: Glucose-Capillary: 96 mg/dL (ref 70–99)

## 2013-06-12 MED ORDER — FLUDEOXYGLUCOSE F - 18 (FDG) INJECTION
15.0000 | Freq: Once | INTRAVENOUS | Status: AC | PRN
Start: 2013-06-12 — End: 2013-06-12
  Administered 2013-06-12: 15 via INTRAVENOUS

## 2013-06-16 ENCOUNTER — Ambulatory Visit: Payer: Medicare HMO | Admitting: Physician Assistant

## 2013-06-17 ENCOUNTER — Ambulatory Visit (HOSPITAL_BASED_OUTPATIENT_CLINIC_OR_DEPARTMENT_OTHER): Payer: Medicare HMO | Admitting: Physician Assistant

## 2013-06-17 ENCOUNTER — Telehealth: Payer: Self-pay | Admitting: Internal Medicine

## 2013-06-17 ENCOUNTER — Encounter: Payer: Self-pay | Admitting: Physician Assistant

## 2013-06-17 VITALS — BP 110/66 | HR 66 | Temp 96.9°F | Resp 18 | Ht 70.0 in | Wt 277.3 lb

## 2013-06-17 DIAGNOSIS — Z85118 Personal history of other malignant neoplasm of bronchus and lung: Secondary | ICD-10-CM

## 2013-06-17 DIAGNOSIS — R599 Enlarged lymph nodes, unspecified: Secondary | ICD-10-CM

## 2013-06-17 DIAGNOSIS — C3490 Malignant neoplasm of unspecified part of unspecified bronchus or lung: Secondary | ICD-10-CM
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
## 2013-06-17 NOTE — Telephone Encounter (Signed)
gv pt barium

## 2013-06-17 NOTE — Telephone Encounter (Signed)
gv adn pritned appt sched and avs for pt for June thru Dec

## 2013-06-20 NOTE — Patient Instructions (Signed)
Your PET scan confirmed the mildly enlarged mediastinal lymph nodes however they were only faintly hyper metabolic. We will continue to watch these areas closely when you return in 3 months with restaging CT scan of your chest, abdomen and pelvis to reevaluate your disease.

## 2013-06-25 ENCOUNTER — Ambulatory Visit: Payer: Medicare HMO | Admitting: Podiatry

## 2013-07-14 ENCOUNTER — Ambulatory Visit (HOSPITAL_BASED_OUTPATIENT_CLINIC_OR_DEPARTMENT_OTHER): Payer: Medicare HMO

## 2013-07-14 VITALS — BP 121/68 | HR 77 | Temp 97.0°F

## 2013-07-14 DIAGNOSIS — C3430 Malignant neoplasm of lower lobe, unspecified bronchus or lung: Secondary | ICD-10-CM

## 2013-07-14 DIAGNOSIS — Z452 Encounter for adjustment and management of vascular access device: Secondary | ICD-10-CM

## 2013-07-14 DIAGNOSIS — Z95828 Presence of other vascular implants and grafts: Secondary | ICD-10-CM

## 2013-07-14 MED ORDER — SODIUM CHLORIDE 0.9 % IJ SOLN
10.0000 mL | INTRAMUSCULAR | Status: DC | PRN
  Administered 2013-07-14: 10 mL via INTRAVENOUS
  Filled 2013-07-14: qty 10

## 2013-07-14 MED ORDER — HEPARIN SOD (PORK) LOCK FLUSH 100 UNIT/ML IV SOLN
500.0000 [IU] | Freq: Once | INTRAVENOUS | Status: AC
  Administered 2013-07-14: 500 [IU] via INTRAVENOUS
  Filled 2013-07-14: qty 5

## 2013-07-14 NOTE — Patient Instructions (Signed)

## 2013-08-08 ENCOUNTER — Other Ambulatory Visit: Payer: Self-pay | Admitting: *Deleted

## 2013-08-09 ENCOUNTER — Telehealth: Payer: Self-pay | Admitting: Internal Medicine

## 2013-08-09 NOTE — Telephone Encounter (Signed)
lmonvm advising the pt that his lab appt has been moved form 09/15/2013 to the same day as the ct scan on 09/12/2013 @ 9:00am.

## 2013-08-25 ENCOUNTER — Ambulatory Visit (HOSPITAL_BASED_OUTPATIENT_CLINIC_OR_DEPARTMENT_OTHER): Payer: Medicare HMO

## 2013-08-25 VITALS — BP 123/75 | HR 96 | Temp 97.0°F

## 2013-08-25 DIAGNOSIS — Z452 Encounter for adjustment and management of vascular access device: Secondary | ICD-10-CM

## 2013-08-25 DIAGNOSIS — Z85118 Personal history of other malignant neoplasm of bronchus and lung: Secondary | ICD-10-CM

## 2013-08-25 DIAGNOSIS — Z95828 Presence of other vascular implants and grafts: Secondary | ICD-10-CM

## 2013-08-25 MED ORDER — HEPARIN SOD (PORK) LOCK FLUSH 100 UNIT/ML IV SOLN
500.0000 [IU] | Freq: Once | INTRAVENOUS | Status: AC
  Administered 2013-08-25: 500 [IU] via INTRAVENOUS
  Filled 2013-08-25: qty 5

## 2013-08-25 MED ORDER — SODIUM CHLORIDE 0.9 % IJ SOLN
10.0000 mL | INTRAMUSCULAR | Status: DC | PRN
  Administered 2013-08-25: 10 mL via INTRAVENOUS
  Filled 2013-08-25: qty 10

## 2013-08-25 NOTE — Patient Instructions (Signed)

## 2013-09-12 ENCOUNTER — Other Ambulatory Visit (HOSPITAL_BASED_OUTPATIENT_CLINIC_OR_DEPARTMENT_OTHER): Payer: Medicare HMO

## 2013-09-12 ENCOUNTER — Ambulatory Visit (HOSPITAL_COMMUNITY)
Admission: RE | Admit: 2013-09-12 | Discharge: 2013-09-12 | Disposition: A | Discharge status: Home / Self Care | Payer: Medicare HMO | Source: Ambulatory Visit | Attending: Physician Assistant | Admitting: Physician Assistant

## 2013-09-12 ENCOUNTER — Encounter (HOSPITAL_COMMUNITY): Payer: Self-pay

## 2013-09-12 DIAGNOSIS — Z902 Acquired absence of lung [part of]: Secondary | ICD-10-CM | POA: Insufficient documentation

## 2013-09-12 DIAGNOSIS — C3490 Malignant neoplasm of unspecified part of unspecified bronchus or lung: Secondary | ICD-10-CM

## 2013-09-12 DIAGNOSIS — Z9221 Personal history of antineoplastic chemotherapy: Secondary | ICD-10-CM | POA: Diagnosis not present

## 2013-09-12 DIAGNOSIS — C3430 Malignant neoplasm of lower lobe, unspecified bronchus or lung: Secondary | ICD-10-CM

## 2013-09-12 LAB — COMPREHENSIVE METABOLIC PANEL (CC13)
ALK PHOS: 70 U/L (ref 40–150)
ALT: 12 U/L (ref 0–55)
AST: 24 U/L (ref 5–34)
Albumin: 3.6 g/dL (ref 3.5–5.0)
Anion Gap: 9 mEq/L (ref 3–11)
BILIRUBIN TOTAL: 0.71 mg/dL (ref 0.20–1.20)
BUN: 26.9 mg/dL — AB (ref 7.0–26.0)
CO2: 26 mEq/L (ref 22–29)
Calcium: 9.3 mg/dL (ref 8.4–10.4)
Chloride: 104 mEq/L (ref 98–109)
Creatinine: 1.5 mg/dL — ABNORMAL HIGH (ref 0.7–1.3)
GLUCOSE: 93 mg/dL (ref 70–140)
Potassium: 4.3 mEq/L (ref 3.5–5.1)
Sodium: 139 mEq/L (ref 136–145)
Total Protein: 7.6 g/dL (ref 6.4–8.3)

## 2013-09-12 LAB — CBC WITH DIFFERENTIAL/PLATELET
BASO%: 0 % (ref 0.0–2.0)
Basophils Absolute: 0 10*3/uL (ref 0.0–0.1)
EOS%: 1.2 % (ref 0.0–7.0)
Eosinophils Absolute: 0 10*3/uL (ref 0.0–0.5)
HCT: 38.3 % — ABNORMAL LOW (ref 38.4–49.9)
HGB: 12.1 g/dL — ABNORMAL LOW (ref 13.0–17.1)
LYMPH%: 28.6 % (ref 14.0–49.0)
MCH: 31.3 pg (ref 27.2–33.4)
MCHC: 31.6 g/dL — ABNORMAL LOW (ref 32.0–36.0)
MCV: 99 fL — ABNORMAL HIGH (ref 79.3–98.0)
MONO#: 0.5 10*3/uL (ref 0.1–0.9)
MONO%: 13.9 % (ref 0.0–14.0)
NEUT#: 2 10*3/uL (ref 1.5–6.5)
NEUT%: 56.3 % (ref 39.0–75.0)
Platelets: 149 10*3/uL (ref 140–400)
RBC: 3.87 10*6/uL — ABNORMAL LOW (ref 4.20–5.82)
RDW: 17.1 % — ABNORMAL HIGH (ref 11.0–14.6)
WBC: 3.5 10*3/uL — ABNORMAL LOW (ref 4.0–10.3)
lymph#: 1 10*3/uL (ref 0.9–3.3)

## 2013-09-12 MED ORDER — IOHEXOL 300 MG/ML  SOLN
100.0000 mL | Freq: Once | INTRAMUSCULAR | Status: AC | PRN
  Administered 2013-09-12: 100 mL via INTRAVENOUS

## 2013-09-15 ENCOUNTER — Other Ambulatory Visit: Payer: Commercial Managed Care - HMO

## 2013-09-15 ENCOUNTER — Encounter: Payer: Self-pay | Admitting: Internal Medicine

## 2013-09-15 ENCOUNTER — Telehealth: Payer: Self-pay | Admitting: Internal Medicine

## 2013-09-15 ENCOUNTER — Ambulatory Visit (HOSPITAL_BASED_OUTPATIENT_CLINIC_OR_DEPARTMENT_OTHER): Payer: Medicare HMO | Admitting: Internal Medicine

## 2013-09-15 VITALS — BP 127/63 | HR 60 | Temp 97.8°F | Resp 18 | Ht 70.0 in | Wt 288.7 lb

## 2013-09-15 DIAGNOSIS — Z85118 Personal history of other malignant neoplasm of bronchus and lung: Secondary | ICD-10-CM

## 2013-09-15 DIAGNOSIS — C3410 Malignant neoplasm of upper lobe, unspecified bronchus or lung: Secondary | ICD-10-CM
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
## 2013-09-15 NOTE — Telephone Encounter (Signed)
Pt confirmed labs/ov per 08/17 POF, gave pt AVS.....KJ °

## 2013-10-31 ENCOUNTER — Ambulatory Visit (INDEPENDENT_AMBULATORY_CARE_PROVIDER_SITE_OTHER): Payer: Medicare HMO | Admitting: Internal Medicine

## 2013-10-31 ENCOUNTER — Encounter: Payer: Self-pay | Admitting: Internal Medicine

## 2013-10-31 VITALS — BP 102/64 | HR 72 | Ht 72.0 in | Wt 301.0 lb

## 2013-10-31 DIAGNOSIS — J9611 Chronic respiratory failure with hypoxia: Secondary | ICD-10-CM

## 2013-10-31 DIAGNOSIS — R0600 Dyspnea, unspecified: Secondary | ICD-10-CM | POA: Insufficient documentation

## 2013-10-31 MED ORDER — PREDNISONE 10 MG PO TABS: ORAL_TABLET | ORAL | Status: DC

## 2013-10-31 MED ORDER — FLUTICASONE PROPIONATE HFA 44 MCG/ACT IN AERO: 2.0000 | INHALATION_SPRAY | Freq: Two times a day (BID) | RESPIRATORY_TRACT | Status: DC

## 2013-10-31 MED ORDER — TIOTROPIUM BROMIDE MONOHYDRATE 2.5 MCG/ACT IN AERS: 2.0000 | INHALATION_SPRAY | Freq: Every day | RESPIRATORY_TRACT | Status: DC

## 2013-10-31 NOTE — Patient Instructions (Addendum)
You likely might have a flare up of copd/emphysema making this worse past few weeks In addition other thinks making you worse are   - moderate copd/emphysema  - removal of left lower lobe in Oct 2013  - weight  - heart issues  - beta blocker coreg used for heart esp in setting of copd  - lack of physical fitness   PLAN  -  Continue oxygen  - start spiriva respimat  - start flovent mdi 80mcg 2 puff  Twice daily  -Please take prednisone 40 mg x1 day, then 30 mg x1 day, then 20 mg x1 day, then 10 mg x1 day, and then 5 mg x1 day and stop - My nurse will call Dr Terrence Dupont office and get your coreg changed to metoprolol; we will call you this  - Set up home physical therapy   Followup  3 weeks with my NP Tammy   let front desk know you are switching from Dr Gwenette Greet to Dr Chase Caller

## 2013-10-31 NOTE — Progress Notes (Signed)
Subjective:    Patient ID: [name], male    DOB: 1945/08/19, 68 y.o.   MRN: [id number]  HPI    OV 10/31/2013  Chief Complaint  Patient presents with  . Acute Visit    [place] pt. Pt c/o DOE since lung biopsy in 2013. Pt states he only coughs 1-2 times per day and brings up yellow mucus. Pt denies CP/tightness.    Here for acute visit but for chronic dyspnea. Switching PCCM providers from Dr Gwenette Greet to Dr Chase Caller   11/21/11 - LLL  adenoca  - CT shows BAC like pattern. Confirmed on TTbx and then subsequent LLL lobectomy. Diagnosed with Stage 2B T3, NO, Mo NSCLC (tumor size 11.5cm) and then Rx with cisplaint and limta thrugh April 2014 followed by 6 cycles of paclitaxel and avastin through early to mid 2015. His most recent CT Aug 2014 did not show disease progression but hasd some increase in mediastinal nodes  His main issue that he is having significant dyspnea - class 2-3 with exertion ad relieved by rest since lobectomy 2 years ago in Oct 2013.  He is says overall it is unchanged. Moderate in severity but sometimes severe in severity. Clearly exertional. Rest helps. CHronic o2 Rx x 2 years  Not helping. Says cardiac status is optimal per his understanding through Dr Terrence Dupont. He is frustrated. Past few months thinks dyspnea might be wrose. Past few weeks noticing random wheezing. No associted cough, orthopnea, pnd, hemoptysis  He did see Dr Gwenette Greet in dec 2014 who tried him on stiolto and explained multifactorial nature of dyspnea but he does not remember.  Other dyspnea relevant details   - Obese : Body mass index is 40.81 kg/(m^2).Marland Kitchen No formal dx of OSA but at risk - s/p lobectomy 2013 : Spirometry 01/22/13:  - moderate obstruction - fev1 1.9L/62%, RAtio 64. Currently untreated other than 02  - Presumed diastolic cHf based on med list and normal echo may 2014 - sees DR Lona Kettle- on non specific beta blocker Coreg - DEconditioned- does only limited ADLs at home - gets dyspneic. Never  been to rehab post chemo or lobectomy      Review of Systems  Constitutional: Negative for fever and unexpected weight change.  HENT: Negative for congestion, dental problem, ear pain, nosebleeds, postnasal drip, rhinorrhea, sinus pressure, sneezing, sore throat and trouble swallowing.   Eyes: Negative for redness and itching.  Respiratory: Positive for cough and shortness of breath. Negative for chest tightness and wheezing.   Cardiovascular: Positive for leg swelling. Negative for palpitations.  Gastrointestinal: Negative for nausea and vomiting.  Genitourinary: Negative for dysuria.  Musculoskeletal: Negative for joint swelling.  Skin: Negative for rash.  Neurological: Negative for headaches.  Hematological: Does not bruise/bleed easily.  Psychiatric/Behavioral: Negative for dysphoric mood. The patient is not nervous/anxious.    Current outpatient prescriptions:aspirin EC 81 MG tablet, Take 81 mg by mouth daily., Disp: , Rfl: ;  atorvastatin (LIPITOR) 40 MG tablet, Take 40 mg by mouth daily., Disp: , Rfl: ;  carvedilol (COREG) 3.125 MG tablet, Take 3.125 mg by mouth 2 (two) times daily with a meal. , Disp: , Rfl: ;  digoxin (LANOXIN) 0.125 MG tablet, Take 0.5 tablets (0.0625 mg total) by mouth daily., Disp: 30 tablet, Rfl: 3 furosemide (LASIX) 40 MG tablet, Take 2 tablets (80 mg total) by mouth 2 (two) times daily., Disp: 20 tablet, Rfl: 0;  potassium chloride SA (K-DUR,KLOR-CON) 20 MEQ tablet, Take 1 tablet (20 mEq total) by  mouth 2 (two) times daily., Disp: 60 tablet, Rfl: 3;  ramipril (ALTACE) 2.5 MG capsule, Take 1 capsule (2.5 mg total) by mouth daily., Disp: 30 capsule, Rfl: 3 No current facility-administered medications for this visit. Facility-Administered Medications Ordered in Other Visits: sodium chloride 0.9 % injection 10 mL, 10 mL, Intracatheter, PRN, Curt Bears, MD, 10 mL at 09/03/12 1534     Objective:   Physical Exam  Nursing note and vitals  reviewed. Constitutional: He is oriented to person, place, and time. He appears well-developed and well-nourished. No distress.  Obese Body mass index is 40.81 kg/(m^2). Sitting in wheel chair  Looks deconditioned.   HENT:  Head: Normocephalic and atraumatic.  Right Ear: External ear normal.  Left Ear: External ear normal.  Mouth/Throat: Oropharynx is clear and moist. No oropharyngeal exudate.  o2 on  Eyes: Conjunctivae and EOM are normal. Pupils are equal, round, and reactive to light. Right eye exhibits no discharge. Left eye exhibits no discharge. No scleral icterus.  Neck: Normal range of motion. Neck supple. No JVD present. No tracheal deviation present. No thyromegaly present.  Cardiovascular: Normal rate, regular rhythm and intact distal pulses.  Exam reveals no gallop and no friction rub.   No murmur heard. Pulmonary/Chest: Effort normal and breath sounds normal. No respiratory distress. He has no wheezes. He has no rales. He exhibits no tenderness.  occ scattered wheeze +  Abdominal: Soft. Bowel sounds are normal. He exhibits no distension and no mass. There is no tenderness. There is no rebound and no guarding.  Musculoskeletal: Normal range of motion. He exhibits edema. He exhibits no tenderness.  Chronic venous stasis edema +  Lymphadenopathy:    He has no cervical adenopathy.  Neurological: He is alert and oriented to person, place, and time. He has normal reflexes. No cranial nerve deficit. Coordination normal.  Skin: Skin is warm and dry. No rash noted. He is not diaphoretic. No erythema. No pallor.  Psychiatric: He has a normal mood and affect. His behavior is normal. Judgment and thought content normal.    Filed Vitals:   10/31/13 1540  BP: 102/64  Pulse: 72  Height: 6' (1.829 m)  Weight: 301 lb (136.533 kg)  SpO2: 98%         Assessment & Plan:  #Chronic respiratory Failure #Possible flare up COPD  You likely might have a flare up of copd/emphysema  making this worse past few weeks In addition other thinks making you worse are   - moderate copd/emphysema  - removal of left lower lobe in Oct 2013  - weight  - heart issues  - beta blocker coreg used for heart esp in setting of copd  - lack of physical fitness   PLAN  -  Continue oxygen  - start spiriva respimat  - start flovent mdi 39mcg 2 puff  Twice daily  -Please take prednisone 40 mg x1 day, then 30 mg x1 day, then 20 mg x1 day, then 10 mg x1 day, and then 5 mg x1 day and stop - My nurse will call Dr Terrence Dupont office and get your coreg changed to metoprolol; we will call you this  - Set up home physical therapy   Followup  3 weeks with my NP Tammy   let front desk know you are switching from Dr Gwenette Greet to Dr Chase Caller      Dr. Brand Males, M.D., [facility]P Pulmonary and Critical Care Medicine Staff Physician [place] [facility] Pager: [phone]  [phone], If no answer or between  15:00h - 7:00h: call [phone]  10/31/2013 4:31 PM

## 2013-11-03 ENCOUNTER — Telehealth: Payer: Self-pay | Admitting: Internal Medicine

## 2013-11-03 NOTE — Telephone Encounter (Signed)
Called spoke with Shanon Brow. He reports since pt has been on pred taper he is doing much better. Pt is 98% w/ his O2 on. Pt is not feeling SOB. He is able to walk around independently now. FYI for [place]. Pt saw MR as acute visit on 10/31/13 for [place]. Will send to [facility] as an [place]

## 2013-11-17 ENCOUNTER — Ambulatory Visit (HOSPITAL_BASED_OUTPATIENT_CLINIC_OR_DEPARTMENT_OTHER): Payer: Medicare HMO

## 2013-11-17 VITALS — BP 142/83 | HR 88 | Temp 97.3°F

## 2013-11-17 DIAGNOSIS — C3412 Malignant neoplasm of upper lobe, left bronchus or lung: Secondary | ICD-10-CM

## 2013-11-17 DIAGNOSIS — Z95828 Presence of other vascular implants and grafts: Secondary | ICD-10-CM

## 2013-11-17 DIAGNOSIS — Z452 Encounter for adjustment and management of vascular access device: Secondary | ICD-10-CM

## 2013-11-17 MED ORDER — SODIUM CHLORIDE 0.9 % IJ SOLN
10.0000 mL | INTRAMUSCULAR | Status: DC | PRN
  Administered 2013-11-17: 10 mL via INTRAVENOUS
  Filled 2013-11-17: qty 10

## 2013-11-17 MED ORDER — HEPARIN SOD (PORK) LOCK FLUSH 100 UNIT/ML IV SOLN
500.0000 [IU] | Freq: Once | INTRAVENOUS | Status: AC
  Administered 2013-11-17: 500 [IU] via INTRAVENOUS
  Filled 2013-11-17: qty 5

## 2013-11-17 NOTE — Patient Instructions (Signed)

## 2013-11-21 ENCOUNTER — Encounter: Payer: Self-pay | Admitting: Adult Health

## 2013-11-21 ENCOUNTER — Ambulatory Visit (INDEPENDENT_AMBULATORY_CARE_PROVIDER_SITE_OTHER): Payer: Medicare HMO | Admitting: Adult Health

## 2013-11-21 VITALS — BP 116/66 | HR 71 | Ht 71.0 in | Wt 313.0 lb

## 2013-11-21 DIAGNOSIS — J439 Emphysema, unspecified: Secondary | ICD-10-CM

## 2013-11-21 MED ORDER — TIOTROPIUM BROMIDE MONOHYDRATE 18 MCG IN CAPS: 18.0000 ug | ORAL_CAPSULE | Freq: Every day | RESPIRATORY_TRACT | Status: DC

## 2013-11-21 MED ORDER — VALSARTAN 160 MG PO TABS: 160.0000 mg | ORAL_TABLET | Freq: Every day | ORAL | Status: DC

## 2013-11-21 NOTE — Assessment & Plan Note (Signed)
Emphysema w/ no significant airflow obstruction on spirometry  S/p lobectomy  Unclear what his insurance will cover  Will try spiriva for now , check with insurance for coverage.  Suspect his dyspnea is multifactoral with obesity , deconditioning , chronic edema with diastolic dysfunction on max dose of lasix .  ACE inhibitor and Coreg may be contributing to ongoing symptoms  Will give trial off ACE  Would like him to follow up with Dr. Terrence Dupont regarding changing Coreg to Bisoprolol and rechecking b/p on ARB   Plan  Try Spiriva Handihaler 1 puff daily  Call back if not covered by your insurance .  Stop Altace .  Begin Diovan 160mg  daily  follow up Dr. Terrence Dupont in 3 weeks for blood pressure check .  Follow up Dr. Chase Caller in 6 weeks and As needed   Please contact office for sooner follow up if symptoms do not improve or worsen or seek emergency care

## 2013-11-21 NOTE — Addendum Note (Signed)
Addended by: Parke Poisson E on: 11/21/2013 02:42 PM   Modules accepted: Orders

## 2013-11-21 NOTE — Patient Instructions (Addendum)
Try Spiriva Handihaler 1 puff daily  Call back if not covered by your insurance .  Stop Altace .  Begin Diovan 160mg  daily  follow up Dr. Terrence Dupont in 3 weeks for blood pressure check .  Follow up Dr. Chase Caller in 6 weeks and As needed   Please contact office for sooner follow up if symptoms do not improve or worsen or seek emergency care

## 2013-11-21 NOTE — Progress Notes (Signed)
Subjective:    Patient ID: [name], male    DOB: 27-Nov-1945, 68 y.o.   MRN: [id number]  HPI OV 10/31/2013  Chief Complaint  Patient presents with  . Acute Visit    [place] pt. Pt c/o DOE since lung biopsy in 2013. Pt states he only coughs 1-2 times per day and brings up yellow mucus. Pt denies CP/tightness.    Here for acute visit but for chronic dyspnea. Switching PCCM providers from Dr Gwenette Greet to Dr Chase Caller   11/21/11 - LLL  adenoca  - CT shows BAC like pattern. Confirmed on TTbx and then subsequent LLL lobectomy. Diagnosed with Stage 2B T3, NO, Mo NSCLC (tumor size 11.5cm) and then Rx with cisplaint and limta thrugh April 2014 followed by 6 cycles of paclitaxel and avastin through early to mid 2015. His most recent CT Aug 2014 did not show disease progression but hasd some increase in mediastinal nodes  His main issue that he is having significant dyspnea - class 2-3 with exertion ad relieved by rest since lobectomy 2 years ago in Oct 2013.  He is says overall it is unchanged. Moderate in severity but sometimes severe in severity. Clearly exertional. Rest helps. CHronic o2 Rx x 2 years  Not helping. Says cardiac status is optimal per his understanding through Dr Terrence Dupont. He is frustrated. Past few months thinks dyspnea might be wrose. Past few weeks noticing random wheezing. No associted cough, orthopnea, pnd, hemoptysis  He did see Dr Gwenette Greet in dec 2014 who tried him on stiolto and explained multifactorial nature of dyspnea but he does not remember.  Other dyspnea relevant details   - Obese : Body mass index is 40.81 kg/(m^2).Marland Kitchen No formal dx of OSA but at risk - s/p lobectomy 2013 : Spirometry 01/22/13:  - moderate obstruction - fev1 1.9L/62%, RAtio 64. Currently untreated other than 02  - Presumed diastolic cHf based on med list and normal echo may 2014 - sees DR Lona Kettle- on non specific beta blocker Coreg - DEconditioned- does only limited ADLs at home - gets dyspneic. Never been  to rehab post chemo or lobectomy    11/21/2013 Follow up    LLL  adenoca  - CT shows BAC like pattern. Confirmed on TTbx and then subsequent LLL lobectomy. Diagnosed with Stage 2B T3, NO, Mo NSCLC (tumor size 11.5cm) and then Rx with cisplaint and limta thrugh April 2014 followed by 6 cycles of paclitaxel and avastin through early to mid 2015. His most recent CT Aug 2015 did not show disease progression but hasd some increase in mediastinal nodes  Pt returns for follow up . Seen 3 weeks ago for increased cough and shortness of breath over last 2 years, (increased after lung surgery)  Pt was started on Flovent and Spiriva  And given prednisone taper for presumed COPD flare . He says he felt some better but was unable to get inhalers at pharmacy, does not think they are covered. He complains thet he still has cough and intermittent wheezes.  Spirometry today shows restrictive process with FVC at 53% FEV1 51 and ratio 74.Decreased mid flows .  Of note he remains on ACE inhibitor and non selective beta blocker.  Denies chest pain , orthopnea,  N/v/d, overt reflux, fever or discolored mucus .  He has chronic leg swelling , lasix 160mg  daily       Review of Systems  Constitutional: Negative for fever and unexpected weight change.  HENT: Negative for congestion, dental problem, ear  pain, nosebleeds, postnasal drip, rhinorrhea, sinus pressure, sneezing, sore throat and trouble swallowing.   Eyes: Negative for redness and itching.  Respiratory: Positive for cough and shortness of breath. Negative for chest tightness and wheezing.   Cardiovascular: Positive for leg swelling. Negative for palpitations.  Gastrointestinal: Negative for nausea and vomiting.  Genitourinary: Negative for dysuria.  Musculoskeletal: Negative for joint swelling.  Skin: Negative for rash.  Neurological: Negative for headaches.  Hematological: Does not bruise/bleed easily.  Psychiatric/Behavioral: Negative for dysphoric  mood. The patient is not nervous/anxious.       Objective:   Physical Exam  GEN: A/Ox3; pleasant , NAD, chronically ill appearing , in wheelchair   HEENT:  [place]/AT,  EACs-clear, TMs-wnl, NOSE-clear, THROAT-clear, no lesions, no postnasal drip or exudate noted.   NECK:  Supple w/ fair ROM; no JVD; normal carotid impulses w/o bruits; no thyromegaly or nodules palpated; no lymphadenopathy.  RESP  Decreased BS in bases .no accessory muscle use, no dullness to percussion  CARD:  RRR, no m/r/g  , 2+ peripheral edema, pulses intact, no cyanosis or clubbing.  GI:   Soft & nt; nml bowel sounds; no organomegaly or masses detected.  Musco: Warm bil, no deformities or joint swelling noted.   Neuro: alert, no focal deficits noted.    Skin: Warm, no lesions or rashes

## 2013-12-08 IMAGING — CR DG CHEST 2V
2 series · 2 of 2 positions shown · non-contrast
Comparison: Two-view chest x-ray 06/05/2012.  CT chest 06/05/2012.

CLINICAL DATA: Shortness of breath

CHEST - 2 VIEW

[w chest pa]
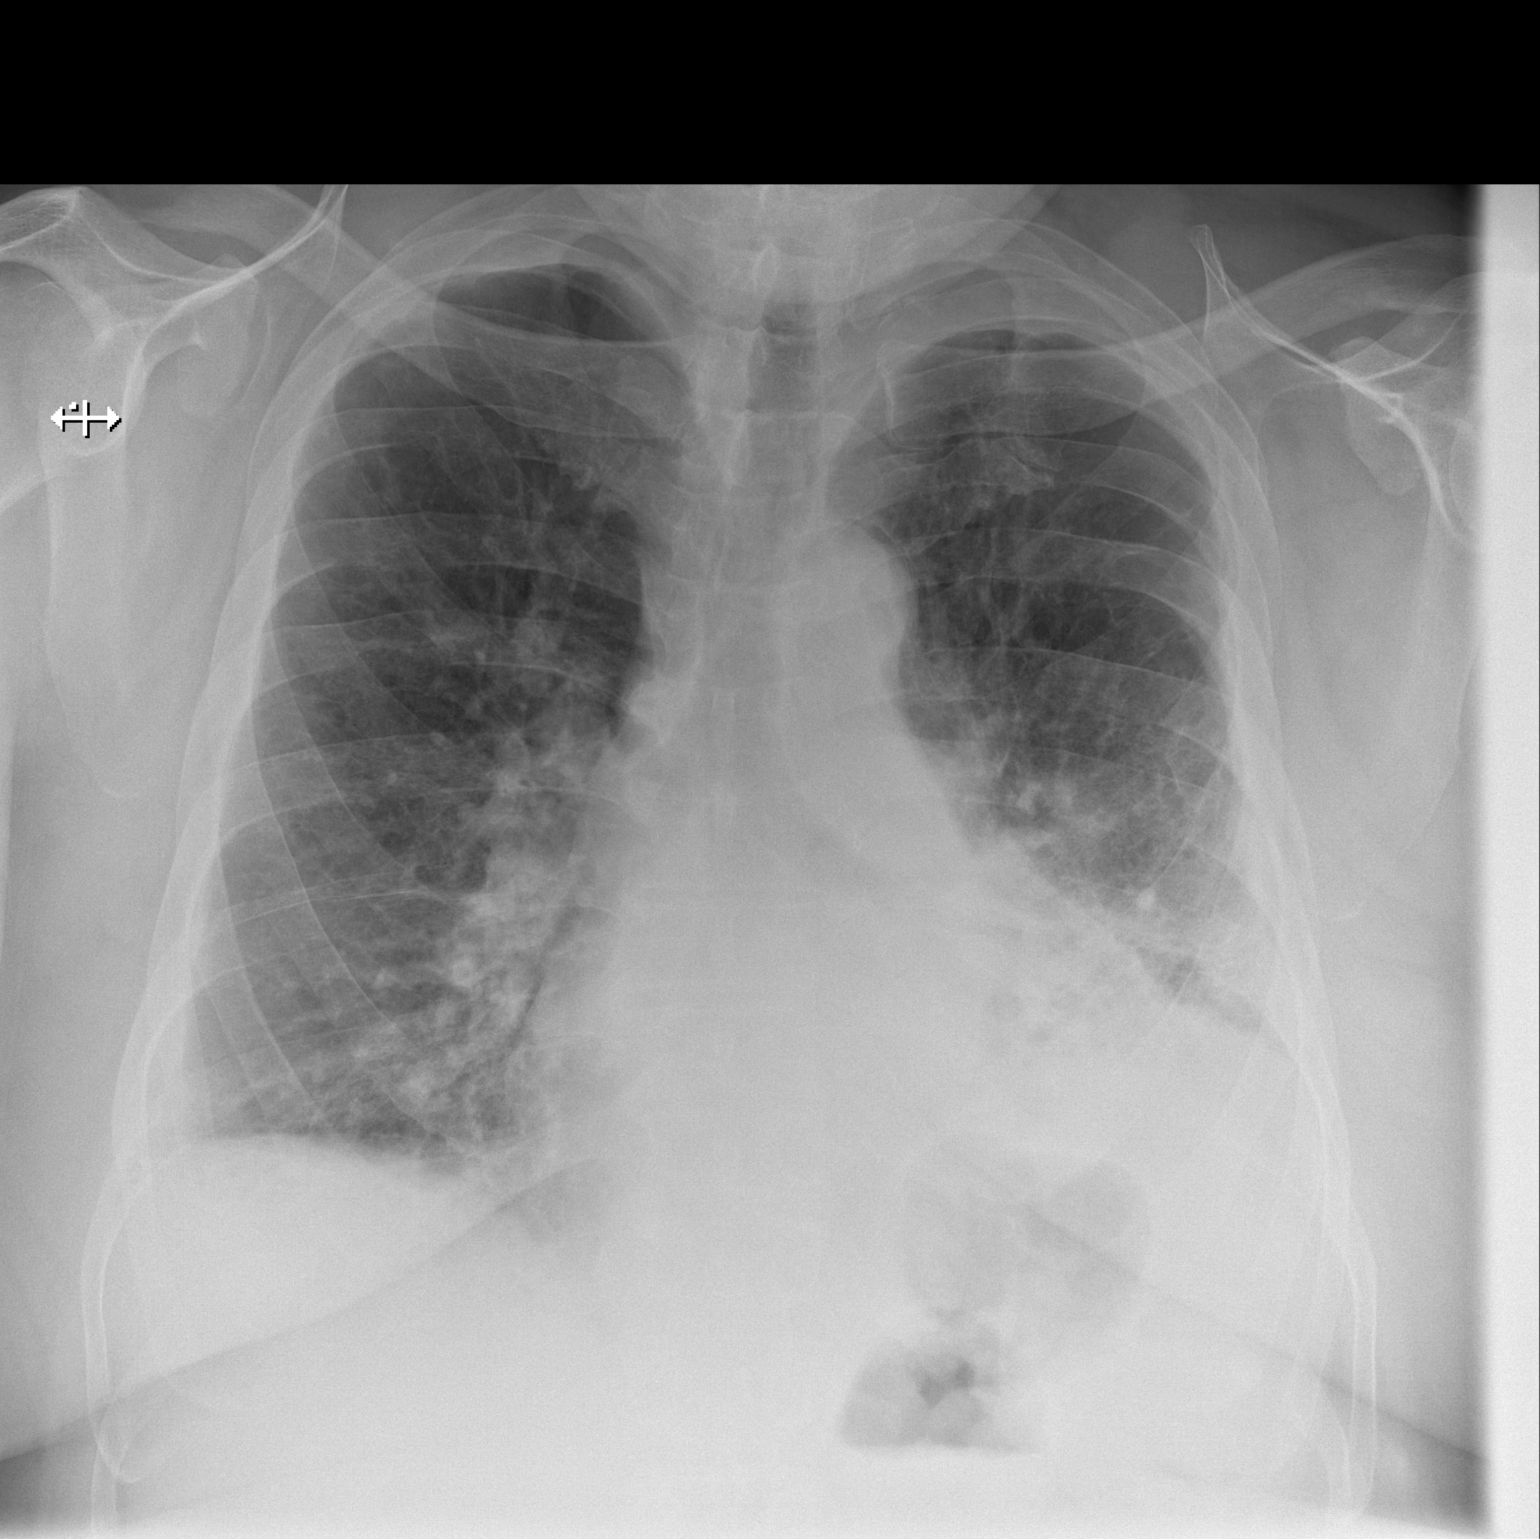

[w chest lat]
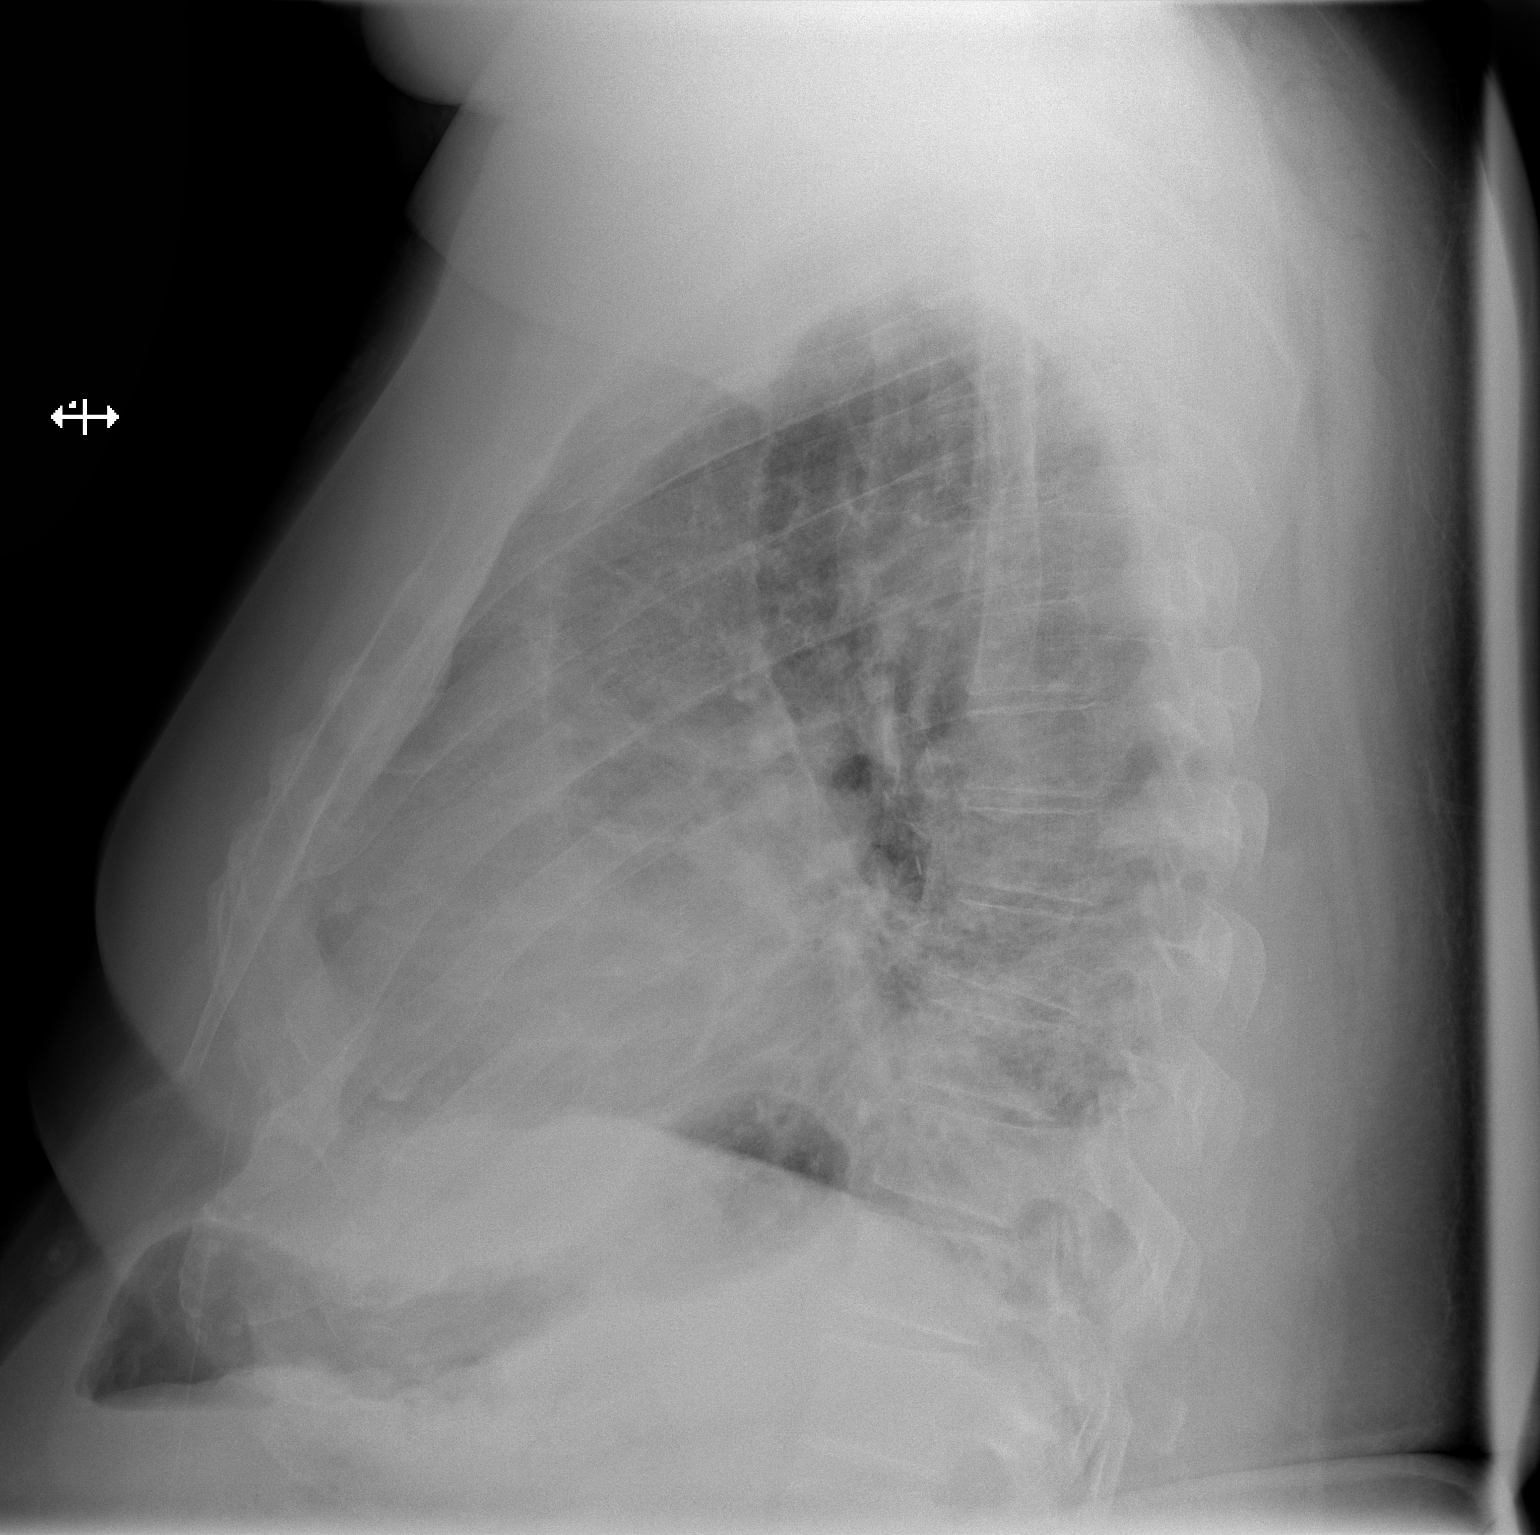

[2 of 2 positions shown; findings below may reference images not displayed]

FINDINGS: The heart is enlarged.  Chronic fibrotic changes are
again noted in the lungs.  Superimposed left lower lobe airspace
disease has progressed.  Small bilateral pleural effusions are
suspected.
IMPRESSION: 1.  Stable cardiomegaly and mild pulmonary vascular congestion.
2.  Progressive left lower lobe airspace disease is concerning for
pneumonia.

## 2013-12-15 ENCOUNTER — Encounter (HOSPITAL_COMMUNITY): Payer: Self-pay

## 2013-12-15 ENCOUNTER — Other Ambulatory Visit: Payer: Self-pay

## 2013-12-15 ENCOUNTER — Other Ambulatory Visit: Payer: Medicare HMO

## 2013-12-15 ENCOUNTER — Encounter (HOSPITAL_COMMUNITY): Payer: Self-pay | Admitting: *Deleted

## 2013-12-15 ENCOUNTER — Inpatient Hospital Stay (HOSPITAL_COMMUNITY)
Admission: EM | Admit: 2013-12-15 | Discharge: 2013-12-20 | DRG: 291 | Disposition: A | Discharge status: Home / Self Care | Payer: Medicare HMO | Attending: Cardiology | Admitting: Cardiology

## 2013-12-15 ENCOUNTER — Ambulatory Visit (HOSPITAL_COMMUNITY)
Admission: RE | Admit: 2013-12-15 | Discharge: 2013-12-15 | Disposition: A | Discharge status: Home / Self Care | Payer: Medicare HMO | Source: Ambulatory Visit | Attending: Internal Medicine | Admitting: Internal Medicine

## 2013-12-15 ENCOUNTER — Emergency Department (HOSPITAL_COMMUNITY): Payer: Medicare HMO

## 2013-12-15 ENCOUNTER — Other Ambulatory Visit (HOSPITAL_BASED_OUTPATIENT_CLINIC_OR_DEPARTMENT_OTHER): Payer: Medicare HMO

## 2013-12-15 DIAGNOSIS — E875 Hyperkalemia: Secondary | ICD-10-CM | POA: Diagnosis present

## 2013-12-15 DIAGNOSIS — Z87891 Personal history of nicotine dependence: Secondary | ICD-10-CM | POA: Diagnosis not present

## 2013-12-15 DIAGNOSIS — E780 Pure hypercholesterolemia: Secondary | ICD-10-CM | POA: Diagnosis present

## 2013-12-15 DIAGNOSIS — I5023 Acute on chronic systolic (congestive) heart failure: Principal | ICD-10-CM | POA: Diagnosis present

## 2013-12-15 DIAGNOSIS — J9621 Acute and chronic respiratory failure with hypoxia: Secondary | ICD-10-CM | POA: Diagnosis present

## 2013-12-15 DIAGNOSIS — M1990 Unspecified osteoarthritis, unspecified site: Secondary | ICD-10-CM | POA: Diagnosis present

## 2013-12-15 DIAGNOSIS — G4733 Obstructive sleep apnea (adult) (pediatric): Secondary | ICD-10-CM | POA: Diagnosis present

## 2013-12-15 DIAGNOSIS — Z6841 Body Mass Index (BMI) 40.0 and over, adult: Secondary | ICD-10-CM | POA: Diagnosis not present

## 2013-12-15 DIAGNOSIS — I5032 Chronic diastolic (congestive) heart failure: Secondary | ICD-10-CM

## 2013-12-15 DIAGNOSIS — Z79899 Other long term (current) drug therapy: Secondary | ICD-10-CM | POA: Diagnosis not present

## 2013-12-15 DIAGNOSIS — C3410 Malignant neoplasm of upper lobe, unspecified bronchus or lung: Secondary | ICD-10-CM

## 2013-12-15 DIAGNOSIS — R0602 Shortness of breath: Secondary | ICD-10-CM | POA: Diagnosis present

## 2013-12-15 DIAGNOSIS — I272 Other secondary pulmonary hypertension: Secondary | ICD-10-CM | POA: Diagnosis present

## 2013-12-15 DIAGNOSIS — N182 Chronic kidney disease, stage 2 (mild): Secondary | ICD-10-CM | POA: Diagnosis present

## 2013-12-15 DIAGNOSIS — E785 Hyperlipidemia, unspecified: Secondary | ICD-10-CM | POA: Diagnosis present

## 2013-12-15 DIAGNOSIS — I5021 Acute systolic (congestive) heart failure: Secondary | ICD-10-CM | POA: Diagnosis present

## 2013-12-15 DIAGNOSIS — Z85118 Personal history of other malignant neoplasm of bronchus and lung: Secondary | ICD-10-CM

## 2013-12-15 DIAGNOSIS — E662 Morbid (severe) obesity with alveolar hypoventilation: Secondary | ICD-10-CM | POA: Diagnosis present

## 2013-12-15 DIAGNOSIS — I129 Hypertensive chronic kidney disease with stage 1 through stage 4 chronic kidney disease, or unspecified chronic kidney disease: Secondary | ICD-10-CM | POA: Diagnosis present

## 2013-12-15 DIAGNOSIS — I872 Venous insufficiency (chronic) (peripheral): Secondary | ICD-10-CM | POA: Diagnosis present

## 2013-12-15 DIAGNOSIS — J441 Chronic obstructive pulmonary disease with (acute) exacerbation: Secondary | ICD-10-CM | POA: Diagnosis present

## 2013-12-15 DIAGNOSIS — E7439 Other disorders of intestinal carbohydrate absorption: Secondary | ICD-10-CM | POA: Diagnosis present

## 2013-12-15 DIAGNOSIS — Z7982 Long term (current) use of aspirin: Secondary | ICD-10-CM

## 2013-12-15 DIAGNOSIS — C3412 Malignant neoplasm of upper lobe, left bronchus or lung: Secondary | ICD-10-CM

## 2013-12-15 HISTORY — DX: Chronic obstructive pulmonary disease, unspecified: J44.9

## 2013-12-15 LAB — CBC WITH DIFFERENTIAL/PLATELET
BASO%: 0.4 % (ref 0.0–2.0)
BASOS ABS: 0 10*3/uL (ref 0.0–0.1)
EOS ABS: 0.1 10*3/uL (ref 0.0–0.5)
EOS%: 2 % (ref 0.0–7.0)
HCT: 38.3 % — ABNORMAL LOW (ref 38.4–49.9)
HGB: 11.7 g/dL — ABNORMAL LOW (ref 13.0–17.1)
LYMPH%: 16.9 % (ref 14.0–49.0)
MCH: 30.5 pg (ref 27.2–33.4)
MCHC: 30.4 g/dL — ABNORMAL LOW (ref 32.0–36.0)
MCV: 100.1 fL — ABNORMAL HIGH (ref 79.3–98.0)
MONO#: 0.5 10*3/uL (ref 0.1–0.9)
MONO%: 12.2 % (ref 0.0–14.0)
NEUT%: 68.5 % (ref 39.0–75.0)
NEUTROS ABS: 2.8 10*3/uL (ref 1.5–6.5)
Platelets: 178 10*3/uL (ref 140–400)
RBC: 3.83 10*6/uL — ABNORMAL LOW (ref 4.20–5.82)
RDW: 18 % — ABNORMAL HIGH (ref 11.0–14.6)
WBC: 4.1 10*3/uL (ref 4.0–10.3)
lymph#: 0.7 10*3/uL — ABNORMAL LOW (ref 0.9–3.3)

## 2013-12-15 LAB — PRO B NATRIURETIC PEPTIDE: PRO B NATRI PEPTIDE: 2468 pg/mL — AB (ref 0–125)

## 2013-12-15 LAB — BASIC METABOLIC PANEL
Anion gap: 11 (ref 5–15)
BUN: 18 mg/dL (ref 6–23)
CO2: 24 mEq/L (ref 19–32)
Calcium: 8.9 mg/dL (ref 8.4–10.5)
Chloride: 106 mEq/L (ref 96–112)
Creatinine, Ser: 1.28 mg/dL (ref 0.50–1.35)
GFR, EST AFRICAN AMERICAN: 65 mL/min — AB (ref >90)
GFR, EST NON AFRICAN AMERICAN: 56 mL/min — AB (ref >90)
GLUCOSE: 93 mg/dL (ref 70–99)
POTASSIUM: 4.9 meq/L (ref 3.7–5.3)
Sodium: 141 mEq/L (ref 137–147)

## 2013-12-15 LAB — CBC
HCT: 37.3 % — ABNORMAL LOW (ref 39.0–52.0)
Hemoglobin: 11.4 g/dL — ABNORMAL LOW (ref 13.0–17.0)
MCH: 31.4 pg (ref 26.0–34.0)
MCHC: 30.6 g/dL (ref 30.0–36.0)
MCV: 102.8 fL — AB (ref 78.0–100.0)
PLATELETS: 168 10*3/uL (ref 150–400)
RBC: 3.63 MIL/uL — ABNORMAL LOW (ref 4.22–5.81)
RDW: 17 % — ABNORMAL HIGH (ref 11.5–15.5)
WBC: 3.9 10*3/uL — AB (ref 4.0–10.5)

## 2013-12-15 LAB — COMPREHENSIVE METABOLIC PANEL (CC13)
ALBUMIN: 3.5 g/dL (ref 3.5–5.0)
ALK PHOS: 71 U/L (ref 40–150)
ALT: 10 U/L (ref 0–55)
ANION GAP: 6 meq/L (ref 3–11)
AST: 20 U/L (ref 5–34)
BILIRUBIN TOTAL: 0.85 mg/dL (ref 0.20–1.20)
BUN: 17.9 mg/dL (ref 7.0–26.0)
CO2: 25 meq/L (ref 22–29)
Calcium: 9.2 mg/dL (ref 8.4–10.4)
Chloride: 110 mEq/L — ABNORMAL HIGH (ref 98–109)
Creatinine: 1.4 mg/dL — ABNORMAL HIGH (ref 0.7–1.3)
Glucose: 103 mg/dl (ref 70–140)
POTASSIUM: 4.8 meq/L (ref 3.5–5.1)
SODIUM: 141 meq/L (ref 136–145)
Total Protein: 7.4 g/dL (ref 6.4–8.3)

## 2013-12-15 LAB — I-STAT TROPONIN, ED: Troponin i, poc: 0.02 ng/mL (ref 0.00–0.08)

## 2013-12-15 MED ORDER — ALBUTEROL SULFATE (2.5 MG/3ML) 0.083% IN NEBU: 2.5000 mg | INHALATION_SOLUTION | RESPIRATORY_TRACT | Status: DC | PRN

## 2013-12-15 MED ORDER — SODIUM CHLORIDE 0.9 % IJ SOLN: 3.0000 mL | INTRAMUSCULAR | Status: DC | PRN

## 2013-12-15 MED ORDER — ONDANSETRON HCL 4 MG/2ML IJ SOLN: 4.0000 mg | Freq: Four times a day (QID) | INTRAMUSCULAR | Status: DC | PRN

## 2013-12-15 MED ORDER — DEXTROSE 5 % IV SOLN
500.0000 mg | INTRAVENOUS | Status: DC
  Administered 2013-12-15 – 2013-12-16 (×2): 500 mg via INTRAVENOUS
  Filled 2013-12-15 (×3): qty 500

## 2013-12-15 MED ORDER — SODIUM CHLORIDE 0.9 % IV SOLN: 250.0000 mL | INTRAVENOUS | Status: DC | PRN

## 2013-12-15 MED ORDER — FUROSEMIDE 10 MG/ML IJ SOLN
INTRAMUSCULAR | Status: AC
  Administered 2013-12-15: 60 mg via INTRAVENOUS
  Filled 2013-12-15: qty 8

## 2013-12-15 MED ORDER — ACETAMINOPHEN 325 MG PO TABS: 650.0000 mg | ORAL_TABLET | ORAL | Status: DC | PRN

## 2013-12-15 MED ORDER — IOHEXOL 300 MG/ML  SOLN
80.0000 mL | Freq: Once | INTRAMUSCULAR | Status: AC | PRN
  Administered 2013-12-15: 80 mL via INTRAVENOUS

## 2013-12-15 MED ORDER — IPRATROPIUM BROMIDE 0.02 % IN SOLN
0.5000 mg | Freq: Once | RESPIRATORY_TRACT | Status: AC
Start: 2013-12-15 — End: 2013-12-15
  Administered 2013-12-15: 0.5 mg via RESPIRATORY_TRACT
  Filled 2013-12-15: qty 2.5

## 2013-12-15 MED ORDER — HEPARIN SODIUM (PORCINE) 5000 UNIT/ML IJ SOLN
5000.0000 [IU] | Freq: Three times a day (TID) | INTRAMUSCULAR | Status: DC
  Administered 2013-12-15 – 2013-12-20 (×14): 5000 [IU] via SUBCUTANEOUS
  Filled 2013-12-15 (×18): qty 1

## 2013-12-15 MED ORDER — TIOTROPIUM BROMIDE MONOHYDRATE 18 MCG IN CAPS
18.0000 ug | ORAL_CAPSULE | Freq: Every day | RESPIRATORY_TRACT | Status: DC
  Administered 2013-12-16 – 2013-12-20 (×5): 18 ug via RESPIRATORY_TRACT
  Filled 2013-12-15: qty 5

## 2013-12-15 MED ORDER — CEFTRIAXONE SODIUM IN DEXTROSE 20 MG/ML IV SOLN
1.0000 g | INTRAVENOUS | Status: DC
  Administered 2013-12-15 – 2013-12-17 (×3): 1 g via INTRAVENOUS
  Filled 2013-12-15 (×4): qty 50

## 2013-12-15 MED ORDER — METHYLPREDNISOLONE SODIUM SUCC 125 MG IJ SOLR
125.0000 mg | Freq: Four times a day (QID) | INTRAMUSCULAR | Status: DC
  Administered 2013-12-15 – 2013-12-16 (×4): 125 mg via INTRAVENOUS
  Filled 2013-12-15 (×8): qty 2

## 2013-12-15 MED ORDER — DIGOXIN 0.0625 MG HALF TABLET
0.0625 mg | ORAL_TABLET | Freq: Every day | ORAL | Status: DC
Start: 2013-12-16 — End: 2013-12-20
  Administered 2013-12-16 – 2013-12-20 (×5): 0.0625 mg via ORAL
  Filled 2013-12-15 (×5): qty 1

## 2013-12-15 MED ORDER — POTASSIUM CHLORIDE CRYS ER 20 MEQ PO TBCR
20.0000 meq | EXTENDED_RELEASE_TABLET | Freq: Two times a day (BID) | ORAL | Status: DC
  Administered 2013-12-15 – 2013-12-16 (×3): 20 meq via ORAL
  Filled 2013-12-15 (×5): qty 1

## 2013-12-15 MED ORDER — LEVALBUTEROL HCL 1.25 MG/0.5ML IN NEBU
1.2500 mg | INHALATION_SOLUTION | Freq: Three times a day (TID) | RESPIRATORY_TRACT | Status: DC
  Administered 2013-12-15 – 2013-12-20 (×14): 1.25 mg via RESPIRATORY_TRACT
  Filled 2013-12-15 (×18): qty 0.5

## 2013-12-15 MED ORDER — METHYLPREDNISOLONE SODIUM SUCC 125 MG IJ SOLR
125.0000 mg | Freq: Once | INTRAMUSCULAR | Status: AC
  Administered 2013-12-15: 125 mg via INTRAVENOUS
  Filled 2013-12-15: qty 2

## 2013-12-15 MED ORDER — METOPROLOL TARTRATE 12.5 MG HALF TABLET
12.5000 mg | ORAL_TABLET | Freq: Two times a day (BID) | ORAL | Status: DC
  Administered 2013-12-15 – 2013-12-20 (×10): 12.5 mg via ORAL
  Filled 2013-12-15 (×11): qty 1

## 2013-12-15 MED ORDER — LOSARTAN POTASSIUM 25 MG PO TABS
25.0000 mg | ORAL_TABLET | Freq: Every day | ORAL | Status: DC
  Administered 2013-12-16 – 2013-12-20 (×5): 25 mg via ORAL
  Filled 2013-12-15 (×5): qty 1

## 2013-12-15 MED ORDER — SODIUM CHLORIDE 0.9 % IJ SOLN
3.0000 mL | Freq: Two times a day (BID) | INTRAMUSCULAR | Status: DC
  Administered 2013-12-15 – 2013-12-20 (×10): 3 mL via INTRAVENOUS

## 2013-12-15 MED ORDER — FUROSEMIDE 10 MG/ML IJ SOLN
60.0000 mg | Freq: Two times a day (BID) | INTRAMUSCULAR | Status: DC
  Administered 2013-12-15 – 2013-12-16 (×3): 60 mg via INTRAVENOUS
  Filled 2013-12-15 (×4): qty 6

## 2013-12-15 MED ORDER — ALBUTEROL SULFATE (2.5 MG/3ML) 0.083% IN NEBU
5.0000 mg | INHALATION_SOLUTION | Freq: Once | RESPIRATORY_TRACT | Status: AC
  Administered 2013-12-15: 5 mg via RESPIRATORY_TRACT
  Filled 2013-12-15: qty 6
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
## 2013-12-15 NOTE — ED Notes (Signed)
Pt reports having sob for over a year but has become progressively worse. Having wheezing and swelling to extremities. spo2 76% room air at triage.
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
## 2013-12-16 ENCOUNTER — Encounter (HOSPITAL_COMMUNITY): Payer: Self-pay | Admitting: General Practice

## 2013-12-16 LAB — BASIC METABOLIC PANEL
Anion gap: 14 (ref 5–15)
BUN: 18 mg/dL (ref 6–23)
CHLORIDE: 102 meq/L (ref 96–112)
CO2: 22 meq/L (ref 19–32)
Calcium: 9.2 mg/dL (ref 8.4–10.5)
Creatinine, Ser: 1.2 mg/dL (ref 0.50–1.35)
GFR calc Af Amer: 70 mL/min — ABNORMAL LOW (ref >90)
GFR calc non Af Amer: 60 mL/min — ABNORMAL LOW (ref >90)
GLUCOSE: 175 mg/dL — AB (ref 70–99)
Potassium: 4.7 mEq/L (ref 3.7–5.3)
Sodium: 138 mEq/L (ref 137–147)

## 2013-12-16 MED ORDER — METHYLPREDNISOLONE SODIUM SUCC 125 MG IJ SOLR
60.0000 mg | Freq: Four times a day (QID) | INTRAMUSCULAR | Status: DC
  Administered 2013-12-16 – 2013-12-17 (×3): 60 mg via INTRAVENOUS
  Filled 2013-12-16 (×7): qty 0.96

## 2013-12-16 MED ORDER — FUROSEMIDE 10 MG/ML IJ SOLN
INTRAMUSCULAR | Status: AC
  Filled 2013-12-16: qty 8

## 2013-12-16 NOTE — Progress Notes (Signed)
  Echocardiogram 2D Echocardiogram has been performed.  Marsden Zaino 12/16/2013, 9:30 AM

## 2013-12-16 NOTE — Care Management Note (Unsigned)
    Page 1 of 1   12/18/2013     4:46:30 PM CARE MANAGEMENT NOTE 12/18/2013  Patient:  [name] [name]   Account Number:  [id number]  Date Initiated:  12/16/2013  Documentation initiated by:  Havannah Streat  Subjective/Objective Assessment:   Pt adm on 12/15/13 with COPD exacerbation.  PTA, pt independent, lives alone.  He is on chronic home oxygen--he does not know the name of O2 carrier.     Action/Plan:   Will follow for dc needs as pt progresses.  Pt states son can assist, if needed, at dc.   Anticipated DC Date:  12/18/2013   Anticipated DC Plan:  [place]  CM consult      Choice offered to / List presented to:             Status of service:  In process, will continue to follow Medicare Important Message given?  YES (If response is "NO", the following Medicare IM given date fields will be blank) Date Medicare IM given:  12/18/2013 Medicare IM given by:  Shira Bobst Date Additional Medicare IM given:   Additional Medicare IM given by:    Discharge Disposition:    Per UR Regulation:  Reviewed for med. necessity/level of care/duration of stay  If discussed at [place] of Stay Meetings, dates discussed:    Comments:

## 2013-12-16 NOTE — Progress Notes (Signed)
Subjective:  Patient denies any chest pain states breathing and coughing and leg swelling has improved overall feels better  Objective:  Vital Signs in the last 24 hours: Temp:  [97.9 F (36.6 C)-98 F (36.7 C)] 98 F (36.7 C) (11/17 0527) Pulse Rate:  [67-83] 83 (11/17 1014) Resp:  [18-22] 18 (11/17 0527) BP: (122-146)/(69-81) 146/76 mmHg (11/17 1014) SpO2:  [92 %-94 %] 94 % (11/17 0804) Weight:  [147.963 kg (326 lb 3.2 oz)-150.549 kg (331 lb 14.4 oz)] 147.963 kg (326 lb 3.2 oz) (11/17 0527)  Intake/Output from previous day: 11/16 0701 - 11/17 0700 In: 1135 [P.O.:835; IV Piggyback:300] Out: 1350 [Urine:1350] Intake/Output from this shift: Total I/O In: 480 [P.O.:480] Out: 600 [Urine:600]  Physical Exam: Neck: no adenopathy, no carotid bruit, no JVD and supple, symmetrical, trachea midline Lungs: decreased breath sounds at bases with occasional rhonchi , Rales and faint expiratory wheezing air entry improved Heart: regular rate and rhythm, S1: soft systolic murmur noted and S3 gallop noted Abdomen: soft, non-tender; bowel sounds normal; no masses,  no organomegaly Extremities: no clubbing cyanosis 3+ edema noted  Lab Results:  Recent Labs  12/15/13 0828 12/15/13 1130  WBC 4.1 3.9*  HGB 11.7* 11.4*  PLT 178 168    Recent Labs  12/15/13 1130 12/16/13 0541  NA 141 138  K 4.9 4.7  CL 106 102  CO2 24 22  GLUCOSE 93 175*  BUN 18 18  CREATININE 1.28 1.20   No results for input(s): TROPONINI in the last 72 hours.  Invalid input(s): CK, MB Hepatic Function Panel  Recent Labs  12/15/13 0828  PROT 7.4  ALBUMIN 3.5  AST 20  ALT 10  ALKPHOS 71  BILITOT 0.85   No results for input(s): CHOL in the last 72 hours. No results for input(s): PROTIME in the last 72 hours.  Imaging: Imaging results have been reviewed and Ct Chest W Contrast  12/15/2013   CLINICAL DATA:  Lung cancer. History of wedge resection and chemotherapy. Cough wheezing and shortness of  Breath.  EXAM: CT CHEST WITH CONTRAST  TECHNIQUE: Multidetector CT imaging of the chest was performed during intravenous contrast administration.  CONTRAST:  82mL OMNIPAQUE IOHEXOL 300 MG/ML  SOLN  COMPARISON:  09/12/2013 Chest CT and 06/12/2013 PET-CT  FINDINGS: Chest wall: The left-sided Port-A-Cath is stable. No supraclavicular or axillary lymphadenopathy. Small scattered lymph nodes are stable. The thyroid gland is grossly normal. The bony thorax is intact. No destructive bone lesions or spinal canal compromise.  Mediastinum: The heart is mildly enlarged but stable. New small pericardial effusion. Stable three-vessel coronary artery calcifications. The aorta is normal in caliber. Stable scattered atherosclerotic calcifications. No dissection. The pulmonary arteries are mildly enlarged. The esophagus is grossly normal. There are persistent borderline enlarged mediastinal and hilar lymph nodes. A prevascular lymph node on image number 24 measures 11 mm and previously measured 9.5 mm. A 15 mm aorticopulmonary node on image number 22 previously measured 13 mm. A right paratracheal node on image number 15 measures 13 mm and previously measured 14 mm.  Lungs: Severe emphysematous changes are again demonstrated with apical bulla. There are bilateral pleural effusions and overlying atelectasis. The right lower lobe subpleural density identified on the prior examination is difficult to evaluate because of the pleural effusion and atelectasis. It is somewhat obscured but may be slightly larger. It does not have masslike characteristics as there are few air bronchograms and may be rounded atelectasis. Recommend continued surveillance.  Upper abdomen: There is free  abdominal fluid and there appears to be inflammation or fluid around the pancreas. Could not exclude acute pancreatitis. Recommend correlation with clinical findings, amylase and lipase levels. No adrenal gland lesions are identified. No obvious liver lesions.  Diffuse body wall edema is noted and could represent anasarca.  IMPRESSION: 1. Overall relatively stable mediastinal lymph nodes as discussed above. 2. The right lower lobe subpleural pulmonary lesion is slightly larger but somewhat obscured by pleural fluid and atelectasis. This does not have masslike qualities and may be rounded atelectasis. Recommend continued surveillance. 3. Stable emphysematous changes with enlarging pleural effusions and increasing bibasilar atelectasis. Stable bilateral lower lobe bronchiectasis, left greater than right. 4. Fluid noted in the upper abdomen with poor definition of the pancreatic head. Could not exclude pancreatitis. Recommend clinical correlation. There is also body wall edema and all of this could be due to anasarca.   Electronically Signed   By: Kalman Jewels M.D.   On: 12/15/2013 11:09    Cardiac Studies:  Assessment/Plan:  Acute on chronic hypoxic respiratory failure Exacerbation of COPD Mild decompensated systolic heart failure Hypertension Glucose intolerance Obstructive sleep apnea/obesity hypoventilation syndrome Pulmonary hypertension Morbid obesity Hypercholesteremia Chronic kidney disease stage II History of non-small cell CA of the left lung status post lobectomy in the past Remote tobacco abuse Plan Reduce Solu-Medrol as per orders Check labs in a.m.  LOS: 1 day    [name] N 12/16/2013, 3:16 PM

## 2013-12-17 LAB — CBC
HCT: 36.9 % — ABNORMAL LOW (ref 39.0–52.0)
HEMOGLOBIN: 11.1 g/dL — AB (ref 13.0–17.0)
MCH: 30.6 pg (ref 26.0–34.0)
MCHC: 30.1 g/dL (ref 30.0–36.0)
MCV: 101.7 fL — AB (ref 78.0–100.0)
PLATELETS: 161 10*3/uL (ref 150–400)
RBC: 3.63 MIL/uL — ABNORMAL LOW (ref 4.22–5.81)
RDW: 17.2 % — ABNORMAL HIGH (ref 11.5–15.5)
WBC: 4.5 10*3/uL (ref 4.0–10.5)

## 2013-12-17 LAB — BASIC METABOLIC PANEL
Anion gap: 14 (ref 5–15)
BUN: 27 mg/dL — AB (ref 6–23)
CHLORIDE: 105 meq/L (ref 96–112)
CO2: 22 mEq/L (ref 19–32)
Calcium: 8.9 mg/dL (ref 8.4–10.5)
Creatinine, Ser: 1.29 mg/dL (ref 0.50–1.35)
GFR calc non Af Amer: 55 mL/min — ABNORMAL LOW (ref >90)
GFR, EST AFRICAN AMERICAN: 64 mL/min — AB (ref >90)
GLUCOSE: 140 mg/dL — AB (ref 70–99)
Potassium: 5.7 mEq/L — ABNORMAL HIGH (ref 3.7–5.3)
SODIUM: 141 meq/L (ref 137–147)

## 2013-12-17 LAB — PRO B NATRIURETIC PEPTIDE: PRO B NATRI PEPTIDE: 2944 pg/mL — AB (ref 0–125)

## 2013-12-17 MED ORDER — METHYLPREDNISOLONE SODIUM SUCC 40 MG IJ SOLR
40.0000 mg | Freq: Four times a day (QID) | INTRAMUSCULAR | Status: DC
  Administered 2013-12-17 – 2013-12-18 (×3): 40 mg via INTRAVENOUS
  Filled 2013-12-17 (×7): qty 1

## 2013-12-17 MED ORDER — AZITHROMYCIN 500 MG PO TABS
500.0000 mg | ORAL_TABLET | Freq: Every day | ORAL | Status: DC
  Administered 2013-12-17: 500 mg via ORAL
  Filled 2013-12-17 (×2): qty 1

## 2013-12-17 MED ORDER — FUROSEMIDE 10 MG/ML IJ SOLN
60.0000 mg | Freq: Two times a day (BID) | INTRAMUSCULAR | Status: DC
  Administered 2013-12-17 – 2013-12-18 (×3): 60 mg via INTRAVENOUS
  Filled 2013-12-17 (×2): qty 6

## 2013-12-17 NOTE — Plan of Care (Signed)
Problem: Phase I Progression Outcomes Goal: Up in chair, BRP Outcome: Completed/Met Date Met:  12/17/13 Up in recliner.

## 2013-12-17 NOTE — Progress Notes (Signed)
Nutrition Education Note  RD consulted for nutrition education. Pt requesting education regarding low sodium diet for CHF. He reports he has received diet education in the past and knows what to avoid but, he falls off track often.   RD provided "Low Sodium Nutrition Therapy" handout from the Academy of [employer]. Reviewed patient's dietary recall. Provided examples on ways to decrease sodium intake in diet. Discouraged intake of processed foods and use of salt shaker. Encouraged fresh fruits and vegetables as well as whole grain sources of carbohydrates to maximize fiber intake. Provided samples of low sodium meals and discussed tips for eating out.    RD discussed why it is important for patient to adhere to diet recommendations, and emphasized the role of fluids, foods to avoid, and importance of weighing self daily. Teach back method used.  Expect good compliance.  Body mass index is 45.07 kg/(m^2). Pt meets criteria for Morbid Obesity based on current BMI.  Current diet order is Heart Healthy, patient is consuming approximately 75-100% of meals at this time. Labs and medications reviewed. No further nutrition interventions warranted at this time. RD contact information provided. If additional nutrition issues arise, please re-consult RD.   Pryor Ochoa RD, LDN Inpatient Clinical Dietitian Pager: [phone] After Hours Pager: [phone]

## 2013-12-17 NOTE — Progress Notes (Signed)
Subjective:  Patient denies any chest pain states breathing is improving gradually. He has lost approximately 7 pounds in last 2 days  Objective:  Vital Signs in the last 24 hours: Temp:  [97.8 F (36.6 C)-98 F (36.7 C)] 98 F (36.7 C) (11/18 0604) Pulse Rate:  [68-81] 81 (11/18 1029) Resp:  [18] 18 (11/18 0604) BP: (119-133)/(62-90) 132/62 mmHg (11/18 1029) SpO2:  [92 %-96 %] 92 % (11/18 0913) Weight:  [146.512 kg (323 lb)] 146.512 kg (323 lb) (11/18 0604)  Intake/Output from previous day: 11/17 0701 - 11/18 0700 In: 1260 [P.O.:960; IV Piggyback:300] Out: 2475 [Urine:2475] Intake/Output from this shift: Total I/O In: 360 [P.O.:360] Out: 925 [Urine:925]  Physical Exam: Neck: no adenopathy, no carotid bruit, no JVD and supple, symmetrical, trachea midline Lungs: decreased breath sound at bases air entry improved no significant wheezing noted Heart: regular rate and rhythm, S1, S2 normal and soft systolic murmur and S3 gallop noted Abdomen: soft, non-tender; bowel sounds normal; no masses,  no organomegaly Extremities: no clubbing cyanosis 2+ edema noted left leg more than right  Lab Results:  Recent Labs  12/15/13 1130 12/17/13 0530  WBC 3.9* 4.5  HGB 11.4* 11.1*  PLT 168 161    Recent Labs  12/16/13 0541 12/17/13 0530  NA 138 141  K 4.7 5.7*  CL 102 105  CO2 22 22  GLUCOSE 175* 140*  BUN 18 27*  CREATININE 1.20 1.29   No results for input(s): TROPONINI in the last 72 hours.  Invalid input(s): CK, MB Hepatic Function Panel  Recent Labs  12/15/13 0828  PROT 7.4  ALBUMIN 3.5  AST 20  ALT 10  ALKPHOS 71  BILITOT 0.85   No results for input(s): CHOL in the last 72 hours. No results for input(s): PROTIME in the last 72 hours.  Imaging: Imaging results have been reviewed and No results found.  Cardiac Studies:  Assessment/Plan:  ResolvingAcute on chronic hypoxic respiratory failure Exacerbation of COPD Mild decompensated systolic heart  failure Hypertension Glucose intolerance Obstructive sleep apnea/obesity hypoventilation syndrome Pulmonary hypertension Morbid obesity Hypercholesteremia Chronic kidney disease stage II History of non-small cell CA of the left lung status post lobectomy in the past Remote tobacco abuse hyperkalemia Plan DC K door Reduce Solu-Medrol as per orders Check labs in a.m.   LOS: 2 days    Charizma Gardiner N 12/17/2013, 12:16 PM

## 2013-12-18 ENCOUNTER — Ambulatory Visit: Payer: Medicare HMO | Admitting: Internal Medicine

## 2013-12-18 LAB — CBC
HCT: 37 % — ABNORMAL LOW (ref 39.0–52.0)
Hemoglobin: 11.1 g/dL — ABNORMAL LOW (ref 13.0–17.0)
MCH: 30.3 pg (ref 26.0–34.0)
MCHC: 30 g/dL (ref 30.0–36.0)
MCV: 101.1 fL — ABNORMAL HIGH (ref 78.0–100.0)
Platelets: 166 10*3/uL (ref 150–400)
RBC: 3.66 MIL/uL — AB (ref 4.22–5.81)
RDW: 17.2 % — ABNORMAL HIGH (ref 11.5–15.5)
WBC: 5.4 10*3/uL (ref 4.0–10.5)

## 2013-12-18 LAB — BASIC METABOLIC PANEL
ANION GAP: 14 (ref 5–15)
BUN: 32 mg/dL — ABNORMAL HIGH (ref 6–23)
CHLORIDE: 102 meq/L (ref 96–112)
CO2: 25 meq/L (ref 19–32)
CREATININE: 1.26 mg/dL (ref 0.50–1.35)
Calcium: 8.9 mg/dL (ref 8.4–10.5)
GFR calc non Af Amer: 57 mL/min — ABNORMAL LOW (ref >90)
GFR, EST AFRICAN AMERICAN: 66 mL/min — AB (ref >90)
Glucose, Bld: 137 mg/dL — ABNORMAL HIGH (ref 70–99)
Potassium: 4.6 mEq/L (ref 3.7–5.3)
Sodium: 141 mEq/L (ref 137–147)

## 2013-12-18 LAB — PRO B NATRIURETIC PEPTIDE: PRO B NATRI PEPTIDE: 2665 pg/mL — AB (ref 0–125)

## 2013-12-18 MED ORDER — BUDESONIDE-FORMOTEROL FUMARATE 160-4.5 MCG/ACT IN AERO
2.0000 | INHALATION_SPRAY | Freq: Two times a day (BID) | RESPIRATORY_TRACT | Status: DC
  Administered 2013-12-18 – 2013-12-20 (×5): 2 via RESPIRATORY_TRACT
  Filled 2013-12-18: qty 6

## 2013-12-18 MED ORDER — LEVOFLOXACIN 750 MG PO TABS
750.0000 mg | ORAL_TABLET | Freq: Every day | ORAL | Status: DC
  Administered 2013-12-18 – 2013-12-20 (×3): 750 mg via ORAL
  Filled 2013-12-18 (×3): qty 1

## 2013-12-18 MED ORDER — PREDNISONE 20 MG PO TABS
40.0000 mg | ORAL_TABLET | Freq: Every day | ORAL | Status: AC
  Administered 2013-12-18 – 2013-12-20 (×3): 40 mg via ORAL
  Filled 2013-12-18 (×3): qty 2

## 2013-12-18 MED ORDER — FUROSEMIDE 10 MG/ML IJ SOLN
80.0000 mg | Freq: Two times a day (BID) | INTRAMUSCULAR | Status: DC
  Administered 2013-12-18 – 2013-12-20 (×4): 80 mg via INTRAVENOUS
  Filled 2013-12-18 (×5): qty 8

## 2013-12-18 NOTE — Plan of Care (Signed)
Problem: Phase II Progression Outcomes Goal: Fluid volume status improved Outcome: Completed/Met Date Met:  12/18/13

## 2013-12-18 NOTE — Progress Notes (Signed)
Subjective:  Patient denies any chest pain states breathing is slowly improving.  Objective:  Vital Signs in the last 24 hours: Temp:  [97.3 F (36.3 C)-98.4 F (36.9 C)] 98.1 F (36.7 C) (11/19 0440) Pulse Rate:  [64-81] 64 (11/19 0440) Resp:  [18-20] 18 (11/19 0440) BP: (103-132)/(45-73) 122/73 mmHg (11/19 0440) SpO2:  [91 %-96 %] 91 % (11/19 0859) Weight:  [146.013 kg (321 lb 14.4 oz)] 146.013 kg (321 lb 14.4 oz) (11/19 0440)  Intake/Output from previous day: 11/18 0701 - 11/19 0700 In: 1300 [P.O.:1300] Out: 3600 [Urine:3600] Intake/Output from this shift: Total I/O In: 455 [P.O.:455] Out: -   Physical Exam: Neck: no adenopathy, no carotid bruit, no JVD and supple, symmetrical, trachea midline Lungs: Decrease breath sounds at bases with faint wheezing noted Heart: regular rate and rhythm, S1, S2 normal and Soft systolic murmur and S3 gallop noted Abdomen: soft, non-tender; bowel sounds normal; no masses,  no organomegaly Extremities: No clubbing cyanosis 2+ edema noted left more than right  Lab Results:  Recent Labs  12/17/13 0530 12/18/13 0605  WBC 4.5 5.4  HGB 11.1* 11.1*  PLT 161 166    Recent Labs  12/17/13 0530 12/18/13 0605  NA 141 141  K 5.7* 4.6  CL 105 102  CO2 22 25  GLUCOSE 140* 137*  BUN 27* 32*  CREATININE 1.29 1.26   No results for input(s): TROPONINI in the last 72 hours.  Invalid input(s): CK, MB Hepatic Function Panel No results for input(s): PROT, ALBUMIN, AST, ALT, ALKPHOS, BILITOT, BILIDIR, IBILI in the last 72 hours. No results for input(s): CHOL in the last 72 hours. No results for input(s): PROTIME in the last 72 hours.  Imaging: Imaging results have been reviewed and No results found.  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  LOS: 3 days    [name] N 12/18/2013, 10:23 AM

## 2013-12-18 NOTE — Plan of Care (Signed)
Problem: Phase II Progression Outcomes Goal: Pain controlled Outcome: Completed/Met Date Met:  12/18/13     

## 2013-12-18 NOTE — Plan of Care (Signed)
Problem: Phase II Progression Outcomes Goal: Tolerating diet Outcome: Completed/Met Date Met:  12/18/13     

## 2013-12-18 NOTE — Plan of Care (Signed)
Problem: Phase I Progression Outcomes Goal: EF % per last Echo/documented,Core Reminder form on chart Outcome: Completed/Met Date Met:  12/18/13 45-50%(12-16-13)

## 2013-12-18 NOTE — Plan of Care (Signed)
Problem: Phase I Progression Outcomes Goal: Hemodynamically stable Outcome: Completed/Met Date Met:  12/18/13

## 2013-12-18 NOTE — Plan of Care (Signed)
Problem: Phase II Progression Outcomes Goal: Other Phase II Outcomes/Goals Outcome: Not Applicable Date Met:  60/16/58

## 2013-12-18 NOTE — Plan of Care (Signed)
Problem: Phase I Progression Outcomes Goal: Other Phase I Outcomes/Goals Outcome: Not Applicable Date Met:  12/18/13     

## 2013-12-19 LAB — BASIC METABOLIC PANEL
Anion gap: 14 (ref 5–15)
BUN: 34 mg/dL — AB (ref 6–23)
CHLORIDE: 100 meq/L (ref 96–112)
CO2: 27 meq/L (ref 19–32)
CREATININE: 1.37 mg/dL — AB (ref 0.50–1.35)
Calcium: 8.9 mg/dL (ref 8.4–10.5)
GFR calc Af Amer: 60 mL/min — ABNORMAL LOW (ref >90)
GFR calc non Af Amer: 51 mL/min — ABNORMAL LOW (ref >90)
GLUCOSE: 120 mg/dL — AB (ref 70–99)
POTASSIUM: 4.1 meq/L (ref 3.7–5.3)
Sodium: 141 mEq/L (ref 137–147)

## 2013-12-19 LAB — CBC
HEMATOCRIT: 37.5 % — AB (ref 39.0–52.0)
HEMOGLOBIN: 11.4 g/dL — AB (ref 13.0–17.0)
MCH: 31.1 pg (ref 26.0–34.0)
MCHC: 30.4 g/dL (ref 30.0–36.0)
MCV: 102.2 fL — AB (ref 78.0–100.0)
Platelets: 216 10*3/uL (ref 150–400)
RBC: 3.67 MIL/uL — AB (ref 4.22–5.81)
RDW: 17.1 % — ABNORMAL HIGH (ref 11.5–15.5)
WBC: 4.9 10*3/uL (ref 4.0–10.5)

## 2013-12-19 LAB — PRO B NATRIURETIC PEPTIDE: Pro B Natriuretic peptide (BNP): 3054 pg/mL — ABNORMAL HIGH (ref 0–125)

## 2013-12-19 NOTE — Progress Notes (Signed)
Subjective:  Complains of shortness of breath associated with wheezing. States overall feels better. Denies any chest pain. Urine output adequate. Lost approximately 13 pounds since admission.  Objective:  Vital Signs in the last 24 hours: Temp:  [97.2 F (36.2 C)-97.9 F (36.6 C)] 97.2 F (36.2 C) (11/20 0743) Pulse Rate:  [62-78] 78 (11/20 0743) Resp:  [20] 20 (11/20 0743) BP: (100-128)/(51-70) 124/70 mmHg (11/20 0743) SpO2:  [93 %-98 %] 98 % (11/20 0838) FiO2 (%):  [32 %] 32 % (11/19 1432) Weight:  [143.881 kg (317 lb 3.2 oz)] 143.881 kg (317 lb 3.2 oz) (11/20 0500)  Intake/Output from previous day: 11/19 0701 - 11/20 0700 In: 935 [P.O.:935] Out: 3625 [Urine:3625] Intake/Output from this shift: Total I/O In: 720 [P.O.:720] Out: 1500 [Urine:1500]  Physical Exam: Neck: no adenopathy, no carotid bruit, no JVD and supple, symmetrical, trachea midline Lungs: Decreased breath sound at bases with occasional faint expiratory wheezing Heart: regular rate and rhythm, S1, S2 normal and Soft systolic murmur and S3 gallop Abdomen: soft, non-tender; bowel sounds normal; no masses,  no organomegaly Extremities: No clubbing cyanosis 2+ edema noted left more than right  Lab Results:  Recent Labs  12/18/13 0605 12/19/13 0514  WBC 5.4 4.9  HGB 11.1* 11.4*  PLT 166 216    Recent Labs  12/18/13 0605 12/19/13 0514  NA 141 141  K 4.6 4.1  CL 102 100  CO2 25 27  GLUCOSE 137* 120*  BUN 32* 34*  CREATININE 1.26 1.37*   No results for input(s): TROPONINI in the last 72 hours.  Invalid input(s): CK, MB Hepatic Function Panel No results for input(s): PROT, ALBUMIN, AST, ALT, ALKPHOS, BILITOT, BILIDIR, IBILI in the last 72 hours. No results for input(s): CHOL in the last 72 hours. No results for input(s): PROTIME in the last 72 hours.  Imaging: Imaging results have been reviewed and No results found.  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

## 2013-12-19 NOTE — Progress Notes (Signed)
Received report on [name].  No c/o pain.  Sitting up in chair.  Aware of weight on admission and weight loss to date after receiving lasix medication

## 2013-12-20 MED ORDER — BUDESONIDE-FORMOTEROL FUMARATE 160-4.5 MCG/ACT IN AERO: 2.0000 | INHALATION_SPRAY | Freq: Two times a day (BID) | RESPIRATORY_TRACT | Status: DC

## 2013-12-20 MED ORDER — LEVALBUTEROL TARTRATE 45 MCG/ACT IN AERO
1.0000 | INHALATION_SPRAY | Freq: Four times a day (QID) | RESPIRATORY_TRACT | Status: DC | PRN
Start: 2013-12-20 — End: 2014-10-22

## 2013-12-20 MED ORDER — METOLAZONE 5 MG PO TABS: 5.0000 mg | ORAL_TABLET | Freq: Every day | ORAL | Status: DC

## 2013-12-20 MED ORDER — LEVOFLOXACIN 750 MG PO TABS: 750.0000 mg | ORAL_TABLET | Freq: Every day | ORAL | Status: DC

## 2013-12-20 MED ORDER — METOPROLOL TARTRATE 12.5 MG HALF TABLET: 12.5000 mg | ORAL_TABLET | Freq: Two times a day (BID) | ORAL | Status: DC

## 2013-12-20 NOTE — Plan of Care (Signed)
Problem: Phase III Progression Outcomes Goal: Tolerating diet Outcome: Completed/Met Date Met:  12/20/13

## 2013-12-20 NOTE — Plan of Care (Signed)
Problem: Phase III Progression Outcomes Goal: Other Phase III Outcomes/Goals Outcome: Completed/Met Date Met:  12/20/13

## 2013-12-20 NOTE — Plan of Care (Signed)
Problem: Phase III Progression Outcomes Goal: Discharge plan remains appropriate-arrangements made Outcome: Completed/Met Date Met:  12/20/13

## 2013-12-20 NOTE — Plan of Care (Signed)
Problem: Phase III Progression Outcomes Goal: Fluid volume status improved Outcome: Completed/Met Date Met:  12/20/13     

## 2013-12-20 NOTE — Discharge Instructions (Signed)
Acute Respiratory Failure Respiratory failure is when your lungs are not working well and your breathing (respiratory) system fails. When respiratory failure occurs, it is difficult for your lungs to get enough oxygen, get rid of carbon dioxide, or both. Respiratory failure can be life threatening.  Respiratory failure can be acute or chronic. Acute respiratory failure is sudden, severe, and requires emergency medical treatment. Chronic respiratory failure is less severe, happens over time, and requires ongoing treatment.  WHAT ARE THE CAUSES OF ACUTE RESPIRATORY FAILURE?  Any problem affecting the heart or lungs can cause acute respiratory failure. Some of these causes include the following:  Chronic bronchitis and emphysema (COPD).   Blood clot going to a lung (pulmonary embolism).   Having water in the lungs caused by heart failure, lung injury, or infection (pulmonary edema).   Collapsed lung (pneumothorax).   Pneumonia.   Pulmonary fibrosis.   Obesity.   Asthma.   Heart failure.   Any type of trauma to the chest that can make breathing difficult.   Nerve or muscle diseases making chest movements difficult. WHAT SYMPTOMS SHOULD YOU WATCH FOR?  If you have any of these signs or symptoms, you should seek immediate medical care:   You have shortness of breath (dyspnea) with or without activity.   You have rapid, fast breathing (tachypnea).   You are wheezing.  You are unable to say more than a few words without having to catch your breath.  You find it very difficult to function normally.  You have a fast heart rate.   You have a bluish color to your finger or toe nail beds.   You have confusion or drowsiness or both.  HOW WILL MY ACUTE RESPIRATORY FAILURE BE TREATED?  Treatment of acute respiratory failure depends on the cause of the respiratory failure. Usually, you will stay in the intensive care unit so your breathing can be watched closely. Treatment  can include the following:  Oxygen. Oxygen can be delivered through the following:  Nasal cannula. This is small tubing that goes in your nose to give you oxygen.  Face mask. A face mask covers your nose and mouth to give you oxygen.  Medicine. Different medicines can be given to help with breathing. These can include:  Nebulizers. Nebulizers deliver medicines to open the air passages (bronchodilators). These medicines help to open or relax the airways in the lungs so you can breathe better. They can also help loosen mucus from your lungs.  Diuretics. Diuretic medicines can help you breathe better by getting rid of extra water in your body.  Steroids. Steroid medicines can help decrease swelling (inflammation) in your lungs.  Antibiotics.  Chest tube. If you have a collapsed lung (pneumothorax), a chest tube is placed to help reinflate the lung.  Non-invasive positive pressure ventilation (NPPV). This is a tight-fitting mask that goes over your nose and mouth. The mask has tubing that is attached to a machine. The machine blows air into the tubing, which helps to keep the tiny air sacs (alveoli) in your lungs open. This machine allows you to breathe on your own.  Ventilator. A ventilator is a breathing machine. When on a ventilator, a breathing tube is put into the lungs. A ventilator is used when you can no longer breathe well enough on your own. You may have low oxygen levels or high carbon dioxide (CO2) levels in your blood. When you are on a ventilator, sedation and pain medicines are given to make you sleep  so your lungs can heal. Document Released: 01/21/2013 Document Revised: 06/02/2013 Document Reviewed: 01/21/2013 [facility] Patient Information 2015 [place], [place]. This information is not intended to replace advice given to you by your health care provider. Make sure you discuss any questions you have with your health care provider. Heart Failure Heart failure is a condition in  which the heart has trouble pumping blood. This means your heart does not pump blood efficiently for your body to work well. In some cases of heart failure, fluid may back up into your lungs or you may have swelling (edema) in your lower legs. Heart failure is usually a long-term (chronic) condition. It is important for you to take good care of yourself and follow your health care provider's treatment plan. CAUSES  Some health conditions can cause heart failure. Those health conditions include:  High blood pressure (hypertension). Hypertension causes the heart muscle to work harder than normal. When pressure in the blood vessels is high, the heart needs to pump (contract) with more force in order to circulate blood throughout the body. High blood pressure eventually causes the heart to become stiff and weak.  Coronary artery disease (CAD). CAD is the buildup of cholesterol and fat (plaque) in the arteries of the heart. The blockage in the arteries deprives the heart muscle of oxygen and blood. This can cause chest pain and may lead to a heart attack. High blood pressure can also contribute to CAD.  Heart attack (myocardial infarction). A heart attack occurs when one or more arteries in the heart become blocked. The loss of oxygen damages the muscle tissue of the heart. When this happens, part of the heart muscle dies. The injured tissue does not contract as well and weakens the heart's ability to pump blood.  Abnormal heart valves. When the heart valves do not open and close properly, it can cause heart failure. This makes the heart muscle pump harder to keep the blood flowing.  Heart muscle disease (cardiomyopathy or myocarditis). Heart muscle disease is damage to the heart muscle from a variety of causes. These can include drug or alcohol abuse, infections, or unknown reasons. These can increase the risk of heart failure.  Lung disease. Lung disease makes the heart work harder because the lungs do  not work properly. This can cause a strain on the heart, leading it to fail.  Diabetes. Diabetes increases the risk of heart failure. High blood sugar contributes to high fat (lipid) levels in the blood. Diabetes can also cause slow damage to tiny blood vessels that carry important nutrients to the heart muscle. When the heart does not get enough oxygen and food, it can cause the heart to become weak and stiff. This leads to a heart that does not contract efficiently.  Other conditions can contribute to heart failure. These include abnormal heart rhythms, thyroid problems, and low blood counts (anemia). Certain unhealthy behaviors can increase the risk of heart failure, including:  Being overweight.  Smoking or chewing tobacco.  Eating foods high in fat and cholesterol.  Abusing illicit drugs or alcohol.  Lacking physical activity. SYMPTOMS  Heart failure symptoms may vary and can be hard to detect. Symptoms may include:  Shortness of breath with activity, such as climbing stairs.  Persistent cough.  Swelling of the feet, ankles, legs, or abdomen.  Unexplained weight gain.  Difficulty breathing when lying flat (orthopnea).  Waking from sleep because of the need to sit up and get more air.  Rapid heartbeat.  Fatigue and  loss of energy.  Feeling light-headed, dizzy, or close to fainting.  Loss of appetite.  Nausea.  Increased urination during the night (nocturia). DIAGNOSIS  A diagnosis of heart failure is based on your history, symptoms, physical examination, and diagnostic tests. Diagnostic tests for heart failure may include:  Echocardiography.  Electrocardiography.  Chest X-ray.  Blood tests.  Exercise stress test.  Cardiac angiography.  Radionuclide scans. TREATMENT  Treatment is aimed at managing the symptoms of heart failure. Medicines, behavioral changes, or surgical intervention may be necessary to treat heart failure.  Medicines to help treat heart  failure may include:  Angiotensin-converting enzyme (ACE) inhibitors. This type of medicine blocks the effects of a blood protein called angiotensin-converting enzyme. ACE inhibitors relax (dilate) the blood vessels and help lower blood pressure.  Angiotensin receptor blockers (ARBs). This type of medicine blocks the actions of a blood protein called angiotensin. Angiotensin receptor blockers dilate the blood vessels and help lower blood pressure.  Water pills (diuretics). Diuretics cause the kidneys to remove salt and water from the blood. The extra fluid is removed through urination. This loss of extra fluid lowers the volume of blood the heart pumps.  Beta blockers. These prevent the heart from beating too fast and improve heart muscle strength.  Digitalis. This increases the force of the heartbeat.  Healthy behavior changes include:  Obtaining and maintaining a healthy weight.  Stopping smoking or chewing tobacco.  Eating heart-healthy foods.  Limiting or avoiding alcohol.  Stopping illicit drug use.  Physical activity as directed by your health care provider.  Surgical treatment for heart failure may include:  A procedure to open blocked arteries, repair damaged heart valves, or remove damaged heart muscle tissue.  A pacemaker to improve heart muscle function and control certain abnormal heart rhythms.  An internal cardioverter defibrillator to treat certain serious abnormal heart rhythms.  A left ventricular assist device (LVAD) to assist the pumping ability of the heart. HOME CARE INSTRUCTIONS   Take medicines only as directed by your health care provider. Medicines are important in reducing the workload of your heart, slowing the progression of heart failure, and improving your symptoms.  Do not stop taking your medicine unless directed by your health care provider.  Do not skip any dose of medicine.  Refill your prescriptions before you run out of medicine. Your  medicines are needed every day.  Engage in moderate physical activity if directed by your health care provider. Moderate physical activity can benefit some people. The elderly and people with severe heart failure should consult with a health care provider for physical activity recommendations.  Eat heart-healthy foods. Food choices should be free of trans fat and low in saturated fat, cholesterol, and salt (sodium). Healthy choices include fresh or frozen fruits and vegetables, fish, lean meats, legumes, fat-free or low-fat dairy products, and whole grain or high fiber foods. Talk to a dietitian to learn more about heart-healthy foods.  Limit sodium if directed by your health care provider. Sodium restriction may reduce symptoms of heart failure in some people. Talk to a dietitian to learn more about heart-healthy seasonings.  Use healthy cooking methods. Healthy cooking methods include roasting, grilling, broiling, baking, poaching, steaming, or stir-frying. Talk to a dietitian to learn more about healthy cooking methods.  Limit fluids if directed by your health care provider. Fluid restriction may reduce symptoms of heart failure in some people.  Weigh yourself every day. Daily weights are important in the early recognition of excess fluid. You  should weigh yourself every morning after you urinate and before you eat breakfast. Wear the same amount of clothing each time you weigh yourself. Record your daily weight. Provide your health care provider with your weight record.  Monitor and record your blood pressure if directed by your health care provider.  Check your pulse if directed by your health care provider.  Lose weight if directed by your health care provider. Weight loss may reduce symptoms of heart failure in some people.  Stop smoking or chewing tobacco. Nicotine makes your heart work harder by causing your blood vessels to constrict. Do not use nicotine gum or patches before talking to  your health care provider.  Keep all follow-up visits as directed by your health care provider. This is important.  Limit alcohol intake to no more than 1 drink per day for nonpregnant women and 2 drinks per day for men. One drink equals 12 ounces of beer, 5 ounces of wine, or 1 ounces of hard liquor. Drinking more than that is harmful to your heart. Tell your health care provider if you drink alcohol several times a week. Talk with your health care provider about whether alcohol is safe for you. If your heart has already been damaged by alcohol or you have severe heart failure, drinking alcohol should be stopped completely.  Stop illicit drug use.  Stay up-to-date with immunizations. It is especially important to prevent respiratory infections through current pneumococcal and influenza immunizations.  Manage other health conditions such as hypertension, diabetes, thyroid disease, or abnormal heart rhythms as directed by your health care provider.  Learn to manage stress.  Plan rest periods when fatigued.  Learn strategies to manage high temperatures. If the weather is extremely hot:  Avoid vigorous physical activity.  Use air conditioning or fans or seek a cooler location.  Avoid caffeine and alcohol.  Wear loose-fitting, lightweight, and light-colored clothing.  Learn strategies to manage cold temperatures. If the weather is extremely cold:  Avoid vigorous physical activity.  Layer clothes.  Wear mittens or gloves, a hat, and a scarf when going outside.  Avoid alcohol.  Obtain ongoing education and support as needed.  Participate in or seek rehabilitation as needed to maintain or improve independence and quality of life. SEEK MEDICAL CARE IF:   Your weight increases by 03 lb/1.4 kg in 1 day or 05 lb/2.3 kg in a week.  You have increasing shortness of breath that is unusual for you.  You are unable to participate in your usual physical activities.  You tire  easily.  You cough more than normal, especially with physical activity.  You have any or more swelling in areas such as your hands, feet, ankles, or abdomen.  You are unable to sleep because it is hard to breathe.  You feel like your heart is beating fast (palpitations).  You become dizzy or light-headed upon standing up. SEEK IMMEDIATE MEDICAL CARE IF:   You have difficulty breathing.  There is a change in mental status such as decreased alertness or difficulty with concentration.  You have a pain or discomfort in your chest.  You have an episode of fainting (syncope). MAKE SURE YOU:   Understand these instructions.  Will watch your condition.  Will get help right away if you are not doing well or get worse. Document Released: 01/16/2005 Document Revised: 06/02/2013 Document Reviewed: 02/16/2012 [facility] Patient Information 2015 [place], [place]. This information is not intended to replace advice given to you by your health care provider.  Make sure you discuss any questions you have with your health care provider. ° °

## 2013-12-20 NOTE — Discharge Summary (Signed)
Discharge summary dictated on 12/20/2013 dictation number is [phone]

## 2013-12-20 NOTE — Plan of Care (Signed)
Problem: Phase III Progression Outcomes Goal: Activity at appropriate level-compared to baseline (UP IN CHAIR FOR HEMODIALYSIS)  Outcome: Completed/Met Date Met:  12/20/13     

## 2013-12-20 NOTE — Plan of Care (Signed)
Problem: Phase III Progression Outcomes Goal: Pain controlled on oral analgesia Outcome: Completed/Met Date Met:  12/20/13
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
## 2013-12-20 NOTE — Plan of Care (Signed)
Problem: Phase III Progression Outcomes Goal: Dyspnea controlled with activity Outcome: Completed/Met Date Met:  12/20/13

## 2013-12-29 ENCOUNTER — Telehealth: Payer: Self-pay | Admitting: Physician Assistant

## 2013-12-29 ENCOUNTER — Ambulatory Visit (HOSPITAL_BASED_OUTPATIENT_CLINIC_OR_DEPARTMENT_OTHER): Payer: Medicare HMO | Admitting: Physician Assistant

## 2013-12-29 ENCOUNTER — Encounter: Payer: Self-pay | Admitting: Physician Assistant

## 2013-12-29 ENCOUNTER — Telehealth: Payer: Self-pay | Admitting: Internal Medicine

## 2013-12-29 ENCOUNTER — Ambulatory Visit (HOSPITAL_BASED_OUTPATIENT_CLINIC_OR_DEPARTMENT_OTHER): Payer: Medicare HMO

## 2013-12-29 ENCOUNTER — Other Ambulatory Visit: Payer: Self-pay | Admitting: Physician Assistant

## 2013-12-29 VITALS — BP 124/75 | HR 89 | Temp 97.4°F

## 2013-12-29 DIAGNOSIS — C3431 Malignant neoplasm of lower lobe, right bronchus or lung: Secondary | ICD-10-CM

## 2013-12-29 DIAGNOSIS — Z85118 Personal history of other malignant neoplasm of bronchus and lung: Secondary | ICD-10-CM

## 2013-12-29 DIAGNOSIS — Z95828 Presence of other vascular implants and grafts: Secondary | ICD-10-CM

## 2013-12-29 DIAGNOSIS — C3490 Malignant neoplasm of unspecified part of unspecified bronchus or lung: Secondary | ICD-10-CM

## 2013-12-29 LAB — COMPREHENSIVE METABOLIC PANEL (CC13)
ALT: 17 U/L (ref 0–55)
ANION GAP: 10 meq/L (ref 3–11)
AST: 26 U/L (ref 5–34)
Albumin: 3.5 g/dL (ref 3.5–5.0)
Alkaline Phosphatase: 76 U/L (ref 40–150)
BUN: 27.8 mg/dL — ABNORMAL HIGH (ref 7.0–26.0)
CHLORIDE: 102 meq/L (ref 98–109)
CO2: 27 meq/L (ref 22–29)
CREATININE: 1.6 mg/dL — AB (ref 0.7–1.3)
Calcium: 9.5 mg/dL (ref 8.4–10.4)
Glucose: 89 mg/dl (ref 70–140)
Potassium: 4.4 mEq/L (ref 3.5–5.1)
Sodium: 139 mEq/L (ref 136–145)
TOTAL PROTEIN: 7.4 g/dL (ref 6.4–8.3)
Total Bilirubin: 0.9 mg/dL (ref 0.20–1.20)

## 2013-12-29 LAB — CBC WITH DIFFERENTIAL/PLATELET
BASO%: 0 % (ref 0.0–2.0)
BASOS ABS: 0 10*3/uL (ref 0.0–0.1)
EOS ABS: 0 10*3/uL (ref 0.0–0.5)
EOS%: 0.8 % (ref 0.0–7.0)
HEMATOCRIT: 41.6 % (ref 38.4–49.9)
HEMOGLOBIN: 13.1 g/dL (ref 13.0–17.1)
LYMPH%: 19.2 % (ref 14.0–49.0)
MCH: 31 pg (ref 27.2–33.4)
MCHC: 31.5 g/dL — ABNORMAL LOW (ref 32.0–36.0)
MCV: 98.6 fL — AB (ref 79.3–98.0)
MONO#: 0.5 10*3/uL (ref 0.1–0.9)
MONO%: 13.7 % (ref 0.0–14.0)
NEUT%: 66.3 % (ref 39.0–75.0)
NEUTROS ABS: 2.5 10*3/uL (ref 1.5–6.5)
NRBC: 0 % (ref 0–0)
PLATELETS: 140 10*3/uL (ref 140–400)
RBC: 4.22 10*6/uL (ref 4.20–5.82)
RDW: 16.9 % — ABNORMAL HIGH (ref 11.0–14.6)
WBC: 3.8 10*3/uL — ABNORMAL LOW (ref 4.0–10.3)
lymph#: 0.7 10*3/uL — ABNORMAL LOW (ref 0.9–3.3)

## 2013-12-29 MED ORDER — HEPARIN SOD (PORK) LOCK FLUSH 100 UNIT/ML IV SOLN
500.0000 [IU] | Freq: Once | INTRAVENOUS | Status: AC
  Administered 2013-12-29: 500 [IU] via INTRAVENOUS
  Filled 2013-12-29: qty 5

## 2013-12-29 MED ORDER — SODIUM CHLORIDE 0.9 % IJ SOLN
10.0000 mL | INTRAMUSCULAR | Status: DC | PRN
  Administered 2013-12-29: 10 mL via INTRAVENOUS
  Filled 2013-12-29: qty 10

## 2013-12-29 NOTE — Telephone Encounter (Signed)
Gave avs & cal for Dec. °

## 2013-12-29 NOTE — Patient Instructions (Signed)
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
## 2013-12-29 NOTE — Patient Instructions (Signed)
Your recent restaging CT scan revealed relatively stable disease Follow-up in 3 months with another restaging CT scan of your chest to reevaluate your disease

## 2013-12-29 NOTE — Telephone Encounter (Signed)
per Aj add pt to sched...done...pt here

## 2014-01-02 ENCOUNTER — Encounter: Payer: Self-pay | Admitting: Internal Medicine

## 2014-01-02 ENCOUNTER — Ambulatory Visit (INDEPENDENT_AMBULATORY_CARE_PROVIDER_SITE_OTHER): Payer: Medicare HMO | Admitting: Internal Medicine

## 2014-01-02 VITALS — BP 102/58 | HR 78 | Ht 71.0 in | Wt 290.0 lb

## 2014-01-02 DIAGNOSIS — R0600 Dyspnea, unspecified: Secondary | ICD-10-CM

## 2014-01-02 DIAGNOSIS — I5032 Chronic diastolic (congestive) heart failure: Secondary | ICD-10-CM

## 2014-01-02 DIAGNOSIS — J439 Emphysema, unspecified: Secondary | ICD-10-CM

## 2014-01-02 DIAGNOSIS — J9611 Chronic respiratory failure with hypoxia: Secondary | ICD-10-CM

## 2014-01-02 NOTE — Patient Instructions (Addendum)
ICD-9-CM ICD-10-CM   1. Pulmonary emphysema, unspecified emphysema type 492.8 J43.9   2. Chronic respiratory failure with hypoxia 518.83 J96.11    799.02    3. Dyspnea 786.09 R06.00   4. Chronic diastolic heart failure 668.15 I50.32      Glad you are better after recent admission for heart failure (diastolic) flare up Glad COPD is stable  Glad shortness of breath better Continue O2 and other meds as before    Followup  6 months with DR Chase Caller; return or call sooner if needed Encourage rehab at followup

## 2014-01-02 NOTE — Progress Notes (Signed)
Subjective:    Patient ID: [name], male    DOB: 1945/08/09, 68 y.o.   MRN: [id number]  HPI   OV 10/31/2013  Chief Complaint  Patient presents with  . Acute Visit    [place] pt. Pt c/o DOE since lung biopsy in 2013. Pt states he only coughs 1-2 times per day and brings up yellow mucus. Pt denies CP/tightness.    Here for acute visit but for chronic dyspnea. Switching PCCM providers from Dr Gwenette Greet to Dr Chase Caller   11/21/11 - LLL  adenoca  - CT shows BAC like pattern. Confirmed on TTbx and then subsequent LLL lobectomy. Diagnosed with Stage 2B T3, NO, Mo NSCLC (tumor size 11.5cm) and then Rx with cisplaint and limta thrugh April 2014 followed by 6 cycles of paclitaxel and avastin through early to mid 2015. His most recent CT Aug 2014 did not show disease progression but hasd some increase in mediastinal nodes  His main issue that he is having significant dyspnea - class 2-3 with exertion ad relieved by rest since lobectomy 2 years ago in Oct 2013.  He is says overall it is unchanged. Moderate in severity but sometimes severe in severity. Clearly exertional. Rest helps. CHronic o2 Rx x 2 years  Not helping. Says cardiac status is optimal per his understanding through Dr Terrence Dupont. He is frustrated. Past few months thinks dyspnea might be wrose. Past few weeks noticing random wheezing. No associted cough, orthopnea, pnd, hemoptysis  He did see Dr Gwenette Greet in dec 2014 who tried him on stiolto and explained multifactorial nature of dyspnea but he does not remember.  Other dyspnea relevant details   - Obese : Body mass index is 40.81 kg/(m^2).Marland Kitchen No formal dx of OSA but at risk - s/p lobectomy 2013 : Spirometry 01/22/13:  - moderate obstruction - fev1 1.9L/62%, RAtio 64. Currently untreated other than 02  - Presumed diastolic cHf based on med list and normal echo may 2014 - sees DR Lona Kettle- on non specific beta blocker Coreg - DEconditioned- does only limited ADLs at home - gets dyspneic. Never  been to rehab post chemo or lobectomy    11/21/2013 Follow up    LLL  adenoca  - CT shows BAC like pattern. Confirmed on TTbx and then subsequent LLL lobectomy. Diagnosed with Stage 2B T3, NO, Mo NSCLC (tumor size 11.5cm) and then Rx with cisplaint and limta thrugh April 2014 followed by 6 cycles of paclitaxel and avastin through early to mid 2015. His most recent CT Aug 2015 did not show disease progression but hasd some increase in mediastinal nodes  Pt returns for follow up . Seen 3 weeks ago for increased cough and shortness of breath over last 2 years, (increased after lung surgery)  Pt was started on Flovent and Spiriva  And given prednisone taper for presumed COPD flare . He says he felt some better but was unable to get inhalers at pharmacy, does not think they are covered. He complains thet he still has cough and intermittent wheezes.  Spirometry today shows restrictive process with FVC at 53% FEV1 51 and ratio 74.Decreased mid flows .  Of note he remains on ACE inhibitor and non selective beta blocker.  Denies chest pain , orthopnea,  N/v/d, overt reflux, fever or discolored mucus .  He has chronic leg swelling , lasix 160mg  daily   REC Try Spiriva Handihaler 1 puff daily  Call back if not covered by your insurance .  Stop Altace .  Begin Diovan 160mg  daily  follow up Dr. Terrence Dupont in 3 weeks for blood pressure check .  Follow up Dr. Chase Caller in 6 weeks and As needed   Please contact office for sooner follow up if symptoms do not improve or worsen or seek emergency care     OV 01/02/2014  Chief Complaint  Patient presents with  . Follow-up    Pt recently admitted to [facility] for CHF. Pt states his breathing has improved since the last OV. Pt denies cough and Cp/tightness.     Follow-up chronic respiratory failure with chronic dyspnea class 3 that is multifactorial from lobectomy, obesity, deconditioning, COPD and chronic diastolic heart failure   - He saw my nurse  practitioner last 11/21/2013. Then shortly before Thanksgiving 2015 he was admitted for heart failure exacerbation and has been diuresed and since then has been feeling much better. Overall since first seeing me in the fall of 2015 he says that his dyspnea is better. No longer in a  Wheelchair. He is more condition and is able to walk with oxygen. He satisfied with his current health status. He says he is compliant with his oxygen and Spiriva. There are no new issues.  - In terms of his cancer he underwent CT scan of the chest 12/29/2013 and in review of Dr. Arvilla Market physician assistant l Johnson's note It appears that cancer is under remission and he is on observation therapy  Review of Systems  Constitutional: Negative for fever and unexpected weight change.  HENT: Negative for congestion, dental problem, ear pain, nosebleeds, postnasal drip, rhinorrhea, sinus pressure, sneezing, sore throat and trouble swallowing.   Eyes: Negative for redness and itching.  Respiratory: Positive for shortness of breath. Negative for cough, chest tightness and wheezing.   Cardiovascular: Negative for palpitations and leg swelling.  Gastrointestinal: Negative for nausea and vomiting.  Genitourinary: Negative for dysuria.  Musculoskeletal: Negative for joint swelling.  Skin: Negative for rash.  Neurological: Negative for headaches.  Hematological: Does not bruise/bleed easily.  Psychiatric/Behavioral: Negative for dysphoric mood. The patient is not nervous/anxious.    Current outpatient prescriptions: aspirin EC 81 MG tablet, Take 81 mg by mouth daily., Disp: , Rfl: ;  budesonide-formoterol (SYMBICORT) 160-4.5 MCG/ACT inhaler, Inhale 2 puffs into the lungs 2 (two) times daily., Disp: 1 Inhaler, Rfl: 12;  digoxin (LANOXIN) 0.125 MG tablet, Take 0.5 tablets (0.0625 mg total) by mouth daily., Disp: 30 tablet, Rfl: 3 furosemide (LASIX) 40 MG tablet, Take 2 tablets (80 mg total) by mouth 2 (two) times daily., Disp:  20 tablet, Rfl: 0;  levalbuterol (XOPENEX HFA) 45 MCG/ACT inhaler, Inhale 1-2 puffs into the lungs every 6 (six) hours as needed for wheezing., Disp: 1 Inhaler, Rfl: 12;  metolazone (ZAROXOLYN) 5 MG tablet, Take 1 tablet (5 mg total) by mouth daily., Disp: 30 tablet, Rfl: 3 metoprolol tartrate (LOPRESSOR) 12.5 mg TABS tablet, Take 0.5 tablets (12.5 mg total) by mouth every 12 (twelve) hours., Disp: 30 tablet, Rfl: 3;  potassium chloride SA (K-DUR,KLOR-CON) 20 MEQ tablet, Take 1 tablet (20 mEq total) by mouth 2 (two) times daily., Disp: 60 tablet, Rfl: 3;  tiotropium (SPIRIVA) 18 MCG inhalation capsule, Place 1 capsule (18 mcg total) into inhaler and inhale daily., Disp: 30 capsule, Rfl: 5 valsartan (DIOVAN) 160 MG tablet, Take 1 tablet (160 mg total) by mouth daily., Disp: 30 tablet, Rfl: 1 No current facility-administered medications for this visit. Facility-Administered Medications Ordered in Other Visits: sodium chloride 0.9 % injection 10 mL, 10 mL, Intracatheter,  PRN, Curt Bears, MD, 10 mL at 09/03/12 1534     Objective:   Physical Exam  Filed Vitals:   01/02/14 1049  BP: 102/58  Pulse: 78  Height: 5\' 11"  (1.803 m)  Weight: 290 lb (131.543 kg)  SpO2: 93%   ursing note and vitals reviewed. Constitutional: He is oriented to person, place, and time. He appears well-developed and well-nourished. No distress.  Obese Body mass index is 40.46 kg/(m^2).  No longer Sitting in wheel chair  Looks  More conditioned.   HENT:  Head: Normocephalic and atraumatic.  Right Ear: External ear normal.  Left Ear: External ear normal.  Mouth/Throat: Oropharynx is clear and moist. No oropharyngeal exudate.  o2 on  Eyes: Conjunctivae and EOM are normal. Pupils are equal, round, and reactive to light. Right eye exhibits no discharge. Left eye exhibits no discharge. No scleral icterus.  Neck: Normal range of motion. Neck supple. No JVD present. No tracheal deviation present. No thyromegaly present.    Cardiovascular: Normal rate, regular rhythm and intact distal pulses.  Exam reveals no gallop and no friction rub.   No murmur heard. Pulmonary/Chest: Effort normal and breath sounds normal. No respiratory distress. He has no wheezes. He has no rales. He exhibits no tenderness.  Abdominal: Soft. Bowel sounds are normal. He exhibits no distension and no mass. There is no tenderness. There is no rebound and no guarding.  Musculoskeletal: Normal range of motion. He exhibits edema. He exhibits no tenderness.  Chronic venous stasis edema +  Lymphadenopathy:    He has no cervical adenopathy.  Neurological: He is alert and oriented to person, place, and time. He has normal reflexes. No cranial nerve deficit. Coordination normal.  Skin: Skin is warm and dry. No rash noted. He is not diaphoretic. No erythema. No pallor.  Psychiatric: He has a normal mood and affect. His behavior is normal. Judgment and thought content normal.        Assessment & Plan:     ICD-9-CM ICD-10-CM   1. Pulmonary emphysema, unspecified emphysema type 492.8 J43.9   2. Chronic respiratory failure with hypoxia 518.83 J96.11    799.02    3. Dyspnea 786.09 R06.00   4. Chronic diastolic heart failure 240.97 I50.32     Glad you are better after recent admission for heart failure (diastolic) flare up Glad COPD is stable  Glad shortness of breath better Continue O2 and other meds as before    Followup  6 months with DR Chase Caller; return or call sooner if needed enocucragte rehab at followup     Dr. Brand Males, M.D., [facility].C.P Pulmonary and Critical Care Medicine Staff Physician [facility] Pager: [phone], If no answer or between  15:00h - 7:00h: call [phone]  01/02/2014 11:24 AM

## 2014-01-06 ENCOUNTER — Other Ambulatory Visit: Payer: Self-pay | Admitting: Medical Oncology

## 2014-01-06 ENCOUNTER — Telehealth: Payer: Self-pay | Admitting: Internal Medicine

## 2014-01-08 ENCOUNTER — Encounter (HOSPITAL_COMMUNITY): Payer: Self-pay | Admitting: Cardiology

## 2014-02-09 ENCOUNTER — Ambulatory Visit (HOSPITAL_BASED_OUTPATIENT_CLINIC_OR_DEPARTMENT_OTHER): Payer: Medicare HMO

## 2014-02-09 VITALS — BP 103/67 | HR 84 | Temp 97.6°F

## 2014-02-09 DIAGNOSIS — C3412 Malignant neoplasm of upper lobe, left bronchus or lung: Secondary | ICD-10-CM

## 2014-02-09 DIAGNOSIS — Z95828 Presence of other vascular implants and grafts: Secondary | ICD-10-CM

## 2014-02-09 DIAGNOSIS — Z452 Encounter for adjustment and management of vascular access device: Secondary | ICD-10-CM

## 2014-02-09 MED ORDER — SODIUM CHLORIDE 0.9 % IJ SOLN
10.0000 mL | INTRAMUSCULAR | Status: DC | PRN
  Administered 2014-02-09: 10 mL via INTRAVENOUS
  Filled 2014-02-09: qty 10

## 2014-02-09 MED ORDER — HEPARIN SOD (PORK) LOCK FLUSH 100 UNIT/ML IV SOLN
500.0000 [IU] | Freq: Once | INTRAVENOUS | Status: AC
  Administered 2014-02-09: 500 [IU] via INTRAVENOUS
  Filled 2014-02-09: qty 5

## 2014-02-09 NOTE — Patient Instructions (Signed)

## 2014-03-20 ENCOUNTER — Other Ambulatory Visit (HOSPITAL_BASED_OUTPATIENT_CLINIC_OR_DEPARTMENT_OTHER): Payer: Medicare HMO

## 2014-03-20 ENCOUNTER — Encounter (HOSPITAL_COMMUNITY): Payer: Self-pay

## 2014-03-20 ENCOUNTER — Ambulatory Visit (HOSPITAL_BASED_OUTPATIENT_CLINIC_OR_DEPARTMENT_OTHER): Payer: Medicare HMO

## 2014-03-20 ENCOUNTER — Ambulatory Visit (HOSPITAL_COMMUNITY)
Admission: RE | Admit: 2014-03-20 | Discharge: 2014-03-20 | Disposition: A | Discharge status: Home / Self Care | Payer: Medicare HMO | Source: Ambulatory Visit | Attending: Physician Assistant | Admitting: Physician Assistant

## 2014-03-20 VITALS — BP 122/70 | HR 80 | Temp 97.7°F

## 2014-03-20 DIAGNOSIS — R062 Wheezing: Secondary | ICD-10-CM | POA: Insufficient documentation

## 2014-03-20 DIAGNOSIS — C3412 Malignant neoplasm of upper lobe, left bronchus or lung: Secondary | ICD-10-CM

## 2014-03-20 DIAGNOSIS — C3431 Malignant neoplasm of lower lobe, right bronchus or lung: Secondary | ICD-10-CM | POA: Insufficient documentation

## 2014-03-20 DIAGNOSIS — Z9889 Other specified postprocedural states: Secondary | ICD-10-CM | POA: Insufficient documentation

## 2014-03-20 DIAGNOSIS — Z87891 Personal history of nicotine dependence: Secondary | ICD-10-CM | POA: Diagnosis not present

## 2014-03-20 DIAGNOSIS — Z9221 Personal history of antineoplastic chemotherapy: Secondary | ICD-10-CM | POA: Insufficient documentation

## 2014-03-20 DIAGNOSIS — R0602 Shortness of breath: Secondary | ICD-10-CM | POA: Diagnosis not present

## 2014-03-20 DIAGNOSIS — R05 Cough: Secondary | ICD-10-CM | POA: Insufficient documentation

## 2014-03-20 DIAGNOSIS — Z95828 Presence of other vascular implants and grafts: Secondary | ICD-10-CM

## 2014-03-20 DIAGNOSIS — Z452 Encounter for adjustment and management of vascular access device: Secondary | ICD-10-CM

## 2014-03-20 LAB — COMPREHENSIVE METABOLIC PANEL (CC13)
ALT: 10 U/L (ref 0–55)
AST: 24 U/L (ref 5–34)
Albumin: 3.6 g/dL (ref 3.5–5.0)
Alkaline Phosphatase: 73 U/L (ref 40–150)
Anion Gap: 11 mEq/L (ref 3–11)
BUN: 41.9 mg/dL — ABNORMAL HIGH (ref 7.0–26.0)
CALCIUM: 9.2 mg/dL (ref 8.4–10.4)
CHLORIDE: 104 meq/L (ref 98–109)
CO2: 26 meq/L (ref 22–29)
Creatinine: 1.7 mg/dL — ABNORMAL HIGH (ref 0.7–1.3)
EGFR: 46 mL/min/{1.73_m2} — AB (ref >90)
GLUCOSE: 105 mg/dL (ref 70–140)
POTASSIUM: 3.8 meq/L (ref 3.5–5.1)
Sodium: 141 mEq/L (ref 136–145)
TOTAL PROTEIN: 7.6 g/dL (ref 6.4–8.3)
Total Bilirubin: 1.03 mg/dL (ref 0.20–1.20)

## 2014-03-20 LAB — CBC WITH DIFFERENTIAL/PLATELET
BASO%: 0 % (ref 0.0–2.0)
Basophils Absolute: 0 10*3/uL (ref 0.0–0.1)
EOS%: 1 % (ref 0.0–7.0)
Eosinophils Absolute: 0 10*3/uL (ref 0.0–0.5)
HCT: 40.1 % (ref 38.4–49.9)
HGB: 12.4 g/dL — ABNORMAL LOW (ref 13.0–17.1)
LYMPH#: 0.7 10*3/uL — AB (ref 0.9–3.3)
LYMPH%: 17.2 % (ref 14.0–49.0)
MCH: 31 pg (ref 27.2–33.4)
MCHC: 30.9 g/dL — AB (ref 32.0–36.0)
MCV: 100.3 fL — ABNORMAL HIGH (ref 79.3–98.0)
MONO#: 0.5 10*3/uL (ref 0.1–0.9)
MONO%: 12.9 % (ref 0.0–14.0)
NEUT#: 2.8 10*3/uL (ref 1.5–6.5)
NEUT%: 68.9 % (ref 39.0–75.0)
Platelets: 170 10*3/uL (ref 140–400)
RBC: 4 10*6/uL — ABNORMAL LOW (ref 4.20–5.82)
RDW: 17.1 % — ABNORMAL HIGH (ref 11.0–14.6)
WBC: 4.1 10*3/uL (ref 4.0–10.3)

## 2014-03-20 MED ORDER — SODIUM CHLORIDE 0.9 % IJ SOLN
10.0000 mL | INTRAMUSCULAR | Status: DC | PRN
  Administered 2014-03-20: 10 mL via INTRAVENOUS
  Filled 2014-03-20: qty 10

## 2014-03-20 MED ORDER — IOHEXOL 300 MG/ML  SOLN
80.0000 mL | Freq: Once | INTRAMUSCULAR | Status: AC | PRN
  Administered 2014-03-20: 80 mL via INTRAVENOUS

## 2014-03-20 NOTE — Patient Instructions (Signed)

## 2014-03-23 ENCOUNTER — Encounter: Payer: Self-pay | Admitting: Internal Medicine

## 2014-03-23 ENCOUNTER — Ambulatory Visit (HOSPITAL_BASED_OUTPATIENT_CLINIC_OR_DEPARTMENT_OTHER): Payer: Medicare HMO | Admitting: Internal Medicine

## 2014-03-23 ENCOUNTER — Other Ambulatory Visit: Payer: Medicare HMO

## 2014-03-23 ENCOUNTER — Telehealth: Payer: Self-pay | Admitting: Internal Medicine

## 2014-03-23 VITALS — BP 117/69 | HR 66 | Resp 18 | Ht 71.0 in | Wt 295.7 lb

## 2014-03-23 DIAGNOSIS — C3431 Malignant neoplasm of lower lobe, right bronchus or lung: Secondary | ICD-10-CM

## 2014-03-23 DIAGNOSIS — C3432 Malignant neoplasm of lower lobe, left bronchus or lung: Secondary | ICD-10-CM

## 2014-03-23 NOTE — Telephone Encounter (Signed)
Gave avs & calendar for May. °
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
## 2014-04-06 ENCOUNTER — Telehealth: Payer: Self-pay | Admitting: *Deleted

## 2014-04-06 NOTE — Telephone Encounter (Signed)
-----   Message from [name] Adam, PA-C sent at 04/06/2014  4:16 PM EST ----- Abnormal results, please call and notify patient to increase po fluids. Kidney function is elevated

## 2014-04-06 NOTE — Telephone Encounter (Signed)
Called and informed patient to increase fluids. Kidney function is elevated.  Per Awilda Metro, PA.  Patient verbalized understanding.

## 2014-04-07 ENCOUNTER — Telehealth: Payer: Self-pay | Admitting: Medical Oncology

## 2014-04-07 NOTE — Telephone Encounter (Signed)
-----   Message from Carlton Adam, PA-C sent at 04/06/2014  4:16 PM EST ----- Abnormal results, please call and notify patient to increase po fluids. Kidney function is elevated

## 2014-04-07 NOTE — Telephone Encounter (Signed)
Called pt -no answer then saw he had already been notified by [place].

## 2014-04-11 ENCOUNTER — Encounter (HOSPITAL_COMMUNITY): Payer: Self-pay | Admitting: Family Medicine

## 2014-04-11 ENCOUNTER — Emergency Department (HOSPITAL_COMMUNITY): Payer: Medicare HMO

## 2014-04-11 ENCOUNTER — Inpatient Hospital Stay (HOSPITAL_COMMUNITY)
Admission: EM | Admit: 2014-04-11 | Discharge: 2014-04-16 | DRG: 291 | Disposition: A | Discharge status: Home / Self Care | Payer: Medicare HMO | Attending: Cardiovascular Disease | Admitting: Cardiovascular Disease

## 2014-04-11 DIAGNOSIS — J449 Chronic obstructive pulmonary disease, unspecified: Secondary | ICD-10-CM | POA: Diagnosis present

## 2014-04-11 DIAGNOSIS — Z801 Family history of malignant neoplasm of trachea, bronchus and lung: Secondary | ICD-10-CM

## 2014-04-11 DIAGNOSIS — G4700 Insomnia, unspecified: Secondary | ICD-10-CM | POA: Diagnosis present

## 2014-04-11 DIAGNOSIS — Z9221 Personal history of antineoplastic chemotherapy: Secondary | ICD-10-CM

## 2014-04-11 DIAGNOSIS — Z8249 Family history of ischemic heart disease and other diseases of the circulatory system: Secondary | ICD-10-CM | POA: Diagnosis not present

## 2014-04-11 DIAGNOSIS — E785 Hyperlipidemia, unspecified: Secondary | ICD-10-CM | POA: Diagnosis present

## 2014-04-11 DIAGNOSIS — Z87442 Personal history of urinary calculi: Secondary | ICD-10-CM

## 2014-04-11 DIAGNOSIS — I272 Other secondary pulmonary hypertension: Secondary | ICD-10-CM | POA: Diagnosis present

## 2014-04-11 DIAGNOSIS — C3492 Malignant neoplasm of unspecified part of left bronchus or lung: Secondary | ICD-10-CM | POA: Diagnosis present

## 2014-04-11 DIAGNOSIS — R011 Cardiac murmur, unspecified: Secondary | ICD-10-CM | POA: Diagnosis present

## 2014-04-11 DIAGNOSIS — Z6841 Body Mass Index (BMI) 40.0 and over, adult: Secondary | ICD-10-CM

## 2014-04-11 DIAGNOSIS — N182 Chronic kidney disease, stage 2 (mild): Secondary | ICD-10-CM | POA: Diagnosis present

## 2014-04-11 DIAGNOSIS — J8410 Pulmonary fibrosis, unspecified: Secondary | ICD-10-CM | POA: Diagnosis present

## 2014-04-11 DIAGNOSIS — Z836 Family history of other diseases of the respiratory system: Secondary | ICD-10-CM

## 2014-04-11 DIAGNOSIS — Z87891 Personal history of nicotine dependence: Secondary | ICD-10-CM | POA: Diagnosis not present

## 2014-04-11 DIAGNOSIS — E876 Hypokalemia: Secondary | ICD-10-CM | POA: Diagnosis present

## 2014-04-11 DIAGNOSIS — J189 Pneumonia, unspecified organism: Secondary | ICD-10-CM | POA: Diagnosis present

## 2014-04-11 DIAGNOSIS — I129 Hypertensive chronic kidney disease with stage 1 through stage 4 chronic kidney disease, or unspecified chronic kidney disease: Secondary | ICD-10-CM | POA: Diagnosis present

## 2014-04-11 DIAGNOSIS — M1389 Other specified arthritis, multiple sites: Secondary | ICD-10-CM | POA: Diagnosis present

## 2014-04-11 DIAGNOSIS — J9621 Acute and chronic respiratory failure with hypoxia: Secondary | ICD-10-CM | POA: Diagnosis present

## 2014-04-11 DIAGNOSIS — Z23 Encounter for immunization: Secondary | ICD-10-CM | POA: Diagnosis not present

## 2014-04-11 DIAGNOSIS — Z902 Acquired absence of lung [part of]: Secondary | ICD-10-CM | POA: Diagnosis present

## 2014-04-11 DIAGNOSIS — I872 Venous insufficiency (chronic) (peripheral): Secondary | ICD-10-CM | POA: Diagnosis present

## 2014-04-11 DIAGNOSIS — I5021 Acute systolic (congestive) heart failure: Principal | ICD-10-CM | POA: Diagnosis present

## 2014-04-11 DIAGNOSIS — I5023 Acute on chronic systolic (congestive) heart failure: Secondary | ICD-10-CM | POA: Diagnosis present

## 2014-04-11 LAB — CBC
HCT: 37.2 % — ABNORMAL LOW (ref 39.0–52.0)
HEMOGLOBIN: 11.5 g/dL — AB (ref 13.0–17.0)
MCH: 31.2 pg (ref 26.0–34.0)
MCHC: 30.9 g/dL (ref 30.0–36.0)
MCV: 100.8 fL — ABNORMAL HIGH (ref 78.0–100.0)
Platelets: 173 10*3/uL (ref 150–400)
RBC: 3.69 MIL/uL — AB (ref 4.22–5.81)
RDW: 17.3 % — ABNORMAL HIGH (ref 11.5–15.5)
WBC: 4 10*3/uL (ref 4.0–10.5)

## 2014-04-11 LAB — BRAIN NATRIURETIC PEPTIDE
B NATRIURETIC PEPTIDE 5: 679.6 pg/mL — AB (ref 0.0–100.0)
B Natriuretic Peptide: 545.8 pg/mL — ABNORMAL HIGH (ref 0.0–100.0)

## 2014-04-11 LAB — BASIC METABOLIC PANEL
Anion gap: 10 (ref 5–15)
BUN: 32 mg/dL — ABNORMAL HIGH (ref 6–23)
CHLORIDE: 102 mmol/L (ref 96–112)
CO2: 27 mmol/L (ref 19–32)
Calcium: 8.9 mg/dL (ref 8.4–10.5)
Creatinine, Ser: 1.48 mg/dL — ABNORMAL HIGH (ref 0.50–1.35)
GFR calc Af Amer: 54 mL/min — ABNORMAL LOW (ref >90)
GFR calc non Af Amer: 47 mL/min — ABNORMAL LOW (ref >90)
Glucose, Bld: 88 mg/dL (ref 70–99)
Potassium: 3.8 mmol/L (ref 3.5–5.1)
Sodium: 139 mmol/L (ref 135–145)

## 2014-04-11 LAB — I-STAT TROPONIN, ED: TROPONIN I, POC: 0.01 ng/mL (ref 0.00–0.08)

## 2014-04-11 LAB — TSH: TSH: 2.025 u[IU]/mL (ref 0.350–4.500)

## 2014-04-11 LAB — TROPONIN I: Troponin I: <0.03 ng/mL (ref <0.031)

## 2014-04-11 LAB — DIGOXIN LEVEL: Digoxin Level: 1.2 ng/mL (ref 0.8–2.0)

## 2014-04-11 MED ORDER — ASPIRIN EC 81 MG PO TBEC
81.0000 mg | DELAYED_RELEASE_TABLET | Freq: Every day | ORAL | Status: DC
  Administered 2014-04-12 – 2014-04-16 (×5): 81 mg via ORAL
  Filled 2014-04-11 (×6): qty 1

## 2014-04-11 MED ORDER — ALBUTEROL SULFATE (2.5 MG/3ML) 0.083% IN NEBU
5.0000 mg | INHALATION_SOLUTION | Freq: Once | RESPIRATORY_TRACT | Status: AC
  Administered 2014-04-11: 5 mg via RESPIRATORY_TRACT
  Filled 2014-04-11: qty 6

## 2014-04-11 MED ORDER — SODIUM CHLORIDE 0.9 % IV SOLN: 250.0000 mL | INTRAVENOUS | Status: DC | PRN

## 2014-04-11 MED ORDER — GUAIFENESIN ER 600 MG PO TB12
600.0000 mg | ORAL_TABLET | Freq: Two times a day (BID) | ORAL | Status: DC
  Administered 2014-04-11 – 2014-04-16 (×10): 600 mg via ORAL
  Filled 2014-04-11 (×11): qty 1

## 2014-04-11 MED ORDER — CARVEDILOL 3.125 MG PO TABS
3.1250 mg | ORAL_TABLET | Freq: Two times a day (BID) | ORAL | Status: DC
  Administered 2014-04-11 – 2014-04-16 (×10): 3.125 mg via ORAL
  Filled 2014-04-11 (×12): qty 1

## 2014-04-11 MED ORDER — SODIUM CHLORIDE 0.9 % IJ SOLN
3.0000 mL | Freq: Two times a day (BID) | INTRAMUSCULAR | Status: DC
  Administered 2014-04-11 – 2014-04-16 (×6): 3 mL via INTRAVENOUS

## 2014-04-11 MED ORDER — FUROSEMIDE 10 MG/ML IJ SOLN
40.0000 mg | Freq: Two times a day (BID) | INTRAMUSCULAR | Status: DC
  Administered 2014-04-11 – 2014-04-15 (×9): 40 mg via INTRAVENOUS
  Filled 2014-04-11 (×11): qty 4

## 2014-04-11 MED ORDER — ALBUTEROL SULFATE (2.5 MG/3ML) 0.083% IN NEBU
2.5000 mg | INHALATION_SOLUTION | Freq: Four times a day (QID) | RESPIRATORY_TRACT | Status: DC
  Administered 2014-04-11 – 2014-04-12 (×3): 2.5 mg via RESPIRATORY_TRACT
  Filled 2014-04-11 (×3): qty 3

## 2014-04-11 MED ORDER — HEPARIN SODIUM (PORCINE) 5000 UNIT/ML IJ SOLN
5000.0000 [IU] | Freq: Three times a day (TID) | INTRAMUSCULAR | Status: DC
  Administered 2014-04-11 – 2014-04-16 (×13): 5000 [IU] via SUBCUTANEOUS
  Filled 2014-04-11 (×18): qty 1

## 2014-04-11 MED ORDER — SODIUM CHLORIDE 0.9 % IJ SOLN
10.0000 mL | INTRAMUSCULAR | Status: DC | PRN
  Administered 2014-04-12 – 2014-04-16 (×8): 10 mL
  Filled 2014-04-11 (×8): qty 40

## 2014-04-11 MED ORDER — IPRATROPIUM BROMIDE 0.02 % IN SOLN
0.5000 mg | Freq: Once | RESPIRATORY_TRACT | Status: AC
  Administered 2014-04-11: 0.5 mg via RESPIRATORY_TRACT
  Filled 2014-04-11: qty 2.5

## 2014-04-11 MED ORDER — IRBESARTAN 75 MG PO TABS
75.0000 mg | ORAL_TABLET | Freq: Every day | ORAL | Status: DC
  Administered 2014-04-12 – 2014-04-16 (×5): 75 mg via ORAL
  Filled 2014-04-11 (×5): qty 1

## 2014-04-11 MED ORDER — DEXTROSE 5 % IV SOLN
1.0000 g | INTRAVENOUS | Status: DC
  Administered 2014-04-11 – 2014-04-15 (×5): 1 g via INTRAVENOUS
  Filled 2014-04-11 (×6): qty 10

## 2014-04-11 MED ORDER — ONDANSETRON HCL 4 MG/2ML IJ SOLN: 4.0000 mg | Freq: Four times a day (QID) | INTRAMUSCULAR | Status: DC | PRN

## 2014-04-11 MED ORDER — SODIUM CHLORIDE 0.9 % IJ SOLN: 3.0000 mL | INTRAMUSCULAR | Status: DC | PRN

## 2014-04-11 MED ORDER — DIGOXIN 0.0625 MG HALF TABLET
0.0625 mg | ORAL_TABLET | Freq: Every day | ORAL | Status: DC
  Administered 2014-04-12 – 2014-04-14 (×3): 0.0625 mg via ORAL
  Filled 2014-04-11 (×4): qty 1

## 2014-04-11 MED ORDER — METHYLPREDNISOLONE SODIUM SUCC 125 MG IJ SOLR
125.0000 mg | Freq: Once | INTRAMUSCULAR | Status: AC
  Administered 2014-04-11: 125 mg via INTRAVENOUS
  Filled 2014-04-11: qty 2

## 2014-04-11 MED ORDER — POTASSIUM CHLORIDE CRYS ER 20 MEQ PO TBCR
20.0000 meq | EXTENDED_RELEASE_TABLET | Freq: Two times a day (BID) | ORAL | Status: DC
  Administered 2014-04-11 – 2014-04-13 (×5): 20 meq via ORAL
  Filled 2014-04-11 (×7): qty 1

## 2014-04-11 MED ORDER — INFLUENZA VAC SPLIT QUAD 0.5 ML IM SUSY
0.5000 mL | PREFILLED_SYRINGE | INTRAMUSCULAR | Status: AC
  Administered 2014-04-12: 0.5 mL via INTRAMUSCULAR
  Filled 2014-04-11: qty 0.5

## 2014-04-11 MED ORDER — ALPRAZOLAM 0.25 MG PO TABS: 0.2500 mg | ORAL_TABLET | Freq: Two times a day (BID) | ORAL | Status: DC | PRN

## 2014-04-11 MED ORDER — ACETAMINOPHEN 325 MG PO TABS: 650.0000 mg | ORAL_TABLET | ORAL | Status: DC | PRN
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
## 2014-04-11 NOTE — ED Notes (Signed)
Pt SpO2 at 77% RA on arrival to room.  Pt placed on 2 L nasal cannula.  Pt reports PRN 2L O2 at home.

## 2014-04-11 NOTE — ED Notes (Signed)
Pt here for SOB for a while. sts hx of CHF. Denise any pain. sts some edema in BLE and abdomen.

## 2014-04-11 NOTE — Progress Notes (Addendum)
Pt arrived to the unit, requesting to sit up in chair, placed on 2L02 family at the bedside. Oriented to the room. Questions answered. Placed on telemetry verified with Natelie.

## 2014-04-12 DIAGNOSIS — I5021 Acute systolic (congestive) heart failure: Secondary | ICD-10-CM | POA: Diagnosis not present

## 2014-04-12 LAB — BASIC METABOLIC PANEL
ANION GAP: 7 (ref 5–15)
BUN: 31 mg/dL — ABNORMAL HIGH (ref 6–23)
CHLORIDE: 101 mmol/L (ref 96–112)
CO2: 30 mmol/L (ref 19–32)
Calcium: 9.1 mg/dL (ref 8.4–10.5)
Creatinine, Ser: 1.47 mg/dL — ABNORMAL HIGH (ref 0.50–1.35)
GFR calc Af Amer: 55 mL/min — ABNORMAL LOW (ref >90)
GFR calc non Af Amer: 47 mL/min — ABNORMAL LOW (ref >90)
Glucose, Bld: 162 mg/dL — ABNORMAL HIGH (ref 70–99)
POTASSIUM: 3.7 mmol/L (ref 3.5–5.1)
SODIUM: 138 mmol/L (ref 135–145)

## 2014-04-12 LAB — TROPONIN I
Troponin I: <0.03 ng/mL (ref <0.031)
Troponin I: <0.03 ng/mL (ref <0.031)

## 2014-04-12 MED ORDER — ALBUTEROL SULFATE (2.5 MG/3ML) 0.083% IN NEBU: 2.5000 mg | INHALATION_SOLUTION | Freq: Four times a day (QID) | RESPIRATORY_TRACT | Status: DC | PRN

## 2014-04-12 NOTE — Progress Notes (Signed)
Utilization Review Completed.   Lacheryl Niesen, RN, BSN Nurse Case Manager  

## 2014-04-12 NOTE — Progress Notes (Signed)
Ref: Clent Demark, MD   Subjective:  Feeling little better. Elevated BNP-679.6 pg/mL. Afebrile. Patient made aware of pulmonary fibrosis. 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 Troponin-I negative x 3.  Objective:  Vital Signs in the last 24 hours: Temp:  [97.2 F (36.2 C)-98.6 F (37 C)] 97.2 F (36.2 C) (03/13 1033) Pulse Rate:  [54-72] 66 (03/13 1033) Cardiac Rhythm:  [-] Normal sinus rhythm (03/13 0900) Resp:  [18-24] 18 (03/13 0600) BP: (100-119)/(48-62) 100/48 mmHg (03/13 1033) SpO2:  [91 %-98 %] 97 % (03/13 1033) Weight:  [133.539 kg (294 lb 6.4 oz)-133.9 kg (295 lb 3.1 oz)] 133.539 kg (294 lb 6.4 oz) (03/13 0600)  Physical Exam: BP Readings from Last 1 Encounters:  04/12/14 100/48    Wt Readings from Last 1 Encounters:  04/12/14 133.539 kg (294 lb 6.4 oz)    Weight change:   HEENT: [place]/AT, Eyes-Brown, PERL, EOMI, Conjunctiva-Pink, Sclera-Non-icteric Neck: + JVD, No bruit, Trachea midline. Lungs:  Basal crackles, Bilateral. Cardiac:  Regular rhythm, normal S1 and S2, no S3. II/VI systolic murmur. Abdomen:  Soft, non-tender. Extremities:  2 + edema present. No cyanosis. No clubbing. CNS: AxOx3, Cranial nerves grossly intact, moves all 4 extremities. Right handed. Skin: Warm and dry.   Intake/Output from previous day: 03/12 0701 - 03/13 0700 In: 170 [P.O.:120; IV Piggyback:50] Out: 750 [Urine:750]    Lab Results: BMET    Component Value Date/Time   NA 138 04/12/2014 0600   NA 139 04/11/2014 1200   NA 141 03/20/2014 0848   NA 139 12/29/2013 1017   NA 141 12/19/2013 0514   NA 141 12/15/2013 0828   K 3.7 04/12/2014 0600   K 3.8 04/11/2014 1200   K 3.8 03/20/2014 0848   K 4.4 12/29/2013 1017   K 4.1 12/19/2013 0514   K 4.8 12/15/2013 0828   CL 101 04/12/2014 0600   CL 102 04/11/2014 1200   CL 100 12/19/2013 0514   CL 105 07/24/2012 0859   CL 102 05/28/2012 1149   CL 104 05/21/2012 1243   CO2 30 04/12/2014 0600   CO2  27 04/11/2014 1200   CO2 26 03/20/2014 0848   CO2 27 12/29/2013 1017   CO2 27 12/19/2013 0514   CO2 25 12/15/2013 0828   GLUCOSE 162* 04/12/2014 0600   GLUCOSE 88 04/11/2014 1200   GLUCOSE 105 03/20/2014 0848   GLUCOSE 89 12/29/2013 1017   GLUCOSE 120* 12/19/2013 0514   GLUCOSE 103 12/15/2013 0828   GLUCOSE 126* 07/24/2012 0859   GLUCOSE 173* 05/28/2012 1149   GLUCOSE 220* 05/21/2012 1243   BUN 31* 04/12/2014 0600   BUN 32* 04/11/2014 1200   BUN 41.9* 03/20/2014 0848   BUN 27.8* 12/29/2013 1017   BUN 34* 12/19/2013 0514   BUN 17.9 12/15/2013 0828   CREATININE 1.47* 04/12/2014 0600   CREATININE 1.48* 04/11/2014 1200   CREATININE 1.7* 03/20/2014 0848   CREATININE 1.6* 12/29/2013 1017   CREATININE 1.37* 12/19/2013 0514   CREATININE 1.4* 12/15/2013 0828   CALCIUM 9.1 04/12/2014 0600   CALCIUM 8.9 04/11/2014 1200   CALCIUM 9.2 03/20/2014 0848   CALCIUM 9.5 12/29/2013 1017   CALCIUM 8.9 12/19/2013 0514   CALCIUM 9.2 12/15/2013 0828   GFRNONAA 47* 04/12/2014 0600   GFRNONAA 47* 04/11/2014 1200   GFRNONAA 51* 12/19/2013 0514   GFRAA 55* 04/12/2014 0600   GFRAA 54* 04/11/2014 1200   GFRAA 60* 12/19/2013 0514   CBC    Component Value Date/Time  WBC 4.0 04/11/2014 1200   WBC 4.1 03/20/2014 0848   RBC 3.69* 04/11/2014 1200   RBC 4.00* 03/20/2014 0848   HGB 11.5* 04/11/2014 1200   HGB 12.4* 03/20/2014 0848   HCT 37.2* 04/11/2014 1200   HCT 40.1 03/20/2014 0848   PLT 173 04/11/2014 1200   PLT 170 03/20/2014 0848   MCV 100.8* 04/11/2014 1200   MCV 100.3* 03/20/2014 0848   MCH 31.2 04/11/2014 1200   MCH 31.0 03/20/2014 0848   MCHC 30.9 04/11/2014 1200   MCHC 30.9* 03/20/2014 0848   RDW 17.3* 04/11/2014 1200   RDW 17.1* 03/20/2014 0848   LYMPHSABS 0.7* 03/20/2014 0848   LYMPHSABS 1.0 03/19/2013 1135   MONOABS 0.5 03/20/2014 0848   MONOABS 0.7 03/19/2013 1135   EOSABS 0.0 03/20/2014 0848   EOSABS 0.1 03/19/2013 1135   BASOSABS 0.0 03/20/2014 0848   BASOSABS 0.0  03/19/2013 1135   HEPATIC Function Panel  Recent Labs  12/15/13 0828 12/29/13 1017 03/20/14 0848  PROT 7.4 7.4 7.6   HEMOGLOBIN A1C No components found for: HGA1C,  MPG CARDIAC ENZYMES Lab Results  Component Value Date   CKTOTAL 95 06/28/2010   CKMB 1.4 06/28/2010   TROPONINI <0.03 04/12/2014   TROPONINI <0.03 04/12/2014   TROPONINI <0.03 04/11/2014   BNP  Recent Labs  12/17/13 0530 12/18/13 0605 12/19/13 0514  PROBNP 2944.0* 2665.0* 3054.0*   TSH  Recent Labs  04/11/14 1830  TSH 2.025   CHOLESTEROL No results for input(s): CHOL in the last 8760 hours.  Scheduled Meds: . albuterol  2.5 mg Nebulization Q6H  . aspirin EC  81 mg Oral Daily  . carvedilol  3.125 mg Oral BID WC  . cefTRIAXone (ROCEPHIN)  IV  1 g Intravenous Q24H  . digoxin  0.0625 mg Oral Daily  . furosemide  40 mg Intravenous Q12H  . guaiFENesin  600 mg Oral BID  . heparin  5,000 Units Subcutaneous 3 times per day  . irbesartan  75 mg Oral Daily  . potassium chloride  20 mEq Oral BID  . sodium chloride  3 mL Intravenous Q12H   Continuous Infusions:  PRN Meds:.sodium chloride, acetaminophen, ALPRAZolam, ondansetron (ZOFRAN) IV, sodium chloride, sodium chloride  Assessment/Plan: Acute on chronic hypoxic respiratory failure Acute left heart systolic failure. Possible pneumonia Recurrent non-small cell lung cancer s/p chemotherapy CKD, II Morbid obesity Hypertension Pulmonary fibrosis with hypertension COPD  Continue antibiotic. Awaiting echocardiogram.     LOS: 1 day    Dixie Dials  MD  04/12/2014, 1:03 PM

## 2014-04-13 LAB — BASIC METABOLIC PANEL
ANION GAP: 4 — AB (ref 5–15)
BUN: 40 mg/dL — ABNORMAL HIGH (ref 6–23)
CHLORIDE: 100 mmol/L (ref 96–112)
CO2: 33 mmol/L — AB (ref 19–32)
Calcium: 8.8 mg/dL (ref 8.4–10.5)
Creatinine, Ser: 1.59 mg/dL — ABNORMAL HIGH (ref 0.50–1.35)
GFR calc Af Amer: 50 mL/min — ABNORMAL LOW (ref >90)
GFR calc non Af Amer: 43 mL/min — ABNORMAL LOW (ref >90)
GLUCOSE: 106 mg/dL — AB (ref 70–99)
POTASSIUM: 3.9 mmol/L (ref 3.5–5.1)
Sodium: 137 mmol/L (ref 135–145)

## 2014-04-13 NOTE — Progress Notes (Signed)
Subjective:  Complains of shortness of breath with minimal exertion.  Denies any chest pain.  States coughing is improved.  Objective:  Vital Signs in the last 24 hours: Temp:  [97 F (36.1 C)-97.6 F (36.4 C)] 97 F (36.1 C) (03/14 4163) Pulse Rate:  [50-66] 50 (03/14 0608) Resp:  [17-18] 18 (03/14 0608) BP: (92-100)/(51-56) 92/56 mmHg (03/14 0608) SpO2:  [97 %-99 %] 99 % (03/14 0608) Weight:  [133.448 kg (294 lb 3.2 oz)] 133.448 kg (294 lb 3.2 oz) (03/14 8453)  Intake/Output from previous day: 03/13 0701 - 03/14 0700 In: 1250 [P.O.:1200; IV Piggyback:50] Out: 2525 [Urine:2525] Intake/Output from this shift: Total I/O In: 960 [P.O.:960] Out: -   Physical Exam: Neck: JVD - 8 cm above sternal notch, no adenopathy, no carotid bruit and supple, symmetrical, trachea midline Lungs: bibasilar rales present Heart: regular rate and rhythm, S1, S2 normal and 2/6 systolic murmur noted Abdomen: soft, non-tender; bowel sounds normal; no masses,  no organomegaly Extremities: no clubbing cyanosis 2+ edema  Lab Results:  Recent Labs  04/11/14 1200  WBC 4.0  HGB 11.5*  PLT 173    Recent Labs  04/12/14 0600 04/13/14 0511  NA 138 137  K 3.7 3.9  CL 101 100  CO2 30 33*  GLUCOSE 162* 106*  BUN 31* 40*  CREATININE 1.47* 1.59*    Recent Labs  04/12/14 0030 04/12/14 0600  TROPONINI <0.03 <0.03   Hepatic Function Panel No results for input(s): PROT, ALBUMIN, AST, ALT, ALKPHOS, BILITOT, BILIDIR, IBILI in the last 72 hours. No results for input(s): CHOL in the last 72 hours. No results for input(s): PROTIME in the last 72 hours.  Imaging: Imaging results have been reviewed and No results found.  Cardiac Studies:  Assessment/Plan:  Acute on chronic hypoxic respiratory failure Acute biventricular failure Possible pneumonia Recurrent non-small cell lung cancer s/p chemotherapy CKD, II Morbid obesity Hypertension Pulmonary fibrosis with pulmonary  hypertension COPD Plan Continue present management   LOS: 2 days    [name] N 04/13/2014, 5:44 PM

## 2014-04-13 NOTE — Care Management Note (Unsigned)
    Page 1 of 1   04/15/2014     3:48:32 PM CARE MANAGEMENT NOTE 04/15/2014  Patient:  [name], [name]   Account Number:  [id number]  Date Initiated:  04/13/2014  Documentation initiated by:  Charlii Yost  Subjective/Objective Assessment:   Pt adm on 04/11/14 with SOB, PNA.  PTA, pt resides at home alone and  is independent.  He has supportive son, at bedside.     Action/Plan:   Will follow for dc needs as pt progresses.   Anticipated DC Date:  04/15/2014   Anticipated DC Plan:  [place]  CM consult      Choice offered to / List presented to:             Status of service:  In process, will continue to follow Medicare Important Message given?  YES (If response is "NO", the following Medicare IM given date fields will be blank) Date Medicare IM given:  04/14/2014 Medicare IM given by:  Zhoey Blackstock Date Additional Medicare IM given:   Additional Medicare IM given by:    Discharge Disposition:    Per UR Regulation:  Reviewed for med. necessity/level of care/duration of stay  If discussed at [place] of Stay Meetings, dates discussed:    Comments:  04/15/14 Ellan Lambert, RN, BSN [phone] PT recommending no OP follow up.  Will cont to follow for dc needs.

## 2014-04-13 NOTE — Progress Notes (Signed)
  Echocardiogram 2D Echocardiogram has been performed.  [name] FRANCES 04/13/2014, 8:37 AM

## 2014-04-13 NOTE — Plan of Care (Signed)
Problem: Phase I Progression Outcomes Goal: EF % per last Echo/documented,Core Reminder form on chart Outcome: Completed/Met Date Met:  04/13/14 EF 45-50%(11-2013)

## 2014-04-14 LAB — BASIC METABOLIC PANEL
Anion gap: 5 (ref 5–15)
BUN: 42 mg/dL — ABNORMAL HIGH (ref 6–23)
CO2: 34 mmol/L — AB (ref 19–32)
Calcium: 8.8 mg/dL (ref 8.4–10.5)
Chloride: 99 mmol/L (ref 96–112)
Creatinine, Ser: 1.58 mg/dL — ABNORMAL HIGH (ref 0.50–1.35)
GFR calc Af Amer: 50 mL/min — ABNORMAL LOW (ref >90)
GFR, EST NON AFRICAN AMERICAN: 43 mL/min — AB (ref >90)
GLUCOSE: 89 mg/dL (ref 70–99)
Potassium: 5.4 mmol/L — ABNORMAL HIGH (ref 3.5–5.1)
Sodium: 138 mmol/L (ref 135–145)

## 2014-04-14 MED ORDER — POTASSIUM CHLORIDE CRYS ER 10 MEQ PO TBCR
10.0000 meq | EXTENDED_RELEASE_TABLET | Freq: Once | ORAL | Status: AC
  Administered 2014-04-14: 10 meq via ORAL
  Filled 2014-04-14: qty 1

## 2014-04-14 MED ORDER — DIGOXIN 125 MCG PO TABS
0.1250 mg | ORAL_TABLET | Freq: Every day | ORAL | Status: DC
  Administered 2014-04-15: 0.125 mg via ORAL
  Filled 2014-04-14 (×2): qty 1

## 2014-04-14 NOTE — Progress Notes (Signed)
Dr Terrence Dupont notified this am of pt Potassium level of 5.4.

## 2014-04-14 NOTE — Progress Notes (Signed)
Pt has potassium due tonight, potassium 5.4 this AM. Spoke with Dr. Doylene Canard, new order for 52mEq potassium tonight and d/c current BID potassium order. Will continue to monitor. Ronnette Hila, RN

## 2014-04-14 NOTE — Progress Notes (Signed)
Subjective:  Denies any chest pain states coughing and breathing is slightly improved  Objective:  Vital Signs in the last 24 hours: Temp:  [97.5 F (36.4 C)-98 F (36.7 C)] 97.7 F (36.5 C) (03/15 1005) Pulse Rate:  [53-62] 62 (03/15 1005) Resp:  [18-20] 18 (03/15 1005) BP: (89-107)/(53-63) 89/53 mmHg (03/15 1005) SpO2:  [95 %-98 %] 95 % (03/15 1005) Weight:  [132.677 kg (292 lb 8 oz)] 132.677 kg (292 lb 8 oz) (03/15 0549)  Intake/Output from previous day: 03/14 0701 - 03/15 0700 In: 1460 [P.O.:1440; I.V.:20] Out: 2600 [Urine:2600] Intake/Output from this shift: Total I/O In: 460 [P.O.:460] Out: 900 [Urine:900]  Physical Exam: Neck: no adenopathy, no carotid bruit, no JVD and supple, symmetrical, trachea midline Lungs: Decreased breath sound at bases air entry improved Heart: regular rate and rhythm, S1, S2 normal and 2/6 systolic murmur noted Abdomen: soft, non-tender; bowel sounds normal; no masses,  no organomegaly Extremities: No clubbing cyanosis 1+ edema noted  Lab Results: No results for input(s): WBC, HGB, PLT in the last 72 hours.  Recent Labs  04/13/14 0511 04/14/14 0515  NA 137 138  K 3.9 5.4*  CL 100 99  CO2 33* 34*  GLUCOSE 106* 89  BUN 40* 42*  CREATININE 1.59* 1.58*    Recent Labs  04/12/14 0030 04/12/14 0600  TROPONINI <0.03 <0.03   Hepatic Function Panel No results for input(s): PROT, ALBUMIN, AST, ALT, ALKPHOS, BILITOT, BILIDIR, IBILI in the last 72 hours. No results for input(s): CHOL in the last 72 hours. No results for input(s): PROTIME in the last 72 hours.  Imaging: Imaging results have been reviewed and No results found.  Cardiac Studies:  Assessment/Plan:  Acute on chronic hypoxic respiratory failure Resolving Acute biventricular failure Possible pneumonia Recurrent non-small cell lung cancer s/p chemotherapy CKD, II Morbid obesity Hypertension Pulmonary fibrosis with severe pulmonary hypertension COPD Plan As per  orders   LOS: 3 days    [name] N 04/14/2014, 3:14 PM

## 2014-04-14 NOTE — Progress Notes (Signed)
I cosign all documentation and medication administration for this shift by Nelma Rothman, Student RN.

## 2014-04-15 LAB — BASIC METABOLIC PANEL
Anion gap: 7 (ref 5–15)
BUN: 40 mg/dL — AB (ref 6–23)
CO2: 33 mmol/L — ABNORMAL HIGH (ref 19–32)
CREATININE: 1.53 mg/dL — AB (ref 0.50–1.35)
Calcium: 9.2 mg/dL (ref 8.4–10.5)
Chloride: 98 mmol/L (ref 96–112)
GFR, EST AFRICAN AMERICAN: 52 mL/min — AB (ref >90)
GFR, EST NON AFRICAN AMERICAN: 45 mL/min — AB (ref >90)
Glucose, Bld: 88 mg/dL (ref 70–99)
Potassium: 3.8 mmol/L (ref 3.5–5.1)
SODIUM: 138 mmol/L (ref 135–145)

## 2014-04-15 NOTE — Evaluation (Signed)
Physical Therapy Evaluation Patient Details Name: [name] MRN: [id number] DOB: 05-05-1945 Today's Date: 04/15/2014   History of Present Illness  Patient is a 69 y.o. male admitted with recurrent non-small cell lung cancer diagnosed in October of 2013 has 2 month history of worsening shortness of breath with mild systolic dysfunction. Chest x-ray suggestive of pneumonia. Patient has cough, shortness of breath and bilateral leg edema without fever and chest pain.  Clinical Impression  Patient presents with decreased tolerance to mobility due to deficits listed in PT problem list.  He will benefit from skilled PT to address this issue and prevent any secondary complications or decrease in his independence during this hospitalization.  No follow up PT needs currently.    Follow Up Recommendations No PT follow up    Equipment Recommendations  None recommended by PT    Recommendations for Other Services       Precautions / Restrictions Precautions Precaution Comments: O2 dependent, watch O2 sats      Mobility  Bed Mobility               General bed mobility comments: NT, up in chair  Transfers Overall transfer level: Modified independent Equipment used: None             General transfer comment: stood with increased time from  chair and UE support  Ambulation/Gait Ambulation/Gait assistance: Independent Ambulation Distance (Feet): 160 Feet Assistive device: None Gait Pattern/deviations: Step-through pattern;Shuffle     General Gait Details: generally wider BOS, waddling gait due to LE edema and feet swollen,   Stairs Stairs: Yes Stairs assistance: Supervision Stair Management: Alternating pattern;Forwards;One rail Left Number of Stairs: 3 General stair comments: slower descent, no physical assist needed, just supervision for safety  Wheelchair Mobility    Modified Rankin (Stroke Patients Only)       Balance Overall balance assessment: Modified  Independent                                           Pertinent Vitals/Pain Pain Assessment: No/denies pain    Home Living Family/patient expects to be discharged to:: Private residence Living Arrangements: Alone   Type of Home: House Home Access: Stairs to enter Entrance Stairs-Rails: Psychiatric nurse of Steps: 4 from [place]: One level Home Equipment: Other (comment) (oxygen)      Prior Function Level of Independence: Independent               Hand Dominance   Dominant Hand: Left    Extremity/Trunk Assessment   Upper Extremity Assessment: Overall WFL for tasks assessed           Lower Extremity Assessment: Overall WFL for tasks assessed (with edema both lower legs)         Communication   Communication: No difficulties  Cognition Arousal/Alertness: Awake/alert Behavior During Therapy: WFL for tasks assessed/performed;Flat affect Overall Cognitive Status: Within Functional Limits for tasks assessed                      General Comments General comments (skin integrity, edema, etc.): Educated pt in need to walk daily during hospitalization with nursing assist to avoid losing strength/independence    Exercises        Assessment/Plan    PT Assessment Patient needs continued PT services  PT Diagnosis Generalized weakness   PT Problem  List Decreased strength;Decreased activity tolerance;Cardiopulmonary status limiting activity  PT Treatment Interventions Gait training;Balance training;Therapeutic exercise;Therapeutic activities;Patient/family education;Stair training   PT Goals (Current goals can be found in the Care Plan section) Acute Rehab PT Goals Patient Stated Goal: To go home PT Goal Formulation: With patient Time For Goal Achievement: 04/22/14 Potential to Achieve Goals: Good    Frequency Min 3X/week   Barriers to discharge        Co-evaluation               End of Session  Equipment Utilized During Treatment: Oxygen Activity Tolerance: Patient tolerated treatment well Patient left: in chair;with call bell/phone within reach           Time: 0956-1019 PT Time Calculation (min) (ACUTE ONLY): 23 min   Charges:   PT Evaluation $Initial PT Evaluation Tier I: 1 Procedure PT Treatments $Gait Training: 8-22 mins   PT G Codes:        WYNN,CYNDI 04-27-14, 10:47 AM Magda Kiel, [place] 04-27-14

## 2014-04-15 NOTE — Progress Notes (Signed)
Pt. Ambulating in the room with O2 at 2L, no concerns at this time. Spoke to pt about fluid intake and current weight status. 2-3 + edema to BLE noted.

## 2014-04-15 NOTE — Consult Note (Signed)
Referral received for care management services from inpatient RNCM. Met with the patient and confirmed that her primary MD is not a [facility] [facility] provider and patient does not meet criteria for [facility] program.  Spoke with inpatient RNCM as well to update on status.  Patient was given a [facility] Care Management brochure with contact information.  Please contact for questions: Natividad Brood, RN BSN [facility] Liaison  [phone] business mobile phone

## 2014-04-15 NOTE — Progress Notes (Signed)
Subjective:  Patient denies any chest pain and states not yet ready to go home.  Complains of shortness of breath with minimal exertion.  Objective:  Vital Signs in the last 24 hours: Temp:  [97.5 F (36.4 C)-97.9 F (36.6 C)] 97.6 F (36.4 C) (03/16 1500) Pulse Rate:  [50-129] 90 (03/16 1500) Resp:  [16-18] 16 (03/16 1500) BP: (91-120)/(57-60) 120/60 mmHg (03/16 1500) SpO2:  [81 %-97 %] 94 % (03/16 1500) Weight:  [132.178 kg (291 lb 6.4 oz)] 132.178 kg (291 lb 6.4 oz) (03/16 0555)  Intake/Output from previous day: 03/15 0701 - 03/16 0700 In: 1360 [P.O.:1360] Out: 2825 [Urine:2825] Intake/Output from this shift: Total I/O In: 600 [P.O.:600] Out: 1350 [Urine:1350]  Physical Exam: Neck: no adenopathy, no carotid bruit, no JVD and supple, symmetrical, trachea midline Lungs: decreased breath sounds at bases Heart: regular rate and rhythm, S1, S2 normal and 2/6 systolic murmur noted Abdomen: soft, non-tender; bowel sounds normal; no masses,  no organomegaly Extremities: no clubbing cyanosis 1+ edema noted  Lab Results: No results for input(s): WBC, HGB, PLT in the last 72 hours.  Recent Labs  04/14/14 0515 04/15/14 0604  NA 138 138  K 5.4* 3.8  CL 99 98  CO2 34* 33*  GLUCOSE 89 88  BUN 42* 40*  CREATININE 1.58* 1.53*   No results for input(s): TROPONINI in the last 72 hours.  Invalid input(s): CK, MB Hepatic Function Panel No results for input(s): PROT, ALBUMIN, AST, ALT, ALKPHOS, BILITOT, BILIDIR, IBILI in the last 72 hours. No results for input(s): CHOL in the last 72 hours. No results for input(s): PROTIME in the last 72 hours.  Imaging: Imaging results have been reviewed and No results found.  Cardiac Studies:  Assessment/Plan:   chronic hypoxic respiratory failure Resolving Acute biventricular failure Possible pneumonia Recurrent non-small cell lung cancer s/p chemotherapy CKD, II Morbid obesity Hypertension Pulmonary fibrosis with severe pulmonary  hypertension COPD Plan Continue present management Increase ambulation Possible discharge tomorrow if stable  LOS: 4 days    [name] N 04/15/2014, 5:10 PM

## 2014-04-15 NOTE — Progress Notes (Signed)
I cosign all documentation and medication administration for this shift by Nelma Rothman, Student RN.

## 2014-04-16 MED ORDER — DIGOXIN 125 MCG PO TABS: 0.1250 mg | ORAL_TABLET | Freq: Every day | ORAL | Status: DC

## 2014-04-16 MED ORDER — HEPARIN SOD (PORK) LOCK FLUSH 100 UNIT/ML IV SOLN
500.0000 [IU] | INTRAVENOUS | Status: AC | PRN
  Administered 2014-04-16: 500 [IU]

## 2014-04-16 MED ORDER — FUROSEMIDE 10 MG/ML IJ SOLN
40.0000 mg | Freq: Two times a day (BID) | INTRAMUSCULAR | Status: DC
  Administered 2014-04-16: 40 mg via INTRAVENOUS
  Filled 2014-04-16 (×2): qty 4

## 2014-04-16 MED ORDER — LEVOFLOXACIN 750 MG PO TABS: 750.0000 mg | ORAL_TABLET | Freq: Every day | ORAL | Status: DC

## 2014-04-16 NOTE — Discharge Instructions (Signed)

## 2014-04-16 NOTE — Discharge Summary (Signed)
Discharge summary dictated on 04/16/2014 dictation number is [phone]

## 2014-04-17 NOTE — Discharge Summary (Signed)
[name], [name] NO.:  [id number]  MEDICAL RECORD NO.:  [id number]  LOCATION:  3E15C                        FACILITY:  [facility]  PHYSICIAN:  [name]. Terrence Dupont, M.D. DATE OF BIRTH:  December 22, 1945  DATE OF ADMISSION:  04/11/2014 DATE OF DISCHARGE:  04/16/2014                              DISCHARGE SUMMARY   ADMITTING DIAGNOSES: 1. Acute on chronic hypoxic respiratory failure, rule out acute left     heart systolic failure. 2. Possible left lower lobe pneumonia. 3. Recurrent non-small cell cancer of the left lung, status post     chemotherapy. 4. Chronic kidney disease, stage 2. 5. Morbid obesity. 6. Hypertension. 7. Pulmonary fibrosis with hypertension. 8. Chronic obstructive pulmonary disease.  DISCHARGE DIAGNOSES: 1. Status post chronic hypoxic respiratory failure. 2. Compensated right and left heart failure. 3. Resolving left lower lobe pneumonia. 4. Non-small cell cancer of the lung, status post chemotherapy. 5. Chronic kidney disease, stage 2. 6. Morbid obesity. 7. Hypertension. 8. Pulmonary fibrosis with severe pulmonary hypertension. 9. Chronic obstructive pulmonary disease.  DISCHARGE HOME MEDICATIONS: 1. Levaquin 750 mg 1 tablet daily for 5 days. 2. Aspirin 81 mg 1 tablet daily. 3. Symbicort 2 puffs twice daily as before. 4. Lasix 80 mg twice daily. 5. Xopenex inhaler 1-2 puffs every 6 hours. 6. Zaroxolyn 1 tablet 5 mg daily. 7. Metoprolol 12.5 mg half tablet twice daily. 8. Potassium chloride 20 mEq twice daily. 9. Spiriva 18 mcg 1 inhaler daily. 10.Valsartan 160 mg daily. 11.Digoxin 0.125 mg daily.  DIET:  Low-salt, low-cholesterol, weight reducing diet.  FOLLOWUP:  Follow up with me in 1 week.  Follow up with Dr. Julien Nordmann, Oncology and [place], Pulmonary as scheduled.  CONDITION AT DISCHARGE:  Stable.  BRIEF HISTORY AND HOSPITAL COURSE:  [name] is a 69 year old male with 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  diagnosed in October of 2013, came to the ER with 2 month history of worsening shortness of breath with minimal exertion.  Chest x-ray was suggestive of pneumonia. The patient has cough, shortness of breath, and bilateral leg edema without fever and chest pain.  He was advised to drink extra fluids by Oncology Nurse due to renal insufficiency and decreased fluids due to leg edema.  He was advised to drink extra fluid by Oncology Nurse with progressive worsening shortness of breath.  PHYSICAL EXAMINATION:  GENERAL:  He was alert, awake, oriented. VITAL SIGNS:  Blood pressure was 108/62, pulse was 54.  He was afebrile. EYES:  Conjunctivae were pink. NECK:  Supple.  Positive JVD. LUNGS:  He has decreased breath sounds at bases with bilateral expiratory wheezing and bibasilar rales. CARDIOVASCULAR:  S1, S2 was normal.  There was 2/6 systolic murmur. ABDOMEN:  Soft, obese, distended. EXTREMITIES:  There was 3+ edema in the left more than the right. NEUROLOGIC:  Grossly intact.  LABORATORY DATA:  His proBNP was 679, sodium was 138, potassium 3.7, BUN 31, creatinine 1.47.  Four sets of troponin-I were normal.  Days level was 1.2.  EKG showed marked sinus bradycardia.  Nonspecific ST-wave changes.  A 2D echo showed normal LV systolic function.  RV was moderately dilated with systolic function was also  moderately reduced and the PA pressures were markedly elevated.  Chest x-ray showed cardiac enlargement and pulmonary fibrosis with superimposed pneumonia at the left lung base.  BRIEF HOSPITAL COURSE:  The patient was admitted to telemetry unit and was started on IV Rocephin and continued on home medications with improvement in his symptoms.  The patient remained afebrile during the hospital stay.  The patient did not have any episodes of chest pain during the hospital stay.  His breathing has gradually improved.  His IV antibiotics has been switched to Levaquin p.o.  The patient  was discussed at length regarding lifestyle changes and weight reduction. The patient also was advised to comply and follow up with Pulmonary and Oncology as scheduled.  The patient will be followed up in my office in 1 week.  The patient was discharged in stable condition.     [name]. Terrence Dupont, M.D.     MNH/MEDQ  D:  04/16/2014  T:  04/17/2014  Job:  [id number]

## 2014-04-27 ENCOUNTER — Emergency Department (HOSPITAL_COMMUNITY): Payer: Medicare HMO

## 2014-04-27 ENCOUNTER — Inpatient Hospital Stay (HOSPITAL_COMMUNITY)
Admission: EM | Admit: 2014-04-27 | Discharge: 2014-05-01 | DRG: 291 | Disposition: A | Discharge status: Home / Self Care | Payer: Medicare HMO | Attending: Cardiology | Admitting: Cardiology

## 2014-04-27 ENCOUNTER — Encounter (HOSPITAL_COMMUNITY): Payer: Self-pay | Admitting: Emergency Medicine

## 2014-04-27 DIAGNOSIS — M1990 Unspecified osteoarthritis, unspecified site: Secondary | ICD-10-CM | POA: Diagnosis present

## 2014-04-27 DIAGNOSIS — Z85118 Personal history of other malignant neoplasm of bronchus and lung: Secondary | ICD-10-CM

## 2014-04-27 DIAGNOSIS — I509 Heart failure, unspecified: Secondary | ICD-10-CM | POA: Diagnosis not present

## 2014-04-27 DIAGNOSIS — E785 Hyperlipidemia, unspecified: Secondary | ICD-10-CM | POA: Diagnosis present

## 2014-04-27 DIAGNOSIS — G4700 Insomnia, unspecified: Secondary | ICD-10-CM | POA: Diagnosis present

## 2014-04-27 DIAGNOSIS — R0602 Shortness of breath: Secondary | ICD-10-CM | POA: Diagnosis not present

## 2014-04-27 DIAGNOSIS — I129 Hypertensive chronic kidney disease with stage 1 through stage 4 chronic kidney disease, or unspecified chronic kidney disease: Secondary | ICD-10-CM | POA: Diagnosis present

## 2014-04-27 DIAGNOSIS — G4733 Obstructive sleep apnea (adult) (pediatric): Secondary | ICD-10-CM | POA: Diagnosis present

## 2014-04-27 DIAGNOSIS — I872 Venous insufficiency (chronic) (peripheral): Secondary | ICD-10-CM | POA: Diagnosis present

## 2014-04-27 DIAGNOSIS — I5082 Biventricular heart failure: Secondary | ICD-10-CM

## 2014-04-27 DIAGNOSIS — I272 Other secondary pulmonary hypertension: Secondary | ICD-10-CM | POA: Diagnosis present

## 2014-04-27 DIAGNOSIS — J449 Chronic obstructive pulmonary disease, unspecified: Secondary | ICD-10-CM | POA: Diagnosis present

## 2014-04-27 DIAGNOSIS — J8410 Pulmonary fibrosis, unspecified: Secondary | ICD-10-CM | POA: Diagnosis present

## 2014-04-27 DIAGNOSIS — J9621 Acute and chronic respiratory failure with hypoxia: Secondary | ICD-10-CM | POA: Diagnosis present

## 2014-04-27 DIAGNOSIS — Z6841 Body Mass Index (BMI) 40.0 and over, adult: Secondary | ICD-10-CM

## 2014-04-27 DIAGNOSIS — N182 Chronic kidney disease, stage 2 (mild): Secondary | ICD-10-CM | POA: Diagnosis present

## 2014-04-27 DIAGNOSIS — Z87891 Personal history of nicotine dependence: Secondary | ICD-10-CM

## 2014-04-27 DIAGNOSIS — N179 Acute kidney failure, unspecified: Secondary | ICD-10-CM | POA: Diagnosis present

## 2014-04-27 DIAGNOSIS — I959 Hypotension, unspecified: Secondary | ICD-10-CM | POA: Diagnosis present

## 2014-04-27 DIAGNOSIS — Z87442 Personal history of urinary calculi: Secondary | ICD-10-CM

## 2014-04-27 HISTORY — DX: Heart failure, unspecified: I50.9

## 2014-04-27 LAB — I-STAT CHEM 8, ED
BUN: 43 mg/dL — ABNORMAL HIGH (ref 6–23)
CHLORIDE: 104 mmol/L (ref 96–112)
Calcium, Ion: 1.11 mmol/L — ABNORMAL LOW (ref 1.13–1.30)
Creatinine, Ser: 2 mg/dL — ABNORMAL HIGH (ref 0.50–1.35)
Glucose, Bld: 90 mg/dL (ref 70–99)
HEMATOCRIT: 38 % — AB (ref 39.0–52.0)
HEMOGLOBIN: 12.9 g/dL — AB (ref 13.0–17.0)
POTASSIUM: 4.1 mmol/L (ref 3.5–5.1)
Sodium: 140 mmol/L (ref 135–145)
TCO2: 22 mmol/L (ref 0–100)

## 2014-04-27 LAB — CBC WITH DIFFERENTIAL/PLATELET
Basophils Absolute: 0 10*3/uL (ref 0.0–0.1)
Basophils Relative: 0 % (ref 0–1)
EOS PCT: 1 % (ref 0–5)
Eosinophils Absolute: 0 10*3/uL (ref 0.0–0.7)
HCT: 37.5 % — ABNORMAL LOW (ref 39.0–52.0)
Hemoglobin: 11.7 g/dL — ABNORMAL LOW (ref 13.0–17.0)
LYMPHS ABS: 0.7 10*3/uL (ref 0.7–4.0)
Lymphocytes Relative: 18 % (ref 12–46)
MCH: 31 pg (ref 26.0–34.0)
MCHC: 31.2 g/dL (ref 30.0–36.0)
MCV: 99.2 fL (ref 78.0–100.0)
MONOS PCT: 14 % — AB (ref 3–12)
Monocytes Absolute: 0.5 10*3/uL (ref 0.1–1.0)
NEUTROS PCT: 67 % (ref 43–77)
Neutro Abs: 2.6 10*3/uL (ref 1.7–7.7)
Platelets: 160 10*3/uL (ref 150–400)
RBC: 3.78 MIL/uL — AB (ref 4.22–5.81)
RDW: 17.6 % — ABNORMAL HIGH (ref 11.5–15.5)
WBC: 3.8 10*3/uL — ABNORMAL LOW (ref 4.0–10.5)

## 2014-04-27 LAB — BASIC METABOLIC PANEL
ANION GAP: 5 (ref 5–15)
BUN: 39 mg/dL — AB (ref 6–23)
CHLORIDE: 104 mmol/L (ref 96–112)
CO2: 28 mmol/L (ref 19–32)
Calcium: 8.8 mg/dL (ref 8.4–10.5)
Creatinine, Ser: 1.94 mg/dL — ABNORMAL HIGH (ref 0.50–1.35)
GFR, EST AFRICAN AMERICAN: 39 mL/min — AB (ref >90)
GFR, EST NON AFRICAN AMERICAN: 34 mL/min — AB (ref >90)
Glucose, Bld: 91 mg/dL (ref 70–99)
Potassium: 4.1 mmol/L (ref 3.5–5.1)
SODIUM: 137 mmol/L (ref 135–145)

## 2014-04-27 LAB — BRAIN NATRIURETIC PEPTIDE: B Natriuretic Peptide: 889.9 pg/mL — ABNORMAL HIGH (ref 0.0–100.0)

## 2014-04-27 MED ORDER — FUROSEMIDE 10 MG/ML IJ SOLN
80.0000 mg | Freq: Once | INTRAMUSCULAR | Status: AC
  Administered 2014-04-27: 80 mg via INTRAVENOUS
  Filled 2014-04-27: qty 8

## 2014-04-27 NOTE — ED Notes (Signed)
Pt placed on 2L [place]
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
## 2014-04-27 NOTE — ED Notes (Signed)
Pt to ED with c/o shortness of breath.  St's he was recently in the hosp. For same.  St's after going home he is not any better.  Pt's 02 sats on R/A in the 80's.
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
## 2014-04-27 NOTE — ED Notes (Signed)
Pt placed on 15 L NRB

## 2014-04-27 NOTE — ED Notes (Signed)
Pt refusing lasix at this time. Dr. Regenia Skeeter notified

## 2014-04-28 ENCOUNTER — Encounter (HOSPITAL_COMMUNITY): Payer: Self-pay | Admitting: General Practice

## 2014-04-28 DIAGNOSIS — R0602 Shortness of breath: Secondary | ICD-10-CM | POA: Diagnosis present

## 2014-04-28 DIAGNOSIS — M1990 Unspecified osteoarthritis, unspecified site: Secondary | ICD-10-CM | POA: Diagnosis present

## 2014-04-28 DIAGNOSIS — J8410 Pulmonary fibrosis, unspecified: Secondary | ICD-10-CM | POA: Diagnosis present

## 2014-04-28 DIAGNOSIS — Z87891 Personal history of nicotine dependence: Secondary | ICD-10-CM | POA: Diagnosis not present

## 2014-04-28 DIAGNOSIS — Z85118 Personal history of other malignant neoplasm of bronchus and lung: Secondary | ICD-10-CM | POA: Diagnosis not present

## 2014-04-28 DIAGNOSIS — I129 Hypertensive chronic kidney disease with stage 1 through stage 4 chronic kidney disease, or unspecified chronic kidney disease: Secondary | ICD-10-CM | POA: Diagnosis present

## 2014-04-28 DIAGNOSIS — I959 Hypotension, unspecified: Secondary | ICD-10-CM | POA: Diagnosis present

## 2014-04-28 DIAGNOSIS — G4733 Obstructive sleep apnea (adult) (pediatric): Secondary | ICD-10-CM | POA: Diagnosis present

## 2014-04-28 DIAGNOSIS — Z87442 Personal history of urinary calculi: Secondary | ICD-10-CM | POA: Diagnosis not present

## 2014-04-28 DIAGNOSIS — Z6841 Body Mass Index (BMI) 40.0 and over, adult: Secondary | ICD-10-CM | POA: Diagnosis not present

## 2014-04-28 DIAGNOSIS — I272 Other secondary pulmonary hypertension: Secondary | ICD-10-CM | POA: Diagnosis present

## 2014-04-28 DIAGNOSIS — I509 Heart failure, unspecified: Secondary | ICD-10-CM | POA: Diagnosis present

## 2014-04-28 DIAGNOSIS — E785 Hyperlipidemia, unspecified: Secondary | ICD-10-CM | POA: Diagnosis present

## 2014-04-28 DIAGNOSIS — G4700 Insomnia, unspecified: Secondary | ICD-10-CM | POA: Diagnosis present

## 2014-04-28 DIAGNOSIS — J9621 Acute and chronic respiratory failure with hypoxia: Secondary | ICD-10-CM | POA: Diagnosis present

## 2014-04-28 DIAGNOSIS — J449 Chronic obstructive pulmonary disease, unspecified: Secondary | ICD-10-CM | POA: Diagnosis present

## 2014-04-28 DIAGNOSIS — N182 Chronic kidney disease, stage 2 (mild): Secondary | ICD-10-CM | POA: Diagnosis present

## 2014-04-28 DIAGNOSIS — N179 Acute kidney failure, unspecified: Secondary | ICD-10-CM | POA: Diagnosis present

## 2014-04-28 DIAGNOSIS — I872 Venous insufficiency (chronic) (peripheral): Secondary | ICD-10-CM | POA: Diagnosis present

## 2014-04-28 LAB — BASIC METABOLIC PANEL
Anion gap: 9 (ref 5–15)
BUN: 34 mg/dL — ABNORMAL HIGH (ref 6–23)
CO2: 27 mmol/L (ref 19–32)
Calcium: 9.2 mg/dL (ref 8.4–10.5)
Chloride: 101 mmol/L (ref 96–112)
Creatinine, Ser: 1.9 mg/dL — ABNORMAL HIGH (ref 0.50–1.35)
GFR calc non Af Amer: 35 mL/min — ABNORMAL LOW (ref >90)
GFR, EST AFRICAN AMERICAN: 40 mL/min — AB (ref >90)
GLUCOSE: 87 mg/dL (ref 70–99)
Potassium: 3.5 mmol/L (ref 3.5–5.1)
SODIUM: 137 mmol/L (ref 135–145)

## 2014-04-28 LAB — DIGOXIN LEVEL: DIGOXIN LVL: 0.8 ng/mL (ref 0.8–2.0)

## 2014-04-28 LAB — CBC
HCT: 37 % — ABNORMAL LOW (ref 39.0–52.0)
Hemoglobin: 11.4 g/dL — ABNORMAL LOW (ref 13.0–17.0)
MCH: 30.6 pg (ref 26.0–34.0)
MCHC: 30.8 g/dL (ref 30.0–36.0)
MCV: 99.5 fL (ref 78.0–100.0)
Platelets: 152 10*3/uL (ref 150–400)
RBC: 3.72 MIL/uL — ABNORMAL LOW (ref 4.22–5.81)
RDW: 17.7 % — AB (ref 11.5–15.5)
WBC: 3.8 10*3/uL — ABNORMAL LOW (ref 4.0–10.5)

## 2014-04-28 LAB — CREATININE, SERUM
Creatinine, Ser: 1.89 mg/dL — ABNORMAL HIGH (ref 0.50–1.35)
GFR, EST AFRICAN AMERICAN: 40 mL/min — AB (ref >90)
GFR, EST NON AFRICAN AMERICAN: 35 mL/min — AB (ref >90)

## 2014-04-28 LAB — BRAIN NATRIURETIC PEPTIDE: B NATRIURETIC PEPTIDE 5: 838.2 pg/mL — AB (ref 0.0–100.0)

## 2014-04-28 MED ORDER — DIGOXIN 125 MCG PO TABS
0.1250 mg | ORAL_TABLET | Freq: Every day | ORAL | Status: DC
  Administered 2014-04-29 – 2014-05-01 (×3): 0.125 mg via ORAL
  Filled 2014-04-28 (×4): qty 1

## 2014-04-28 MED ORDER — SODIUM CHLORIDE 0.9 % IJ SOLN
10.0000 mL | INTRAMUSCULAR | Status: DC | PRN
  Administered 2014-04-28: 10 mL
  Administered 2014-04-29: 20 mL
  Administered 2014-04-29 – 2014-05-01 (×4): 10 mL
  Filled 2014-04-28 (×6): qty 40

## 2014-04-28 MED ORDER — SODIUM CHLORIDE 0.9 % IJ SOLN: 3.0000 mL | INTRAMUSCULAR | Status: DC | PRN

## 2014-04-28 MED ORDER — ONDANSETRON HCL 4 MG/2ML IJ SOLN: 4.0000 mg | Freq: Four times a day (QID) | INTRAMUSCULAR | Status: DC | PRN

## 2014-04-28 MED ORDER — SODIUM CHLORIDE 0.9 % IJ SOLN
3.0000 mL | Freq: Two times a day (BID) | INTRAMUSCULAR | Status: DC
Start: 2014-04-28 — End: 2014-05-01
  Administered 2014-04-28 – 2014-05-01 (×4): 3 mL via INTRAVENOUS

## 2014-04-28 MED ORDER — FUROSEMIDE 10 MG/ML IJ SOLN
40.0000 mg | Freq: Two times a day (BID) | INTRAMUSCULAR | Status: DC
  Administered 2014-04-28 – 2014-04-30 (×5): 40 mg via INTRAVENOUS
  Filled 2014-04-28 (×5): qty 4

## 2014-04-28 MED ORDER — METOLAZONE 5 MG PO TABS
5.0000 mg | ORAL_TABLET | Freq: Every day | ORAL | Status: DC
  Administered 2014-04-28 – 2014-05-01 (×4): 5 mg via ORAL
  Filled 2014-04-28 (×4): qty 1

## 2014-04-28 MED ORDER — ALBUTEROL SULFATE (2.5 MG/3ML) 0.083% IN NEBU: 3.0000 mL | INHALATION_SOLUTION | Freq: Four times a day (QID) | RESPIRATORY_TRACT | Status: DC | PRN

## 2014-04-28 MED ORDER — TIOTROPIUM BROMIDE MONOHYDRATE 18 MCG IN CAPS
18.0000 ug | ORAL_CAPSULE | Freq: Every day | RESPIRATORY_TRACT | Status: DC
  Administered 2014-04-28 – 2014-05-01 (×4): 18 ug via RESPIRATORY_TRACT
  Filled 2014-04-28 (×2): qty 5

## 2014-04-28 MED ORDER — FUROSEMIDE 10 MG/ML IJ SOLN
40.0000 mg | Freq: Two times a day (BID) | INTRAMUSCULAR | Status: DC
  Filled 2014-04-28 (×2): qty 4

## 2014-04-28 MED ORDER — POTASSIUM CHLORIDE CRYS ER 20 MEQ PO TBCR
20.0000 meq | EXTENDED_RELEASE_TABLET | Freq: Two times a day (BID) | ORAL | Status: DC
  Administered 2014-04-28 – 2014-04-29 (×4): 20 meq via ORAL
  Filled 2014-04-28 (×5): qty 1

## 2014-04-28 MED ORDER — SODIUM CHLORIDE 0.9 % IV SOLN
250.0000 mL | INTRAVENOUS | Status: DC | PRN
  Administered 2014-04-28: 250 mL via INTRAVENOUS

## 2014-04-28 MED ORDER — ASPIRIN EC 81 MG PO TBEC
81.0000 mg | DELAYED_RELEASE_TABLET | Freq: Every day | ORAL | Status: DC
Start: 2014-04-28 — End: 2014-05-01
  Administered 2014-04-28 – 2014-05-01 (×4): 81 mg via ORAL
  Filled 2014-04-28 (×4): qty 1

## 2014-04-28 MED ORDER — ACETAMINOPHEN 325 MG PO TABS: 650.0000 mg | ORAL_TABLET | ORAL | Status: DC | PRN

## 2014-04-28 MED ORDER — HEPARIN SODIUM (PORCINE) 5000 UNIT/ML IJ SOLN
5000.0000 [IU] | Freq: Three times a day (TID) | INTRAMUSCULAR | Status: DC
  Administered 2014-04-28 – 2014-05-01 (×11): 5000 [IU] via SUBCUTANEOUS
  Filled 2014-04-28 (×12): qty 1

## 2014-04-28 MED ORDER — BUDESONIDE-FORMOTEROL FUMARATE 160-4.5 MCG/ACT IN AERO
2.0000 | INHALATION_SPRAY | Freq: Two times a day (BID) | RESPIRATORY_TRACT | Status: DC
  Administered 2014-04-28 – 2014-05-01 (×7): 2 via RESPIRATORY_TRACT
  Filled 2014-04-28 (×2): qty 6

## 2014-04-28 NOTE — Progress Notes (Signed)
I cosign Komicia's medication administration and documentation.
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
## 2014-04-28 NOTE — Progress Notes (Signed)
UR completed 

## 2014-04-28 NOTE — Care Management Note (Unsigned)
    Page 1 of 1   04/30/2014     1:57:04 PM CARE MANAGEMENT NOTE 04/30/2014  Patient:  [name], [name]   Account Number:  [id number]  Date Initiated:  04/28/2014  Documentation initiated by:  Rhemi Balbach  Subjective/Objective Assessment:   Pt adm on 04/27/14 with CHF exacerbation.   PTA, pt lives alone and is independent.     Action/Plan:   Will follow for dc needs as pt progresses.   Anticipated DC Date:  05/02/2014   Anticipated DC Plan:  [place]  CM consult      Choice offered to / List presented to:             Status of service:  In process, will continue to follow Medicare Important Message given?  YES (If response is "NO", the following Medicare IM given date fields will be blank) Date Medicare IM given:  04/30/2014 Medicare IM given by:  Falynn Ailey Date Additional Medicare IM given:   Additional Medicare IM given by:    Discharge Disposition:    Per UR Regulation:  Reviewed for med. necessity/level of care/duration of stay  If discussed at [place] of Stay Meetings, dates discussed:    Comments:  04/30/14 Ellan Lambert, RN, BSN [phone] PT recommending no OP follow up at dc.  Will cont to follow progress.

## 2014-04-29 ENCOUNTER — Encounter (HOSPITAL_COMMUNITY): Payer: Self-pay | Admitting: General Practice

## 2014-04-29 LAB — BASIC METABOLIC PANEL
Anion gap: 3 — ABNORMAL LOW (ref 5–15)
BUN: 39 mg/dL — AB (ref 6–23)
CHLORIDE: 102 mmol/L (ref 96–112)
CO2: 34 mmol/L — AB (ref 19–32)
CREATININE: 1.83 mg/dL — AB (ref 0.50–1.35)
Calcium: 8.9 mg/dL (ref 8.4–10.5)
GFR, EST AFRICAN AMERICAN: 42 mL/min — AB (ref >90)
GFR, EST NON AFRICAN AMERICAN: 36 mL/min — AB (ref >90)
Glucose, Bld: 89 mg/dL (ref 70–99)
Potassium: 3.6 mmol/L (ref 3.5–5.1)
Sodium: 139 mmol/L (ref 135–145)

## 2014-04-29 LAB — BRAIN NATRIURETIC PEPTIDE: B Natriuretic Peptide: 607.4 pg/mL — ABNORMAL HIGH (ref 0.0–100.0)

## 2014-04-29 MED ORDER — POTASSIUM CHLORIDE CRYS ER 20 MEQ PO TBCR
20.0000 meq | EXTENDED_RELEASE_TABLET | Freq: Three times a day (TID) | ORAL | Status: DC
  Administered 2014-04-29 – 2014-05-01 (×6): 20 meq via ORAL
  Filled 2014-04-29 (×7): qty 1
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
## 2014-04-30 ENCOUNTER — Inpatient Hospital Stay (HOSPITAL_COMMUNITY): Payer: Medicare HMO

## 2014-04-30 LAB — BASIC METABOLIC PANEL
Anion gap: 11 (ref 5–15)
BUN: 40 mg/dL — ABNORMAL HIGH (ref 6–23)
CO2: 28 mmol/L (ref 19–32)
Calcium: 9 mg/dL (ref 8.4–10.5)
Chloride: 98 mmol/L (ref 96–112)
Creatinine, Ser: 1.76 mg/dL — ABNORMAL HIGH (ref 0.50–1.35)
GFR calc Af Amer: 44 mL/min — ABNORMAL LOW (ref >90)
GFR, EST NON AFRICAN AMERICAN: 38 mL/min — AB (ref >90)
GLUCOSE: 92 mg/dL (ref 70–99)
POTASSIUM: 3.6 mmol/L (ref 3.5–5.1)
Sodium: 137 mmol/L (ref 135–145)

## 2014-04-30 LAB — BRAIN NATRIURETIC PEPTIDE: B Natriuretic Peptide: 681.5 pg/mL — ABNORMAL HIGH (ref 0.0–100.0)

## 2014-04-30 LAB — MAGNESIUM: Magnesium: 1.8 mg/dL (ref 1.5–2.5)

## 2014-04-30 MED ORDER — FUROSEMIDE 10 MG/ML IJ SOLN
60.0000 mg | Freq: Two times a day (BID) | INTRAMUSCULAR | Status: DC
  Administered 2014-04-30 – 2014-05-01 (×2): 60 mg via INTRAVENOUS
  Filled 2014-04-30 (×2): qty 6
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
## 2014-05-01 MED ORDER — HEPARIN SOD (PORK) LOCK FLUSH 100 UNIT/ML IV SOLN
500.0000 [IU] | INTRAVENOUS | Status: AC | PRN
  Administered 2014-05-01: 500 [IU]

## 2014-05-01 MED ORDER — CETYLPYRIDINIUM CHLORIDE 0.05 % MT LIQD
7.0000 mL | Freq: Two times a day (BID) | OROMUCOSAL | Status: DC
  Administered 2014-05-01: 7 mL via OROMUCOSAL

## 2014-05-01 MED ORDER — TORSEMIDE 20 MG PO TABS: 20.0000 mg | ORAL_TABLET | Freq: Two times a day (BID) | ORAL | Status: DC

## 2014-05-01 NOTE — Discharge Instructions (Signed)

## 2014-05-01 NOTE — Progress Notes (Signed)
At 1527 pt d/c home all port-a-cath de-accessed.  Karie Kirks, RN

## 2014-05-01 NOTE — Clinical Documentation Improvement (Signed)
Current documentation reflects "acute on chronic biventricular failure".  Please further clarify the biventricular failure and document in your progress notes and carry over to the discharge summary.    Possible Clinical Conditions: -Acute on chronic diastolic heart failure -Acute on chronic systolic heart failure -Acute on chronic combined diastolic and systolic heart failure  Thank you, Mateo Flow, RN [phone] Clinical Documentation Specialist

## 2014-05-01 NOTE — Progress Notes (Signed)
Patient given discharge instructions and all questions answered.  

## 2014-05-01 NOTE — Discharge Summary (Signed)
Discharge summary dictated on 05/01/2014 dictation number is [id number]
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
## 2014-05-04 ENCOUNTER — Telehealth (HOSPITAL_COMMUNITY): Payer: Self-pay | Admitting: Surgery

## 2014-05-04 NOTE — Telephone Encounter (Signed)
HF Nurse Navigator Post -Discharge Telephone Call  I called [name] to see how things were going since his recent discharge.  He reports that he is "doing fine".  He says that he has been weighing daily and that his weights have been "about the same" as his discharge weight.  He tells me he has all of his medications and has been taking them as prescribed.  He also says that he been avoiding high salt food and adhering "as best he can" to a low sodium diet.  He has a follow-up appt with Harwani on Wednesday of this week.  I encouraged him to call back with any further questions related to his HF.

## 2014-06-22 ENCOUNTER — Ambulatory Visit (HOSPITAL_COMMUNITY)
Admission: RE | Admit: 2014-06-22 | Discharge: 2014-06-22 | Disposition: A | Discharge status: Home / Self Care | Payer: Medicare HMO | Source: Ambulatory Visit | Attending: Internal Medicine | Admitting: Internal Medicine

## 2014-06-22 ENCOUNTER — Other Ambulatory Visit (HOSPITAL_BASED_OUTPATIENT_CLINIC_OR_DEPARTMENT_OTHER): Payer: Medicare HMO

## 2014-06-22 DIAGNOSIS — C3431 Malignant neoplasm of lower lobe, right bronchus or lung: Secondary | ICD-10-CM | POA: Diagnosis not present

## 2014-06-22 DIAGNOSIS — C3432 Malignant neoplasm of lower lobe, left bronchus or lung: Secondary | ICD-10-CM | POA: Diagnosis not present

## 2014-06-22 LAB — CBC WITH DIFFERENTIAL/PLATELET
BASO%: 0.3 % (ref 0.0–2.0)
Basophils Absolute: 0 10*3/uL (ref 0.0–0.1)
EOS%: 0.8 % (ref 0.0–7.0)
Eosinophils Absolute: 0 10*3/uL (ref 0.0–0.5)
HCT: 38.3 % — ABNORMAL LOW (ref 38.4–49.9)
HGB: 12.2 g/dL — ABNORMAL LOW (ref 13.0–17.1)
LYMPH#: 0.6 10*3/uL — AB (ref 0.9–3.3)
LYMPH%: 15.1 % (ref 14.0–49.0)
MCH: 31.6 pg (ref 27.2–33.4)
MCHC: 31.9 g/dL — ABNORMAL LOW (ref 32.0–36.0)
MCV: 99.2 fL — AB (ref 79.3–98.0)
MONO#: 0.6 10*3/uL (ref 0.1–0.9)
MONO%: 15.7 % — ABNORMAL HIGH (ref 0.0–14.0)
NEUT%: 68.1 % (ref 39.0–75.0)
NEUTROS ABS: 2.6 10*3/uL (ref 1.5–6.5)
NRBC: 0 % (ref 0–0)
Platelets: 148 10*3/uL (ref 140–400)
RBC: 3.86 10*6/uL — AB (ref 4.20–5.82)
RDW: 16.5 % — AB (ref 11.0–14.6)
WBC: 3.8 10*3/uL — AB (ref 4.0–10.3)

## 2014-06-22 LAB — COMPREHENSIVE METABOLIC PANEL (CC13)
ALT: 8 U/L (ref 0–55)
AST: 25 U/L (ref 5–34)
Albumin: 3.6 g/dL (ref 3.5–5.0)
Alkaline Phosphatase: 77 U/L (ref 40–150)
Anion Gap: 15 mEq/L — ABNORMAL HIGH (ref 3–11)
BUN: 37.1 mg/dL — AB (ref 7.0–26.0)
CO2: 23 meq/L (ref 22–29)
CREATININE: 1.6 mg/dL — AB (ref 0.7–1.3)
Calcium: 8.8 mg/dL (ref 8.4–10.4)
Chloride: 103 mEq/L (ref 98–109)
EGFR: 51 mL/min/{1.73_m2} — ABNORMAL LOW (ref >90)
Glucose: 88 mg/dl (ref 70–140)
Potassium: 3.9 mEq/L (ref 3.5–5.1)
Sodium: 141 mEq/L (ref 136–145)
TOTAL PROTEIN: 7.5 g/dL (ref 6.4–8.3)
Total Bilirubin: 1.28 mg/dL — ABNORMAL HIGH (ref 0.20–1.20)

## 2014-06-23 ENCOUNTER — Other Ambulatory Visit: Payer: Medicare HMO

## 2014-06-30 ENCOUNTER — Ambulatory Visit (HOSPITAL_BASED_OUTPATIENT_CLINIC_OR_DEPARTMENT_OTHER): Payer: Medicare HMO | Admitting: Internal Medicine

## 2014-06-30 ENCOUNTER — Encounter: Payer: Self-pay | Admitting: Internal Medicine

## 2014-06-30 VITALS — BP 120/73 | HR 67 | Temp 97.5°F | Resp 19 | Ht 69.0 in | Wt 286.7 lb

## 2014-06-30 DIAGNOSIS — C3432 Malignant neoplasm of lower lobe, left bronchus or lung: Secondary | ICD-10-CM | POA: Diagnosis not present

## 2014-06-30 DIAGNOSIS — C3431 Malignant neoplasm of lower lobe, right bronchus or lung: Secondary | ICD-10-CM
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
## 2014-07-01 ENCOUNTER — Telehealth: Payer: Self-pay | Admitting: Internal Medicine

## 2014-07-01 NOTE — Telephone Encounter (Signed)
s.w. pt and advised on Sept and OCT appt....pt ok and aware....mailed pt appt sched/avs and letter

## 2014-09-04 ENCOUNTER — Encounter: Payer: Self-pay | Admitting: Gastroenterology

## 2014-09-15 ENCOUNTER — Ambulatory Visit: Payer: Commercial Managed Care - HMO | Admitting: Internal Medicine

## 2014-09-18 ENCOUNTER — Ambulatory Visit: Payer: Medicare HMO | Admitting: Gastroenterology

## 2014-10-21 ENCOUNTER — Encounter: Payer: Self-pay | Admitting: Internal Medicine

## 2014-10-22 ENCOUNTER — Inpatient Hospital Stay (HOSPITAL_COMMUNITY)
Admission: EM | Admit: 2014-10-22 | Discharge: 2014-10-28 | DRG: 287 | Disposition: A | Discharge status: Home / Self Care | Payer: Commercial Managed Care - HMO | Attending: Cardiovascular Disease | Admitting: Cardiovascular Disease

## 2014-10-22 ENCOUNTER — Emergency Department (HOSPITAL_COMMUNITY): Payer: Commercial Managed Care - HMO

## 2014-10-22 ENCOUNTER — Encounter (HOSPITAL_COMMUNITY): Payer: Self-pay

## 2014-10-22 DIAGNOSIS — E871 Hypo-osmolality and hyponatremia: Secondary | ICD-10-CM | POA: Diagnosis present

## 2014-10-22 DIAGNOSIS — R0602 Shortness of breath: Secondary | ICD-10-CM

## 2014-10-22 DIAGNOSIS — Z923 Personal history of irradiation: Secondary | ICD-10-CM

## 2014-10-22 DIAGNOSIS — E875 Hyperkalemia: Secondary | ICD-10-CM | POA: Diagnosis present

## 2014-10-22 DIAGNOSIS — Z85118 Personal history of other malignant neoplasm of bronchus and lung: Secondary | ICD-10-CM

## 2014-10-22 DIAGNOSIS — J9611 Chronic respiratory failure with hypoxia: Secondary | ICD-10-CM | POA: Diagnosis present

## 2014-10-22 DIAGNOSIS — J449 Chronic obstructive pulmonary disease, unspecified: Secondary | ICD-10-CM | POA: Diagnosis present

## 2014-10-22 DIAGNOSIS — N183 Chronic kidney disease, stage 3 (moderate): Secondary | ICD-10-CM | POA: Diagnosis present

## 2014-10-22 DIAGNOSIS — N179 Acute kidney failure, unspecified: Secondary | ICD-10-CM | POA: Diagnosis present

## 2014-10-22 DIAGNOSIS — R059 Cough, unspecified: Secondary | ICD-10-CM

## 2014-10-22 DIAGNOSIS — Z87891 Personal history of nicotine dependence: Secondary | ICD-10-CM

## 2014-10-22 DIAGNOSIS — C3431 Malignant neoplasm of lower lobe, right bronchus or lung: Secondary | ICD-10-CM

## 2014-10-22 DIAGNOSIS — R05 Cough: Secondary | ICD-10-CM

## 2014-10-22 DIAGNOSIS — Z23 Encounter for immunization: Secondary | ICD-10-CM | POA: Diagnosis not present

## 2014-10-22 DIAGNOSIS — I5023 Acute on chronic systolic (congestive) heart failure: Principal | ICD-10-CM | POA: Diagnosis present

## 2014-10-22 DIAGNOSIS — I2510 Atherosclerotic heart disease of native coronary artery without angina pectoris: Secondary | ICD-10-CM | POA: Diagnosis present

## 2014-10-22 DIAGNOSIS — E785 Hyperlipidemia, unspecified: Secondary | ICD-10-CM | POA: Diagnosis present

## 2014-10-22 DIAGNOSIS — Z7982 Long term (current) use of aspirin: Secondary | ICD-10-CM | POA: Diagnosis not present

## 2014-10-22 DIAGNOSIS — Z902 Acquired absence of lung [part of]: Secondary | ICD-10-CM | POA: Diagnosis present

## 2014-10-22 DIAGNOSIS — J441 Chronic obstructive pulmonary disease with (acute) exacerbation: Secondary | ICD-10-CM

## 2014-10-22 DIAGNOSIS — I129 Hypertensive chronic kidney disease with stage 1 through stage 4 chronic kidney disease, or unspecified chronic kidney disease: Secondary | ICD-10-CM | POA: Diagnosis present

## 2014-10-22 DIAGNOSIS — I5021 Acute systolic (congestive) heart failure: Secondary | ICD-10-CM | POA: Diagnosis present

## 2014-10-22 DIAGNOSIS — J9620 Acute and chronic respiratory failure, unspecified whether with hypoxia or hypercapnia: Secondary | ICD-10-CM | POA: Diagnosis present

## 2014-10-22 HISTORY — DX: Dependence on supplemental oxygen: Z99.81

## 2014-10-22 HISTORY — DX: Calculus of kidney: N20.0

## 2014-10-22 LAB — BASIC METABOLIC PANEL
Anion gap: 9 (ref 5–15)
BUN: 49 mg/dL — AB (ref 6–20)
CHLORIDE: 108 mmol/L (ref 101–111)
CO2: 21 mmol/L — ABNORMAL LOW (ref 22–32)
CREATININE: 1.82 mg/dL — AB (ref 0.61–1.24)
Calcium: 9.4 mg/dL (ref 8.9–10.3)
GFR calc Af Amer: 42 mL/min — ABNORMAL LOW (ref >60)
GFR calc non Af Amer: 36 mL/min — ABNORMAL LOW (ref >60)
GLUCOSE: 88 mg/dL (ref 65–99)
Potassium: 4.8 mmol/L (ref 3.5–5.1)
SODIUM: 138 mmol/L (ref 135–145)

## 2014-10-22 LAB — CBC
HEMATOCRIT: 43.4 % (ref 39.0–52.0)
HEMOGLOBIN: 14.1 g/dL (ref 13.0–17.0)
MCH: 32.4 pg (ref 26.0–34.0)
MCHC: 32.5 g/dL (ref 30.0–36.0)
MCV: 99.8 fL (ref 78.0–100.0)
Platelets: 149 10*3/uL — ABNORMAL LOW (ref 150–400)
RBC: 4.35 MIL/uL (ref 4.22–5.81)
RDW: 16.1 % — AB (ref 11.5–15.5)
WBC: 4 10*3/uL (ref 4.0–10.5)

## 2014-10-22 LAB — I-STAT TROPONIN, ED: TROPONIN I, POC: 0 ng/mL (ref 0.00–0.08)

## 2014-10-22 LAB — BRAIN NATRIURETIC PEPTIDE: B Natriuretic Peptide: 451.1 pg/mL — ABNORMAL HIGH (ref 0.0–100.0)

## 2014-10-22 LAB — TROPONIN I
TROPONIN I: 0.04 ng/mL — AB (ref <0.031)
Troponin I: <0.03 ng/mL (ref <0.031)

## 2014-10-22 MED ORDER — HEPARIN SODIUM (PORCINE) 5000 UNIT/ML IJ SOLN
5000.0000 [IU] | Freq: Three times a day (TID) | INTRAMUSCULAR | Status: DC
  Administered 2014-10-22 – 2014-10-28 (×17): 5000 [IU] via SUBCUTANEOUS
  Filled 2014-10-22 (×18): qty 1

## 2014-10-22 MED ORDER — LISINOPRIL 10 MG PO TABS
10.0000 mg | ORAL_TABLET | Freq: Every day | ORAL | Status: DC
  Administered 2014-10-23 – 2014-10-25 (×3): 10 mg via ORAL
  Filled 2014-10-22 (×3): qty 1

## 2014-10-22 MED ORDER — ACETAMINOPHEN 325 MG PO TABS: 650.0000 mg | ORAL_TABLET | ORAL | Status: DC | PRN

## 2014-10-22 MED ORDER — IPRATROPIUM-ALBUTEROL 0.5-2.5 (3) MG/3ML IN SOLN
3.0000 mL | Freq: Once | RESPIRATORY_TRACT | Status: AC
  Administered 2014-10-22: 3 mL via RESPIRATORY_TRACT
  Filled 2014-10-22: qty 3

## 2014-10-22 MED ORDER — ONDANSETRON HCL 4 MG/2ML IJ SOLN: 4.0000 mg | Freq: Four times a day (QID) | INTRAMUSCULAR | Status: DC | PRN

## 2014-10-22 MED ORDER — METOLAZONE 5 MG PO TABS
5.0000 mg | ORAL_TABLET | Freq: Every day | ORAL | Status: DC
  Administered 2014-10-23 – 2014-10-26 (×4): 5 mg via ORAL
  Filled 2014-10-22 (×5): qty 1

## 2014-10-22 MED ORDER — FUROSEMIDE 10 MG/ML IJ SOLN
40.0000 mg | Freq: Two times a day (BID) | INTRAMUSCULAR | Status: DC
  Administered 2014-10-22 – 2014-10-25 (×6): 40 mg via INTRAVENOUS
  Filled 2014-10-22 (×8): qty 4

## 2014-10-22 MED ORDER — DIGOXIN 125 MCG PO TABS
0.1250 mg | ORAL_TABLET | Freq: Every day | ORAL | Status: DC
  Administered 2014-10-23 – 2014-10-24 (×2): 0.125 mg via ORAL
  Filled 2014-10-22 (×2): qty 1

## 2014-10-22 MED ORDER — POTASSIUM CHLORIDE CRYS ER 20 MEQ PO TBCR
20.0000 meq | EXTENDED_RELEASE_TABLET | Freq: Two times a day (BID) | ORAL | Status: DC
  Administered 2014-10-22 – 2014-10-24 (×5): 20 meq via ORAL
  Filled 2014-10-22 (×5): qty 1

## 2014-10-22 MED ORDER — SODIUM CHLORIDE 0.9 % IJ SOLN
3.0000 mL | Freq: Two times a day (BID) | INTRAMUSCULAR | Status: DC
  Administered 2014-10-22 – 2014-10-28 (×10): 3 mL via INTRAVENOUS

## 2014-10-22 MED ORDER — AMLODIPINE BESYLATE 10 MG PO TABS
10.0000 mg | ORAL_TABLET | Freq: Every day | ORAL | Status: DC
  Administered 2014-10-23 – 2014-10-26 (×4): 10 mg via ORAL
  Filled 2014-10-22 (×4): qty 1

## 2014-10-22 MED ORDER — SODIUM CHLORIDE 0.9 % IJ SOLN
3.0000 mL | INTRAMUSCULAR | Status: DC | PRN
Start: 2014-10-22 — End: 2014-10-28

## 2014-10-22 MED ORDER — SODIUM CHLORIDE 0.9 % IV SOLN: 250.0000 mL | INTRAVENOUS | Status: DC | PRN

## 2014-10-22 NOTE — ED Notes (Signed)
Report attempted 

## 2014-10-22 NOTE — ED Notes (Signed)
Pt O2 saturation decreased to 81% upon standing to urinate. O2 increased to 5L, O2 sat 94% at this time.
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
## 2014-10-22 NOTE — ED Notes (Signed)
Pt presents with 3 day h/o shortness of breath.  Pt reports he is normally sob but reports the change was sudden.  Pt denies any chest discomfort, reports productive cough with yellow phlegm.

## 2014-10-23 ENCOUNTER — Encounter (HOSPITAL_COMMUNITY)
Admission: EM | Disposition: A | Discharge status: Home / Self Care | Payer: Self-pay | Source: Home / Self Care | Attending: Cardiovascular Disease

## 2014-10-23 ENCOUNTER — Encounter (HOSPITAL_COMMUNITY): Payer: Self-pay | Admitting: *Deleted

## 2014-10-23 ENCOUNTER — Inpatient Hospital Stay (HOSPITAL_COMMUNITY): Payer: Commercial Managed Care - HMO

## 2014-10-23 HISTORY — PX: CARDIAC CATHETERIZATION: SHX172

## 2014-10-23 LAB — BASIC METABOLIC PANEL
Anion gap: 8 (ref 5–15)
BUN: 50 mg/dL — AB (ref 6–20)
CO2: 23 mmol/L (ref 22–32)
CREATININE: 1.87 mg/dL — AB (ref 0.61–1.24)
Calcium: 9.3 mg/dL (ref 8.9–10.3)
Chloride: 105 mmol/L (ref 101–111)
GFR calc Af Amer: 41 mL/min — ABNORMAL LOW (ref >60)
GFR, EST NON AFRICAN AMERICAN: 35 mL/min — AB (ref >60)
GLUCOSE: 96 mg/dL (ref 65–99)
POTASSIUM: 4.5 mmol/L (ref 3.5–5.1)
SODIUM: 136 mmol/L (ref 135–145)

## 2014-10-23 LAB — POCT I-STAT 3, ART BLOOD GAS (G3+)
ACID-BASE DEFICIT: 2 mmol/L (ref 0.0–2.0)
BICARBONATE: 22.5 meq/L (ref 20.0–24.0)
O2 Saturation: 94 %
TCO2: 24 mmol/L (ref 0–100)
pCO2 arterial: 37.5 mmHg (ref 35.0–45.0)
pH, Arterial: 7.386 (ref 7.350–7.450)
pO2, Arterial: 71 mmHg — ABNORMAL LOW (ref 80.0–100.0)

## 2014-10-23 LAB — POCT I-STAT 3, VENOUS BLOOD GAS (G3P V)
ACID-BASE DEFICIT: 3 mmol/L — AB (ref 0.0–2.0)
Bicarbonate: 22.7 mEq/L (ref 20.0–24.0)
O2 SAT: 66 %
PCO2 VEN: 40.9 mmHg — AB (ref 45.0–50.0)
TCO2: 24 mmol/L (ref 0–100)
pH, Ven: 7.353 — ABNORMAL HIGH (ref 7.250–7.300)
pO2, Ven: 36 mmHg (ref 30.0–45.0)

## 2014-10-23 LAB — PROTIME-INR
INR: 1.21 (ref 0.00–1.49)
PROTHROMBIN TIME: 15.5 s — AB (ref 11.6–15.2)

## 2014-10-23 LAB — TROPONIN I

## 2014-10-23 SURGERY — RIGHT/LEFT HEART CATH AND CORONARY ANGIOGRAPHY
Anesthesia: LOCAL

## 2014-10-23 MED ORDER — SODIUM CHLORIDE 0.9 % IJ SOLN
3.0000 mL | Freq: Two times a day (BID) | INTRAMUSCULAR | Status: DC
  Administered 2014-10-23 – 2014-10-25 (×3): 3 mL via INTRAVENOUS

## 2014-10-23 MED ORDER — SODIUM CHLORIDE 0.9 % IV SOLN: 250.0000 mL | INTRAVENOUS | Status: DC | PRN

## 2014-10-23 MED ORDER — SODIUM CHLORIDE 0.9 % IJ SOLN: 3.0000 mL | INTRAMUSCULAR | Status: DC | PRN

## 2014-10-23 MED ORDER — LORAZEPAM 0.5 MG PO TABS
0.5000 mg | ORAL_TABLET | Freq: Every day | ORAL | Status: DC
  Administered 2014-10-25 – 2014-10-27 (×3): 0.5 mg via ORAL
  Filled 2014-10-23 (×3): qty 1

## 2014-10-23 MED ORDER — IOHEXOL 350 MG/ML SOLN
INTRAVENOUS | Status: DC | PRN
  Administered 2014-10-23: 35 mL via INTRA_ARTERIAL

## 2014-10-23 MED ORDER — LIDOCAINE HCL (PF) 1 % IJ SOLN
INTRAMUSCULAR | Status: AC
  Filled 2014-10-23: qty 30

## 2014-10-23 MED ORDER — LIDOCAINE HCL (PF) 1 % IJ SOLN
INTRAMUSCULAR | Status: DC | PRN
  Administered 2014-10-23: 16:00:00

## 2014-10-23 MED ORDER — HEPARIN (PORCINE) IN NACL 2-0.9 UNIT/ML-% IJ SOLN
INTRAMUSCULAR | Status: AC
  Filled 2014-10-23: qty 500

## 2014-10-23 MED ORDER — SODIUM CHLORIDE 0.9 % IJ SOLN
3.0000 mL | Freq: Two times a day (BID) | INTRAMUSCULAR | Status: DC
  Administered 2014-10-23: 3 mL via INTRAVENOUS

## 2014-10-23 MED ORDER — SODIUM CHLORIDE 0.9 % IV SOLN: INTRAVENOUS | Status: DC

## 2014-10-23 MED ORDER — SODIUM CHLORIDE 0.9 % IJ SOLN
3.0000 mL | Freq: Two times a day (BID) | INTRAMUSCULAR | Status: DC
  Administered 2014-10-25 (×2): 3 mL via INTRAVENOUS

## 2014-10-23 MED ORDER — ASPIRIN 81 MG PO CHEW: 81.0000 mg | CHEWABLE_TABLET | ORAL | Status: DC

## 2014-10-23 MED ORDER — ASPIRIN 81 MG PO CHEW
81.0000 mg | CHEWABLE_TABLET | ORAL | Status: AC
  Administered 2014-10-23: 81 mg via ORAL
  Filled 2014-10-23: qty 1

## 2014-10-23 MED ORDER — INFLUENZA VAC SPLIT QUAD 0.5 ML IM SUSY
0.5000 mL | PREFILLED_SYRINGE | INTRAMUSCULAR | Status: DC
  Filled 2014-10-23: qty 0.5

## 2014-10-23 SURGICAL SUPPLY — 11 items
CATH INFINITI 5FR MULTPACK ANG (CATHETERS) ×2 IMPLANT
CATH SWAN GANZ 7F STRAIGHT (CATHETERS) ×2 IMPLANT
KIT HEART LEFT (KITS) ×2 IMPLANT
KIT HEART RIGHT NAMIC (KITS) ×2 IMPLANT
PACK CARDIAC CATHETERIZATION (CUSTOM PROCEDURE TRAY) ×2 IMPLANT
SHEATH PINNACLE 5F 10CM (SHEATH) ×2 IMPLANT
SHEATH PINNACLE 7F 10CM (SHEATH) ×2 IMPLANT
SYR MEDRAD MARK V 150ML (SYRINGE) ×2 IMPLANT
TRANSDUCER W/STOPCOCK (MISCELLANEOUS) ×4 IMPLANT
WIRE EMERALD 3MM-J .025X260CM (WIRE) ×1 IMPLANT
WIRE EMERALD 3MM-J .035X150CM (WIRE) ×2 IMPLANT

## 2014-10-23 NOTE — Consult Note (Signed)
   [facility] Inpatient Consult   10/23/2014  [name] [facility] October 01, 1945 [id number] Patient evaluated for community based chronic disease management services with [place] [facility] as a benefit of patient's [facility]. Spoke with patient at bedside to explain [place] Management services. Patient endorses that Dr. [name] is his primary care provider  at [facility]. Patient's son, Stasia Cavalier was at bedside and patient endorsed having son available for the [place] Management information. Consent formed signed and [facility] folder with contact information given.    Patient will receive post discharge transition of care call and will be evaluated for monthly home visits for assessments and disease process education.  Left contact information and [facility] literature at bedside. Made Inpatient Case Manager aware that [place] Management following. Of note, [facility] Care Management services does not replace or interfere with any services that are arranged by inpatient case management or social work.  For additional questions or referrals please contact:   Natividad Brood, RN BSN [facility] Liaison  [phone] business mobile phone

## 2014-10-23 NOTE — CV Procedure (Signed)
79fsheath removed from RFA at 1615.  737fheath removed from RFTorontot 1630.  Total manual pressure held for 20 min.  No complications.  Post sheath removal instruction given.  Pt understands.  papable rt DP.  Rt groin level 0.  Tegaderm dressing applied to site.

## 2014-10-23 NOTE — Progress Notes (Signed)
Discussed placing 2nd IV site with patient to prepare for cardiac cath. Patient asked if chest port could be accessed. Called Dr. Doylene Canard who stated it would be okay for patient to get cardiac cath with one IV site.

## 2014-10-23 NOTE — Progress Notes (Signed)
Report received from cardiac cath lab.

## 2014-10-23 NOTE — Care Management Note (Signed)
Case Management Note  Patient Details  Name: [name] MRN: [id number] Date of Birth: [birth year]  Subjective/Objective:   69 y.o. M  who is agreeable to "all the help I can get" . Son at bedside also agreable and Patent attorney.               Action/Plan:[facility] will provide Home  Heart Failure RN. Confirmed with Butch Penny. [facility] Rep).    Expected Discharge Date:  10/27/14               Expected Discharge Plan:  [place]  In-House Referral:     Discharge planning Services  CM Consult  Post Acute Care Choice:    Choice offered to:  Patient, Adult Children  DME Arranged:    DME Agency:  [place] Arranged:  RN (Heart Failure Program) [place] Agency:  [place]  Status of Service:  In process, will continue to follow  Medicare Important Message Given:    Date Medicare IM Given:    Medicare IM give by:    Date Additional Medicare IM Given:    Additional Medicare Important Message give by:     If discussed at [place] of Stay Meetings, dates discussed:    Additional Comments:  Delrae Sawyers, RN 10/23/2014, 12:24 PM

## 2014-10-23 NOTE — Progress Notes (Signed)
Utilization review completed. Bertha Stanfill, RN, BSN. 

## 2014-10-23 NOTE — Interval H&P Note (Signed)
History and Physical Interval Note:  10/23/2014 2:52 PM  [name]  has presented today for surgery, with the diagnosis of heart failure  The various methods of treatment have been discussed with the patient and family. After consideration of risks, benefits and other options for treatment, the patient has consented to  Procedure(s): Right/Left Heart Cath and Coronary Angiography (N/A) as a surgical intervention .  The patient's history has been reviewed, patient examined, no change in status, stable for surgery.  I have reviewed the patient's chart and labs.  Questions were answered to the patient's satisfaction.     KADAKIA,AJAY S

## 2014-10-23 NOTE — Progress Notes (Signed)
  Echocardiogram 2D Echocardiogram has been performed.  Diamond Nickel 10/23/2014, 10:51 AM

## 2014-10-24 LAB — CBC
HCT: 44.7 % (ref 39.0–52.0)
HEMOGLOBIN: 14.6 g/dL (ref 13.0–17.0)
MCH: 32.7 pg (ref 26.0–34.0)
MCHC: 32.7 g/dL (ref 30.0–36.0)
MCV: 100.2 fL — AB (ref 78.0–100.0)
Platelets: 163 10*3/uL (ref 150–400)
RBC: 4.46 MIL/uL (ref 4.22–5.81)
RDW: 16.2 % — ABNORMAL HIGH (ref 11.5–15.5)
WBC: 3.8 10*3/uL — AB (ref 4.0–10.5)

## 2014-10-24 LAB — BASIC METABOLIC PANEL
ANION GAP: 8 (ref 5–15)
BUN: 47 mg/dL — ABNORMAL HIGH (ref 6–20)
CALCIUM: 9.5 mg/dL (ref 8.9–10.3)
CO2: 25 mmol/L (ref 22–32)
Chloride: 103 mmol/L (ref 101–111)
Creatinine, Ser: 1.76 mg/dL — ABNORMAL HIGH (ref 0.61–1.24)
GFR, EST AFRICAN AMERICAN: 44 mL/min — AB (ref >60)
GFR, EST NON AFRICAN AMERICAN: 38 mL/min — AB (ref >60)
GLUCOSE: 87 mg/dL (ref 65–99)
POTASSIUM: 4.7 mmol/L (ref 3.5–5.1)
Sodium: 136 mmol/L (ref 135–145)

## 2014-10-24 MED ORDER — PANTOPRAZOLE SODIUM 40 MG IV SOLR
40.0000 mg | INTRAVENOUS | Status: DC
  Administered 2014-10-24: 40 mg via INTRAVENOUS
  Filled 2014-10-24: qty 40

## 2014-10-24 MED ORDER — OXYCODONE-ACETAMINOPHEN 5-325 MG PO TABS
1.0000 | ORAL_TABLET | Freq: Four times a day (QID) | ORAL | Status: DC | PRN
  Administered 2014-10-24 – 2014-10-27 (×3): 1 via ORAL
  Filled 2014-10-24 (×4): qty 1

## 2014-10-24 NOTE — Progress Notes (Signed)
Ref: [name], MD   Subjective:  Improving little every day.   Objective:  Vital Signs in the last 24 hours: Temp:  [97.6 F (36.4 C)-98.4 F (36.9 C)] 97.9 F (36.6 C) (09/24 0521) Pulse Rate:  [0-73] 62 (09/24 0521) Cardiac Rhythm:  [-] Sinus bradycardia (09/23 2000) Resp:  [0-25] 19 (09/24 0521) BP: (102-127)/(44-121) 102/61 mmHg (09/24 0521) SpO2:  [0 %-100 %] 99 % (09/24 0521) Weight:  [106.505 kg (234 lb 12.8 oz)] 106.505 kg (234 lb 12.8 oz) (09/24 0521)  Physical Exam: BP Readings from Last 1 Encounters:  10/24/14 102/61    Wt Readings from Last 1 Encounters:  10/24/14 106.505 kg (234 lb 12.8 oz)    Weight change: -2.223 kg (-4 lb 14.4 oz)  HEENT: [place]/AT, Eyes-Brown, PERL, EOMI, Conjunctiva-Pink, Sclera-Non-icteric Neck: + JVD, No bruit, Trachea midline. Lungs:  Clearing, Bilateral. Cardiac:  Regular rhythm, normal S1 and S2, no S3.  Abdomen:  Soft, non-tender. Extremities:  1 + edema up to knees present. No cyanosis. No clubbing. CNS: AxOx3, Cranial nerves grossly intact, moves all 4 extremities. Right handed. Skin: Warm and dry.   Intake/Output from previous day: 09/23 0701 - 09/24 0700 In: 60 [P.O.:60] Out: 2350 [Urine:2350]    Lab Results: BMET    Component Value Date/Time   NA 136 10/24/2014 0409   NA 136 10/23/2014 0340   NA 138 10/22/2014 1320   NA 141 06/22/2014 0915   NA 141 03/20/2014 0848   NA 139 12/29/2013 1017   K 4.7 10/24/2014 0409   K 4.5 10/23/2014 0340   K 4.8 10/22/2014 1320   K 3.9 06/22/2014 0915   K 3.8 03/20/2014 0848   K 4.4 12/29/2013 1017   CL 103 10/24/2014 0409   CL 105 10/23/2014 0340   CL 108 10/22/2014 1320   CL 105 07/24/2012 0859   CL 102 05/28/2012 1149   CL 104 05/21/2012 1243   CO2 25 10/24/2014 0409   CO2 23 10/23/2014 0340   CO2 21* 10/22/2014 1320   CO2 23 06/22/2014 0915   CO2 26 03/20/2014 0848   CO2 27 12/29/2013 1017   GLUCOSE 87 10/24/2014 0409   GLUCOSE 96 10/23/2014 0340   GLUCOSE 88  10/22/2014 1320   GLUCOSE 88 06/22/2014 0915   GLUCOSE 105 03/20/2014 0848   GLUCOSE 89 12/29/2013 1017   GLUCOSE 126* 07/24/2012 0859   GLUCOSE 173* 05/28/2012 1149   GLUCOSE 220* 05/21/2012 1243   BUN 47* 10/24/2014 0409   BUN 50* 10/23/2014 0340   BUN 49* 10/22/2014 1320   BUN 37.1* 06/22/2014 0915   BUN 41.9* 03/20/2014 0848   BUN 27.8* 12/29/2013 1017   CREATININE 1.76* 10/24/2014 0409   CREATININE 1.87* 10/23/2014 0340   CREATININE 1.82* 10/22/2014 1320   CREATININE 1.6* 06/22/2014 0915   CREATININE 1.7* 03/20/2014 0848   CREATININE 1.6* 12/29/2013 1017   CALCIUM 9.5 10/24/2014 0409   CALCIUM 9.3 10/23/2014 0340   CALCIUM 9.4 10/22/2014 1320   CALCIUM 8.8 06/22/2014 0915   CALCIUM 9.2 03/20/2014 0848   CALCIUM 9.5 12/29/2013 1017   GFRNONAA 38* 10/24/2014 0409   GFRNONAA 35* 10/23/2014 0340   GFRNONAA 36* 10/22/2014 1320   GFRAA 44* 10/24/2014 0409   GFRAA 41* 10/23/2014 0340   GFRAA 42* 10/22/2014 1320   CBC    Component Value Date/Time   WBC 3.8* 10/24/2014 0409   WBC 3.8* 06/22/2014 0915   RBC 4.46 10/24/2014 0409   RBC 3.86* 06/22/2014 0915  HGB 14.6 10/24/2014 0409   HGB 12.2* 06/22/2014 0915   HCT 44.7 10/24/2014 0409   HCT 38.3* 06/22/2014 0915   PLT 163 10/24/2014 0409   PLT 148 06/22/2014 0915   MCV 100.2* 10/24/2014 0409   MCV 99.2* 06/22/2014 0915   MCH 32.7 10/24/2014 0409   MCH 31.6 06/22/2014 0915   MCHC 32.7 10/24/2014 0409   MCHC 31.9* 06/22/2014 0915   RDW 16.2* 10/24/2014 0409   RDW 16.5* 06/22/2014 0915   LYMPHSABS 0.6* 06/22/2014 0915   LYMPHSABS 0.7 04/27/2014 1828   MONOABS 0.6 06/22/2014 0915   MONOABS 0.5 04/27/2014 1828   EOSABS 0.0 06/22/2014 0915   EOSABS 0.0 04/27/2014 1828   BASOSABS 0.0 06/22/2014 0915   BASOSABS 0.0 04/27/2014 1828   HEPATIC Function Panel  Recent Labs  12/29/13 1017 03/20/14 0848 06/22/14 0915  PROT 7.4 7.6 7.5   HEMOGLOBIN A1C No components found for: HGA1C,  MPG CARDIAC ENZYMES Lab  Results  Component Value Date   CKTOTAL 95 06/28/2010   CKMB 1.4 06/28/2010   TROPONINI <0.03 10/23/2014   TROPONINI <0.03 10/22/2014   TROPONINI 0.04* 10/22/2014   BNP  Recent Labs  12/17/13 0530 12/18/13 0605 12/19/13 0514  PROBNP 2944.0* 2665.0* 3054.0*   TSH  Recent Labs  04/11/14 1830  TSH 2.025   CHOLESTEROL No results for input(s): CHOL in the last 8760 hours.  Scheduled Meds: . amLODipine  10 mg Oral Daily  . digoxin  0.125 mg Oral Daily  . furosemide  40 mg Intravenous Q12H  . heparin  5,000 Units Subcutaneous 3 times per day  . Influenza vac split quadrivalent PF  0.5 mL Intramuscular Tomorrow-1000  . lisinopril  10 mg Oral Daily  . LORazepam  0.5 mg Oral QHS  . metolazone  5 mg Oral Daily  . potassium chloride SA  20 mEq Oral BID  . sodium chloride  3 mL Intravenous Q12H  . sodium chloride  3 mL Intravenous Q12H  . sodium chloride  3 mL Intravenous Q12H   Continuous Infusions:  PRN Meds:.sodium chloride, sodium chloride, sodium chloride, acetaminophen, ondansetron (ZOFRAN) IV, sodium chloride, sodium chloride, sodium chloride  Assessment/Plan: Acute left heart systolic failure Acute Pulmonary edema Hypertension COPD S/P Lung cancer  S/O left lower lung lobectomy  Continue diuresis.     LOS: 2 days    Dixie Dials  MD  10/24/2014, 9:17 AM

## 2014-10-25 LAB — BASIC METABOLIC PANEL
ANION GAP: 8 (ref 5–15)
BUN: 69 mg/dL — ABNORMAL HIGH (ref 6–20)
CALCIUM: 9.1 mg/dL (ref 8.9–10.3)
CO2: 24 mmol/L (ref 22–32)
CREATININE: 3.21 mg/dL — AB (ref 0.61–1.24)
Chloride: 100 mmol/L — ABNORMAL LOW (ref 101–111)
GFR, EST AFRICAN AMERICAN: 21 mL/min — AB (ref >60)
GFR, EST NON AFRICAN AMERICAN: 18 mL/min — AB (ref >60)
Glucose, Bld: 89 mg/dL (ref 65–99)
Potassium: 5.4 mmol/L — ABNORMAL HIGH (ref 3.5–5.1)
SODIUM: 132 mmol/L — AB (ref 135–145)

## 2014-10-25 LAB — DIGOXIN LEVEL: Digoxin Level: 1.1 ng/mL (ref 0.8–2.0)

## 2014-10-25 MED ORDER — PANTOPRAZOLE SODIUM 40 MG PO TBEC
40.0000 mg | DELAYED_RELEASE_TABLET | Freq: Every day | ORAL | Status: DC
  Administered 2014-10-25 – 2014-10-28 (×4): 40 mg via ORAL
  Filled 2014-10-25 (×3): qty 1

## 2014-10-25 MED ORDER — SODIUM CHLORIDE 0.9 % IV BOLUS (SEPSIS)
500.0000 mL | Freq: Once | INTRAVENOUS | Status: AC
  Administered 2014-10-25: 500 mL via INTRAVENOUS

## 2014-10-25 NOTE — Progress Notes (Signed)
Ref: [name], MD   Subjective:  Not peeing today per patient. Afebrile.  Objective:  Vital Signs in the last 24 hours: Temp:  [97.3 F (36.3 C)-97.7 F (36.5 C)] 97.7 F (36.5 C) (09/25 0513) Pulse Rate:  [54-64] 54 (09/25 0513) Cardiac Rhythm:  [-] Sinus bradycardia (09/25 0733) Resp:  [18-20] 18 (09/25 0513) BP: (73-91)/(47-58) 73/47 mmHg (09/25 0513) SpO2:  [93 %-96 %] 96 % (09/25 0513) Weight:  [108.228 kg (238 lb 9.6 oz)] 108.228 kg (238 lb 9.6 oz) (09/25 0513)  Physical Exam: BP Readings from Last 1 Encounters:  10/25/14 73/47    Wt Readings from Last 1 Encounters:  10/25/14 108.228 kg (238 lb 9.6 oz)    Weight change: 1.724 kg (3 lb 12.8 oz)  HEENT: [place]/AT, Eyes-Brown, PERL, EOMI, Conjunctiva-Pink, Sclera-Non-icteric Neck: No JVD, No bruit, Trachea midline. Lungs:  Clearing with basal fine crackles, Bilateral. Cardiac:  Regular rhythm, normal S1 and S2, no S3.  Abdomen:  Soft, non-tender. Extremities:  Trace edema present. No cyanosis. No clubbing. CNS: AxOx3, Cranial nerves grossly intact, moves all 4 extremities. Right handed. Skin: Warm and dry.   Intake/Output from previous day: 09/24 0701 - 09/25 0700 In: 720 [P.O.:720] Out: 550 [Urine:550]    Lab Results: BMET    Component Value Date/Time   NA 132* 10/25/2014 0336   NA 136 10/24/2014 0409   NA 136 10/23/2014 0340   NA 141 06/22/2014 0915   NA 141 03/20/2014 0848   NA 139 12/29/2013 1017   K 5.4* 10/25/2014 0336   K 4.7 10/24/2014 0409   K 4.5 10/23/2014 0340   K 3.9 06/22/2014 0915   K 3.8 03/20/2014 0848   K 4.4 12/29/2013 1017   CL 100* 10/25/2014 0336   CL 103 10/24/2014 0409   CL 105 10/23/2014 0340   CL 105 07/24/2012 0859   CL 102 05/28/2012 1149   CL 104 05/21/2012 1243   CO2 24 10/25/2014 0336   CO2 25 10/24/2014 0409   CO2 23 10/23/2014 0340   CO2 23 06/22/2014 0915   CO2 26 03/20/2014 0848   CO2 27 12/29/2013 1017   GLUCOSE 89 10/25/2014 0336   GLUCOSE 87 10/24/2014  0409   GLUCOSE 96 10/23/2014 0340   GLUCOSE 88 06/22/2014 0915   GLUCOSE 105 03/20/2014 0848   GLUCOSE 89 12/29/2013 1017   GLUCOSE 126* 07/24/2012 0859   GLUCOSE 173* 05/28/2012 1149   GLUCOSE 220* 05/21/2012 1243   BUN 69* 10/25/2014 0336   BUN 47* 10/24/2014 0409   BUN 50* 10/23/2014 0340   BUN 37.1* 06/22/2014 0915   BUN 41.9* 03/20/2014 0848   BUN 27.8* 12/29/2013 1017   CREATININE 3.21* 10/25/2014 0336   CREATININE 1.76* 10/24/2014 0409   CREATININE 1.87* 10/23/2014 0340   CREATININE 1.6* 06/22/2014 0915   CREATININE 1.7* 03/20/2014 0848   CREATININE 1.6* 12/29/2013 1017   CALCIUM 9.1 10/25/2014 0336   CALCIUM 9.5 10/24/2014 0409   CALCIUM 9.3 10/23/2014 0340   CALCIUM 8.8 06/22/2014 0915   CALCIUM 9.2 03/20/2014 0848   CALCIUM 9.5 12/29/2013 1017   GFRNONAA 18* 10/25/2014 0336   GFRNONAA 38* 10/24/2014 0409   GFRNONAA 35* 10/23/2014 0340   GFRAA 21* 10/25/2014 0336   GFRAA 44* 10/24/2014 0409   GFRAA 41* 10/23/2014 0340   CBC    Component Value Date/Time   WBC 3.8* 10/24/2014 0409   WBC 3.8* 06/22/2014 0915   RBC 4.46 10/24/2014 0409   RBC 3.86* 06/22/2014 0915  HGB 14.6 10/24/2014 0409   HGB 12.2* 06/22/2014 0915   HCT 44.7 10/24/2014 0409   HCT 38.3* 06/22/2014 0915   PLT 163 10/24/2014 0409   PLT 148 06/22/2014 0915   MCV 100.2* 10/24/2014 0409   MCV 99.2* 06/22/2014 0915   MCH 32.7 10/24/2014 0409   MCH 31.6 06/22/2014 0915   MCHC 32.7 10/24/2014 0409   MCHC 31.9* 06/22/2014 0915   RDW 16.2* 10/24/2014 0409   RDW 16.5* 06/22/2014 0915   LYMPHSABS 0.6* 06/22/2014 0915   LYMPHSABS 0.7 04/27/2014 1828   MONOABS 0.6 06/22/2014 0915   MONOABS 0.5 04/27/2014 1828   EOSABS 0.0 06/22/2014 0915   EOSABS 0.0 04/27/2014 1828   BASOSABS 0.0 06/22/2014 0915   BASOSABS 0.0 04/27/2014 1828   HEPATIC Function Panel  Recent Labs  12/29/13 1017 03/20/14 0848 06/22/14 0915  PROT 7.4 7.6 7.5   HEMOGLOBIN A1C No components found for: HGA1C,   MPG CARDIAC ENZYMES Lab Results  Component Value Date   CKTOTAL 95 06/28/2010   CKMB 1.4 06/28/2010   TROPONINI <0.03 10/23/2014   TROPONINI <0.03 10/22/2014   TROPONINI 0.04* 10/22/2014   BNP  Recent Labs  12/17/13 0530 12/18/13 0605 12/19/13 0514  PROBNP 2944.0* 2665.0* 3054.0*   TSH  Recent Labs  04/11/14 1830  TSH 2.025   CHOLESTEROL No results for input(s): CHOL in the last 8760 hours.  Scheduled Meds: . amLODipine  10 mg Oral Daily  . heparin  5,000 Units Subcutaneous 3 times per day  . Influenza vac split quadrivalent PF  0.5 mL Intramuscular Tomorrow-1000  . lisinopril  10 mg Oral Daily  . LORazepam  0.5 mg Oral QHS  . metolazone  5 mg Oral Daily  . pantoprazole (PROTONIX) IV  40 mg Intravenous Q24H  . sodium chloride  500 mL Intravenous Once  . sodium chloride  3 mL Intravenous Q12H  . sodium chloride  3 mL Intravenous Q12H  . sodium chloride  3 mL Intravenous Q12H   Continuous Infusions:  PRN Meds:.sodium chloride, sodium chloride, sodium chloride, acetaminophen, ondansetron (ZOFRAN) IV, oxyCODONE-acetaminophen, sodium chloride, sodium chloride, sodium chloride  Assessment/Plan: Acute left heart systolic failure Acute Pulmonary edema Acute on chronic renal failure Hypertension COPD S/P Lung cancer  S/O left lower lung lobectomy  Stop lasix. IV fluid bolus.     LOS: 3 days    Dixie Dials  MD  10/25/2014, 10:53 AM

## 2014-10-26 LAB — BASIC METABOLIC PANEL
Anion gap: 10 (ref 5–15)
BUN: 91 mg/dL — AB (ref 6–20)
CALCIUM: 8.4 mg/dL — AB (ref 8.9–10.3)
CO2: 20 mmol/L — ABNORMAL LOW (ref 22–32)
Chloride: 97 mmol/L — ABNORMAL LOW (ref 101–111)
Creatinine, Ser: 3.54 mg/dL — ABNORMAL HIGH (ref 0.61–1.24)
GFR calc Af Amer: 19 mL/min — ABNORMAL LOW (ref >60)
GFR, EST NON AFRICAN AMERICAN: 16 mL/min — AB (ref >60)
GLUCOSE: 80 mg/dL (ref 65–99)
POTASSIUM: 5.8 mmol/L — AB (ref 3.5–5.1)
Sodium: 127 mmol/L — ABNORMAL LOW (ref 135–145)

## 2014-10-26 LAB — URIC ACID: Uric Acid, Serum: 11 mg/dL — ABNORMAL HIGH (ref 4.4–7.6)

## 2014-10-26 MED ORDER — SODIUM CHLORIDE 0.9 % IV BOLUS (SEPSIS)
1000.0000 mL | Freq: Once | INTRAVENOUS | Status: AC
  Administered 2014-10-26: 1000 mL via INTRAVENOUS

## 2014-10-26 MED ORDER — SODIUM POLYSTYRENE SULFONATE 15 GM/60ML PO SUSP
30.0000 g | Freq: Once | ORAL | Status: AC
  Administered 2014-10-26: 30 g via ORAL
  Filled 2014-10-26: qty 120

## 2014-10-26 NOTE — Progress Notes (Signed)
Ref: [name], MD   Subjective:  Sitting up with low blood pressure. Afebrile. Worsening renal function. Off lisinopril and lasix already.  Objective:  Vital Signs in the last 24 hours: Temp:  [97.7 F (36.5 C)-98.4 F (36.9 C)] 98.3 F (36.8 C) (09/26 1246) Pulse Rate:  [53-59] 59 (09/26 1907) Cardiac Rhythm:  [-] Sinus bradycardia;Bundle branch block (09/26 0707) Resp:  [18-20] 18 (09/26 1246) BP: (80-90)/(43-50) 86/48 mmHg (09/26 1907) SpO2:  [95 %-100 %] 97 % (09/26 1246) Weight:  [109.907 kg (242 lb 4.8 oz)] 109.907 kg (242 lb 4.8 oz) (09/26 0550)  Physical Exam: BP Readings from Last 1 Encounters:  10/26/14 86/48    Wt Readings from Last 1 Encounters:  10/26/14 109.907 kg (242 lb 4.8 oz)    Weight change: 1.678 kg (3 lb 11.2 oz)  HEENT: [place]/AT, Eyes-Brown, PERL, EOMI, Conjunctiva-Pink, Sclera-Non-icteric Neck: No JVD, No bruit, Trachea midline. Lungs:  Clear, Bilateral. Cardiac:  Regular rhythm, normal S1 and S2, no S3.  Abdomen:  Soft, non-tender. Extremities:  No edema present. No cyanosis. No clubbing. CNS: AxOx3, Cranial nerves grossly intact, moves all 4 extremities. Right handed. Skin: Warm and dry.   Intake/Output from previous day: 09/25 0701 - 09/26 0700 In: 980 [P.O.:480; IV Piggyback:500] Out: 725 [Urine:725]    Lab Results: BMET    Component Value Date/Time   NA 127* 10/26/2014 1652   NA 132* 10/25/2014 0336   NA 136 10/24/2014 0409   NA 141 06/22/2014 0915   NA 141 03/20/2014 0848   NA 139 12/29/2013 1017   K 5.8* 10/26/2014 1652   K 5.4* 10/25/2014 0336   K 4.7 10/24/2014 0409   K 3.9 06/22/2014 0915   K 3.8 03/20/2014 0848   K 4.4 12/29/2013 1017   CL 97* 10/26/2014 1652   CL 100* 10/25/2014 0336   CL 103 10/24/2014 0409   CL 105 07/24/2012 0859   CL 102 05/28/2012 1149   CL 104 05/21/2012 1243   CO2 20* 10/26/2014 1652   CO2 24 10/25/2014 0336   CO2 25 10/24/2014 0409   CO2 23 06/22/2014 0915   CO2 26 03/20/2014 0848    CO2 27 12/29/2013 1017   GLUCOSE 80 10/26/2014 1652   GLUCOSE 89 10/25/2014 0336   GLUCOSE 87 10/24/2014 0409   GLUCOSE 88 06/22/2014 0915   GLUCOSE 105 03/20/2014 0848   GLUCOSE 89 12/29/2013 1017   GLUCOSE 126* 07/24/2012 0859   GLUCOSE 173* 05/28/2012 1149   GLUCOSE 220* 05/21/2012 1243   BUN 91* 10/26/2014 1652   BUN 69* 10/25/2014 0336   BUN 47* 10/24/2014 0409   BUN 37.1* 06/22/2014 0915   BUN 41.9* 03/20/2014 0848   BUN 27.8* 12/29/2013 1017   CREATININE 3.54* 10/26/2014 1652   CREATININE 3.21* 10/25/2014 0336   CREATININE 1.76* 10/24/2014 0409   CREATININE 1.6* 06/22/2014 0915   CREATININE 1.7* 03/20/2014 0848   CREATININE 1.6* 12/29/2013 1017   CALCIUM 8.4* 10/26/2014 1652   CALCIUM 9.1 10/25/2014 0336   CALCIUM 9.5 10/24/2014 0409   CALCIUM 8.8 06/22/2014 0915   CALCIUM 9.2 03/20/2014 0848   CALCIUM 9.5 12/29/2013 1017   GFRNONAA 16* 10/26/2014 1652   GFRNONAA 18* 10/25/2014 0336   GFRNONAA 38* 10/24/2014 0409   GFRAA 19* 10/26/2014 1652   GFRAA 21* 10/25/2014 0336   GFRAA 44* 10/24/2014 0409   CBC    Component Value Date/Time   WBC 3.8* 10/24/2014 0409   WBC 3.8* 06/22/2014 0915   RBC 4.46  10/24/2014 0409   RBC 3.86* 06/22/2014 0915   HGB 14.6 10/24/2014 0409   HGB 12.2* 06/22/2014 0915   HCT 44.7 10/24/2014 0409   HCT 38.3* 06/22/2014 0915   PLT 163 10/24/2014 0409   PLT 148 06/22/2014 0915   MCV 100.2* 10/24/2014 0409   MCV 99.2* 06/22/2014 0915   MCH 32.7 10/24/2014 0409   MCH 31.6 06/22/2014 0915   MCHC 32.7 10/24/2014 0409   MCHC 31.9* 06/22/2014 0915   RDW 16.2* 10/24/2014 0409   RDW 16.5* 06/22/2014 0915   LYMPHSABS 0.6* 06/22/2014 0915   LYMPHSABS 0.7 04/27/2014 1828   MONOABS 0.6 06/22/2014 0915   MONOABS 0.5 04/27/2014 1828   EOSABS 0.0 06/22/2014 0915   EOSABS 0.0 04/27/2014 1828   BASOSABS 0.0 06/22/2014 0915   BASOSABS 0.0 04/27/2014 1828   HEPATIC Function Panel  Recent Labs  12/29/13 1017 03/20/14 0848 06/22/14 0915   PROT 7.4 7.6 7.5   HEMOGLOBIN A1C No components found for: HGA1C,  MPG CARDIAC ENZYMES Lab Results  Component Value Date   CKTOTAL 95 06/28/2010   CKMB 1.4 06/28/2010   TROPONINI <0.03 10/23/2014   TROPONINI <0.03 10/22/2014   TROPONINI 0.04* 10/22/2014   BNP  Recent Labs  12/17/13 0530 12/18/13 0605 12/19/13 0514  PROBNP 2944.0* 2665.0* 3054.0*   TSH  Recent Labs  04/11/14 1830  TSH 2.025   CHOLESTEROL No results for input(s): CHOL in the last 8760 hours.  Scheduled Meds: . heparin  5,000 Units Subcutaneous 3 times per day  . Influenza vac split quadrivalent PF  0.5 mL Intramuscular Tomorrow-1000  . LORazepam  0.5 mg Oral QHS  . pantoprazole  40 mg Oral Q1200  . sodium chloride  1,000 mL Intravenous Once  . sodium chloride  3 mL Intravenous Q12H  . sodium polystyrene  30 g Oral Once   Continuous Infusions:  PRN Meds:.sodium chloride, acetaminophen, ondansetron (ZOFRAN) IV, oxyCODONE-acetaminophen, sodium chloride  Assessment/Plan: Acute left heart systolic failure Acute Pulmonary edema Acute on chronic renal failure Hypertension COPD S/P Lung cancer  S/O left lower lung lobectomy Hyperkalemia due to renal dysfunction Hyponatremia due to metolazone use   Stop amlodipine and Metolazone. Fluid bolus as tolerated. Renal consult for acute on chronic kidney failure.   LOS: 4 days    Dixie Dials  MD  10/26/2014, 7:25 PM

## 2014-10-26 NOTE — Patient Outreach (Signed)
[facility]) Care Management  10/26/2014  Hatem Cull Dukeman Mar 08, 1945 [id number]   Referral from Natividad Brood, RN to assign [employer], assigned Erenest Rasher, RN.  Thanks, Ronnell Freshwater. [place], [place] Assistant Phone: [phone] Fax: [phone]

## 2014-10-26 NOTE — Care Management Important Message (Signed)
Important Message  Patient Details  Name: [name] MRN: [id number] Date of Birth: 1945/06/07   Medicare Important Message Given:  Yes-second notification given    Delorse Lek 10/26/2014, 3:06 PM

## 2014-10-27 ENCOUNTER — Encounter (HOSPITAL_COMMUNITY): Payer: Self-pay | Admitting: Cardiovascular Disease

## 2014-10-27 LAB — RENAL FUNCTION PANEL
Albumin: 3.5 g/dL (ref 3.5–5.0)
Anion gap: 10 (ref 5–15)
BUN: 85 mg/dL — ABNORMAL HIGH (ref 6–20)
CALCIUM: 8.4 mg/dL — AB (ref 8.9–10.3)
CO2: 22 mmol/L (ref 22–32)
CREATININE: 2.84 mg/dL — AB (ref 0.61–1.24)
Chloride: 100 mmol/L — ABNORMAL LOW (ref 101–111)
GFR calc non Af Amer: 21 mL/min — ABNORMAL LOW (ref >60)
GFR, EST AFRICAN AMERICAN: 25 mL/min — AB (ref >60)
GLUCOSE: 98 mg/dL (ref 65–99)
Phosphorus: 4.5 mg/dL (ref 2.5–4.6)
Potassium: 4.3 mmol/L (ref 3.5–5.1)
SODIUM: 132 mmol/L — AB (ref 135–145)

## 2014-10-27 MED ORDER — SODIUM CHLORIDE 0.9 % IV BOLUS (SEPSIS)
1000.0000 mL | Freq: Once | INTRAVENOUS | Status: AC
  Administered 2014-10-27: 1000 mL via INTRAVENOUS

## 2014-10-27 MED ORDER — ALLOPURINOL 100 MG PO TABS
100.0000 mg | ORAL_TABLET | Freq: Every day | ORAL | Status: DC
  Administered 2014-10-27 – 2014-10-28 (×2): 100 mg via ORAL
  Filled 2014-10-27 (×2): qty 1

## 2014-10-27 NOTE — Progress Notes (Signed)
Ref: [name], MD   Subjective:  Feling little better. Peeing little more per patient. Elevated uric acid. Improving hyponatremia and creatinine.  Objective:  Vital Signs in the last 24 hours: Temp:  [97.7 F (36.5 C)-98.3 F (36.8 C)] 97.7 F (36.5 C) (09/27 0512) Pulse Rate:  [53-60] 60 (09/27 0512) Cardiac Rhythm:  [-] Sinus bradycardia;Bundle branch block;Heart block (09/27 0715) Resp:  [18-20] 20 (09/27 0512) BP: (80-91)/(43-54) 91/54 mmHg (09/27 0512) SpO2:  [95 %-98 %] 95 % (09/27 0512) Weight:  [111.494 kg (245 lb 12.8 oz)] 111.494 kg (245 lb 12.8 oz) (09/27 0512)  Physical Exam: BP Readings from Last 1 Encounters:  10/27/14 91/54    Wt Readings from Last 1 Encounters:  10/27/14 111.494 kg (245 lb 12.8 oz)    Weight change: 1.588 kg (3 lb 8 oz)  HEENT: [place]/AT, Eyes-Brown, PERL, EOMI, Conjunctiva-Pink, Sclera-Non-icteric Neck: No JVD, No bruit, Trachea midline. Lungs:  Basal crackles, Bilateral. Cardiac:  Regular rhythm, normal S1 and S2, no S3.  Abdomen:  Soft, non-tender. Extremities:  Trace edema present. No cyanosis. No clubbing. CNS: AxOx3, Cranial nerves grossly intact, moves all 4 extremities. Right handed. Skin: Warm and dry.   Intake/Output from previous day: 09/26 0701 - 09/27 0700 In: 820 [P.O.:820] Out: 1250 [Urine:1250]    Lab Results: BMET    Component Value Date/Time   NA 132* 10/27/2014 0358   NA 127* 10/26/2014 1652   NA 132* 10/25/2014 0336   NA 141 06/22/2014 0915   NA 141 03/20/2014 0848   NA 139 12/29/2013 1017   K 4.3 10/27/2014 0358   K 5.8* 10/26/2014 1652   K 5.4* 10/25/2014 0336   K 3.9 06/22/2014 0915   K 3.8 03/20/2014 0848   K 4.4 12/29/2013 1017   CL 100* 10/27/2014 0358   CL 97* 10/26/2014 1652   CL 100* 10/25/2014 0336   CL 105 07/24/2012 0859   CL 102 05/28/2012 1149   CL 104 05/21/2012 1243   CO2 22 10/27/2014 0358   CO2 20* 10/26/2014 1652   CO2 24 10/25/2014 0336   CO2 23 06/22/2014 0915   CO2 26  03/20/2014 0848   CO2 27 12/29/2013 1017   GLUCOSE 98 10/27/2014 0358   GLUCOSE 80 10/26/2014 1652   GLUCOSE 89 10/25/2014 0336   GLUCOSE 88 06/22/2014 0915   GLUCOSE 105 03/20/2014 0848   GLUCOSE 89 12/29/2013 1017   GLUCOSE 126* 07/24/2012 0859   GLUCOSE 173* 05/28/2012 1149   GLUCOSE 220* 05/21/2012 1243   BUN 85* 10/27/2014 0358   BUN 91* 10/26/2014 1652   BUN 69* 10/25/2014 0336   BUN 37.1* 06/22/2014 0915   BUN 41.9* 03/20/2014 0848   BUN 27.8* 12/29/2013 1017   CREATININE 2.84* 10/27/2014 0358   CREATININE 3.54* 10/26/2014 1652   CREATININE 3.21* 10/25/2014 0336   CREATININE 1.6* 06/22/2014 0915   CREATININE 1.7* 03/20/2014 0848   CREATININE 1.6* 12/29/2013 1017   CALCIUM 8.4* 10/27/2014 0358   CALCIUM 8.4* 10/26/2014 1652   CALCIUM 9.1 10/25/2014 0336   CALCIUM 8.8 06/22/2014 0915   CALCIUM 9.2 03/20/2014 0848   CALCIUM 9.5 12/29/2013 1017   GFRNONAA 21* 10/27/2014 0358   GFRNONAA 16* 10/26/2014 1652   GFRNONAA 18* 10/25/2014 0336   GFRAA 25* 10/27/2014 0358   GFRAA 19* 10/26/2014 1652   GFRAA 21* 10/25/2014 0336   CBC    Component Value Date/Time   WBC 3.8* 10/24/2014 0409   WBC 3.8* 06/22/2014 0915   RBC 4.46  10/24/2014 0409   RBC 3.86* 06/22/2014 0915   HGB 14.6 10/24/2014 0409   HGB 12.2* 06/22/2014 0915   HCT 44.7 10/24/2014 0409   HCT 38.3* 06/22/2014 0915   PLT 163 10/24/2014 0409   PLT 148 06/22/2014 0915   MCV 100.2* 10/24/2014 0409   MCV 99.2* 06/22/2014 0915   MCH 32.7 10/24/2014 0409   MCH 31.6 06/22/2014 0915   MCHC 32.7 10/24/2014 0409   MCHC 31.9* 06/22/2014 0915   RDW 16.2* 10/24/2014 0409   RDW 16.5* 06/22/2014 0915   LYMPHSABS 0.6* 06/22/2014 0915   LYMPHSABS 0.7 04/27/2014 1828   MONOABS 0.6 06/22/2014 0915   MONOABS 0.5 04/27/2014 1828   EOSABS 0.0 06/22/2014 0915   EOSABS 0.0 04/27/2014 1828   BASOSABS 0.0 06/22/2014 0915   BASOSABS 0.0 04/27/2014 1828   HEPATIC Function Panel  Recent Labs  12/29/13 1017 03/20/14 0848  06/22/14 0915  PROT 7.4 7.6 7.5   HEMOGLOBIN A1C No components found for: HGA1C,  MPG CARDIAC ENZYMES Lab Results  Component Value Date   CKTOTAL 95 06/28/2010   CKMB 1.4 06/28/2010   TROPONINI <0.03 10/23/2014   TROPONINI <0.03 10/22/2014   TROPONINI 0.04* 10/22/2014   BNP  Recent Labs  12/17/13 0530 12/18/13 0605 12/19/13 0514  PROBNP 2944.0* 2665.0* 3054.0*   TSH  Recent Labs  04/11/14 1830  TSH 2.025   CHOLESTEROL No results for input(s): CHOL in the last 8760 hours.  Scheduled Meds: . allopurinol  100 mg Oral Daily  . heparin  5,000 Units Subcutaneous 3 times per day  . Influenza vac split quadrivalent PF  0.5 mL Intramuscular Tomorrow-1000  . LORazepam  0.5 mg Oral QHS  . pantoprazole  40 mg Oral Q1200  . sodium chloride  3 mL Intravenous Q12H   Continuous Infusions:  PRN Meds:.sodium chloride, acetaminophen, ondansetron (ZOFRAN) IV, oxyCODONE-acetaminophen, sodium chloride  Assessment/Plan: Acute left heart systolic failure Acute Pulmonary edema Acute on chronic respiratory failure Acute on chronic renal failure-improving Hypertension COPD S/P Lung cancer  S/P left lower lung lobectomy Hyperkalemia-resolved Hyponatremia-improving  Continue medical treatment.   LOS: 5 days    Dixie Dials  MD  10/27/2014, 9:48 AM

## 2014-10-27 NOTE — Clinical Documentation Improvement (Signed)
Cardiology  Can the diagnosis of CKD be further specified?   CKD Stage I - GFR greater than or equal to 90  CKD Stage II - GFR 60-89  CKD Stage III - GFR 30-59  CKD Stage IV - GFR 15-29  CKD Stage V - GFR < 15  ESRD (End Stage Renal Disease)  Other condition  Unable to clinically determine   Supporting Information:   9/25 progress note: Assessment/Plan: Acute on Chronic renal failure  9/26 progress note: Assessment/Plan: Acute on Chronic renal failure.Fluid bolus as tolerated. Renal consult for acute on chronic kidney failure.   Strict I&O Daily weights  Component     Latest Ref Rng 10/22/2014 10/23/2014 10/24/2014 10/25/2014             BUN     6 - 20 mg/dL 49 (H) 50 (H) 47 (H) 69 (H)  Creatinine     0.61 - 1.24 mg/dL 1.82 (H) 1.87 (H) 1.76 (H) 3.21 (H)                          EGFR (African American)     >60 mL/min 42 (L) 41 (L) 44 (L) 21 (L)   Component     Latest Ref Rng 10/26/2014 10/27/2014           BUN     6 - 20 mg/dL 91 (H) 85 (H)  Creatinine     0.61 - 1.24 mg/dL 3.54 (H) 2.84 (H)                  EGFR (African American)     >60 mL/min 19 (L) 25 (L)    Please exercise your independent, professional judgment when responding. A specific answer is not anticipated or expected.   Thank You, [place] [phone]

## 2014-10-27 NOTE — Clinical Documentation Improvement (Signed)
Cardiology  Please clarify respiratory status    Document Acuity - Acute, Chronic, Acute on Chronic respiratory failure  Other  Clinically Undetermined  Document any a Associated diagnoses/conditions.   Supporting Information:  Home oxygen 2L/m increase to 3L/m due to shortness of breath Admitted with CHF Room air O2 sat on admission: 83% O2 sats 80-100% on 2-3 L/m via [place]  H&P: PMHx of lung cancer s/p left lower lobectomy, COPD and CHF presenting with worsening SOB x 2-3 days. + cough with yellow sputum  Oxygen therapy via [place]; Keep sats greater than 92% Continuous pulse Ox   Component     Latest Ref Rng 10/23/2014          3:56 PM  pH, Arterial     7.350 - 7.450 7.386  pCO2 arterial     35.0 - 45.0 mmHg 37.5  pO2, Arterial     80.0 - 100.0 mmHg 71.0 (L)  Bicarbonate     20.0 - 24.0 mEq/L 22.5  TCO2     0 - 100 mmol/L 24  Acid-base deficit     0.0 - 2.0 mmol/L 2.0  O2 Saturation      94.0  Patient temperature      HIDE  Sample type      ARTERIAL    9/22 CXR: IMPRESSION: 1. Bilateral interstitial opacity may reflect interstitial pulmonary edema or atypical pneumonia. Moderate enlargement of the cardiopericardial silhouette. 2. Severe emphysema. 3. 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 Prior CT has revealed some honeycombing in the left lung base as well.   Please exercise your independent, professional judgment when responding. A specific answer is not anticipated or expected.   Thank You,  [place] [phone]

## 2014-10-27 NOTE — Progress Notes (Signed)
PT Cancellation Note  Patient Details Name: [name] MRN: [id number] DOB: 03-Mar-1945   Cancelled Treatment:    Reason Eval/Treat Not Completed: Patient declined, no reason specified (pt up in chair stating too SoB to move, no apparent dyspnea or distress and sats 93%. Pt educated for purpose but continued to decline)   Melford Aase 10/27/2014, 1:41 PM Elwyn Reach, [place]
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
## 2014-10-28 LAB — RENAL FUNCTION PANEL
Albumin: 3.3 g/dL — ABNORMAL LOW (ref 3.5–5.0)
Anion gap: 8 (ref 5–15)
BUN: 73 mg/dL — AB (ref 6–20)
CHLORIDE: 102 mmol/L (ref 101–111)
CO2: 23 mmol/L (ref 22–32)
Calcium: 8.7 mg/dL — ABNORMAL LOW (ref 8.9–10.3)
Creatinine, Ser: 2.05 mg/dL — ABNORMAL HIGH (ref 0.61–1.24)
GFR, EST AFRICAN AMERICAN: 36 mL/min — AB (ref >60)
GFR, EST NON AFRICAN AMERICAN: 31 mL/min — AB (ref >60)
Glucose, Bld: 74 mg/dL (ref 65–99)
POTASSIUM: 4.3 mmol/L (ref 3.5–5.1)
Phosphorus: 3.7 mg/dL (ref 2.5–4.6)
Sodium: 133 mmol/L — ABNORMAL LOW (ref 135–145)

## 2014-10-28 MED ORDER — TORSEMIDE 20 MG PO TABS
20.0000 mg | ORAL_TABLET | Freq: Two times a day (BID) | ORAL | Status: DC
  Administered 2014-10-28: 20 mg via ORAL
  Filled 2014-10-28: qty 1

## 2014-10-28 MED ORDER — ALLOPURINOL 100 MG PO TABS: 100.0000 mg | ORAL_TABLET | Freq: Every day | ORAL | Status: DC

## 2014-10-28 NOTE — Progress Notes (Signed)
Pt got discharged discharge instructions provided and patient showed understanding to it, following up with PCP in 1 week, left the hospital in wheelchair with all of his belongings.

## 2014-10-28 NOTE — Evaluation (Signed)
Occupational Therapy Evaluation Patient Details Name: [name] MRN: [id number] DOB: 1945/05/06 Today's Date: 10/28/2014    History of Present Illness 69 year old male with PMHx of lung cancer s/p left lower lobectomy, COPD and CHF presenting with worsening SOB x 2-3 days. + cough with yellow sputum.   Clinical Impression   Pt admitted with above and seems close to functional baseline.  Pt able to complete functional transfers and simulated dressing tasks at Modified Independent/independent level.  Educated on energy conservation strategies for self-care and homemaking tasks, provided handouts.  Pt will not require skilled OT at this time.     Follow Up Recommendations  No OT follow up    Equipment Recommendations  None recommended by OT    Recommendations for Other Services       Precautions / Restrictions Precautions Precautions: None      Mobility Bed Mobility               General bed mobility comments: up in chair (sleeps in chair at home)  Transfers Overall transfer level: Modified independent                    Balance Overall balance assessment: Independent                                          ADL Overall ADL's : Independent                                       General ADL Comments: Pt demonstrated ability to don/doff socks and complete standard height toilet transfers without assist     Vision Vision Assessment?: No apparent visual deficits          Pertinent Vitals/Pain Pain Assessment: No/denies pain     Hand Dominance Left   Extremity/Trunk Assessment Upper Extremity Assessment Upper Extremity Assessment: Overall WFL for tasks assessed   Lower Extremity Assessment Lower Extremity Assessment: Overall WFL for tasks assessed       Communication Communication Communication: No difficulties   Cognition Arousal/Alertness: Awake/alert Behavior During Therapy: Flat affect Overall  Cognitive Status: Within Functional Limits for tasks assessed                                Home Living Family/patient expects to be discharged to:: Private residence Living Arrangements: Alone   Type of Home: House Home Access: Stairs to enter [employer] of Steps: 1 from [place]: One level     Bathroom Shower/Tub: Walk-in shower;Door Shower/tub characteristics: Charity fundraiser: Standard     Home Equipment: Other (comment) (oxygen)          Prior Functioning/Environment Level of Independence: Independent        Comments: drives, gets groceries, cooks    OT Diagnosis: Generalized weakness   OT Problem List: Decreased strength;Decreased activity tolerance;Cardiopulmonary status limiting activity      OT Goals(Current goals can be found in the care plan section) Acute Rehab OT Goals OT Goal Formulation: All assessment and education complete, DC therapy  OT Frequency:      End of Session    Activity Tolerance: Patient tolerated treatment well;No increased pain Patient left: in chair;with call bell/phone within  reach   Time: 1349-1359 OT Time Calculation (min): 10 min Charges:  OT General Charges $OT Visit: 1 Procedure OT Evaluation $Initial OT Evaluation Tier I: 1 Procedure G-CodesSimonne Come, [id number] 10/28/2014, 2:05 PM
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
## 2014-10-28 NOTE — Evaluation (Signed)
Physical Therapy Evaluation Patient Details Name: [name] MRN: [id number] DOB: 1945/04/13 Today's Date: 10/28/2014   History of Present Illness  69 year old male with PMHx of lung cancer s/p left lower lobectomy, COPD and CHF presenting with worsening SOB x 2-3 days. + cough with yellow sputum.  Clinical Impression  Patient seems close to functional baseline without need for skilled PT at this time.  Feel nursing staff can assist to walk in hallway at least 2 times daily with portable O2 for maintaining strength and mobility during acute hospitalization.     Follow Up Recommendations No PT follow up    Equipment Recommendations  None recommended by PT    Recommendations for Other Services       Precautions / Restrictions Precautions Precautions: None      Mobility  Bed Mobility               General bed mobility comments: up in chair (sleeps in chair at home)  Transfers Overall transfer level: Modified independent                  Ambulation/Gait Ambulation/Gait assistance: Independent Ambulation Distance (Feet): 120 Feet Assistive device: None Gait Pattern/deviations: Step-through pattern;Wide base of support;WFL(Within Functional Limits)        Stairs            Wheelchair Mobility    Modified Rankin (Stroke Patients Only)       Balance Overall balance assessment: Independent                                           Pertinent Vitals/Pain Pain Assessment: No/denies pain    Home Living Family/patient expects to be discharged to:: Private residence Living Arrangements: Alone   Type of Home: House Home Access: Stairs to enter   Technical brewer of Steps: 1 from [place]: One level Home Equipment: Other (comment) (oxygen)      Prior Function Level of Independence: Independent         Comments: drives, gets groceries, cooks     Journalist, newspaper        Extremity/Trunk Assessment    Upper Extremity Assessment: Overall WFL for tasks assessed           Lower Extremity Assessment: Overall WFL for tasks assessed         Communication   Communication: No difficulties  Cognition Arousal/Alertness: Awake/alert Behavior During Therapy: Flat affect Overall Cognitive Status: Within Functional Limits for tasks assessed                      General Comments General comments (skin integrity, edema, etc.): edema in LE's.      Exercises        Assessment/Plan    PT Assessment Patent does not need any further PT services  PT Diagnosis Difficulty walking   PT Problem List    PT Treatment Interventions     PT Goals (Current goals can be found in the Care Plan section) Acute Rehab PT Goals PT Goal Formulation: All assessment and education complete, DC therapy    Frequency     Barriers to discharge        Co-evaluation               End of Session   Activity Tolerance: Patient tolerated treatment well Patient left: in  chair;with call bell/phone within reach           Time: 1035-1100 PT Time Calculation (min) (ACUTE ONLY): 25 min   Charges:   PT Evaluation $Initial PT Evaluation Tier I: 1 Procedure PT Treatments $Gait Training: 8-22 mins   PT G Codes:        WYNN,CYNDI 11/07/2014, 11:34 AM  Magda Kiel, [place] 2014-11-07

## 2014-10-28 NOTE — Progress Notes (Signed)
   [facility] Progress Note    Assessment/ Plan:   1. AKI on CKD stage 3: Baseline creatinine 1.6-1.7. Creatinine on admission 1.82 increased to 3.54 on 10/26/2014. Down to 2.05 today. 2.2L of urine output. Likely secondary to acute on chronic systolic CHF. Lasix and lisinopril held. Edema improved. Improving renal function. No emergent HD needs. Will restart his home torsemide '20mg'$  BID. 2. Acute on chronic systolic CHF: BNP 222.9 on admission (Previous BNP on 3/31 681.5). Diuresed per cardiology. Net negative 3.5L since admission. 3. Hyponatremia: Improving. Na 133 today from 132. May consider fluid restriction if hyponatremia worsens.  Subjective:   Doing well this morning. He reports his edema and breathing are stable - improved from admission.   Objective:   BP 89/46 mmHg  Pulse 61  Temp(Src) 97.7 F (36.5 C) (Oral)  Resp 18  Ht '5\' 9"'$  (1.753 m)  Wt 246 lb 8 oz (111.812 kg)  BMI 36.39 kg/m2  SpO2 100%  Intake/Output Summary (Last 24 hours) at 10/28/14 1121 Last data filed at 10/28/14 7989  Gross per 24 hour  Intake 1703.33 ml  Output   2175 ml  Net -471.67 ml   Weight change: 11.2 oz (0.318 kg)  Physical Exam: Gen: NAD, sitting in recliner CVS: RRR Resp: Clear bilaterally Abd: Soft, nontender Ext: 1+ bilateral LE edema  Imaging: No results found.  Labs: BMET  Recent Labs Lab 10/22/14 1320 10/23/14 0340 10/24/14 0409 10/25/14 0336 10/26/14 1652 10/27/14 0358 10/28/14 0440  NA 138 136 136 132* 127* 132* 133*  K 4.8 4.5 4.7 5.4* 5.8* 4.3 4.3  CL 108 105 103 100* 97* 100* 102  CO2 21* '23 25 24 '$ 20* 22 23  GLUCOSE 88 96 87 89 80 98 74  BUN 49* 50* 47* 69* 91* 85* 73*  CREATININE 1.82* 1.87* 1.76* 3.21* 3.54* 2.84* 2.05*  CALCIUM 9.4 9.3 9.5 9.1 8.4* 8.4* 8.7*  PHOS  --   --   --   --   --  4.5 3.7   CBC  Recent Labs Lab 10/22/14 1320 10/24/14 0409  WBC 4.0 3.8*  HGB 14.1 14.6  HCT 43.4 44.7  MCV 99.8 100.2*  PLT 149* 163     Medications:    . allopurinol  100 mg Oral Daily  . heparin  5,000 Units Subcutaneous 3 times per day  . Influenza vac split quadrivalent PF  0.5 mL Intramuscular Tomorrow-1000  . LORazepam  0.5 mg Oral QHS  . pantoprazole  40 mg Oral Q1200  . sodium chloride  3 mL Intravenous Q12H  . torsemide  20 mg Oral BID     Jacques Earthly, MD  Internal Medicine PGY-2  10/28/2014, 11:21 AM

## 2014-10-28 NOTE — Discharge Summary (Signed)
  Discharge summary dictated on 10/28/2014, dictation number is [id number]

## 2014-10-28 NOTE — Plan of Care (Signed)
Problem: Phase I Progression Outcomes Goal: EF % per last Echo/documented,Core Reminder form on chart Outcome: Completed/Met Date Met:  10/28/14 Echo performed on 10/23/2014 EF result - 55-60%

## 2014-10-28 NOTE — Discharge Instructions (Signed)
Acute Kidney Injury Acute kidney injury is a disease in which there is sudden (acute) damage to the kidneys. The kidneys are 2 organs that lie on either side of the spine between the middle of the back and the front of the abdomen. The kidneys:  Remove wastes and extra water from the blood.   Produce important hormones. These help keep bones strong, regulate blood pressure, and help create red blood cells.   Balance the fluids and chemicals in the blood and tissues. A small amount of kidney damage may not cause problems, but a large amount of damage may make it difficult or impossible for the kidneys to work the way they should. Acute kidney injury may develop into long-lasting (chronic) kidney disease. It may also develop into a life-threatening disease called end-stage kidney disease. Acute kidney injury can get worse very quickly, so it should be treated right away. Early treatment may prevent other kidney diseases from developing.  CAUSES   A problem with blood flow to the kidneys. This may be caused by:   Blood loss.   Heart disease.   Severe burns.   Liver disease.  Direct damage to the kidneys. This may be caused by:  Some medicines.   A kidney infection.   Poisoning or consuming toxic substances.   A surgical wound.   A blow to the kidney area.   A problem with urine flow. This may be caused by:   Cancer.   Kidney stones.   An enlarged prostate. SYMPTOMS   Swelling (edema) of the legs, ankles, or feet.   Tiredness (lethargy).   Nausea or vomiting.   Confusion.   Problems with urination, such as:   Painful or burning feeling during urination.   Decreased urine production.   Frequent accidents in children who are potty trained.   Bloody urine.   Muscle twitches and cramps.   Shortness of breath.   Seizures.   Chest pain or pressure. Sometimes, no symptoms are present. DIAGNOSIS Acute kidney injury may be detected  and diagnosed by tests, including blood, urine, imaging, or kidney biopsy tests.  TREATMENT Treatment of acute kidney injury varies depending on the cause and severity of the kidney damage. In mild cases, no treatment may be needed. The kidneys may heal on their own. If acute kidney injury is more severe, your caregiver will treat the cause of the kidney damage, help the kidneys heal, and prevent complications from occurring. Severe cases may require a procedure to remove toxic wastes from the body (dialysis) or surgery to repair kidney damage. Surgery may involve:   Repair of a torn kidney.   Removal of an obstruction. Most of the time, you will need to stay overnight at the hospital.  HOME CARE INSTRUCTIONS:  Follow your prescribed diet.  Only take over-the-counter or prescription medicines as directed by your caregiver.  Do not take any new medicines (prescription, over-the-counter, or nutritional supplements) unless approved by your caregiver. Many medicines can worsen your kidney damage or need to have the dose adjusted.   Keep all follow-up appointments as directed by your caregiver.  Observe your condition to make sure you are healing as expected. SEEK IMMEDIATE MEDICAL CARE IF:  You are feeling ill or have severe pain in the back or side.   Your symptoms return or you have new symptoms.  You have any symptoms of end-stage kidney disease. These include:   Persistent itchiness.   Loss of appetite.   Headaches.   Abnormally dark  or light skin.  Numbness in the hands or feet.   Easy bruising.   Frequent hiccups.   Menstruation stops.   You have a fever.  You have increased urine production.  You have pain or bleeding when urinating. MAKE SURE YOU:   Understand these instructions.  Will watch your condition.  Will get help right away if you are not doing well or get worse Document Released: 08/01/2010 Document Revised: 05/13/2012 Document  Reviewed: 09/15/2011 [facility] Patient Information 2015 [place], [place]. This information is not intended to replace advice given to you by your health care provider. Make sure you discuss any questions you have with your health care provider. Heart Failure Heart failure is a condition in which the heart has trouble pumping blood. This means your heart does not pump blood efficiently for your body to work well. In some cases of heart failure, fluid may back up into your lungs or you may have swelling (edema) in your lower legs. Heart failure is usually a long-term (chronic) condition. It is important for you to take good care of yourself and follow your health care provider's treatment plan. CAUSES  Some health conditions can cause heart failure. Those health conditions include:  High blood pressure (hypertension). Hypertension causes the heart muscle to work harder than normal. When pressure in the blood vessels is high, the heart needs to pump (contract) with more force in order to circulate blood throughout the body. High blood pressure eventually causes the heart to become stiff and weak.  Coronary artery disease (CAD). CAD is the buildup of cholesterol and fat (plaque) in the arteries of the heart. The blockage in the arteries deprives the heart muscle of oxygen and blood. This can cause chest pain and may lead to a heart attack. High blood pressure can also contribute to CAD.  Heart attack (myocardial infarction). A heart attack occurs when one or more arteries in the heart become blocked. The loss of oxygen damages the muscle tissue of the heart. When this happens, part of the heart muscle dies. The injured tissue does not contract as well and weakens the heart's ability to pump blood.  Abnormal heart valves. When the heart valves do not open and close properly, it can cause heart failure. This makes the heart muscle pump harder to keep the blood flowing.  Heart muscle disease (cardiomyopathy or  myocarditis). Heart muscle disease is damage to the heart muscle from a variety of causes. These can include drug or alcohol abuse, infections, or unknown reasons. These can increase the risk of heart failure.  Lung disease. Lung disease makes the heart work harder because the lungs do not work properly. This can cause a strain on the heart, leading it to fail.  Diabetes. Diabetes increases the risk of heart failure. High blood sugar contributes to high fat (lipid) levels in the blood. Diabetes can also cause slow damage to tiny blood vessels that carry important nutrients to the heart muscle. When the heart does not get enough oxygen and food, it can cause the heart to become weak and stiff. This leads to a heart that does not contract efficiently.  Other conditions can contribute to heart failure. These include abnormal heart rhythms, thyroid problems, and low blood counts (anemia). Certain unhealthy behaviors can increase the risk of heart failure, including:  Being overweight.  Smoking or chewing tobacco.  Eating foods high in fat and cholesterol.  Abusing illicit drugs or alcohol.  Lacking physical activity. SYMPTOMS  Heart failure symptoms  may vary and can be hard to detect. Symptoms may include:  Shortness of breath with activity, such as climbing stairs.  Persistent cough.  Swelling of the feet, ankles, legs, or abdomen.  Unexplained weight gain.  Difficulty breathing when lying flat (orthopnea).  Waking from sleep because of the need to sit up and get more air.  Rapid heartbeat.  Fatigue and loss of energy.  Feeling light-headed, dizzy, or close to fainting.  Loss of appetite.  Nausea.  Increased urination during the night (nocturia). DIAGNOSIS  A diagnosis of heart failure is based on your history, symptoms, physical examination, and diagnostic tests. Diagnostic tests for heart failure may include:  Echocardiography.  Electrocardiography.  Chest  X-ray.  Blood tests.  Exercise stress test.  Cardiac angiography.  Radionuclide scans. TREATMENT  Treatment is aimed at managing the symptoms of heart failure. Medicines, behavioral changes, or surgical intervention may be necessary to treat heart failure.  Medicines to help treat heart failure may include:  Angiotensin-converting enzyme (ACE) inhibitors. This type of medicine blocks the effects of a blood protein called angiotensin-converting enzyme. ACE inhibitors relax (dilate) the blood vessels and help lower blood pressure.  Angiotensin receptor blockers (ARBs). This type of medicine blocks the actions of a blood protein called angiotensin. Angiotensin receptor blockers dilate the blood vessels and help lower blood pressure.  Water pills (diuretics). Diuretics cause the kidneys to remove salt and water from the blood. The extra fluid is removed through urination. This loss of extra fluid lowers the volume of blood the heart pumps.  Beta blockers. These prevent the heart from beating too fast and improve heart muscle strength.  Digitalis. This increases the force of the heartbeat.  Healthy behavior changes include:  Obtaining and maintaining a healthy weight.  Stopping smoking or chewing tobacco.  Eating heart-healthy foods.  Limiting or avoiding alcohol.  Stopping illicit drug use.  Physical activity as directed by your health care provider.  Surgical treatment for heart failure may include:  A procedure to open blocked arteries, repair damaged heart valves, or remove damaged heart muscle tissue.  A pacemaker to improve heart muscle function and control certain abnormal heart rhythms.  An internal cardioverter defibrillator to treat certain serious abnormal heart rhythms.  A left ventricular assist device (LVAD) to assist the pumping ability of the heart. HOME CARE INSTRUCTIONS   Take medicines only as directed by your health care provider. Medicines are  important in reducing the workload of your heart, slowing the progression of heart failure, and improving your symptoms.  Do not stop taking your medicine unless directed by your health care provider.  Do not skip any dose of medicine.  Refill your prescriptions before you run out of medicine. Your medicines are needed every day.  Engage in moderate physical activity if directed by your health care provider. Moderate physical activity can benefit some people. The elderly and people with severe heart failure should consult with a health care provider for physical activity recommendations.  Eat heart-healthy foods. Food choices should be free of trans fat and low in saturated fat, cholesterol, and salt (sodium). Healthy choices include fresh or frozen fruits and vegetables, fish, lean meats, legumes, fat-free or low-fat dairy products, and whole grain or high fiber foods. Talk to a dietitian to learn more about heart-healthy foods.  Limit sodium if directed by your health care provider. Sodium restriction may reduce symptoms of heart failure in some people. Talk to a dietitian to learn more about heart-healthy seasonings.  Use healthy cooking methods. Healthy cooking methods include roasting, grilling, broiling, baking, poaching, steaming, or stir-frying. Talk to a dietitian to learn more about healthy cooking methods.  Limit fluids if directed by your health care provider. Fluid restriction may reduce symptoms of heart failure in some people.  Weigh yourself every day. Daily weights are important in the early recognition of excess fluid. You should weigh yourself every morning after you urinate and before you eat breakfast. Wear the same amount of clothing each time you weigh yourself. Record your daily weight. Provide your health care provider with your weight record.  Monitor and record your blood pressure if directed by your health care provider.  Check your pulse if directed by your health  care provider.  Lose weight if directed by your health care provider. Weight loss may reduce symptoms of heart failure in some people.  Stop smoking or chewing tobacco. Nicotine makes your heart work harder by causing your blood vessels to constrict. Do not use nicotine gum or patches before talking to your health care provider.  Keep all follow-up visits as directed by your health care provider. This is important.  Limit alcohol intake to no more than 1 drink per day for nonpregnant women and 2 drinks per day for men. One drink equals 12 ounces of beer, 5 ounces of wine, or 1 ounces of hard liquor. Drinking more than that is harmful to your heart. Tell your health care provider if you drink alcohol several times a week. Talk with your health care provider about whether alcohol is safe for you. If your heart has already been damaged by alcohol or you have severe heart failure, drinking alcohol should be stopped completely.  Stop illicit drug use.  Stay up-to-date with immunizations. It is especially important to prevent respiratory infections through current pneumococcal and influenza immunizations.  Manage other health conditions such as hypertension, diabetes, thyroid disease, or abnormal heart rhythms as directed by your health care provider.  Learn to manage stress.  Plan rest periods when fatigued.  Learn strategies to manage high temperatures. If the weather is extremely hot:  Avoid vigorous physical activity.  Use air conditioning or fans or seek a cooler location.  Avoid caffeine and alcohol.  Wear loose-fitting, lightweight, and light-colored clothing.  Learn strategies to manage cold temperatures. If the weather is extremely cold:  Avoid vigorous physical activity.  Layer clothes.  Wear mittens or gloves, a hat, and a scarf when going outside.  Avoid alcohol.  Obtain ongoing education and support as needed.  Participate in or seek rehabilitation as needed to  maintain or improve independence and quality of life. SEEK MEDICAL CARE IF:   Your weight increases by 03 lb/1.4 kg in 1 day or 05 lb/2.3 kg in a week.  You have increasing shortness of breath that is unusual for you.  You are unable to participate in your usual physical activities.  You tire easily.  You cough more than normal, especially with physical activity.  You have any or more swelling in areas such as your hands, feet, ankles, or abdomen.  You are unable to sleep because it is hard to breathe.  You feel like your heart is beating fast (palpitations).  You become dizzy or light-headed upon standing up. SEEK IMMEDIATE MEDICAL CARE IF:   You have difficulty breathing.  There is a change in mental status such as decreased alertness or difficulty with concentration.  You have a pain or discomfort in your chest.  You  have an episode of fainting (syncope). MAKE SURE YOU:   Understand these instructions.  Will watch your condition.  Will get help right away if you are not doing well or get worse. Document Released: 01/16/2005 Document Revised: 06/02/2013 Document Reviewed: 02/16/2012 [facility] Patient Information 2015 [place], [place]. This information is not intended to replace advice given to you by your health care provider. Make sure you discuss any questions you have with your health care provider.

## 2014-10-29 ENCOUNTER — Other Ambulatory Visit (HOSPITAL_BASED_OUTPATIENT_CLINIC_OR_DEPARTMENT_OTHER): Payer: Commercial Managed Care - HMO

## 2014-10-29 ENCOUNTER — Ambulatory Visit (HOSPITAL_COMMUNITY)
Admission: RE | Admit: 2014-10-29 | Discharge: 2014-10-29 | Disposition: A | Discharge status: Home / Self Care | Payer: Commercial Managed Care - HMO | Source: Ambulatory Visit | Attending: Internal Medicine | Admitting: Internal Medicine

## 2014-10-29 ENCOUNTER — Other Ambulatory Visit: Payer: Medicare HMO

## 2014-10-29 DIAGNOSIS — Z902 Acquired absence of lung [part of]: Secondary | ICD-10-CM | POA: Diagnosis not present

## 2014-10-29 DIAGNOSIS — R918 Other nonspecific abnormal finding of lung field: Secondary | ICD-10-CM | POA: Diagnosis not present

## 2014-10-29 DIAGNOSIS — C3431 Malignant neoplasm of lower lobe, right bronchus or lung: Secondary | ICD-10-CM | POA: Insufficient documentation

## 2014-10-29 DIAGNOSIS — J432 Centrilobular emphysema: Secondary | ICD-10-CM | POA: Insufficient documentation

## 2014-10-29 LAB — COMPREHENSIVE METABOLIC PANEL (CC13)
ALT: 13 U/L (ref 0–55)
AST: 28 U/L (ref 5–34)
Albumin: 3.8 g/dL (ref 3.5–5.0)
Alkaline Phosphatase: 70 U/L (ref 40–150)
Anion Gap: 8 mEq/L (ref 3–11)
BUN: 66 mg/dL — AB (ref 7.0–26.0)
CALCIUM: 9.3 mg/dL (ref 8.4–10.4)
CHLORIDE: 105 meq/L (ref 98–109)
CO2: 24 meq/L (ref 22–29)
CREATININE: 2.1 mg/dL — AB (ref 0.7–1.3)
EGFR: 37 mL/min/{1.73_m2} — ABNORMAL LOW (ref >90)
GLUCOSE: 89 mg/dL (ref 70–140)
Potassium: 4.6 mEq/L (ref 3.5–5.1)
SODIUM: 137 meq/L (ref 136–145)
Total Bilirubin: 0.86 mg/dL (ref 0.20–1.20)
Total Protein: 7.9 g/dL (ref 6.4–8.3)

## 2014-10-29 LAB — CBC WITH DIFFERENTIAL/PLATELET
BASO%: 0.5 % (ref 0.0–2.0)
BASOS ABS: 0 10*3/uL (ref 0.0–0.1)
EOS ABS: 0 10*3/uL (ref 0.0–0.5)
EOS%: 1 % (ref 0.0–7.0)
HCT: 38.5 % (ref 38.4–49.9)
HGB: 12.8 g/dL — ABNORMAL LOW (ref 13.0–17.1)
LYMPH%: 17.1 % (ref 14.0–49.0)
MCH: 32.7 pg (ref 27.2–33.4)
MCHC: 33.3 g/dL (ref 32.0–36.0)
MCV: 98.2 fL — AB (ref 79.3–98.0)
MONO#: 0.6 10*3/uL (ref 0.1–0.9)
MONO%: 16.9 % — AB (ref 0.0–14.0)
NEUT#: 2.3 10*3/uL (ref 1.5–6.5)
NEUT%: 64.5 % (ref 39.0–75.0)
PLATELETS: 179 10*3/uL (ref 140–400)
RBC: 3.92 10*6/uL — AB (ref 4.20–5.82)
RDW: 16.2 % — ABNORMAL HIGH (ref 11.0–14.6)
WBC: 3.6 10*3/uL — AB (ref 4.0–10.3)
lymph#: 0.6 10*3/uL — ABNORMAL LOW (ref 0.9–3.3)
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
## 2014-10-30 ENCOUNTER — Other Ambulatory Visit: Payer: Self-pay

## 2014-11-05 ENCOUNTER — Encounter: Payer: Self-pay | Admitting: Internal Medicine

## 2014-11-05 ENCOUNTER — Telehealth: Payer: Self-pay | Admitting: Internal Medicine

## 2014-11-05 ENCOUNTER — Ambulatory Visit (HOSPITAL_BASED_OUTPATIENT_CLINIC_OR_DEPARTMENT_OTHER): Payer: Commercial Managed Care - HMO | Admitting: Internal Medicine

## 2014-11-05 VITALS — BP 95/90 | HR 104 | Temp 97.5°F | Resp 25 | Ht 69.0 in | Wt 248.2 lb

## 2014-11-05 DIAGNOSIS — C3431 Malignant neoplasm of lower lobe, right bronchus or lung: Secondary | ICD-10-CM

## 2014-11-05 DIAGNOSIS — C3432 Malignant neoplasm of lower lobe, left bronchus or lung: Secondary | ICD-10-CM | POA: Diagnosis not present

## 2014-11-05 DIAGNOSIS — Z66 Do not resuscitate: Secondary | ICD-10-CM | POA: Insufficient documentation
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
## 2014-11-05 NOTE — Telephone Encounter (Signed)
Gave patient avs report and appointments for October. [facility] will call re pet - patient aware.

## 2014-11-06 ENCOUNTER — Other Ambulatory Visit: Payer: Self-pay

## 2014-11-06 NOTE — Patient Outreach (Signed)
This RNCM was successful in making telephone contact with patient. Patient identified himself using HIPPA identifiers. Patient states he has not made a post acute care admission appointment with his primary care physician. This RNCM made call to [facility] to schedule appointment.  Confidential message left on the voice mail of DJ with the request to schedule post acute care appointment.

## 2014-11-12 ENCOUNTER — Other Ambulatory Visit: Payer: Self-pay

## 2014-11-12 NOTE — Patient Outreach (Signed)
This RNCM was successful in making contact with patient on [phone]. Patient identified himself by responding to [place] identifiers of date of birth and address.  patient stated he could [place] talk [place].  Patient requested a call back next week.  Plan: Telephone on October 20

## 2014-11-18 ENCOUNTER — Ambulatory Visit (HOSPITAL_COMMUNITY)
Admission: RE | Admit: 2014-11-18 | Discharge: 2014-11-18 | Disposition: A | Discharge status: Home / Self Care | Payer: Commercial Managed Care - HMO | Source: Ambulatory Visit | Attending: Internal Medicine | Admitting: Internal Medicine

## 2014-11-18 DIAGNOSIS — M899 Disorder of bone, unspecified: Secondary | ICD-10-CM | POA: Insufficient documentation

## 2014-11-18 DIAGNOSIS — I700 Atherosclerosis of aorta: Secondary | ICD-10-CM | POA: Insufficient documentation

## 2014-11-18 DIAGNOSIS — N289 Disorder of kidney and ureter, unspecified: Secondary | ICD-10-CM | POA: Insufficient documentation

## 2014-11-18 DIAGNOSIS — C3431 Malignant neoplasm of lower lobe, right bronchus or lung: Secondary | ICD-10-CM | POA: Diagnosis present

## 2014-11-18 DIAGNOSIS — Z66 Do not resuscitate: Secondary | ICD-10-CM | POA: Diagnosis not present

## 2014-11-18 DIAGNOSIS — K429 Umbilical hernia without obstruction or gangrene: Secondary | ICD-10-CM | POA: Diagnosis not present

## 2014-11-18 LAB — GLUCOSE, CAPILLARY: Glucose-Capillary: 80 mg/dL (ref 65–99)

## 2014-11-18 MED ORDER — FLUDEOXYGLUCOSE F - 18 (FDG) INJECTION
11.8000 | Freq: Once | INTRAVENOUS | Status: DC | PRN
  Administered 2014-11-18: 11.8 via INTRAVENOUS
  Filled 2014-11-18: qty 11.8

## 2014-11-19 ENCOUNTER — Telehealth: Payer: Self-pay | Admitting: *Deleted

## 2014-11-19 ENCOUNTER — Encounter: Payer: Self-pay | Admitting: Internal Medicine

## 2014-11-19 ENCOUNTER — Telehealth: Payer: Self-pay | Admitting: Internal Medicine

## 2014-11-19 ENCOUNTER — Ambulatory Visit (HOSPITAL_BASED_OUTPATIENT_CLINIC_OR_DEPARTMENT_OTHER): Payer: Commercial Managed Care - HMO | Admitting: Internal Medicine

## 2014-11-19 ENCOUNTER — Other Ambulatory Visit: Payer: Self-pay

## 2014-11-19 VITALS — BP 109/51 | HR 76 | Temp 97.7°F | Resp 18 | Ht 69.0 in | Wt 252.9 lb

## 2014-11-19 DIAGNOSIS — C3432 Malignant neoplasm of lower lobe, left bronchus or lung: Secondary | ICD-10-CM

## 2014-11-19 DIAGNOSIS — J441 Chronic obstructive pulmonary disease with (acute) exacerbation: Secondary | ICD-10-CM

## 2014-11-19 DIAGNOSIS — C3411 Malignant neoplasm of upper lobe, right bronchus or lung: Secondary | ICD-10-CM

## 2014-11-19 NOTE — Telephone Encounter (Signed)
per pof to sch pt appt-gave pt copy of avs-sent MW email to sch trmt-pt aware of trmt appt after MD appt

## 2014-11-19 NOTE — Patient Outreach (Signed)
Unsuccessful attempt made to contact patient via telephone for community care coordination assessment at # [phone].  Will make another attempt to contact patient again on Monday, October 24

## 2014-11-19 NOTE — Telephone Encounter (Signed)
Per staff message and POF I have scheduled appts. Advised scheduler of appts. JMW  
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
## 2014-11-23 ENCOUNTER — Other Ambulatory Visit: Payer: Self-pay

## 2014-11-23 NOTE — Patient Outreach (Signed)
This RNCM was unsuccessful in making contact with patient via telephone at [phone]. HIPPA compliant message left on patient's voice mail with contact information for return call.  Will make another attempt to contact patient on Friday, October 28. Also, unsuccessful attempt made to contact patient's contact Stasia Cavalier at [phone].

## 2014-11-26 ENCOUNTER — Telehealth: Payer: Self-pay | Admitting: *Deleted

## 2014-11-26 ENCOUNTER — Ambulatory Visit (HOSPITAL_BASED_OUTPATIENT_CLINIC_OR_DEPARTMENT_OTHER): Payer: Commercial Managed Care - HMO

## 2014-11-26 ENCOUNTER — Ambulatory Visit (HOSPITAL_BASED_OUTPATIENT_CLINIC_OR_DEPARTMENT_OTHER): Payer: Commercial Managed Care - HMO | Admitting: Physician Assistant

## 2014-11-26 ENCOUNTER — Other Ambulatory Visit (HOSPITAL_BASED_OUTPATIENT_CLINIC_OR_DEPARTMENT_OTHER): Payer: Commercial Managed Care - HMO

## 2014-11-26 ENCOUNTER — Encounter: Payer: Self-pay | Admitting: Physician Assistant

## 2014-11-26 VITALS — BP 102/51 | HR 64 | Temp 97.5°F | Resp 18 | Ht 69.0 in | Wt 258.1 lb

## 2014-11-26 DIAGNOSIS — I10 Essential (primary) hypertension: Secondary | ICD-10-CM

## 2014-11-26 DIAGNOSIS — C3432 Malignant neoplasm of lower lobe, left bronchus or lung: Secondary | ICD-10-CM

## 2014-11-26 DIAGNOSIS — Z5112 Encounter for antineoplastic immunotherapy: Secondary | ICD-10-CM

## 2014-11-26 DIAGNOSIS — C3411 Malignant neoplasm of upper lobe, right bronchus or lung: Secondary | ICD-10-CM

## 2014-11-26 DIAGNOSIS — E785 Hyperlipidemia, unspecified: Secondary | ICD-10-CM

## 2014-11-26 DIAGNOSIS — C3400 Malignant neoplasm of unspecified main bronchus: Secondary | ICD-10-CM

## 2014-11-26 DIAGNOSIS — J441 Chronic obstructive pulmonary disease with (acute) exacerbation: Secondary | ICD-10-CM

## 2014-11-26 LAB — COMPREHENSIVE METABOLIC PANEL (CC13)
ALBUMIN: 3.6 g/dL (ref 3.5–5.0)
ALK PHOS: 76 U/L (ref 40–150)
ALT: 10 U/L (ref 0–55)
ANION GAP: 7 meq/L (ref 3–11)
AST: 18 U/L (ref 5–34)
BILIRUBIN TOTAL: 1.03 mg/dL (ref 0.20–1.20)
BUN: 46.3 mg/dL — AB (ref 7.0–26.0)
CO2: 21 meq/L — AB (ref 22–29)
CREATININE: 2 mg/dL — AB (ref 0.7–1.3)
Calcium: 9.2 mg/dL (ref 8.4–10.4)
Chloride: 109 mEq/L (ref 98–109)
EGFR: 37 mL/min/{1.73_m2} — ABNORMAL LOW (ref >90)
Glucose: 89 mg/dl (ref 70–140)
Potassium: 4.8 mEq/L (ref 3.5–5.1)
Sodium: 138 mEq/L (ref 136–145)
TOTAL PROTEIN: 7.4 g/dL (ref 6.4–8.3)

## 2014-11-26 LAB — CBC WITH DIFFERENTIAL/PLATELET
BASO%: 0.5 % (ref 0.0–2.0)
BASOS ABS: 0 10*3/uL (ref 0.0–0.1)
EOS%: 0.5 % (ref 0.0–7.0)
Eosinophils Absolute: 0 10*3/uL (ref 0.0–0.5)
HCT: 36.9 % — ABNORMAL LOW (ref 38.4–49.9)
HGB: 11.8 g/dL — ABNORMAL LOW (ref 13.0–17.1)
LYMPH%: 11.3 % — ABNORMAL LOW (ref 14.0–49.0)
MCH: 31.6 pg (ref 27.2–33.4)
MCHC: 31.9 g/dL — AB (ref 32.0–36.0)
MCV: 99 fL — ABNORMAL HIGH (ref 79.3–98.0)
MONO#: 0.5 10*3/uL (ref 0.1–0.9)
MONO%: 11.7 % (ref 0.0–14.0)
NEUT#: 3.2 10*3/uL (ref 1.5–6.5)
NEUT%: 76 % — AB (ref 39.0–75.0)
Platelets: 214 10*3/uL (ref 140–400)
RBC: 3.73 10*6/uL — ABNORMAL LOW (ref 4.20–5.82)
RDW: 17.1 % — ABNORMAL HIGH (ref 11.0–14.6)
WBC: 4.2 10*3/uL (ref 4.0–10.3)
lymph#: 0.5 10*3/uL — ABNORMAL LOW (ref 0.9–3.3)

## 2014-11-26 LAB — TSH CHCC: TSH: 2.839 m(IU)/L (ref 0.320–4.118)

## 2014-11-26 MED ORDER — SODIUM CHLORIDE 0.9 % IV SOLN
Freq: Once | INTRAVENOUS | Status: AC
  Administered 2014-11-26: 12:00:00 via INTRAVENOUS

## 2014-11-26 MED ORDER — HEPARIN SOD (PORK) LOCK FLUSH 100 UNIT/ML IV SOLN
500.0000 [IU] | Freq: Once | INTRAVENOUS | Status: AC | PRN
  Administered 2014-11-26: 500 [IU]
  Filled 2014-11-26: qty 5

## 2014-11-26 MED ORDER — SODIUM CHLORIDE 0.9 % IV SOLN
240.0000 mg | Freq: Once | INTRAVENOUS | Status: AC
  Administered 2014-11-26: 240 mg via INTRAVENOUS
  Filled 2014-11-26: qty 4

## 2014-11-26 MED ORDER — SODIUM CHLORIDE 0.9 % IJ SOLN
10.0000 mL | INTRAMUSCULAR | Status: DC | PRN
  Administered 2014-11-26: 10 mL
  Filled 2014-11-26: qty 10
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
## 2014-11-26 NOTE — Telephone Encounter (Signed)
Per staff message and POF I have scheduled appts. Advised scheduler of appts. JMW  

## 2014-11-26 NOTE — Progress Notes (Signed)
Creatinine 2.0 okay to treat per Alford Highland. NP.

## 2014-11-26 NOTE — Patient Instructions (Signed)
[facility] Discharge Instructions for Patients Receiving Chemotherapy  Today you received the following chemotherapy agents: Nivolumab   To help prevent nausea and vomiting after your treatment, we encourage you to take your nausea medication as directed.    If you develop nausea and vomiting that is not controlled by your nausea medication, call the clinic.   BELOW ARE SYMPTOMS THAT SHOULD BE REPORTED IMMEDIATELY:  *FEVER GREATER THAN 100.5 F  *CHILLS WITH OR WITHOUT FEVER  NAUSEA AND VOMITING THAT IS NOT CONTROLLED WITH YOUR NAUSEA MEDICATION  *UNUSUAL SHORTNESS OF BREATH  *UNUSUAL BRUISING OR BLEEDING  TENDERNESS IN MOUTH AND THROAT WITH OR WITHOUT PRESENCE OF ULCERS  *URINARY PROBLEMS  *BOWEL PROBLEMS  UNUSUAL RASH Items with * indicate a potential emergency and should be followed up as soon as possible.  Feel free to call the clinic you have any questions or concerns. The clinic phone number is [phone].  Please show the [facility] at check-in to the Emergency Department and triage nurse.  Nivolumab injection What is this medicine? NIVOLUMAB (nye VOL ue mab) is a monoclonal antibody. It is used to treat melanoma, lung cancer, kidney cancer, and Hodgkin lymphoma. This medicine may be used for other purposes; ask your health care provider or pharmacist if you have questions. What should I tell my health care provider before I take this medicine? They need to know if you have any of these conditions: -diabetes -immune system problems -kidney disease -liver disease -lung disease -organ transplant -stomach or intestine problems -thyroid disease -an unusual or allergic reaction to nivolumab, other medicines, foods, dyes, or preservatives -pregnant or trying to get pregnant -breast-feeding How should I use this medicine? This medicine is for infusion into a vein. It is given by a health care professional in a hospital or clinic setting. A  special MedGuide will be given to you before each treatment. Be sure to read this information carefully each time. Talk to your pediatrician regarding the use of this medicine in children. Special care may be needed. Overdosage: If you think you have taken too much of this medicine contact a poison control center or emergency room at once. NOTE: This medicine is only for you. Do not share this medicine with others. What if I miss a dose? It is important not to miss your dose. Call your doctor or health care professional if you are unable to keep an appointment. What may interact with this medicine? Interactions have not been studied. Give your health care provider a list of all the medicines, herbs, non-prescription drugs, or dietary supplements you use. Also tell them if you smoke, drink alcohol, or use illegal drugs. Some items may interact with your medicine. This list may not describe all possible interactions. Give your health care provider a list of all the medicines, herbs, non-prescription drugs, or dietary supplements you use. Also tell them if you smoke, drink alcohol, or use illegal drugs. Some items may interact with your medicine. What should I watch for while using this medicine? This drug may make you feel generally unwell. Continue your course of treatment even though you feel ill unless your doctor tells you to stop. You may need blood work done while you are taking this medicine. Do not become pregnant while taking this medicine or for 5 months after stopping it. Women should inform their doctor if they wish to become pregnant or think they might be pregnant. There is a potential for serious side effects to  an unborn child. Talk to your health care professional or pharmacist for more information. Do not breast-feed an infant while taking this medicine. What side effects may I notice from receiving this medicine? Side effects that you should report to your doctor or health care  professional as soon as possible: -allergic reactions like skin rash, itching or hives, swelling of the face, lips, or tongue -black, tarry stools -blood in the urine -bloody or watery diarrhea -changes in vision -change in sex drive -changes in emotions or moods -chest pain -confusion -cough -decreased appetite -diarrhea -facial flushing -feeling faint or lightheaded -fever, chills -hair loss -hallucination, loss of contact with reality -headache -irritable -joint pain -loss of memory -muscle pain -muscle weakness -seizures -shortness of breath -signs and symptoms of high blood sugar such as dizziness; dry mouth; dry skin; fruity breath; nausea; stomach pain; increased hunger or thirst; increased urination -signs and symptoms of kidney injury like trouble passing urine or change in the amount of urine -signs and symptoms of liver injury like dark yellow or brown urine; general ill feeling or flu-like symptoms; light-colored stools; loss of appetite; nausea; right upper belly pain; unusually weak or tired; yellowing of the eyes or skin -stiff neck -swelling of the ankles, feet, hands -weight gain Side effects that usually do not require medical attention (report to your doctor or health care professional if they continue or are bothersome): -bone pain -constipation -tiredness -vomiting This list may not describe all possible side effects. Call your doctor for medical advice about side effects. You may report side effects to FDA at 1-800-FDA-1088. Where should I keep my medicine? This drug is given in a hospital or clinic and will not be stored at home. NOTE: This sheet is a summary. It may not cover all possible information. If you have questions about this medicine, talk to your doctor, pharmacist, or health care provider.    2016, [employer] Standard. (2014-06-17 10:03:42)

## 2014-11-27 ENCOUNTER — Other Ambulatory Visit: Payer: Self-pay

## 2014-11-27 NOTE — Patient Outreach (Signed)
unsuccessful attempt made to contact patient via telephone at [phone] HIPPA compliant message left for patient with RNCM's  Contact information.   Will make final attempt to contact patient on Monday, October 31 if patient has not returned call by that date.

## 2014-11-30 ENCOUNTER — Other Ambulatory Visit: Payer: Self-pay

## 2014-12-02 ENCOUNTER — Telehealth: Payer: Self-pay | Admitting: Medical Oncology

## 2014-12-02 NOTE — Telephone Encounter (Signed)
Pt states he is doing fine -no problems.

## 2014-12-09 ENCOUNTER — Other Ambulatory Visit: Payer: Self-pay | Admitting: *Deleted

## 2014-12-09 ENCOUNTER — Encounter: Payer: Self-pay | Admitting: Internal Medicine

## 2014-12-09 ENCOUNTER — Ambulatory Visit (INDEPENDENT_AMBULATORY_CARE_PROVIDER_SITE_OTHER): Payer: Commercial Managed Care - HMO | Admitting: Internal Medicine

## 2014-12-09 VITALS — BP 118/70 | HR 68 | Ht 69.0 in | Wt 262.0 lb

## 2014-12-09 DIAGNOSIS — R195 Other fecal abnormalities: Secondary | ICD-10-CM | POA: Diagnosis not present

## 2014-12-09 DIAGNOSIS — C3490 Malignant neoplasm of unspecified part of unspecified bronchus or lung: Secondary | ICD-10-CM
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
## 2014-12-09 NOTE — Patient Instructions (Signed)
Please follow up as needed 

## 2014-12-10 ENCOUNTER — Ambulatory Visit: Payer: Commercial Managed Care - HMO

## 2014-12-10 ENCOUNTER — Other Ambulatory Visit (HOSPITAL_BASED_OUTPATIENT_CLINIC_OR_DEPARTMENT_OTHER): Payer: Commercial Managed Care - HMO

## 2014-12-10 ENCOUNTER — Ambulatory Visit (HOSPITAL_BASED_OUTPATIENT_CLINIC_OR_DEPARTMENT_OTHER): Payer: Commercial Managed Care - HMO

## 2014-12-10 ENCOUNTER — Ambulatory Visit (HOSPITAL_BASED_OUTPATIENT_CLINIC_OR_DEPARTMENT_OTHER): Payer: Commercial Managed Care - HMO | Admitting: Nurse Practitioner

## 2014-12-10 VITALS — BP 108/61 | HR 53 | Temp 97.7°F | Resp 18 | Ht 69.0 in | Wt 262.8 lb

## 2014-12-10 DIAGNOSIS — C3432 Malignant neoplasm of lower lobe, left bronchus or lung: Secondary | ICD-10-CM | POA: Diagnosis not present

## 2014-12-10 DIAGNOSIS — C3490 Malignant neoplasm of unspecified part of unspecified bronchus or lung: Secondary | ICD-10-CM

## 2014-12-10 DIAGNOSIS — Z5112 Encounter for antineoplastic immunotherapy: Secondary | ICD-10-CM | POA: Diagnosis not present

## 2014-12-10 DIAGNOSIS — C3411 Malignant neoplasm of upper lobe, right bronchus or lung: Secondary | ICD-10-CM

## 2014-12-10 LAB — CBC WITH DIFFERENTIAL/PLATELET
BASO%: 0.3 % (ref 0.0–2.0)
Basophils Absolute: 0 10*3/uL (ref 0.0–0.1)
EOS ABS: 0 10*3/uL (ref 0.0–0.5)
EOS%: 0.3 % (ref 0.0–7.0)
HEMATOCRIT: 39.6 % (ref 38.4–49.9)
HGB: 12.1 g/dL — ABNORMAL LOW (ref 13.0–17.1)
LYMPH%: 15.6 % (ref 14.0–49.0)
MCH: 30 pg (ref 27.2–33.4)
MCHC: 30.6 g/dL — AB (ref 32.0–36.0)
MCV: 98.3 fL — ABNORMAL HIGH (ref 79.3–98.0)
MONO#: 0.4 10*3/uL (ref 0.1–0.9)
MONO%: 10.7 % (ref 0.0–14.0)
NEUT#: 2.9 10*3/uL (ref 1.5–6.5)
NEUT%: 73.1 % (ref 39.0–75.0)
Platelets: 161 10*3/uL (ref 140–400)
RBC: 4.03 10*6/uL — AB (ref 4.20–5.82)
RDW: 16.1 % — ABNORMAL HIGH (ref 11.0–14.6)
WBC: 3.9 10*3/uL — AB (ref 4.0–10.3)
lymph#: 0.6 10*3/uL — ABNORMAL LOW (ref 0.9–3.3)

## 2014-12-10 LAB — COMPREHENSIVE METABOLIC PANEL (CC13)
ALT: 12 U/L (ref 0–55)
AST: 24 U/L (ref 5–34)
Albumin: 3.2 g/dL — ABNORMAL LOW (ref 3.5–5.0)
Alkaline Phosphatase: 84 U/L (ref 40–150)
Anion Gap: 9 mEq/L (ref 3–11)
BUN: 49.4 mg/dL — ABNORMAL HIGH (ref 7.0–26.0)
CALCIUM: 9 mg/dL (ref 8.4–10.4)
CHLORIDE: 108 meq/L (ref 98–109)
CO2: 23 meq/L (ref 22–29)
Creatinine: 1.9 mg/dL — ABNORMAL HIGH (ref 0.7–1.3)
EGFR: 42 mL/min/{1.73_m2} — ABNORMAL LOW (ref >90)
Glucose: 82 mg/dl (ref 70–140)
POTASSIUM: 4.9 meq/L (ref 3.5–5.1)
Sodium: 140 mEq/L (ref 136–145)
Total Bilirubin: 0.96 mg/dL (ref 0.20–1.20)
Total Protein: 6.7 g/dL (ref 6.4–8.3)

## 2014-12-10 MED ORDER — HEPARIN SOD (PORK) LOCK FLUSH 100 UNIT/ML IV SOLN
500.0000 [IU] | Freq: Once | INTRAVENOUS | Status: AC | PRN
  Administered 2014-12-10: 500 [IU]
  Filled 2014-12-10: qty 5

## 2014-12-10 MED ORDER — SODIUM CHLORIDE 0.9 % IJ SOLN
10.0000 mL | INTRAMUSCULAR | Status: DC | PRN
  Administered 2014-12-10: 10 mL
  Filled 2014-12-10: qty 10

## 2014-12-10 MED ORDER — SODIUM CHLORIDE 0.9 % IV SOLN
240.0000 mg | Freq: Once | INTRAVENOUS | Status: AC
  Administered 2014-12-10: 240 mg via INTRAVENOUS
  Filled 2014-12-10: qty 20

## 2014-12-10 MED ORDER — SODIUM CHLORIDE 0.9 % IV SOLN
Freq: Once | INTRAVENOUS | Status: AC
  Administered 2014-12-10: 13:00:00 via INTRAVENOUS

## 2014-12-10 NOTE — Progress Notes (Signed)
  [place] OFFICE PROGRESS NOTE   DIAGNOSIS: Recurrent non-small cell lung cancer, adenocarcinoma initially diagnosed as stage IIb (T3, N0, M0) adenocarcinoma with negative EGFR mutation and negative ALK gene translocation initially diagnosed in October of 2013   PRIOR THERAPY:  1) Status post left lower lobectomy on 01/04/2012. The tumor size was 11.5 cm.  2) Adjuvant chemotherapy with cisplatin at 75 mg per meter squared and Alimta 500 mg per meter squared given every 3 weeks, status post 4 cycles, last dose was given on 05/07/2012.  3) Systemic chemotherapy with carboplatin for AUC of 5, paclitaxel 175 mg/M2 and Avastin 15 mg/kg with Neulasta support every 3 weeks. Status post 6 cycles. First cycle was given on 08/13/2012.   CURRENT THERAPY: Immunotherapy with Nivolumab 240 MG IV every 2 weeks, first dose 11/26/2014   INTERVAL HISTORY:   Mr. Train completed cycle 1 nivolumab 11/26/2014. He denies nausea/vomiting. No mouth sores. No diarrhea. No rash. Stable dyspnea on exertion. He continues supplemental oxygen at 2 L/m. No fever or cough. No hemoptysis. Bowels moving regularly. No pain. Good appetite.  Objective:  Vital signs in last 24 hours:  Blood pressure 108/61, pulse 53, temperature 97.7 F (36.5 C), temperature source Oral, resp. rate 18, height $RemoveBe'5\' 9"'WuQVwcCuN$  (1.753 m), weight 262 lb 12.8 oz (119.205 kg), SpO2 100 %.    HEENT:  No thrush or ulcers. Lymphatics:  No palpable cervical or supra-clavicular lymph nodes. Resp:  Lungs with rales at the right base. No respiratory distress. Cardio:  Regular rate and rhythm. GI:  Abdomen soft and nontender. No hepatomegaly. Vascular:  Trace lower leg edema bilaterally. Skin:  No rash. Port-A-Cath without erythema.    Lab Results:  Lab Results  Component Value Date   WBC 3.9* 12/10/2014   HGB 12.1* 12/10/2014   HCT 39.6 12/10/2014   MCV 98.3* 12/10/2014   PLT 161 12/10/2014   NEUTROABS 2.9 12/10/2014     Imaging:  No results found.  Medications: I have reviewed the patient's current medications.  Assessment/Plan: 1. Recurrent non-small cell lung cancer, adenocarcinoma with recent PET showing interval increase in the size and hypermetabolism associated with right paratracheal and AP window lymph nodes and  interval development of a hypermetabolic focus in the L4 spinous process. Treatment with nivolumab initiated 11/26/2014.   Disposition: Mr. Rosko appears stable. He has completed 1 cycle of nivolumab. Plan to proceed with cycle 2 today as scheduled. He will return for follow-up visit and cycle 3 in 2 weeks. He will contact the office in the interim with any problems.    Ned Card ANP/GNP-BC   12/10/2014  12:21 PM

## 2014-12-10 NOTE — Patient Instructions (Signed)
[facility] Discharge Instructions for Patients Receiving Chemotherapy  Today you received the following chemotherapy agents Opdivo.  To help prevent nausea and vomiting after your treatment, we encourage you to take your nausea medication as directed.   If you develop nausea and vomiting that is not controlled by your nausea medication, call the clinic.   BELOW ARE SYMPTOMS THAT SHOULD BE REPORTED IMMEDIATELY:  *FEVER GREATER THAN 100.5 F  *CHILLS WITH OR WITHOUT FEVER  NAUSEA AND VOMITING THAT IS NOT CONTROLLED WITH YOUR NAUSEA MEDICATION  *UNUSUAL SHORTNESS OF BREATH  *UNUSUAL BRUISING OR BLEEDING  TENDERNESS IN MOUTH AND THROAT WITH OR WITHOUT PRESENCE OF ULCERS  *URINARY PROBLEMS  *BOWEL PROBLEMS  UNUSUAL RASH Items with * indicate a potential emergency and should be followed up as soon as possible.  Feel free to call the clinic you have any questions or concerns. The clinic phone number is [phone].  Please show the CHEMO ALERT CARD at check-in to the Emergency Department and triage nurse.    

## 2014-12-17 ENCOUNTER — Other Ambulatory Visit: Payer: Self-pay

## 2014-12-18 NOTE — Patient Outreach (Signed)
[facility]) Care Management  December 17, 2014    John Williamsen [facility] 14-Nov-1945\\ [id number]    Initial home visit to assess need for community care coordination. This RNCM described [facility] benefits and disciplines available to assist with community care coordination needs.   Patient requested assistance with obtaining a more light weight oxygen delivery system.  Patient states he likes [employer]. Patient and this RNCM also discussed Advanced planning.  Patient states would like information on how to go about advanced planning documentation.    Patient stated his wife died in 04-19-2008, his son who lives with him is his primary support. patient is  Independent with ADLs, IADLs but is dependent on son for transportation.   Patient states he knows the severity of his illness and would understand the need to plan  Because he doe snot want decisions to be made by his children because it will be tough enough on them.  Plan: Will request advanced planning information be sent out to patient. Follow up with patient in December to assess need for referral to LCSW for assistance. Marland Kitchen

## 2014-12-22 ENCOUNTER — Other Ambulatory Visit: Payer: Self-pay | Admitting: *Deleted

## 2014-12-22 ENCOUNTER — Other Ambulatory Visit: Payer: Self-pay | Admitting: Emergency Medicine

## 2014-12-22 DIAGNOSIS — C3490 Malignant neoplasm of unspecified part of unspecified bronchus or lung: Secondary | ICD-10-CM

## 2014-12-22 MED ORDER — TIOTROPIUM BROMIDE MONOHYDRATE 18 MCG IN CAPS: 18.0000 ug | ORAL_CAPSULE | Freq: Every day | RESPIRATORY_TRACT | Status: DC

## 2014-12-23 ENCOUNTER — Ambulatory Visit (HOSPITAL_BASED_OUTPATIENT_CLINIC_OR_DEPARTMENT_OTHER): Payer: Commercial Managed Care - HMO | Admitting: Internal Medicine

## 2014-12-23 ENCOUNTER — Telehealth: Payer: Self-pay | Admitting: Internal Medicine

## 2014-12-23 ENCOUNTER — Ambulatory Visit (HOSPITAL_BASED_OUTPATIENT_CLINIC_OR_DEPARTMENT_OTHER): Payer: Commercial Managed Care - HMO

## 2014-12-23 ENCOUNTER — Encounter: Payer: Self-pay | Admitting: Internal Medicine

## 2014-12-23 ENCOUNTER — Other Ambulatory Visit (HOSPITAL_BASED_OUTPATIENT_CLINIC_OR_DEPARTMENT_OTHER): Payer: Commercial Managed Care - HMO

## 2014-12-23 VITALS — BP 102/64 | HR 61 | Temp 97.7°F | Resp 18 | Ht 69.0 in | Wt 266.6 lb

## 2014-12-23 DIAGNOSIS — C3411 Malignant neoplasm of upper lobe, right bronchus or lung: Secondary | ICD-10-CM

## 2014-12-23 DIAGNOSIS — Z79899 Other long term (current) drug therapy: Secondary | ICD-10-CM | POA: Diagnosis not present

## 2014-12-23 DIAGNOSIS — C3432 Malignant neoplasm of lower lobe, left bronchus or lung: Secondary | ICD-10-CM

## 2014-12-23 DIAGNOSIS — C7951 Secondary malignant neoplasm of bone: Secondary | ICD-10-CM

## 2014-12-23 DIAGNOSIS — J441 Chronic obstructive pulmonary disease with (acute) exacerbation: Secondary | ICD-10-CM

## 2014-12-23 DIAGNOSIS — C3410 Malignant neoplasm of upper lobe, unspecified bronchus or lung: Secondary | ICD-10-CM

## 2014-12-23 DIAGNOSIS — Z5112 Encounter for antineoplastic immunotherapy: Secondary | ICD-10-CM

## 2014-12-23 DIAGNOSIS — C3490 Malignant neoplasm of unspecified part of unspecified bronchus or lung: Secondary | ICD-10-CM

## 2014-12-23 LAB — COMPREHENSIVE METABOLIC PANEL (CC13)
ALT: 12 U/L (ref 0–55)
AST: 26 U/L (ref 5–34)
Albumin: 2.9 g/dL — ABNORMAL LOW (ref 3.5–5.0)
Alkaline Phosphatase: 91 U/L (ref 40–150)
Anion Gap: 10 mEq/L (ref 3–11)
BUN: 44 mg/dL — AB (ref 7.0–26.0)
CHLORIDE: 104 meq/L (ref 98–109)
CO2: 23 meq/L (ref 22–29)
CREATININE: 2 mg/dL — AB (ref 0.7–1.3)
Calcium: 8.7 mg/dL (ref 8.4–10.4)
EGFR: 39 mL/min/{1.73_m2} — ABNORMAL LOW (ref >90)
Glucose: 88 mg/dl (ref 70–140)
POTASSIUM: 4.1 meq/L (ref 3.5–5.1)
Sodium: 137 mEq/L (ref 136–145)
Total Bilirubin: 1.13 mg/dL (ref 0.20–1.20)
Total Protein: 6.2 g/dL — ABNORMAL LOW (ref 6.4–8.3)

## 2014-12-23 LAB — CBC WITH DIFFERENTIAL/PLATELET
BASO%: 0.8 % (ref 0.0–2.0)
Basophils Absolute: 0 10*3/uL (ref 0.0–0.1)
EOS%: 1 % (ref 0.0–7.0)
Eosinophils Absolute: 0 10*3/uL (ref 0.0–0.5)
HEMATOCRIT: 38.3 % — AB (ref 38.4–49.9)
HEMOGLOBIN: 12.3 g/dL — AB (ref 13.0–17.1)
LYMPH#: 0.5 10*3/uL — AB (ref 0.9–3.3)
LYMPH%: 12 % — ABNORMAL LOW (ref 14.0–49.0)
MCH: 29.9 pg (ref 27.2–33.4)
MCHC: 32 g/dL (ref 32.0–36.0)
MCV: 93.6 fL (ref 79.3–98.0)
MONO#: 0.6 10*3/uL (ref 0.1–0.9)
MONO%: 15.6 % — ABNORMAL HIGH (ref 0.0–14.0)
NEUT%: 70.6 % (ref 39.0–75.0)
NEUTROS ABS: 2.7 10*3/uL (ref 1.5–6.5)
Platelets: 209 10*3/uL (ref 140–400)
RBC: 4.09 10*6/uL — AB (ref 4.20–5.82)
RDW: 16.9 % — AB (ref 11.0–14.6)
WBC: 3.9 10*3/uL — ABNORMAL LOW (ref 4.0–10.3)

## 2014-12-23 LAB — TSH CHCC: TSH: 1.867 m(IU)/L (ref 0.320–4.118)

## 2014-12-23 MED ORDER — HEPARIN SOD (PORK) LOCK FLUSH 100 UNIT/ML IV SOLN
500.0000 [IU] | Freq: Once | INTRAVENOUS | Status: AC | PRN
  Administered 2014-12-23: 500 [IU]
  Filled 2014-12-23: qty 5

## 2014-12-23 MED ORDER — SODIUM CHLORIDE 0.9 % IV SOLN
240.0000 mg | Freq: Once | INTRAVENOUS | Status: AC
  Administered 2014-12-23: 240 mg via INTRAVENOUS
  Filled 2014-12-23: qty 20

## 2014-12-23 MED ORDER — SODIUM CHLORIDE 0.9 % IJ SOLN
10.0000 mL | INTRAMUSCULAR | Status: DC | PRN
  Administered 2014-12-23: 10 mL
  Filled 2014-12-23: qty 10

## 2014-12-23 MED ORDER — SODIUM CHLORIDE 0.9 % IV SOLN
Freq: Once | INTRAVENOUS | Status: AC
  Administered 2014-12-23: 10:00:00 via INTRAVENOUS

## 2014-12-23 NOTE — Telephone Encounter (Signed)
per pof to sch pt appt-gave pt copy of avs-MW sch trmt-pt req to go to Thursdays
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
## 2014-12-23 NOTE — Progress Notes (Signed)
Per Dr. Julien Nordmann, it's OK to treat with a creatinine of 2.0 today.

## 2014-12-23 NOTE — Patient Outreach (Signed)
[facility]) Care Management  12/23/2014  [name] Jun 14, 1945 [id number]   Request received from Erenest Rasher, RN to send patient a copy of an advance directives packet. Packet sent 12/23/2014.  [name], [name] Care Management Assistant

## 2014-12-23 NOTE — Patient Instructions (Signed)
[facility] Discharge Instructions for Patients Receiving Chemotherapy  Today you received the following chemotherapy agents Nivolumab.  To help prevent nausea and vomiting after your treatment, we encourage you to take your nausea medication as prescribed.   If you develop nausea and vomiting that is not controlled by your nausea medication, call the clinic.   BELOW ARE SYMPTOMS THAT SHOULD BE REPORTED IMMEDIATELY:  *FEVER GREATER THAN 100.5 F  *CHILLS WITH OR WITHOUT FEVER  NAUSEA AND VOMITING THAT IS NOT CONTROLLED WITH YOUR NAUSEA MEDICATION  *UNUSUAL SHORTNESS OF BREATH  *UNUSUAL BRUISING OR BLEEDING  TENDERNESS IN MOUTH AND THROAT WITH OR WITHOUT PRESENCE OF ULCERS  *URINARY PROBLEMS  *BOWEL PROBLEMS  UNUSUAL RASH Items with * indicate a potential emergency and should be followed up as soon as possible.  Feel free to call the clinic you have any questions or concerns. The clinic phone number is [phone].  Please show the CHEMO ALERT CARD at check-in to the Emergency Department and triage nurse.   

## 2014-12-30 ENCOUNTER — Emergency Department (HOSPITAL_COMMUNITY): Payer: Commercial Managed Care - HMO

## 2014-12-30 ENCOUNTER — Encounter (HOSPITAL_COMMUNITY): Payer: Self-pay | Admitting: Neurology

## 2014-12-30 ENCOUNTER — Inpatient Hospital Stay (HOSPITAL_COMMUNITY)
Admission: EM | Admit: 2014-12-30 | Discharge: 2015-01-05 | DRG: 291 | Disposition: A | Discharge status: Home / Self Care | Payer: Commercial Managed Care - HMO | Attending: Family Medicine | Admitting: Family Medicine

## 2014-12-30 DIAGNOSIS — J9621 Acute and chronic respiratory failure with hypoxia: Secondary | ICD-10-CM | POA: Diagnosis present

## 2014-12-30 DIAGNOSIS — E785 Hyperlipidemia, unspecified: Secondary | ICD-10-CM | POA: Diagnosis present

## 2014-12-30 DIAGNOSIS — R778 Other specified abnormalities of plasma proteins: Secondary | ICD-10-CM

## 2014-12-30 DIAGNOSIS — J8410 Pulmonary fibrosis, unspecified: Secondary | ICD-10-CM | POA: Diagnosis present

## 2014-12-30 DIAGNOSIS — I9589 Other hypotension: Secondary | ICD-10-CM | POA: Diagnosis present

## 2014-12-30 DIAGNOSIS — Z801 Family history of malignant neoplasm of trachea, bronchus and lung: Secondary | ICD-10-CM

## 2014-12-30 DIAGNOSIS — I248 Other forms of acute ischemic heart disease: Secondary | ICD-10-CM | POA: Diagnosis present

## 2014-12-30 DIAGNOSIS — R0600 Dyspnea, unspecified: Secondary | ICD-10-CM

## 2014-12-30 DIAGNOSIS — I959 Hypotension, unspecified: Secondary | ICD-10-CM | POA: Diagnosis not present

## 2014-12-30 DIAGNOSIS — I5043 Acute on chronic combined systolic (congestive) and diastolic (congestive) heart failure: Principal | ICD-10-CM | POA: Diagnosis present

## 2014-12-30 DIAGNOSIS — R011 Cardiac murmur, unspecified: Secondary | ICD-10-CM | POA: Diagnosis present

## 2014-12-30 DIAGNOSIS — C3490 Malignant neoplasm of unspecified part of unspecified bronchus or lung: Secondary | ICD-10-CM | POA: Diagnosis present

## 2014-12-30 DIAGNOSIS — I872 Venous insufficiency (chronic) (peripheral): Secondary | ICD-10-CM | POA: Diagnosis present

## 2014-12-30 DIAGNOSIS — N179 Acute kidney failure, unspecified: Secondary | ICD-10-CM | POA: Diagnosis present

## 2014-12-30 DIAGNOSIS — R7989 Other specified abnormal findings of blood chemistry: Secondary | ICD-10-CM

## 2014-12-30 DIAGNOSIS — C3431 Malignant neoplasm of lower lobe, right bronchus or lung: Secondary | ICD-10-CM

## 2014-12-30 DIAGNOSIS — G4700 Insomnia, unspecified: Secondary | ICD-10-CM | POA: Diagnosis present

## 2014-12-30 DIAGNOSIS — Z79899 Other long term (current) drug therapy: Secondary | ICD-10-CM | POA: Diagnosis not present

## 2014-12-30 DIAGNOSIS — I5031 Acute diastolic (congestive) heart failure: Secondary | ICD-10-CM

## 2014-12-30 DIAGNOSIS — I272 Other secondary pulmonary hypertension: Secondary | ICD-10-CM | POA: Diagnosis present

## 2014-12-30 DIAGNOSIS — R7302 Impaired glucose tolerance (oral): Secondary | ICD-10-CM | POA: Diagnosis present

## 2014-12-30 DIAGNOSIS — Z66 Do not resuscitate: Secondary | ICD-10-CM

## 2014-12-30 DIAGNOSIS — I2510 Atherosclerotic heart disease of native coronary artery without angina pectoris: Secondary | ICD-10-CM | POA: Diagnosis present

## 2014-12-30 DIAGNOSIS — Z87891 Personal history of nicotine dependence: Secondary | ICD-10-CM | POA: Diagnosis not present

## 2014-12-30 DIAGNOSIS — N184 Chronic kidney disease, stage 4 (severe): Secondary | ICD-10-CM | POA: Diagnosis present

## 2014-12-30 DIAGNOSIS — E876 Hypokalemia: Secondary | ICD-10-CM | POA: Diagnosis present

## 2014-12-30 DIAGNOSIS — J441 Chronic obstructive pulmonary disease with (acute) exacerbation: Secondary | ICD-10-CM | POA: Diagnosis present

## 2014-12-30 DIAGNOSIS — Z6838 Body mass index (BMI) 38.0-38.9, adult: Secondary | ICD-10-CM | POA: Diagnosis not present

## 2014-12-30 DIAGNOSIS — Z825 Family history of asthma and other chronic lower respiratory diseases: Secondary | ICD-10-CM

## 2014-12-30 DIAGNOSIS — Z9981 Dependence on supplemental oxygen: Secondary | ICD-10-CM | POA: Diagnosis not present

## 2014-12-30 DIAGNOSIS — I129 Hypertensive chronic kidney disease with stage 1 through stage 4 chronic kidney disease, or unspecified chronic kidney disease: Secondary | ICD-10-CM | POA: Diagnosis present

## 2014-12-30 DIAGNOSIS — E873 Alkalosis: Secondary | ICD-10-CM | POA: Diagnosis not present

## 2014-12-30 DIAGNOSIS — Z7982 Long term (current) use of aspirin: Secondary | ICD-10-CM

## 2014-12-30 DIAGNOSIS — G4733 Obstructive sleep apnea (adult) (pediatric): Secondary | ICD-10-CM | POA: Diagnosis present

## 2014-12-30 DIAGNOSIS — N189 Chronic kidney disease, unspecified: Secondary | ICD-10-CM

## 2014-12-30 LAB — BRAIN NATRIURETIC PEPTIDE: B NATRIURETIC PEPTIDE 5: 1096.9 pg/mL — AB (ref 0.0–100.0)

## 2014-12-30 LAB — BASIC METABOLIC PANEL
ANION GAP: 9 (ref 5–15)
BUN: 68 mg/dL — ABNORMAL HIGH (ref 6–20)
CALCIUM: 8.3 mg/dL — AB (ref 8.9–10.3)
CHLORIDE: 99 mmol/L — AB (ref 101–111)
CO2: 29 mmol/L (ref 22–32)
Creatinine, Ser: 2.4 mg/dL — ABNORMAL HIGH (ref 0.61–1.24)
GFR calc non Af Amer: 26 mL/min — ABNORMAL LOW (ref >60)
GFR, EST AFRICAN AMERICAN: 30 mL/min — AB (ref >60)
GLUCOSE: 99 mg/dL (ref 65–99)
Potassium: 3.8 mmol/L (ref 3.5–5.1)
Sodium: 137 mmol/L (ref 135–145)

## 2014-12-30 LAB — DIGOXIN LEVEL: DIGOXIN LVL: 1.2 ng/mL (ref 0.8–2.0)

## 2014-12-30 LAB — CBC WITH DIFFERENTIAL/PLATELET
BASOS ABS: 0 10*3/uL (ref 0.0–0.1)
BASOS PCT: 0 %
EOS ABS: 0.1 10*3/uL (ref 0.0–0.7)
Eosinophils Relative: 1 %
HCT: 38.5 % — ABNORMAL LOW (ref 39.0–52.0)
HEMOGLOBIN: 11.9 g/dL — AB (ref 13.0–17.0)
LYMPHS PCT: 8 %
Lymphs Abs: 0.5 10*3/uL — ABNORMAL LOW (ref 0.7–4.0)
MCH: 29.5 pg (ref 26.0–34.0)
MCHC: 30.9 g/dL (ref 30.0–36.0)
MCV: 95.3 fL (ref 78.0–100.0)
MONO ABS: 0.9 10*3/uL (ref 0.1–1.0)
Monocytes Relative: 14 %
NEUTROS PCT: 77 %
Neutro Abs: 4.6 10*3/uL (ref 1.7–7.7)
PLATELETS: 190 10*3/uL (ref 150–400)
RBC: 4.04 MIL/uL — AB (ref 4.22–5.81)
RDW: 16.9 % — ABNORMAL HIGH (ref 11.5–15.5)
WBC: 6.1 10*3/uL (ref 4.0–10.5)

## 2014-12-30 LAB — I-STAT TROPONIN, ED: Troponin i, poc: 0.24 ng/mL (ref 0.00–0.08)

## 2014-12-30 LAB — TROPONIN I
TROPONIN I: 0.26 ng/mL — AB (ref <0.031)
Troponin I: 0.27 ng/mL — ABNORMAL HIGH (ref <0.031)
Troponin I: 0.25 ng/mL — ABNORMAL HIGH (ref <0.031)

## 2014-12-30 LAB — I-STAT CG4 LACTIC ACID, ED: Lactic Acid, Venous: 1.21 mmol/L (ref 0.5–2.0)

## 2014-12-30 MED ORDER — DIGOXIN 125 MCG PO TABS: 0.1250 mg | ORAL_TABLET | Freq: Every day | ORAL | Status: DC

## 2014-12-30 MED ORDER — SODIUM CHLORIDE 0.9 % IJ SOLN: 3.0000 mL | INTRAMUSCULAR | Status: DC | PRN

## 2014-12-30 MED ORDER — ACETAMINOPHEN 325 MG PO TABS: 650.0000 mg | ORAL_TABLET | Freq: Four times a day (QID) | ORAL | Status: DC | PRN

## 2014-12-30 MED ORDER — IPRATROPIUM-ALBUTEROL 0.5-2.5 (3) MG/3ML IN SOLN
3.0000 mL | Freq: Once | RESPIRATORY_TRACT | Status: AC
  Administered 2014-12-30: 3 mL via RESPIRATORY_TRACT
  Filled 2014-12-30: qty 3

## 2014-12-30 MED ORDER — HEPARIN SODIUM (PORCINE) 5000 UNIT/ML IJ SOLN
5000.0000 [IU] | Freq: Three times a day (TID) | INTRAMUSCULAR | Status: DC
  Administered 2014-12-30 – 2015-01-05 (×18): 5000 [IU] via SUBCUTANEOUS
  Filled 2014-12-30 (×18): qty 1

## 2014-12-30 MED ORDER — LEVOFLOXACIN IN D5W 750 MG/150ML IV SOLN
750.0000 mg | INTRAVENOUS | Status: DC
  Administered 2014-12-30 – 2015-01-03 (×3): 750 mg via INTRAVENOUS
  Filled 2014-12-30 (×4): qty 150

## 2014-12-30 MED ORDER — PREDNISONE 20 MG PO TABS
60.0000 mg | ORAL_TABLET | Freq: Every day | ORAL | Status: DC
  Administered 2014-12-31 – 2015-01-01 (×2): 60 mg via ORAL
  Filled 2014-12-30 (×2): qty 3

## 2014-12-30 MED ORDER — FUROSEMIDE 10 MG/ML IJ SOLN
40.0000 mg | Freq: Every day | INTRAMUSCULAR | Status: DC
  Administered 2014-12-30 – 2014-12-31 (×2): 40 mg via INTRAVENOUS
  Filled 2014-12-30 (×2): qty 4

## 2014-12-30 MED ORDER — ACETAMINOPHEN 650 MG RE SUPP: 650.0000 mg | Freq: Four times a day (QID) | RECTAL | Status: DC | PRN

## 2014-12-30 MED ORDER — GUAIFENESIN ER 600 MG PO TB12
1200.0000 mg | ORAL_TABLET | Freq: Two times a day (BID) | ORAL | Status: DC
  Administered 2014-12-30 – 2015-01-05 (×13): 1200 mg via ORAL
  Filled 2014-12-30 (×13): qty 2

## 2014-12-30 MED ORDER — POTASSIUM CHLORIDE CRYS ER 20 MEQ PO TBCR
20.0000 meq | EXTENDED_RELEASE_TABLET | Freq: Two times a day (BID) | ORAL | Status: DC
  Administered 2014-12-30 – 2014-12-31 (×3): 20 meq via ORAL
  Filled 2014-12-30 (×4): qty 1

## 2014-12-30 MED ORDER — ASPIRIN EC 81 MG PO TBEC
81.0000 mg | DELAYED_RELEASE_TABLET | Freq: Every day | ORAL | Status: DC
  Administered 2014-12-31 – 2015-01-05 (×6): 81 mg via ORAL
  Filled 2014-12-30 (×6): qty 1

## 2014-12-30 MED ORDER — SODIUM CHLORIDE 0.9 % IV SOLN: 250.0000 mL | INTRAVENOUS | Status: DC | PRN

## 2014-12-30 MED ORDER — ASPIRIN 81 MG PO CHEW
324.0000 mg | CHEWABLE_TABLET | Freq: Once | ORAL | Status: AC
  Administered 2014-12-30: 324 mg via ORAL
  Filled 2014-12-30: qty 4

## 2014-12-30 MED ORDER — SODIUM CHLORIDE 0.9 % IV BOLUS (SEPSIS)
500.0000 mL | Freq: Once | INTRAVENOUS | Status: AC
  Administered 2014-12-30: 500 mL via INTRAVENOUS

## 2014-12-30 MED ORDER — ONDANSETRON HCL 4 MG/2ML IJ SOLN: 4.0000 mg | Freq: Four times a day (QID) | INTRAMUSCULAR | Status: DC | PRN

## 2014-12-30 MED ORDER — ALLOPURINOL 100 MG PO TABS: 100.0000 mg | ORAL_TABLET | Freq: Every day | ORAL | Status: DC

## 2014-12-30 MED ORDER — TIOTROPIUM BROMIDE MONOHYDRATE 18 MCG IN CAPS
18.0000 ug | ORAL_CAPSULE | Freq: Every day | RESPIRATORY_TRACT | Status: DC
  Administered 2014-12-31: 18 ug via RESPIRATORY_TRACT
  Filled 2014-12-30: qty 5

## 2014-12-30 MED ORDER — ALLOPURINOL 100 MG PO TABS
100.0000 mg | ORAL_TABLET | Freq: Every day | ORAL | Status: DC
  Administered 2014-12-31 – 2015-01-05 (×6): 100 mg via ORAL
  Filled 2014-12-30 (×6): qty 1

## 2014-12-30 MED ORDER — METHYLPREDNISOLONE SODIUM SUCC 125 MG IJ SOLR
125.0000 mg | Freq: Once | INTRAMUSCULAR | Status: AC
  Administered 2014-12-30: 125 mg via INTRAVENOUS
  Filled 2014-12-30: qty 2

## 2014-12-30 MED ORDER — SODIUM CHLORIDE 0.9 % IJ SOLN
3.0000 mL | Freq: Two times a day (BID) | INTRAMUSCULAR | Status: DC
  Administered 2014-12-30 – 2015-01-03 (×6): 3 mL via INTRAVENOUS

## 2014-12-30 MED ORDER — ONDANSETRON HCL 4 MG PO TABS: 4.0000 mg | ORAL_TABLET | Freq: Four times a day (QID) | ORAL | Status: DC | PRN

## 2014-12-30 MED ORDER — SODIUM CHLORIDE 0.9 % IJ SOLN
3.0000 mL | Freq: Two times a day (BID) | INTRAMUSCULAR | Status: DC
  Administered 2014-12-30 – 2015-01-04 (×2): 3 mL via INTRAVENOUS

## 2014-12-30 MED ORDER — POTASSIUM CHLORIDE CRYS ER 20 MEQ PO TBCR: 20.0000 meq | EXTENDED_RELEASE_TABLET | Freq: Two times a day (BID) | ORAL | Status: DC

## 2014-12-30 MED ORDER — TORSEMIDE 20 MG PO TABS
20.0000 mg | ORAL_TABLET | Freq: Two times a day (BID) | ORAL | Status: DC
  Administered 2014-12-30 – 2014-12-31 (×2): 20 mg via ORAL
  Filled 2014-12-30 (×2): qty 1
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
## 2014-12-30 NOTE — ED Notes (Signed)
Pt reports sob for 1 week has lung cancer is getting chemo. Also reports cp.

## 2014-12-30 NOTE — Progress Notes (Signed)
Admission note:  Arrival Method: pt arrived form the ER on a stretcher  Mental Orientation:alert and oriented X4  Telemetry:placed on cardiac monitor CCMD notified Assessment: see flowsheet Skin: warm, dr and intact. Assessment done by this RN and Alain Honey, RN IV: Clean and dry saline locked Pain: pt did not complain of any pain Tubes: N/A Safety Measures:  Fall Prevention Safety Plan: Non skid socks , call light within reach, low bed Admission Screening: completed 6700 Orientation: Patient has been oriented to the unit, staff and to the room. Call light within reach, sitting quietly in a recliner will continue to monitor. Oren Beckmann, RN
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
## 2014-12-31 DIAGNOSIS — N189 Chronic kidney disease, unspecified: Secondary | ICD-10-CM

## 2014-12-31 DIAGNOSIS — I9589 Other hypotension: Secondary | ICD-10-CM

## 2014-12-31 DIAGNOSIS — N179 Acute kidney failure, unspecified: Secondary | ICD-10-CM

## 2014-12-31 DIAGNOSIS — J441 Chronic obstructive pulmonary disease with (acute) exacerbation: Secondary | ICD-10-CM

## 2014-12-31 DIAGNOSIS — R7989 Other specified abnormal findings of blood chemistry: Secondary | ICD-10-CM

## 2014-12-31 DIAGNOSIS — C3490 Malignant neoplasm of unspecified part of unspecified bronchus or lung: Secondary | ICD-10-CM

## 2014-12-31 DIAGNOSIS — R0600 Dyspnea, unspecified: Secondary | ICD-10-CM

## 2014-12-31 LAB — BASIC METABOLIC PANEL
Anion gap: 9 (ref 5–15)
BUN: 72 mg/dL — AB (ref 6–20)
CALCIUM: 8 mg/dL — AB (ref 8.9–10.3)
CO2: 29 mmol/L (ref 22–32)
CREATININE: 2.17 mg/dL — AB (ref 0.61–1.24)
Chloride: 97 mmol/L — ABNORMAL LOW (ref 101–111)
GFR calc Af Amer: 34 mL/min — ABNORMAL LOW (ref >60)
GFR, EST NON AFRICAN AMERICAN: 29 mL/min — AB (ref >60)
Glucose, Bld: 167 mg/dL — ABNORMAL HIGH (ref 65–99)
POTASSIUM: 3.6 mmol/L (ref 3.5–5.1)
SODIUM: 135 mmol/L (ref 135–145)

## 2014-12-31 LAB — CBC
HCT: 35.4 % — ABNORMAL LOW (ref 39.0–52.0)
Hemoglobin: 11 g/dL — ABNORMAL LOW (ref 13.0–17.0)
MCH: 29.5 pg (ref 26.0–34.0)
MCHC: 31.1 g/dL (ref 30.0–36.0)
MCV: 94.9 fL (ref 78.0–100.0)
PLATELETS: 173 10*3/uL (ref 150–400)
RBC: 3.73 MIL/uL — ABNORMAL LOW (ref 4.22–5.81)
RDW: 16.6 % — AB (ref 11.5–15.5)
WBC: 2.6 10*3/uL — AB (ref 4.0–10.5)

## 2014-12-31 LAB — GLUCOSE, CAPILLARY: Glucose-Capillary: 127 mg/dL — ABNORMAL HIGH (ref 65–99)

## 2014-12-31 LAB — PROTIME-INR
INR: 1.34 (ref 0.00–1.49)
PROTHROMBIN TIME: 16.7 s — AB (ref 11.6–15.2)

## 2014-12-31 LAB — DIGOXIN LEVEL: DIGOXIN LVL: 1 ng/mL (ref 0.8–2.0)

## 2014-12-31 LAB — TROPONIN I: TROPONIN I: 0.18 ng/mL — AB (ref <0.031)

## 2014-12-31 MED ORDER — IPRATROPIUM-ALBUTEROL 0.5-2.5 (3) MG/3ML IN SOLN
3.0000 mL | Freq: Four times a day (QID) | RESPIRATORY_TRACT | Status: DC
  Administered 2014-12-31 (×2): 3 mL via RESPIRATORY_TRACT
  Filled 2014-12-31: qty 3

## 2014-12-31 MED ORDER — IPRATROPIUM-ALBUTEROL 0.5-2.5 (3) MG/3ML IN SOLN
3.0000 mL | Freq: Three times a day (TID) | RESPIRATORY_TRACT | Status: DC
  Administered 2015-01-01 – 2015-01-05 (×13): 3 mL via RESPIRATORY_TRACT
  Filled 2014-12-31 (×12): qty 3

## 2014-12-31 MED ORDER — FUROSEMIDE 10 MG/ML IJ SOLN
40.0000 mg | Freq: Two times a day (BID) | INTRAMUSCULAR | Status: DC
  Administered 2014-12-31 – 2015-01-01 (×2): 40 mg via INTRAVENOUS
  Filled 2014-12-31 (×2): qty 4

## 2014-12-31 MED ORDER — DIGOXIN 125 MCG PO TABS
0.0625 mg | ORAL_TABLET | Freq: Every day | ORAL | Status: DC
Start: 2015-01-01 — End: 2015-01-05
  Administered 2015-01-01 – 2015-01-05 (×5): 0.0625 mg via ORAL
  Filled 2014-12-31 (×5): qty 1

## 2014-12-31 NOTE — Progress Notes (Signed)
PT Cancellation Note  Patient Details Name: [name] MRN: [id number] DOB: 1945-12-18   Cancelled Treatment:    Reason Eval/Treat Not Completed: Patient declined, no reason specified. Pt states he is "not feeling it today" and closes eyes while therapist is attempting conversation with him. Will return to complete PT eval as schedule allows.   Rolinda Roan 12/31/2014, 2:20 PM   Rolinda Roan, PT, DPT Acute Rehabilitation Services Pager: [phone]

## 2014-12-31 NOTE — Progress Notes (Signed)
Patient sitting up in chair, no distress or pain at this time. Call light within reach.

## 2014-12-31 NOTE — Progress Notes (Signed)
Patient resting comfortably.  No distress or pain tonight.  Preferred to sleep in the recliner, call light within reach.

## 2014-12-31 NOTE — Care Management Note (Addendum)
Case Management Note  Patient Details  Name: [name] MRN: [id number] Date of Birth: [birth year]  Subjective/Objective:    Pt admitted with acute dyspnea, pt has lung cancer and is currently receiving treatments                Action/Plan:  Pt is from home alone with close family support; son bedside during assessment.     Expected Discharge Date:                  Expected Discharge Plan:  Home/Self Care (Pt is active with [employer] for oxygen.  Pt is independent at home alone but has strong family support, son at bedside)  In-House Referral:     Discharge planning Services  CM Consult  Post Acute Care Choice:    Choice offered to:     DME Arranged:  Oxygen DME Agency:  [place]  HH Arranged:    [place] Agency:     Status of Service:  In process, will continue to follow  Medicare Important Message Given:    Date Medicare IM Given:    Medicare IM give by:    Date Additional Medicare IM Given:    Additional Medicare Important Message give by:     If discussed at [place] of Stay Meetings, dates discussed:    Additional Comments: CM assessed pt.  Pt states he is from home alone and uses [employer] for home O2.  CM will contact agency to determine if resumption orders are needed.  CM performed HF assessment; pt states he has a scale and tries to weigh himself daily, pt stated he adheres to low sodium diet.  CM educated pt on importance of adherence to low sodium diet with HD.   CM will continue to monitor for disposition needs. Maryclare Labrador, RN 12/31/2014, 12:00 PM

## 2014-12-31 NOTE — Consult Note (Signed)
   [facility] Inpatient Consult   12/31/2014  Valdemar Mcclenahan [facility] 1945-04-15 [id number] Patient is currently active with [place] [facility] for chronic disease management services as a benefit of his [facility] Medicare insurance.  Patient has been engaged by a [employer].  Our community based plan of care has focused on disease management and community resource support.  Patient will receive a post discharge transition of care call and will be evaluated for monthly home visits for assessments and disease process education and ongoing needs pending his post hospital disposition.  Will make Inpatient Case Manager aware that [place] [facility] following. Of note, [facility] Care Management services does not replace or interfere with any services that are needed or arranged by inpatient case management or social work.  For additional questions or referrals please contact: Natividad Brood, RN BSN [place] [facility] Liaison  [phone] business mobile phone

## 2014-12-31 NOTE — Progress Notes (Signed)
[facility] PROGRESS NOTE  [name] EYC:[id number] DOB: 09-16-1945 DOA: 12/30/2014 PCP: [name], MD  Assessment/Plan: 1. Acute hypoxic respiratory failure- multifactorial from COPD, CHF, lung tumor status post lobectomy, ongoing chemotherapy. Continue prednisone, DuoNeb nebulizers every 6 hours, Levaquin, IV Lasix will change the Lasix to 40 mg every 12 hours. Hold torsemide at this time. 2. Elevated troponin- likely from the demand ischemia from above. Troponin 0.27 > 0.25> 0.18. Will consult cardiology for further recommendations. 3. Acute on chronic diastolic CHF- patient's last echo showed EF 50-55% and grade 1 diastolic dysfunction. Continue diuresis. Dig level I.2 change digoxin to 0.0625 mg by mouth daily. 4. 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 Currently patient undergoing chemotherapy. Followed by oncology as outpatient. 5. Acute on chronic kidney disease stage 3- creatinine improving with diuresis today creatinine is 2.17 6. Hypotension- ? Cause patient on  multiple antihypertensive medications which are on hold.  7. DVT prophylaxis- heparin  Code Status: Full code Family Communication: No family present at bedside Disposition Plan: Pending improvement in CHF, patient on IV Lasix for diuresis   Consultants:  None  Procedures:  None  Antibiotics:  None  HPI/Subjective: 69 y.o. male with baseline shortness of breath secondary to his ongoing lung cancer and ever since he had a left lobectomy in 2013. Patient is on 2 L nasal cannula at baseline at home. Over the last 1-2 days as home O2 was increased to 3 L due to worsening of her shortness of breath which is associated with cough and sputum production. Last chemotherapy was on 12/23/2014. Patient is lower extremity edema slowly been worsening over the course of the last couple of months. Patient's next chemotherapy  scheduled for next week. Denies any fevers, rash, change in mentation. Patient home Spiriva without any other home respiratory treatments.  This morning patient feels better. Leg swelling has improved with IV diuresis.  Objective: Filed Vitals:   12/31/14 0853 12/31/14 1313  BP:  87/47  Pulse: 62 74  Temp:  97.5 F (36.4 C)  Resp: 17 16    Intake/Output Summary (Last 24 hours) at 12/31/14 1321 Last data filed at 12/31/14 1300  Gross per 24 hour  Intake    840 ml  Output   2680 ml  Net  -1840 ml   Filed Weights   12/31/14 1155  Weight: 120.566 kg (265 lb 12.8 oz)    Exam:   General:  Appears in no acute distress  Cardiovascular: S1-S2 regular, no murmurs  Respiratory: Bilateral rhonchi  Abdomen: Soft, nontender, no organomegaly  Musculoskeletal: Bilateral 2+ pitting edema   Data Reviewed: Basic Metabolic Panel:  Recent Labs Lab 12/30/14 1111 12/31/14 0230  NA 137 135  K 3.8 3.6  CL 99* 97*  CO2 29 29  GLUCOSE 99 167*  BUN 68* 72*  CREATININE 2.40* 2.17*  CALCIUM 8.3* 8.0*   Liver Function Tests: No results for input(s): AST, ALT, ALKPHOS, BILITOT, PROT, ALBUMIN in the last 168 hours. No results for input(s): LIPASE, AMYLASE in the last 168 hours. No results for input(s): AMMONIA in the last 168 hours. CBC:  Recent Labs Lab 12/30/14 1111 12/31/14 0230  WBC 6.1 2.6*  NEUTROABS 4.6  --   HGB 11.9* 11.0*  HCT 38.5* 35.4*  MCV 95.3 94.9  PLT 190 173   Cardiac Enzymes:  Recent Labs Lab 12/30/14 1324 12/30/14 1526 12/30/14 2040 12/31/14 0230  TROPONINI 0.26* 0.27* 0.25*  0.18*   BNP (last 3 results)  Recent Labs  04/30/14 0522 10/22/14 1320 12/30/14 1214  BNP 681.5* 451.1* 1096.9*    ProBNP (last 3 results) No results for input(s): PROBNP in the last 8760 hours.  CBG: No results for input(s): GLUCAP in the last 168 hours.  No results found for this or any previous visit (from the past 240 hour(s)).   Studies: Dg Chest  Portable 1 View  12/30/2014  CLINICAL DATA:  Shortness of breath.  History of lung carcinoma EXAM: PORTABLE CHEST 1 VIEW COMPARISON:  Chest radiograph October 22, 2014; PET-CT November 18, 2014 FINDINGS: There is airspace consolidation in the left mid lower lung zones with small left effusion. There is consolidation in the medial right base as well. The heart is enlarged with pulmonary vascularity within normal limits. No adenopathy is apparent by radiography. Port-A-Cath tip is in the left innominate vein. No pneumothorax. No bone lesions. IMPRESSION: Persistent consolidation medial right base. Patchy consolidation left mid and lower lung zones with small left effusion. Stable cardiomegaly. Electronically Signed   By: Lowella Grip III M.D.   On: 12/30/2014 12:06    Scheduled Meds: . allopurinol  100 mg Oral Daily  . aspirin EC  81 mg Oral Daily  . [START ON 01/01/2015] digoxin  0.0625 mg Oral Daily  . furosemide  40 mg Intravenous Q12H  . guaiFENesin  1,200 mg Oral BID  . heparin  5,000 Units Subcutaneous 3 times per day  . levofloxacin (LEVAQUIN) IV  750 mg Intravenous Q48H  . potassium chloride SA  20 mEq Oral BID  . predniSONE  60 mg Oral Q breakfast  . sodium chloride  3 mL Intravenous Q12H  . sodium chloride  3 mL Intravenous Q12H  . tiotropium  18 mcg Inhalation Daily   Continuous Infusions:   Principal Problem:   Acute dyspnea Active Problems:   Lung cancer (HCC)   COPD exacerbation (HCC)   Acute diastolic (congestive) heart failure (HCC)   Acute-on-chronic kidney injury ([place])   Hypotension   Troponin level elevated    Time spent: [place] [facility] Pager [phone]. If 7PM-7AM, please contact night-coverage at [email or web address], password [facility] 12/31/2014, 1:21 PM  LOS: 1 day
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
## 2015-01-01 ENCOUNTER — Other Ambulatory Visit: Payer: Self-pay

## 2015-01-01 DIAGNOSIS — I5031 Acute diastolic (congestive) heart failure: Secondary | ICD-10-CM

## 2015-01-01 LAB — BASIC METABOLIC PANEL
Anion gap: 11 (ref 5–15)
BUN: 78 mg/dL — AB (ref 6–20)
CHLORIDE: 94 mmol/L — AB (ref 101–111)
CO2: 31 mmol/L (ref 22–32)
Calcium: 8.5 mg/dL — ABNORMAL LOW (ref 8.9–10.3)
Creatinine, Ser: 2.1 mg/dL — ABNORMAL HIGH (ref 0.61–1.24)
GFR calc Af Amer: 35 mL/min — ABNORMAL LOW (ref >60)
GFR calc non Af Amer: 30 mL/min — ABNORMAL LOW (ref >60)
Glucose, Bld: 116 mg/dL — ABNORMAL HIGH (ref 65–99)
POTASSIUM: 3.3 mmol/L — AB (ref 3.5–5.1)
SODIUM: 136 mmol/L (ref 135–145)

## 2015-01-01 LAB — TROPONIN I: TROPONIN I: 0.15 ng/mL — AB (ref <0.031)

## 2015-01-01 MED ORDER — SODIUM CHLORIDE 0.9 % IV SOLN
INTRAVENOUS | Status: DC
  Administered 2015-01-01: 14:00:00 via INTRAVENOUS

## 2015-01-01 MED ORDER — POTASSIUM CHLORIDE CRYS ER 20 MEQ PO TBCR
40.0000 meq | EXTENDED_RELEASE_TABLET | Freq: Two times a day (BID) | ORAL | Status: DC
  Administered 2015-01-01 (×2): 40 meq via ORAL
  Filled 2015-01-01: qty 2

## 2015-01-01 MED ORDER — PREDNISONE 20 MG PO TABS
40.0000 mg | ORAL_TABLET | Freq: Every day | ORAL | Status: DC
  Administered 2015-01-02 – 2015-01-05 (×4): 40 mg via ORAL
  Filled 2015-01-01 (×4): qty 2

## 2015-01-01 NOTE — Progress Notes (Signed)
Patient rested well throughout the night.  States that he slept better last night with less coughing.  Call light within reach.

## 2015-01-01 NOTE — Progress Notes (Signed)
[facility] PROGRESS NOTE  [name] YHC:[id number] DOB: August 07, 1945 DOA: 12/30/2014 PCP: [name], MD  Assessment/Plan: 1. Acute hypoxic respiratory failure- multifactorial from COPD, CHF, lung tumor status post lobectomy, ongoing chemotherapy. Continue prednisone, will cut down the dose of prednisone to 40 mg by mouth daily, DuoNeb nebulizers every 6 hours, Levaquin, will hold the Lasix at this time due to hypotension. 2. Hypotension- patient has persistent hypotension with systolic blood pressure in 80s. Will hold Lasix at this time and start IV normal saline at 100 Ml per hour for 3 hours. Patient's echocardiogram shows severe right heart failure. 3. Elevated troponin- likely from the demand ischemia from above. Troponin 0.27 > 0.25> 0.18. No further testing recommended per cardiology 4. Acute on chronic diastolic CHF- patient's last echo showed EF 50-55% and grade 1 diastolic dysfunction. Continue diuresis. Dig level I.2 change digoxin to 0.0625 mg by mouth daily. 5. 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 Currently patient undergoing chemotherapy. Followed by oncology as outpatient. 6. Acute on chronic kidney disease stage 3- creatinine improving with diuresis today creatinine is 2.10 7. Hypotension- ? Cause patient on  multiple antihypertensive medications which are on hold.  8. DVT prophylaxis- heparin was complicated  Code Status: Full code Family Communication: No family present at bedside Disposition Plan: Pending improvement in CHF, patient on IV Lasix for diuresis   Consultants:  None  Procedures:  None  Antibiotics:  None  HPI/Subjective: 69 y.o. male with baseline shortness of breath secondary to his ongoing lung cancer and ever since he had a left lobectomy in 2013. Patient is on 2 L nasal cannula at baseline at home. Over the last 1-2 days as home O2 was increased  to 3 L due to worsening of her shortness of breath which is associated with cough and sputum production. Last chemotherapy was on 12/23/2014. Patient is lower extremity edema slowly been worsening over the course of the last couple of months. Patient's next chemotherapy scheduled for next week. Denies any fevers, rash, change in mentation. Patient home Spiriva without any other home respiratory treatments.  This morning patient feels better. Leg swelling has improved with IV diuresis.  Objective: Filed Vitals:   01/01/15 0544 01/01/15 0942  BP: 86/54   Pulse: 67 68  Temp: 97.9 F (36.6 C)   Resp: 18     Intake/Output Summary (Last 24 hours) at 01/01/15 1318 Last data filed at 01/01/15 1100  Gross per 24 hour  Intake   1663 ml  Output   3800 ml  Net  -2137 ml   Filed Weights   12/31/14 1155  Weight: 120.566 kg (265 lb 12.8 oz)    Exam:   General:  Appears in no acute distress  Cardiovascular: S1-S2 regular, no murmurs  Respiratory: Bilateral rhonchi  Abdomen: Soft, nontender, no organomegaly  Musculoskeletal: Bilateral 2+ pitting edema   Data Reviewed: Basic Metabolic Panel:  Recent Labs Lab 12/30/14 1111 12/31/14 0230 01/01/15 0457  NA 137 135 136  K 3.8 3.6 3.3*  CL 99* 97* 94*  CO2 '29 29 31  '$ GLUCOSE 99 167* 116*  BUN 68* 72* 78*  CREATININE 2.40* 2.17* 2.10*  CALCIUM 8.3* 8.0* 8.5*   Liver Function Tests: No results for input(s): AST, ALT, ALKPHOS, BILITOT, PROT, ALBUMIN in the last 168 hours. No results for input(s): LIPASE, AMYLASE in the last 168 hours. No results for input(s): AMMONIA in the last 168 hours. CBC:  Recent Labs Lab 12/30/14 1111 12/31/14 0230  WBC 6.1 2.6*  NEUTROABS 4.6  --   HGB 11.9* 11.0*  HCT 38.5* 35.4*  MCV 95.3 94.9  PLT 190 173   Cardiac Enzymes:  Recent Labs Lab 12/30/14 1324 12/30/14 1526 12/30/14 2040 12/31/14 0230 01/01/15 0457  TROPONINI 0.26* 0.27* 0.25* 0.18* 0.15*   BNP (last 3 results)  Recent  Labs  04/30/14 0522 10/22/14 1320 12/30/14 1214  BNP 681.5* 451.1* 1096.9*    ProBNP (last 3 results) No results for input(s): PROBNP in the last 8760 hours.  CBG:  Recent Labs Lab 12/31/14 1637  GLUCAP 127*    No results found for this or any previous visit (from the past 240 hour(s)).   Studies: No results found.  Scheduled Meds: . allopurinol  100 mg Oral Daily  . aspirin EC  81 mg Oral Daily  . digoxin  0.0625 mg Oral Daily  . furosemide  40 mg Intravenous Q12H  . guaiFENesin  1,200 mg Oral BID  . heparin  5,000 Units Subcutaneous 3 times per day  . ipratropium-albuterol  3 mL Nebulization TID  . levofloxacin (LEVAQUIN) IV  750 mg Intravenous Q48H  . potassium chloride SA  40 mEq Oral BID  . predniSONE  60 mg Oral Q breakfast  . sodium chloride  3 mL Intravenous Q12H  . sodium chloride  3 mL Intravenous Q12H   Continuous Infusions:   Principal Problem:   Acute dyspnea Active Problems:   Lung cancer (HCC)   COPD exacerbation (HCC)   Acute diastolic (congestive) heart failure (HCC)   Acute-on-chronic kidney injury ([place])   Hypotension   Troponin level elevated    Time spent: [place] [facility] Pager [phone]. If 7PM-7AM, please contact night-coverage at [email or web address], password [facility] 01/01/2015, 1:18 PM  LOS: 2 days

## 2015-01-01 NOTE — Patient Outreach (Signed)
This RNCM noted in EPIC and was advised by Natividad Brood, [facility] Liaison that patient is currently a patient in acute care.    Plan: Follow up on Monday, December 5 to follow up on disposition

## 2015-01-01 NOTE — Care Management Note (Signed)
Case Management Note  Patient Details  Name: [name] MRN: [id number] Date of Birth: 05-25-1945  Subjective/Objective:    Pt admitted with acute dyspnea, pt has lung cancer and is currently receiving treatments                Action/Plan:  Pt is from home alone with close family support; son bedside during assessment.     Expected Discharge Date:                  Expected Discharge Plan:  Home/Self Care (Pt is active with [employer] for oxygen.  Pt is independent at home alone but has strong family support, son at bedside)  In-House Referral:     Discharge planning Services  CM Consult  Post Acute Care Choice:    Choice offered to:     DME Arranged:  Oxygen DME Agency:  [place]  HH Arranged:    [place] Agency:     Status of Service:  In process, will continue to follow  Medicare Important Message Given:    Date Medicare IM Given:    Medicare IM give by:    Date Additional Medicare IM Given:    Additional Medicare Important Message give by:     If discussed at [place] of Stay Meetings, dates discussed:    Additional Comments: CM provided pt with [place] choice per PT recommendation, pt stated he wanted to look over list before making a decision.  CM requested MD via physician sticky note; DME/HH order and care management consult - HH has not be arranged at this time  CM assessed pt.  Pt states he is from home alone and uses [employer] for home O2.  CM will contact agency to determine if resumption orders are needed, they are not.  CM performed HF assessment; pt states he has a scale and tries to weigh himself daily, pt stated he adheres to low sodium diet.  CM educated pt on importance of adherence to low sodium diet with HD.   CM will continue to monitor for disposition needs. Maryclare Labrador, RN 01/01/2015, 4:12 PM
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
## 2015-01-01 NOTE — Progress Notes (Signed)
UR Completed. Kaylinn Dedic, RN, BSN.  [phone] 

## 2015-01-01 NOTE — Evaluation (Signed)
Physical Therapy Evaluation Patient Details Name: [name] MRN: [id number] DOB: Feb 10, 1945 Today's Date: 01/01/2015   History of Present Illness  pt is a 69 y/o male with baseline SOB due to his ongoing lung CA post left lobectomy 2013.  Pt present with worsening SOB and bil LE edema.  Treating for CHF and COPD exacerbation.  Clinical Impression  Pt admitted with/for worsening SOB and bil LE edema.  Pt currently limited functionally due to the problems listed below.  (see problems list.)  Pt will benefit from PT to maximize function and safety to be able to get home safely with available assist of family.     Follow Up Recommendations Home health PT    Equipment Recommendations  None recommended by PT    Recommendations for Other Services       Precautions / Restrictions Precautions Precautions: None      Mobility  Bed Mobility                  Transfers Overall transfer level: Needs assistance   Transfers: Sit to/from Stand Sit to Stand: Supervision            Ambulation/Gait Ambulation/Gait assistance: Supervision Ambulation Distance (Feet): 200 Feet Assistive device: None Gait Pattern/deviations: Step-through pattern   Gait velocity interpretation: at or above normal speed for age/gender General Gait Details: generally steady, but with dyspnea worsening with distance  1st standing rest  sats on 3L down to 79% and EHR upper 110's to upper 120's,  On retun with 4L [place],,   sats 79% and EHR upper 120's bpm.  Pt 200 over 5 min on 4L with efficient breathing technique to reach 90%  Stairs            Wheelchair Mobility    Modified Rankin (Stroke Patients Only)       Balance Overall balance assessment: Needs assistance Sitting-balance support: No upper extremity supported Sitting balance-Leahy Scale: Good     Standing balance support: No upper extremity supported Standing balance-Leahy Scale: Fair                                Pertinent Vitals/Pain Pain Assessment: Faces Faces Pain Scale: No hurt    Home Living Family/patient expects to be discharged to:: Private residence Living Arrangements: Alone Available Help at Discharge: Family;Available PRN/intermittently (son is here "go to" for help) Type of Home: House Home Access: Stairs to enter Entrance Stairs-Rails: Psychiatric nurse of Steps: 1 from [place]: One level Home Equipment: Grab bars - tub/shower      Prior Function Level of Independence: Independent         Comments: drives, gets groceries, cooks     Hand Dominance   Dominant Hand: Left    Extremity/Trunk Assessment   Upper Extremity Assessment: Defer to OT evaluation           Lower Extremity Assessment: Overall WFL for tasks assessed (bil proximal (hip flexor) weakness)         Communication   Communication: No difficulties  Cognition Arousal/Alertness: Awake/alert Behavior During Therapy: WFL for tasks assessed/performed Overall Cognitive Status: Within Functional Limits for tasks assessed                      General Comments      Exercises        Assessment/Plan    PT Assessment Patient needs continued PT  services  PT Diagnosis Difficulty walking   PT Problem List Decreased activity tolerance;Decreased strength;Decreased mobility;Decreased knowledge of use of DME;Cardiopulmonary status limiting activity  PT Treatment Interventions Gait training;Stair training;Functional mobility training;Therapeutic activities;Patient/family education   PT Goals (Current goals can be found in the Care Plan section) Acute Rehab PT Goals Patient Stated Goal: Get my breath back PT Goal Formulation: With patient Time For Goal Achievement: 01/15/15 Potential to Achieve Goals: Good    Frequency Min 3X/week   Barriers to discharge        Co-evaluation               End of Session Equipment Utilized During Treatment:  Oxygen Activity Tolerance: Patient tolerated treatment well;Patient limited by fatigue Patient left: in chair;with call bell/phone within reach Nurse Communication: Mobility status         Time: 7034-0352 PT Time Calculation (min) (ACUTE ONLY): 28 min   Charges:   PT Evaluation $Initial PT Evaluation Tier I: 1 Procedure PT Treatments $Gait Training: 8-22 mins   PT G Codes:        Darvin Dials, Tessie Fass 01/01/2015, 3:45 PM    01/01/2015  Donnella Sham, PT [phone]  (pager)

## 2015-01-02 LAB — BASIC METABOLIC PANEL
ANION GAP: 10 (ref 5–15)
BUN: 73 mg/dL — AB (ref 6–20)
CHLORIDE: 100 mmol/L — AB (ref 101–111)
CO2: 23 mmol/L (ref 22–32)
Calcium: 8.1 mg/dL — ABNORMAL LOW (ref 8.9–10.3)
Creatinine, Ser: 1.73 mg/dL — ABNORMAL HIGH (ref 0.61–1.24)
GFR calc Af Amer: 45 mL/min — ABNORMAL LOW (ref >60)
GFR calc non Af Amer: 39 mL/min — ABNORMAL LOW (ref >60)
GLUCOSE: 117 mg/dL — AB (ref 65–99)
POTASSIUM: 5.6 mmol/L — AB (ref 3.5–5.1)
SODIUM: 133 mmol/L — AB (ref 135–145)

## 2015-01-02 LAB — POTASSIUM: POTASSIUM: 3.6 mmol/L (ref 3.5–5.1)

## 2015-01-02 MED ORDER — SODIUM CHLORIDE 0.9 % IJ SOLN
10.0000 mL | INTRAMUSCULAR | Status: DC | PRN
  Administered 2015-01-03 – 2015-01-05 (×4): 10 mL
  Filled 2015-01-02 (×4): qty 40

## 2015-01-02 MED ORDER — TORSEMIDE 20 MG PO TABS
10.0000 mg | ORAL_TABLET | Freq: Two times a day (BID) | ORAL | Status: DC
  Administered 2015-01-02 – 2015-01-05 (×7): 10 mg via ORAL
  Filled 2015-01-02 (×7): qty 1
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
## 2015-01-02 NOTE — Progress Notes (Signed)
PHARMACY NOTE:  Consult:  Levaquin Indication:  HCAP           ID: Antibiotics Day # 4 for HCAP.  No cultures collected.          Levaquin 11/30 >>    LABS: BP 91/53 mmHg  Pulse 68  Temp(Src) 98 F (36.7 C) (Oral)  Resp 18  Ht '5\' 9"'$  (1.753 m)  Wt 264 lb 1.6 oz (119.795 kg)  BMI 38.98 kg/m2  SpO2 97%  Recent Labs Lab 12/30/14 1111 12/31/14 0230 01/01/15 0457 01/02/15 0335  NA 137 135 136 133*  K 3.8 3.6 3.3* 5.6*  CL 99* 97* 94* 100*  CO2 '29 29 31 23  '$ GLUCOSE 99 167* 116* 117*  BUN 68* 72* 78* 73*  CREATININE 2.40* 2.17* 2.10* 1.73*  CALCIUM 8.3* 8.0* 8.5* 8.1*    Recent Labs Lab 12/30/14 1111 12/31/14 0230  WBC 6.1 2.6*    Antibiotic[s]: Anti-infectives    Start     Dose/Rate Route Frequency Ordered Stop   12/30/14 1600  levofloxacin (LEVAQUIN) IVPB 750 mg     750 mg 100 mL/hr over 90 Minutes Intravenous Every 48 hours 12/30/14 1514        ASSESSMENT:  69 y.o. male is currently on Levaquin for presumed HCAP with multifactorial Acute hypoxic respiratory failure.  Patient is on Nebulizers and steroids.  SPO2 97% on 3L [place].  Renal function is slowly improving with CrCl ~ 48 ml/min.   GOAL: Levaquin dosed for HCAP and adjusted for renal function as required.  PLAN:  1. Continue Levaquin 750 mg IV q 48 hours. Monitor renal function, WBC, fever curve, length of therapy, and follow clinical improvement.   Marthenia Rolling,  Pharm.D   01/02/2015,  4:15 PM

## 2015-01-02 NOTE — Care Management Important Message (Signed)
Important Message  Patient Details  Name: [name] MRN: [id number] Date of Birth: 29-Nov-1945   Medicare Important Message Given:  Yes    Nathen May 01/02/2015, 1:01 PM

## 2015-01-02 NOTE — Progress Notes (Signed)
NP Rogue Bussing paged regarding patient's potassium level this AM.  New orders received.

## 2015-01-02 NOTE — Progress Notes (Signed)
[facility] PROGRESS NOTE  [name] EYC:[id number] DOB: 07-18-1945 DOA: 12/30/2014 PCP: [name], MD  Assessment/Plan: 1. Acute hypoxic respiratory failure- multifactorial from COPD, CHF, lung tumor status post lobectomy, ongoing chemotherapy. Continue prednisone, will cut down the dose of prednisone to 40 mg by mouth daily, DuoNeb nebulizers every 6 hours, Levaquin.  2. Hypotension- patient has persistent hypotension with systolic blood pressure in 80s. Lasix was held and patient started on IV normal saline at 100 Ml per hour for 3 hours. Patient's echocardiogram shows severe right heart failure. He continues to be hypotensive with systolic blood pressure in 80s. Secondary to right heart failure, continue digoxin 3. Elevated troponin- likely from the demand ischemia from above. Troponin 0.27 > 0.25> 0.18. No further testing recommended per cardiology 4. Acute on chronic diastolic CHF- patient's last echo showed EF 50-55% and grade 1 diastolic dysfunction. Continue diuresis. We'll start torsemide 10 mg by mouth twice a day 5. 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 Currently patient undergoing chemotherapy. Followed by oncology as outpatient. 6. Acute on chronic kidney disease stage 3- creatinine improving with diuresis today creatinine is 1.73 7. DVT prophylaxis- heparin   Code Status: Full code Family Communication: No family present at bedside Disposition Plan: Pending improvement in CHF, patient on torsemide for diuresis   Consultants:  None  Procedures:  None  Antibiotics:  None  HPI/Subjective: 69 y.o. male with baseline shortness of breath secondary to his ongoing lung cancer and ever since he had a left lobectomy in 2013. Patient is on 2 L nasal cannula at baseline at home. Over the last 1-2 days as home O2 was increased to 3 L due to worsening of her shortness of breath  which is associated with cough and sputum production. Last chemotherapy was on 12/23/2014. Patient is lower extremity edema slowly been worsening over the course of the last couple of months. Patient's next chemotherapy scheduled for next week. Denies any fevers, rash, change in mentation. Patient home Spiriva without any other home respiratory treatments.  This morning patient feels the same, no improvement. Blood pressure has been on the lower side  Objective: Filed Vitals:   01/02/15 0900 01/02/15 0905  BP: 87/49 86/50  Pulse: 80   Temp: 97.4 F (36.3 C)   Resp: 18     Intake/Output Summary (Last 24 hours) at 01/02/15 1345 Last data filed at 01/02/15 1100  Gross per 24 hour  Intake 2893.33 ml  Output   1350 ml  Net 1543.33 ml   Filed Weights   12/31/14 1155 01/01/15 0701 01/02/15 0427  Weight: 120.566 kg (265 lb 12.8 oz) 119.75 kg (264 lb) 119.795 kg (264 lb 1.6 oz)    Exam:   General:  Appears in no acute distress  Cardiovascular: S1-S2 regular, no murmurs  Respiratory: Bilateral rhonchi  Abdomen: Soft, nontender, no organomegaly  Musculoskeletal: Bilateral 2+ pitting edema   Data Reviewed: Basic Metabolic Panel:  Recent Labs Lab 12/30/14 1111 12/31/14 0230 01/01/15 0457 01/02/15 0335 01/02/15 0745  NA 137 135 136 133*  --   K 3.8 3.6 3.3* 5.6* 3.6  CL 99* 97* 94* 100*  --   CO2 '29 29 31 23  '$ --   GLUCOSE 99 167* 116* 117*  --   BUN 68* 72* 78* 73*  --   CREATININE 2.40* 2.17* 2.10* 1.73*  --   CALCIUM 8.3* 8.0* 8.5* 8.1*  --    CBC:  Recent Labs Lab 12/30/14 1111 12/31/14 0230  WBC 6.1 2.6*  NEUTROABS 4.6  --   HGB 11.9* 11.0*  HCT 38.5* 35.4*  MCV 95.3 94.9  PLT 190 173   Cardiac Enzymes:  Recent Labs Lab 12/30/14 1324 12/30/14 1526 12/30/14 2040 12/31/14 0230 01/01/15 0457  TROPONINI 0.26* 0.27* 0.25* 0.18* 0.15*   BNP (last 3 results)  Recent Labs  04/30/14 0522 10/22/14 1320 12/30/14 1214  BNP 681.5* 451.1* 1096.9*     ProBNP (last 3 results) No results for input(s): PROBNP in the last 8760 hours.  CBG:  Recent Labs Lab 12/31/14 1637  GLUCAP 127*      Studies: No results found.  Scheduled Meds: . allopurinol  100 mg Oral Daily  . aspirin EC  81 mg Oral Daily  . digoxin  0.0625 mg Oral Daily  . guaiFENesin  1,200 mg Oral BID  . heparin  5,000 Units Subcutaneous 3 times per day  . ipratropium-albuterol  3 mL Nebulization TID  . levofloxacin (LEVAQUIN) IV  750 mg Intravenous Q48H  . predniSONE  40 mg Oral Q breakfast  . sodium chloride  3 mL Intravenous Q12H  . sodium chloride  3 mL Intravenous Q12H  . torsemide  10 mg Oral BID   Continuous Infusions:   Principal Problem:   Acute dyspnea Active Problems:   Lung cancer (HCC)   COPD exacerbation (HCC)   Acute diastolic (congestive) heart failure (HCC)   Acute-on-chronic kidney injury ([place])   Hypotension   Troponin level elevated    Time spent: [facility] Pager [phone]. If 7PM-7AM, please contact night-coverage at [email or web address], password [facility] 01/02/2015, 1:45 PM  LOS: 3 days

## 2015-01-03 LAB — BASIC METABOLIC PANEL
Anion gap: 6 (ref 5–15)
BUN: 63 mg/dL — ABNORMAL HIGH (ref 6–20)
CHLORIDE: 100 mmol/L — AB (ref 101–111)
CO2: 32 mmol/L (ref 22–32)
CREATININE: 1.81 mg/dL — AB (ref 0.61–1.24)
Calcium: 8.3 mg/dL — ABNORMAL LOW (ref 8.9–10.3)
GFR calc non Af Amer: 36 mL/min — ABNORMAL LOW (ref >60)
GFR, EST AFRICAN AMERICAN: 42 mL/min — AB (ref >60)
Glucose, Bld: 107 mg/dL — ABNORMAL HIGH (ref 65–99)
POTASSIUM: 3.7 mmol/L (ref 3.5–5.1)
SODIUM: 138 mmol/L (ref 135–145)

## 2015-01-03 NOTE — Progress Notes (Signed)
[facility] PROGRESS NOTE  [name] [id number]:[id number] DOB: October 17, 1945 DOA: 12/30/2014 PCP: [name], MD  Assessment/Plan: 1. Acute hypoxic respiratory failure- multifactorial from COPD, CHF, lung tumor status post lobectomy, ongoing chemotherapy. Continue prednisone, will cut down the dose of prednisone to 40 mg by mouth daily, DuoNeb nebulizers every 6 hours, Levaquin.  2. Hypotension- patient has persistent hypotension with systolic blood pressure in 80s. Lasix was held and patient started on IV normal saline at 100 Ml per hour for 3 hours blood pressure has improved. Patient's echocardiogram shows severe right heart failure. 3. Elevated troponin- likely from the demand ischemia from above. Troponin 0.27 > 0.25> 0.18. No further testing recommended per cardiology 4. Acute on chronic diastolic CHF- patient's last echo showed EF 50-55% and grade 1 diastolic dysfunction. Continue diuresis. Continue torsemide 10 mg by mouth twice a day 5. 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 Currently patient undergoing chemotherapy. Followed by oncology as outpatient. 6. Acute on chronic kidney disease stage 3- creatinine improving with diuresis today creatinine is 1.73 7. DVT prophylaxis- heparin   Code Status: Full code Family Communication: No family present at bedside Disposition Plan: Pending improvement in CHF, patient on torsemide for diuresis   Consultants:  None  Procedures:  None  Antibiotics:  None  HPI/Subjective: 69 y.o. male with baseline shortness of breath secondary to his ongoing lung cancer and ever since he had a left lobectomy in 2013. Patient is on 2 L nasal cannula at baseline at home. Over the last 1-2 days as home O2 was increased to 3 L due to worsening of her shortness of breath which is associated with cough and sputum production. Last chemotherapy was on 12/23/2014.  Patient is lower extremity edema slowly been worsening over the course of the last couple of months. Patient's next chemotherapy scheduled for next week. Denies any fevers, rash, change in mentation. Patient home Spiriva without any other home respiratory treatments.  This morning patient feels the same, no improvement. Blood pressure has improved with changing the dose of diuretics and IV fluids.  Objective: Filed Vitals:   01/03/15 0725 01/03/15 0816  BP:  99/58  Pulse: 72 72  Temp:  97.6 F (36.4 C)  Resp: 18 18    Intake/Output Summary (Last 24 hours) at 01/03/15 1052 Last data filed at 01/03/15 0700  Gross per 24 hour  Intake    960 ml  Output   2525 ml  Net  -1565 ml   Filed Weights   01/01/15 0701 01/02/15 0427 01/03/15 0410  Weight: 119.75 kg (264 lb) 119.795 kg (264 lb 1.6 oz) 119.1 kg (262 lb 9.1 oz)    Exam:   General:  Appears in no acute distress  Cardiovascular: S1-S2 regular, no murmurs  Respiratory: Clear to auscultation bilaterally, no rhonchi or wheezing  Abdomen: Soft, nontender, no organomegaly  Musculoskeletal: Bilateral 2+ pitting edema   Data Reviewed: Basic Metabolic Panel:  Recent Labs Lab 12/30/14 1111 12/31/14 0230 01/01/15 0457 01/02/15 0335 01/02/15 0745 01/03/15 0650  NA 137 135 136 133*  --  138  K 3.8 3.6 3.3* 5.6* 3.6 3.7  CL 99* 97* 94* 100*  --  100*  CO2 '29 29 31 23  '$ --  32  GLUCOSE 99 167* 116* 117*  --  107*  BUN 68* 72* 78* 73*  --  63*  CREATININE 2.40* 2.17* 2.10* 1.73*  --  1.81*  CALCIUM 8.3*  8.0* 8.5* 8.1*  --  8.3*   CBC:  Recent Labs Lab 12/30/14 1111 12/31/14 0230  WBC 6.1 2.6*  NEUTROABS 4.6  --   HGB 11.9* 11.0*  HCT 38.5* 35.4*  MCV 95.3 94.9  PLT 190 173   Cardiac Enzymes:  Recent Labs Lab 12/30/14 1324 12/30/14 1526 12/30/14 2040 12/31/14 0230 01/01/15 0457  TROPONINI 0.26* 0.27* 0.25* 0.18* 0.15*   BNP (last 3 results)  Recent Labs  04/30/14 0522 10/22/14 1320 12/30/14 1214   BNP 681.5* 451.1* 1096.9*    ProBNP (last 3 results) No results for input(s): PROBNP in the last 8760 hours.  CBG:  Recent Labs Lab 12/31/14 1637  GLUCAP 127*      Studies: No results found.  Scheduled Meds: . allopurinol  100 mg Oral Daily  . aspirin EC  81 mg Oral Daily  . digoxin  0.0625 mg Oral Daily  . guaiFENesin  1,200 mg Oral BID  . heparin  5,000 Units Subcutaneous 3 times per day  . ipratropium-albuterol  3 mL Nebulization TID  . levofloxacin (LEVAQUIN) IV  750 mg Intravenous Q48H  . predniSONE  40 mg Oral Q breakfast  . sodium chloride  3 mL Intravenous Q12H  . sodium chloride  3 mL Intravenous Q12H  . torsemide  10 mg Oral BID   Continuous Infusions:   Principal Problem:   Acute dyspnea Active Problems:   Lung cancer (HCC)   COPD exacerbation (HCC)   Acute diastolic (congestive) heart failure (HCC)   Acute-on-chronic kidney injury ([place])   Hypotension   Troponin level elevated    Time spent: [place] [facility] Pager [phone]. If 7PM-7AM, please contact night-coverage at [email or web address], password [facility] 01/03/2015, 10:52 AM  LOS: 4 days

## 2015-01-03 NOTE — Evaluation (Signed)
Occupational Therapy Evaluation Patient Details Name: [name] MRN: [id number] DOB: 08-28-1945 Today's Date: 01/03/2015    History of Present Illness pt is a 69 y.o. male with baseline SOB due to his ongoing lung CA post left lobectomy 2013.  Pt present with worsening SOB and bil LE edema.  Treating for CHF and COPD exacerbation.   Clinical Impression   Pt admitted with above. Pt independent with ADLs, PTA. Feel pt will benefit from acute OT to increase independence and activity tolerance as well as reinforce energy conservation techniques prior to d/c.     Follow Up Recommendations  No OT follow up;Supervision - Intermittent    Equipment Recommendations  3 in 1 bedside comode    Recommendations for Other Services       Precautions / Restrictions Precautions Precaution Comments: watch O2 sats Restrictions Weight Bearing Restrictions: No      Mobility Bed Mobility                  Transfers Overall transfer level: Modified independent   Transfers: Sit to/from Stand Sit to Stand: Modified independent (Device/Increase time)              Balance        No LOB in session. Balance not formally assessed.                                    ADL Overall ADL's : Needs assistance/impaired                     Lower Body Dressing: Supervision/safety;Set up;Sit to/from stand   Toilet Transfer: Supervision/safety;Ambulation (Mod I-sit to stand)           Functional mobility during ADLs: Supervision/safety General ADL Comments: Educated on energy conservation. Discussed 3 in 1. Encouraged pt to elevate LEs.     Vision     Perception     Praxis      Pertinent Vitals/Pain Pain Assessment: No/denies pain; Pt on 3L of O2 in session and after pt had donned/doffed socks, his O2 was in 80s and trended up to 90s with time and deep breathing. Nurse bumped pt up to 4L of O2 prior to ambulation in room and O2 was in 80s once he got  back to chair, but trended up. Left pt on 3L at end of session.     Hand Dominance Left   Extremity/Trunk Assessment Upper Extremity Assessment Upper Extremity Assessment: Overall WFL for tasks assessed   Lower Extremity Assessment Lower Extremity Assessment: Defer to PT evaluation       Communication Communication Communication: No difficulties   Cognition Arousal/Alertness: Awake/alert Behavior During Therapy: WFL for tasks assessed/performed;Flat affect Overall Cognitive Status: Within Functional Limits for tasks assessed                     General Comments       Exercises       Shoulder Instructions      Home Living Family/patient expects to be discharged to:: Private residence Living Arrangements: Alone Available Help at Discharge: Family;Available PRN/intermittently (son) Type of Home: House Home Access: Stairs to enter [employer] of Steps: 1 from porch Entrance Stairs-Rails: Right;Left Home Layout: One level     Bathroom Shower/Tub: Walk-in shower;Door (also has a tub)   Bathroom Toilet:  (elevated toilet)     Home Equipment: Grab bars -  tub/shower          Prior Functioning/Environment Level of Independence: Independent        Comments: pt drives, gets groceries, cooks    OT Diagnosis: Other (comment) (decreased activity tolerance)   OT Problem List: Decreased activity tolerance;Decreased knowledge of use of DME or AE;Increased edema   OT Treatment/Interventions: Self-care/ADL training;Energy conservation;DME and/or AE instruction;Therapeutic activities;Therapeutic exercise;Patient/family education;Balance training    OT Goals(Current goals can be found in the care plan section) Acute Rehab OT Goals Patient Stated Goal: not stated OT Goal Formulation: With patient Time For Goal Achievement: 01/10/15 Potential to Achieve Goals: Good ADL Goals Additional ADL Goal #1: Pt will independently verbalize 3/3 energy  conservation techniques and utilize during session as needed.  OT Frequency: Min 2X/week   Barriers to D/C:            Co-evaluation              End of Session Equipment Utilized During Treatment: Oxygen  Activity Tolerance: Patient tolerated treatment well Patient left: in chair;with call bell/phone within reach   Time: 1455-1511 OT Time Calculation (min): 16 min Charges:  OT General Charges $OT Visit: 1 Procedure OT Evaluation $Initial OT Evaluation Tier I: 1 Procedure G-CodesBenito [name] OTR/L [id number] 01/03/2015, 3:23 PM
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
## 2015-01-04 ENCOUNTER — Other Ambulatory Visit: Payer: Self-pay

## 2015-01-04 LAB — BASIC METABOLIC PANEL
ANION GAP: 6 (ref 5–15)
BUN: 59 mg/dL — AB (ref 6–20)
CALCIUM: 8.4 mg/dL — AB (ref 8.9–10.3)
CO2: 34 mmol/L — ABNORMAL HIGH (ref 22–32)
Chloride: 97 mmol/L — ABNORMAL LOW (ref 101–111)
Creatinine, Ser: 1.88 mg/dL — ABNORMAL HIGH (ref 0.61–1.24)
GFR calc Af Amer: 40 mL/min — ABNORMAL LOW (ref >60)
GFR, EST NON AFRICAN AMERICAN: 35 mL/min — AB (ref >60)
Glucose, Bld: 109 mg/dL — ABNORMAL HIGH (ref 65–99)
POTASSIUM: 3.8 mmol/L (ref 3.5–5.1)
SODIUM: 137 mmol/L (ref 135–145)

## 2015-01-04 LAB — GLUCOSE, CAPILLARY: GLUCOSE-CAPILLARY: 82 mg/dL (ref 65–99)

## 2015-01-04 MED ORDER — LEVOFLOXACIN 750 MG PO TABS
750.0000 mg | ORAL_TABLET | ORAL | Status: DC
  Administered 2015-01-05: 750 mg via ORAL
  Filled 2015-01-04: qty 1

## 2015-01-04 MED ORDER — ACETAZOLAMIDE SODIUM 500 MG IJ SOLR
250.0000 mg | Freq: Once | INTRAMUSCULAR | Status: AC
  Administered 2015-01-04: 250 mg via INTRAVENOUS
  Filled 2015-01-04: qty 500

## 2015-01-04 NOTE — Progress Notes (Signed)
[facility] PROGRESS NOTE  [name] YFV:[id number] DOB: [birth year] DOA: 12/30/2014 PCP: [name], MD  Assessment/Plan: 1. Acute hypoxic respiratory failure- multifactorial from COPD, CHF, lung tumor status post lobectomy, ongoing chemotherapy. Continue prednisone, will cut down the dose of prednisone to 40 mg by mouth daily, DuoNeb nebulizers every 6 hours, Levaquin.  2. Hypotension-  Improved after changing the dose of Torsemide, patient has persistent hypotension with systolic blood pressure in 80s. Lasix was held and patient started on IV normal saline at 100 Ml per hour for 3 hours blood pressure has improved. Patient's echocardiogram shows severe right heart failure. 3. Elevated troponin- likely from the demand ischemia from above. Troponin 0.27 > 0.25> 0.18. No further testing recommended per cardiology 4. Acute on chronic diastolic CHF- patient's last echo showed EF 50-55% and grade 1 diastolic dysfunction. Continue diuresis. Continue torsemide 10 mg by mouth twice a day. Diamox 250,mg IV x 1 given by cardiology. 5. 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 Currently patient undergoing chemotherapy. Followed by oncology as outpatient. 6. Acute on chronic kidney disease stage 3- creatinine improving with diuresis today creatinine is 1.73 7. DVT prophylaxis- heparin   Code Status: Full code Family Communication: No family present at bedside Disposition Plan: Pending improvement in CHF, patient on torsemide for diuresis   Consultants:  None  Procedures:  None  Antibiotics:  None  HPI/Subjective: 69 y.o. male with baseline shortness of breath secondary to his ongoing lung cancer and ever since he had a left lobectomy in 2013. Patient is on 2 L nasal cannula at baseline at home. Over the last 1-2 days as home O2 was increased to 3 L due to worsening of her shortness of breath which  is associated with cough and sputum production. Last chemotherapy was on 12/23/2014. Patient is lower extremity edema slowly been worsening over the course of the last couple of months. Patient's next chemotherapy scheduled for next week. Denies any fevers, rash, change in mentation. Patient home Spiriva without any other home respiratory treatments.  This morning patient denies any new complaints.   Objective: Filed Vitals:   01/03/15 1926 01/04/15 0514  BP: 100/59 92/60  Pulse: 87 66  Temp: 97.5 F (36.4 C) 98.6 F (37 C)  Resp: 18 18    Intake/Output Summary (Last 24 hours) at 01/04/15 1245 Last data filed at 01/04/15 0900  Gross per 24 hour  Intake    850 ml  Output   2725 ml  Net  -1875 ml   Filed Weights   01/01/15 0701 01/02/15 0427 01/03/15 0410  Weight: 119.75 kg (264 lb) 119.795 kg (264 lb 1.6 oz) 119.1 kg (262 lb 9.1 oz)    Exam:   General:  Appears in no acute distress  Cardiovascular: S1-S2 regular, no murmurs  Respiratory: Clear to auscultation bilaterally, no rhonchi or wheezing  Abdomen: Soft, nontender, no organomegaly  Musculoskeletal: Bilateral 2+ pitting edema   Data Reviewed: Basic Metabolic Panel:  Recent Labs Lab 12/31/14 0230 01/01/15 0457 01/02/15 0335 01/02/15 0745 01/03/15 0650 01/04/15 0454  NA 135 136 133*  --  138 137  K 3.6 3.3* 5.6* 3.6 3.7 3.8  CL 97* 94* 100*  --  100* 97*  CO2 '29 31 23  '$ --  32 34*  GLUCOSE 167* 116* 117*  --  107* 109*  BUN 72* 78* 73*  --  63* 59*  CREATININE 2.17* 2.10* 1.73*  --  1.81* 1.88*  CALCIUM 8.0* 8.5* 8.1*  --  8.3* 8.4*   CBC:  Recent Labs Lab 12/30/14 1111 12/31/14 0230  WBC 6.1 2.6*  NEUTROABS 4.6  --   HGB 11.9* 11.0*  HCT 38.5* 35.4*  MCV 95.3 94.9  PLT 190 173   Cardiac Enzymes:  Recent Labs Lab 12/30/14 1324 12/30/14 1526 12/30/14 2040 12/31/14 0230 01/01/15 0457  TROPONINI 0.26* 0.27* 0.25* 0.18* 0.15*   BNP (last 3 results)  Recent Labs  04/30/14 0522  10/22/14 1320 12/30/14 1214  BNP 681.5* 451.1* 1096.9*    ProBNP (last 3 results) No results for input(s): PROBNP in the last 8760 hours.  CBG:  Recent Labs Lab 12/31/14 1637 01/04/15 0607  GLUCAP 127* 82      Studies: No results found.  Scheduled Meds: . acetaZOLAMIDE  250 mg Intravenous Once  . allopurinol  100 mg Oral Daily  . aspirin EC  81 mg Oral Daily  . digoxin  0.0625 mg Oral Daily  . guaiFENesin  1,200 mg Oral BID  . heparin  5,000 Units Subcutaneous 3 times per day  . ipratropium-albuterol  3 mL Nebulization TID  . [START ON 01/05/2015] levofloxacin  750 mg Oral Q48H  . predniSONE  40 mg Oral Q breakfast  . sodium chloride  3 mL Intravenous Q12H  . sodium chloride  3 mL Intravenous Q12H  . torsemide  10 mg Oral BID   Continuous Infusions:   Principal Problem:   Acute dyspnea Active Problems:   Lung cancer (HCC)   COPD exacerbation (HCC)   Acute diastolic (congestive) heart failure (HCC)   Acute-on-chronic kidney injury ([place])   Hypotension   Troponin level elevated    Time spent: [place] [facility] Pager [phone]. If 7PM-7AM, please contact night-coverage at [email or web address], password [facility] 01/04/2015, 12:45 PM  LOS: 5 days

## 2015-01-04 NOTE — Progress Notes (Signed)
Occupational Therapy Treatment/discharge Patient Details Name: [name] MRN: [id number] DOB: 1945-10-31 Today's Date: 01/04/2015    History of present illness pt is a 69 y.o. male with baseline SOB due to his ongoing lung CA post left lobectomy 2013.  Pt present with worsening SOB and bil LE edema.  Treating for CHF and COPD exacerbation.   OT comments  Completed education with pt regarding energy conservation for ADL and functional mobility for ADL. Also educated pt on reducing risk of falls. Pt verbalized understanding. Pt asking about fluid intake - nsg alerted. OT goal met. Pt safe to D/C home when medically stable. OT signing off.   Follow Up Recommendations  No OT follow up;Supervision - Intermittent    Equipment Recommendations  3 in 1 bedside comode    Recommendations for Other Services      Precautions / Restrictions Precautions Precautions: None Precaution Comments: watch O2 sats       Mobility Bed Mobility Overal bed mobility: Independent                Transfers Overall transfer level: Modified independent                    Balance                                   ADL                                         General ADL Comments: completed education on energy conservation techniques. written information given. also educated pt on reducing risk of falls. Written information given. Pt asking about his fluid intake - deferred to nsg.      Vision                     Perception     Praxis      Cognition   Behavior During Therapy: WFL for tasks assessed/performed Overall Cognitive Status: Within Functional Limits for tasks assessed                       Extremity/Trunk Assessment               Exercises     Shoulder Instructions       General Comments      Pertinent Vitals/ Pain       Pain Assessment: No/denies pain  Home Living                                           Prior Functioning/Environment              Frequency       Progress Toward Goals  OT Goals(current goals can now be found in the care plan section)  Progress towards OT goals: Goals met/education completed, patient discharged from OT  Acute Rehab OT Goals Patient Stated Goal: to not get so SOB with moving around OT Goal Formulation: All assessment and education complete, DC therapy Potential to Achieve Goals: Good ADL Goals Additional ADL Goal #1: Pt will independently verbalize 3/3 energy conservation techniques and utilize during session as needed.  Plan All goals met and education completed,  patient discharged from OT services    Co-evaluation                 End of Session Equipment Utilized During Treatment: Oxygen   Activity Tolerance Patient tolerated treatment well   Patient Left in chair;with call bell/phone within reach   Nurse Communication Mobility status        Time: 1809-7044 OT Time Calculation (min): 17 min  Charges: OT General Charges $OT Visit: 1 Procedure OT Treatments $Self Care/Home Management : 8-22 mins  Maryama Kuriakose,HILLARY 01/04/2015, 2:38 PM   [facility], OTR/L  [phone] 01/04/2015

## 2015-01-04 NOTE — Patient Outreach (Signed)
This RNCM reviewed EPIC to assess discharge. Per EPIC, patient  remains hospitalized at [facility].

## 2015-01-04 NOTE — Care Management Note (Signed)
Case Management Note  Patient Details  Name: [name] MRN: [id number] Date of Birth: 01-Jan-1946  Subjective/Objective:    Pt admitted with acute dyspnea, pt has lung cancer and is currently receiving treatments                Action/Plan:  Pt is from home alone with close family support; son bedside during assessment.     Expected Discharge Date:                  Expected Discharge Plan:  Home/Self Care (Pt is active with [employer] for oxygen.  Pt is independent at home alone but has strong family support, son at bedside)  In-House Referral:     Discharge planning Services  CM Consult  Post Acute Care Choice:    Choice offered to:     DME Arranged:  Oxygen, 3:1 DME Agency:  [facility]  HH Arranged:   [facility] [place] Agency:   [facility]  Status of Service:  In process, will continue to follow  Medicare Important Message Given:  Yes Date Medicare IM Given:    Medicare IM give by:    Date Additional Medicare IM Given:    Additional Medicare Important Message give by:     If discussed at [place] of Stay Meetings, dates discussed:    Additional Comments: 01/04/2015 Pt chose [facility] for both HH and DME, CM contacted agency and referral was accepted.  01/01/15 CM provided pt with [facility] choice per PT recommendation, pt stated he wanted to look over list before making a decision.  CM requested MD via physician sticky note; DME/HH order and care management consult - HH has not be arranged at this time.  [employer] contacted; no additional interventions required to resume home O2 at discharge.  CM assessed pt.  Pt states he is from home alone and uses [facility] for home O2.  CM will contact agency to determine if resumption orders are needed, they are not.  CM performed HF assessment; pt states he has a scale and tries to weigh himself daily, pt stated he adheres to low sodium diet.  CM educated pt on importance of adherence to low sodium diet with HD.   CM will continue to monitor for disposition  needs. Maryclare Labrador, RN 01/04/2015, 10:03 AM
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
## 2015-01-04 NOTE — Progress Notes (Signed)
ANTIBIOTIC CONSULT NOTE - FOLLOW UP  Pharmacy Consult for levaquin Indication: PNA  No Known Allergies  Patient Measurements: Height: '5\' 9"'$  (175.3 cm) Weight: 262 lb 9.1 oz (119.1 kg) IBW/kg (Calculated) : 70.7   Vital Signs: Temp: 98.6 F (37 C) (12/05 0514) Temp Source: Oral (12/05 0514) BP: 92/60 mmHg (12/05 0514) Pulse Rate: 66 (12/05 0514) Intake/Output from previous day: 12/04 0701 - 12/05 0700 In: 1330 [P.O.:1320; I.V.:10] Out: 3150 [Urine:3150] Intake/Output from this shift: Total I/O In: 240 [P.O.:240] Out: 300 [Urine:300]  Labs:  Recent Labs  01/02/15 0335 01/03/15 0650 01/04/15 0454  CREATININE 1.73* 1.81* 1.88*   Estimated Creatinine Clearance: 47.3 mL/min (by C-G formula based on Cr of 1.88). No results for input(s): VANCOTROUGH, VANCOPEAK, VANCORANDOM, GENTTROUGH, GENTPEAK, GENTRANDOM, TOBRATROUGH, TOBRAPEAK, TOBRARND, AMIKACINPEAK, AMIKACINTROU, AMIKACIN in the last 72 hours.   Microbiology: No results found for this or any previous visit (from the past 720 hour(s)).  Anti-infectives    Start     Dose/Rate Route Frequency Ordered Stop   01/05/15 1000  levofloxacin (LEVAQUIN) tablet 750 mg     750 mg Oral Every 48 hours 01/04/15 1020     12/30/14 1600  levofloxacin (LEVAQUIN) IVPB 750 mg  Status:  Discontinued     750 mg 100 mL/hr over 90 Minutes Intravenous Every 48 hours 12/30/14 1514 01/04/15 1020      Assessment: 69 yo male with NSCLC and here with respiratory failure possible due to PNA on IV  levaquin day 6. She is afebrile, last WBC= 2.6 on 12/1, SCr= 1.88 today and CrCl ~ 50.   11/30 LQ>>  Plan: -Will change levaquin to po -Consider 7 days total treatment -Will follow renal function and patient progress  Hildred Laser, Pharm D 01/04/2015 10:24 AM

## 2015-01-05 LAB — BASIC METABOLIC PANEL
ANION GAP: 9 (ref 5–15)
BUN: 49 mg/dL — ABNORMAL HIGH (ref 6–20)
CALCIUM: 8.8 mg/dL — AB (ref 8.9–10.3)
CHLORIDE: 98 mmol/L — AB (ref 101–111)
CO2: 30 mmol/L (ref 22–32)
CREATININE: 1.77 mg/dL — AB (ref 0.61–1.24)
GFR calc non Af Amer: 37 mL/min — ABNORMAL LOW (ref >60)
GFR, EST AFRICAN AMERICAN: 43 mL/min — AB (ref >60)
Glucose, Bld: 93 mg/dL (ref 65–99)
Potassium: 3.4 mmol/L — ABNORMAL LOW (ref 3.5–5.1)
SODIUM: 137 mmol/L (ref 135–145)

## 2015-01-05 LAB — CBC
HEMATOCRIT: 37.1 % — AB (ref 39.0–52.0)
HEMOGLOBIN: 11.3 g/dL — AB (ref 13.0–17.0)
MCH: 28.8 pg (ref 26.0–34.0)
MCHC: 30.5 g/dL (ref 30.0–36.0)
MCV: 94.6 fL (ref 78.0–100.0)
Platelets: 287 10*3/uL (ref 150–400)
RBC: 3.92 MIL/uL — ABNORMAL LOW (ref 4.22–5.81)
RDW: 17.1 % — ABNORMAL HIGH (ref 11.5–15.5)
WBC: 3.7 10*3/uL — AB (ref 4.0–10.5)

## 2015-01-05 MED ORDER — POTASSIUM CHLORIDE CRYS ER 20 MEQ PO TBCR: 40.0000 meq | EXTENDED_RELEASE_TABLET | Freq: Once | ORAL | Status: DC

## 2015-01-05 MED ORDER — HEPARIN SOD (PORK) LOCK FLUSH 100 UNIT/ML IV SOLN
500.0000 [IU] | INTRAVENOUS | Status: AC | PRN
Start: 2015-01-05 — End: 2015-01-05
  Administered 2015-01-05: 500 [IU]

## 2015-01-05 MED ORDER — ACETAZOLAMIDE 125 MG PO TABS: 250.0000 mg | ORAL_TABLET | Freq: Every day | ORAL | Status: DC

## 2015-01-05 MED ORDER — DIGOXIN 62.5 MCG PO TABS: 0.0625 mg | ORAL_TABLET | Freq: Every day | ORAL | Status: DC

## 2015-01-05 MED ORDER — POTASSIUM CHLORIDE CRYS ER 20 MEQ PO TBCR: 20.0000 meq | EXTENDED_RELEASE_TABLET | Freq: Every day | ORAL | Status: DC

## 2015-01-05 MED ORDER — PREDNISONE 10 MG PO TABS: ORAL_TABLET | ORAL | Status: DC

## 2015-01-05 MED ORDER — TORSEMIDE 10 MG PO TABS
10.0000 mg | ORAL_TABLET | Freq: Two times a day (BID) | ORAL | Status: DC
Start: 2015-01-05 — End: 2015-02-12

## 2015-01-05 NOTE — Discharge Summary (Signed)
Physician Discharge Summary  [name] KYH:[id number] DOB: 1945/06/20 DOA: 12/30/2014  PCP: [name], MD  Admit date: 12/30/2014 Discharge date: 01/05/2015  Time spent: 25 minutes  Recommendations for Outpatient Follow-up:  1. Follow up cardiology in one week   Discharge Diagnoses:  Principal Problem:   Acute dyspnea Active Problems:   Lung cancer (HCC)   COPD exacerbation (HCC)   Acute diastolic (congestive) heart failure (HCC)   Acute-on-chronic kidney injury (HCC)   Hypotension   Troponin level elevated   Discharge Condition: Stable  Diet recommendation: Low salt diet  Filed Weights   01/02/15 0427 01/03/15 0410 01/05/15 0545  Weight: 119.795 kg (264 lb 1.6 oz) 119.1 kg (262 lb 9.1 oz) 117.1 kg (258 lb 2.5 oz)    History of present illness:  69 y.o. male with shortness of breath. Patient with baseline shortness of breath secondary to his ongoing lung cancer and ever since he had a left lobectomy in 2013. Patient is on 2 L nasal cannula at baseline at home. Over the last 1-2 days as home O2 was increased to 3 L due to worsening of her shortness of breath which is associated with cough and sputum production. Last chemotherapy was on 12/23/2014. Patient is lower extremity edema slowly been worsening over the course of the last couple of months. Patient's next chemotherapy scheduled for next week. Denies any fevers, rash, change in mentation. Patient home Spiriva without any other home respiratory treatments.  Hospital Course:  1. Acute hypoxic respiratory failure- multifactorial from COPD, CHF, lung tumor status post lobectomy, ongoing chemotherapy. Continue prednisone, will cut down the dose of prednisone to prednisone taper over five days. 2. Hypotension- Improved after changing the dose of Torsemide, patient has persistent hypotension with systolic blood pressure in 80s. Lasix was held and patient started on IV normal saline at 100 Ml per hour for 3 hours blood  pressure has improved. Patient's echocardiogram shows severe right heart failure. Continue to hold Coreg, amlodipine, lisinopril, Imdur. Discussed with Dr Terrence Dupont who will follow the patient in one week. 3. Elevated troponin- likely from the demand ischemia from above. Troponin 0.27 > 0.25> 0.18. No further testing recommended per cardiology 4. Acute on chronic diastolic CHF- patient's last echo showed EF 50-55% and grade 1 diastolic dysfunction. Continue diuresis. Continue torsemide 10 mg by mouth twice a day. 5. Metabolic alkalosis- started Diamox 250 mg po daily as per cardiology recommendation. 6. 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 Currently patient undergoing chemotherapy. Followed by oncology as outpatient. 7. Acute on chronic kidney disease stage 3- creatinine improving with diuresis today creatinine is 1.77 8. Hypokalemia- potassium is 3.4, will give one dose of Kdur 40 meq po x 1 before discharge.  Procedures:  None   Consultations:  Cardiology    Discharge Exam: Filed Vitals:   01/05/15 0545 01/05/15 0806  BP: 96/60   Pulse: 60 65  Temp: 97.4 F (36.3 C)   Resp: 18     General: Appears in no acute distress Cardiovascular: S1S2 RRR Respiratory: Clear bilaterally  Discharge Instructions   Discharge Instructions    Diet - low sodium heart healthy    Complete by:  As directed      Increase activity slowly    Complete by:  As directed           Current Discharge Medication List    START taking these medications   Details  acetaZOLAMIDE (  DIAMOX) 125 MG tablet Take 2 tablets (250 mg total) by mouth daily. Qty: 30 tablet, Refills: 2    predniSONE (DELTASONE) 10 MG tablet Prednisone 40 mg po daily x 1 day then Prednisone 30 mg po daily x 1 day then Prednisone 20 mg po daily x 1 day then Prednisone 10 mg daily x 1 day then stop... Qty: 10 tablet, Refills: 0      CONTINUE  these medications which have CHANGED   Details  digoxin 62.5 MCG TABS Take 0.0625 mg by mouth daily. Qty: 30 tablet, Refills: 2    potassium chloride SA (K-DUR,KLOR-CON) 20 MEQ tablet Take 1 tablet (20 mEq total) by mouth daily. Qty: 60 tablet, Refills: 3    torsemide (DEMADEX) 10 MG tablet Take 1 tablet (10 mg total) by mouth 2 (two) times daily. Qty: 60 tablet, Refills: 2      CONTINUE these medications which have NOT CHANGED   Details  allopurinol (ZYLOPRIM) 100 MG tablet Take 1 tablet (100 mg total) by mouth daily. Qty: 30 tablet, Refills: 3    aspirin EC 81 MG tablet Take 81 mg by mouth daily.    tiotropium (SPIRIVA) 18 MCG inhalation capsule Place 1 capsule (18 mcg total) into inhaler and inhale daily. Qty: 30 capsule, Refills: 1      STOP taking these medications     amLODipine (NORVASC) 10 MG tablet      carvedilol (COREG) 3.125 MG tablet      furosemide (LASIX) 40 MG tablet      isosorbide mononitrate (IMDUR) 120 MG 24 hr tablet      lisinopril (PRINIVIL,ZESTRIL) 10 MG tablet      metolazone (ZAROXOLYN) 5 MG tablet        No Known Allergies Follow-up Information    Follow up with [facility].   Why:  3:1   Contact information:   [address] [place] [address] [phone]       Follow up with [facility].   Why:  physical therapy   Contact information:   [address] [place] [address] [phone]       Follow up with [facility].   Why:  Resumption of home Oxygen   Contact information:   [place]  [address] [phone]        The results of significant diagnostics from this hospitalization (including imaging, microbiology, ancillary and laboratory) are listed below for reference.    Significant Diagnostic Studies: Dg Chest Portable 1 View  12/30/2014  CLINICAL DATA:  Shortness of breath.  History of lung carcinoma EXAM: PORTABLE CHEST 1 VIEW  COMPARISON:  Chest radiograph October 22, 2014; PET-CT November 18, 2014 FINDINGS: There is airspace consolidation in the left mid lower lung zones with small left effusion. There is consolidation in the medial right base as well. The heart is enlarged with pulmonary vascularity within normal limits. No adenopathy is apparent by radiography. Port-A-Cath tip is in the left innominate vein. No pneumothorax. No bone lesions. IMPRESSION: Persistent consolidation medial right base. Patchy consolidation left mid and lower lung zones with small left effusion. Stable cardiomegaly. Electronically Signed   By: Lowella Grip III M.D.   On: 12/30/2014 12:06    Microbiology: No results found for this or any previous visit (from the past 240 hour(s)).   Labs: Basic Metabolic Panel:  Recent Labs Lab 01/01/15 0457 01/02/15 0335 01/02/15 0745 01/03/15 0650 01/04/15 0454 01/05/15 1045  NA 136 133*  --  138 137 137  K 3.3* 5.6* 3.6 3.7 3.8 3.4*  CL 94* 100*  --  100* 97* 98*  CO2 31 23  --  32 34* 30  GLUCOSE 116* 117*  --  107* 109* 93  BUN 78* 73*  --  63* 59* 49*  CREATININE 2.10* 1.73*  --  1.81* 1.88* 1.77*  CALCIUM 8.5* 8.1*  --  8.3* 8.4* 8.8*   Liver Function Tests: No results for input(s): AST, ALT, ALKPHOS, BILITOT, PROT, ALBUMIN in the last 168 hours. No results for input(s): LIPASE, AMYLASE in the last 168 hours. No results for input(s): AMMONIA in the last 168 hours. CBC:  Recent Labs Lab 12/30/14 1111 12/31/14 0230 01/05/15 0455  WBC 6.1 2.6* 3.7*  NEUTROABS 4.6  --   --   HGB 11.9* 11.0* 11.3*  HCT 38.5* 35.4* 37.1*  MCV 95.3 94.9 94.6  PLT 190 173 287   Cardiac Enzymes:  Recent Labs Lab 12/30/14 1324 12/30/14 1526 12/30/14 2040 12/31/14 0230 01/01/15 0457  TROPONINI 0.26* 0.27* 0.25* 0.18* 0.15*   BNP: BNP (last 3 results)  Recent Labs  04/30/14 0522 10/22/14 1320 12/30/14 1214  BNP 681.5* 451.1* 1096.9*     CBG:  Recent Labs Lab 12/31/14 1637  01/04/15 0607  GLUCAP 127* 82     Signed:  Shakedra Beam S  [facility] 01/05/2015, 11:50 AM

## 2015-01-05 NOTE — Progress Notes (Signed)
Reviewed discharge instructions with patient and family. Porta cath deaccessed. Awaiting discharge.  Javonne Dorko, Mervin Kung RN
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
## 2015-01-06 ENCOUNTER — Other Ambulatory Visit: Payer: Self-pay | Admitting: Medical Oncology

## 2015-01-06 DIAGNOSIS — C3490 Malignant neoplasm of unspecified part of unspecified bronchus or lung: Secondary | ICD-10-CM

## 2015-01-07 ENCOUNTER — Other Ambulatory Visit: Payer: Self-pay

## 2015-01-07 ENCOUNTER — Telehealth: Payer: Self-pay | Admitting: Medical Oncology

## 2015-01-07 ENCOUNTER — Ambulatory Visit (HOSPITAL_BASED_OUTPATIENT_CLINIC_OR_DEPARTMENT_OTHER): Payer: Commercial Managed Care - HMO

## 2015-01-07 ENCOUNTER — Other Ambulatory Visit (HOSPITAL_BASED_OUTPATIENT_CLINIC_OR_DEPARTMENT_OTHER): Payer: Commercial Managed Care - HMO

## 2015-01-07 ENCOUNTER — Ambulatory Visit (HOSPITAL_BASED_OUTPATIENT_CLINIC_OR_DEPARTMENT_OTHER): Payer: Commercial Managed Care - HMO | Admitting: Physician Assistant

## 2015-01-07 VITALS — BP 102/60 | HR 61 | Resp 18

## 2015-01-07 DIAGNOSIS — C3432 Malignant neoplasm of lower lobe, left bronchus or lung: Secondary | ICD-10-CM

## 2015-01-07 DIAGNOSIS — C3412 Malignant neoplasm of upper lobe, left bronchus or lung: Secondary | ICD-10-CM

## 2015-01-07 DIAGNOSIS — C7951 Secondary malignant neoplasm of bone: Secondary | ICD-10-CM | POA: Diagnosis not present

## 2015-01-07 DIAGNOSIS — Z5112 Encounter for antineoplastic immunotherapy: Secondary | ICD-10-CM | POA: Diagnosis not present

## 2015-01-07 DIAGNOSIS — C3411 Malignant neoplasm of upper lobe, right bronchus or lung: Secondary | ICD-10-CM

## 2015-01-07 DIAGNOSIS — C3490 Malignant neoplasm of unspecified part of unspecified bronchus or lung: Secondary | ICD-10-CM

## 2015-01-07 LAB — COMPREHENSIVE METABOLIC PANEL
ALBUMIN: 2.8 g/dL — AB (ref 3.5–5.0)
ALK PHOS: 65 U/L (ref 40–150)
ALT: 19 U/L (ref 0–55)
ANION GAP: 10 meq/L (ref 3–11)
AST: 21 U/L (ref 5–34)
BUN: 53.2 mg/dL — AB (ref 7.0–26.0)
CALCIUM: 9 mg/dL (ref 8.4–10.4)
CO2: 27 mEq/L (ref 22–29)
CREATININE: 2 mg/dL — AB (ref 0.7–1.3)
Chloride: 103 mEq/L (ref 98–109)
EGFR: 39 mL/min/{1.73_m2} — ABNORMAL LOW (ref >90)
Glucose: 87 mg/dl (ref 70–140)
Potassium: 3.8 mEq/L (ref 3.5–5.1)
Sodium: 140 mEq/L (ref 136–145)
Total Bilirubin: 0.74 mg/dL (ref 0.20–1.20)
Total Protein: 6.2 g/dL — ABNORMAL LOW (ref 6.4–8.3)

## 2015-01-07 LAB — CBC WITH DIFFERENTIAL/PLATELET
BASO%: 0 % (ref 0.0–2.0)
BASOS ABS: 0 10*3/uL (ref 0.0–0.1)
EOS%: 0.2 % (ref 0.0–7.0)
Eosinophils Absolute: 0 10*3/uL (ref 0.0–0.5)
HEMATOCRIT: 41.9 % (ref 38.4–49.9)
HEMOGLOBIN: 12.8 g/dL — AB (ref 13.0–17.1)
LYMPH#: 0.5 10*3/uL — AB (ref 0.9–3.3)
LYMPH%: 9.4 % — ABNORMAL LOW (ref 14.0–49.0)
MCH: 29.2 pg (ref 27.2–33.4)
MCHC: 30.5 g/dL — AB (ref 32.0–36.0)
MCV: 95.7 fL (ref 79.3–98.0)
MONO#: 0.5 10*3/uL (ref 0.1–0.9)
MONO%: 8.2 % (ref 0.0–14.0)
NEUT#: 4.7 10*3/uL (ref 1.5–6.5)
NEUT%: 82.2 % — AB (ref 39.0–75.0)
PLATELETS: 232 10*3/uL (ref 140–400)
RBC: 4.38 10*6/uL (ref 4.20–5.82)
RDW: 17.3 % — AB (ref 11.0–14.6)
WBC: 5.7 10*3/uL (ref 4.0–10.3)

## 2015-01-07 MED ORDER — SODIUM CHLORIDE 0.9 % IV SOLN
Freq: Once | INTRAVENOUS | Status: AC
  Administered 2015-01-07: 13:00:00 via INTRAVENOUS

## 2015-01-07 MED ORDER — LIDOCAINE-PRILOCAINE 2.5-2.5 % EX CREA
TOPICAL_CREAM | CUTANEOUS | Status: AC
  Filled 2015-01-07: qty 5

## 2015-01-07 MED ORDER — SODIUM CHLORIDE 0.9 % IJ SOLN
10.0000 mL | INTRAMUSCULAR | Status: DC | PRN
  Administered 2015-01-07: 10 mL
  Filled 2015-01-07: qty 10

## 2015-01-07 MED ORDER — NIVOLUMAB CHEMO INJECTION 100 MG/10ML
240.0000 mg | Freq: Once | INTRAVENOUS | Status: AC
  Administered 2015-01-07: 240 mg via INTRAVENOUS
  Filled 2015-01-07: qty 24

## 2015-01-07 MED ORDER — HEPARIN SOD (PORK) LOCK FLUSH 100 UNIT/ML IV SOLN
500.0000 [IU] | Freq: Once | INTRAVENOUS | Status: AC | PRN
  Administered 2015-01-07: 500 [IU]
  Filled 2015-01-07: qty 5
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
## 2015-01-07 NOTE — Telephone Encounter (Signed)
[name] is not a pt at [facility] . I instructed pt to contact PCP for rx for portable oxygen . He voices understanding.

## 2015-01-07 NOTE — Patient Instructions (Signed)
[facility] Discharge Instructions for Patients Receiving Chemotherapy  Today you received the following chemotherapy agents Nivolumab.  To help prevent nausea and vomiting after your treatment, we encourage you to take your nausea medication as prescribed.   If you develop nausea and vomiting that is not controlled by your nausea medication, call the clinic.   BELOW ARE SYMPTOMS THAT SHOULD BE REPORTED IMMEDIATELY:  *FEVER GREATER THAN 100.5 F  *CHILLS WITH OR WITHOUT FEVER  NAUSEA AND VOMITING THAT IS NOT CONTROLLED WITH YOUR NAUSEA MEDICATION  *UNUSUAL SHORTNESS OF BREATH  *UNUSUAL BRUISING OR BLEEDING  TENDERNESS IN MOUTH AND THROAT WITH OR WITHOUT PRESENCE OF ULCERS  *URINARY PROBLEMS  *BOWEL PROBLEMS  UNUSUAL RASH Items with * indicate a potential emergency and should be followed up as soon as possible.  Feel free to call the clinic you have any questions or concerns. The clinic phone number is [phone].  Please show the CHEMO ALERT CARD at check-in to the Emergency Department and triage nurse.   

## 2015-01-07 NOTE — Progress Notes (Signed)
Called [facility], Lecretia , to provide portable tank. She will evaluate and bring tank to pt at home.

## 2015-01-07 NOTE — Progress Notes (Signed)
Ok to treat with Creat 2.0 per Sharene Butters, PA.

## 2015-01-08 ENCOUNTER — Other Ambulatory Visit: Payer: Self-pay

## 2015-01-08 NOTE — Progress Notes (Signed)
   Successful contact with patient for community care coordination needs and transition of care. Patient  Was discharged on 12/06 after admission for shortness of breath.  Plan home visit next week for assessment of care needs.

## 2015-01-08 NOTE — Patient Outreach (Signed)
This RNCM was successful in making contact with patient for community care coordination. Patient stated his date of birth and address to satisfy HIPPA identifiers.  Patient was recently released from the acute care setting after being admitted for shortness of breath. Patient stated he was currently being interviewed by RN from [place] for intake. Patient has skilled services through [facility] and receives his respiratory supplies through [facility].  Patient continues to seek information for advanced planning.  Patient states he is going to continue chemotherapy. This RNCM and patient plan home visit for continued assessment of community care coordination needs.

## 2015-01-11 ENCOUNTER — Other Ambulatory Visit: Payer: Self-pay

## 2015-01-11 NOTE — Patient Outreach (Signed)
This RNCM was successful in making contact with patient to further assess his community care coordination needs and to schedule a  Home visit. Patient identified himself by providing his name, address and date of birth. Patient states he no longer qualifies for home care due to his not being home bound. Patient states he refuses to be home bound.  Patient advised being home bound is not a criteria for receiving [employer] benefits as our focus is on care coordination.  Patient states he wants to enjoy life as long as he can while he can. Patient and this RNCM agreed on home visit for Wednesday, December 14 for further COPD education, advanced planning discussion

## 2015-01-13 ENCOUNTER — Other Ambulatory Visit: Payer: Self-pay

## 2015-01-13 NOTE — Progress Notes (Signed)
Met patient at home for community care coordination assessment and assess for management of COPD. Patient continues on oxygen at 2 liters per minute via nasal cannula with oxygen concentration at 91% at rest.  Patient's blood pressure was in 90/50, patient denies dizziness or blurred visit.  Patient has appointment with Dr. Terrence Dupont, Cardiologist today.  This RNCM made call to office to inform doctor, however, office was closed for lunch.  Message left for physician with this RNCM's telephone number.  Fax was also sent to office with vital signs for physician to view.    Son is home with patient and agreed to call 911 if patient becomes symptomatic.  Son states he will be taking patient to his appointment this afternoon.  Plan: Telephone follow up on 12/29

## 2015-01-18 ENCOUNTER — Telehealth: Payer: Self-pay | Admitting: Internal Medicine

## 2015-01-18 NOTE — Telephone Encounter (Signed)
Per MM moved 12/22 f/u from MM to APP. Adjusted remaining appointments - left message for patient and mailed schedule.

## 2015-01-19 ENCOUNTER — Emergency Department (HOSPITAL_COMMUNITY): Payer: Commercial Managed Care - HMO

## 2015-01-19 ENCOUNTER — Inpatient Hospital Stay (HOSPITAL_COMMUNITY)
Admission: EM | Admit: 2015-01-19 | Discharge: 2015-01-24 | DRG: 291 | Disposition: A | Discharge status: Home / Self Care | Payer: Commercial Managed Care - HMO | Attending: Cardiology | Admitting: Cardiology

## 2015-01-19 ENCOUNTER — Encounter (HOSPITAL_COMMUNITY): Payer: Self-pay | Admitting: Emergency Medicine

## 2015-01-19 DIAGNOSIS — Z801 Family history of malignant neoplasm of trachea, bronchus and lung: Secondary | ICD-10-CM | POA: Diagnosis not present

## 2015-01-19 DIAGNOSIS — I071 Rheumatic tricuspid insufficiency: Secondary | ICD-10-CM | POA: Diagnosis present

## 2015-01-19 DIAGNOSIS — Z9889 Other specified postprocedural states: Secondary | ICD-10-CM | POA: Diagnosis not present

## 2015-01-19 DIAGNOSIS — I5023 Acute on chronic systolic (congestive) heart failure: Secondary | ICD-10-CM | POA: Diagnosis present

## 2015-01-19 DIAGNOSIS — Z8249 Family history of ischemic heart disease and other diseases of the circulatory system: Secondary | ICD-10-CM | POA: Diagnosis not present

## 2015-01-19 DIAGNOSIS — J9621 Acute and chronic respiratory failure with hypoxia: Secondary | ICD-10-CM | POA: Diagnosis present

## 2015-01-19 DIAGNOSIS — J8410 Pulmonary fibrosis, unspecified: Secondary | ICD-10-CM | POA: Diagnosis present

## 2015-01-19 DIAGNOSIS — E876 Hypokalemia: Secondary | ICD-10-CM | POA: Diagnosis present

## 2015-01-19 DIAGNOSIS — Z9981 Dependence on supplemental oxygen: Secondary | ICD-10-CM

## 2015-01-19 DIAGNOSIS — Z9221 Personal history of antineoplastic chemotherapy: Secondary | ICD-10-CM

## 2015-01-19 DIAGNOSIS — C3490 Malignant neoplasm of unspecified part of unspecified bronchus or lung: Secondary | ICD-10-CM | POA: Diagnosis present

## 2015-01-19 DIAGNOSIS — J9620 Acute and chronic respiratory failure, unspecified whether with hypoxia or hypercapnia: Secondary | ICD-10-CM | POA: Diagnosis present

## 2015-01-19 DIAGNOSIS — I959 Hypotension, unspecified: Secondary | ICD-10-CM | POA: Diagnosis present

## 2015-01-19 DIAGNOSIS — Z902 Acquired absence of lung [part of]: Secondary | ICD-10-CM | POA: Diagnosis not present

## 2015-01-19 DIAGNOSIS — Z87442 Personal history of urinary calculi: Secondary | ICD-10-CM

## 2015-01-19 DIAGNOSIS — Z923 Personal history of irradiation: Secondary | ICD-10-CM | POA: Diagnosis not present

## 2015-01-19 DIAGNOSIS — I872 Venous insufficiency (chronic) (peripheral): Secondary | ICD-10-CM | POA: Diagnosis present

## 2015-01-19 DIAGNOSIS — J449 Chronic obstructive pulmonary disease, unspecified: Secondary | ICD-10-CM | POA: Diagnosis present

## 2015-01-19 DIAGNOSIS — Z8673 Personal history of transient ischemic attack (TIA), and cerebral infarction without residual deficits: Secondary | ICD-10-CM

## 2015-01-19 DIAGNOSIS — R0603 Acute respiratory distress: Secondary | ICD-10-CM

## 2015-01-19 DIAGNOSIS — Z825 Family history of asthma and other chronic lower respiratory diseases: Secondary | ICD-10-CM

## 2015-01-19 DIAGNOSIS — I130 Hypertensive heart and chronic kidney disease with heart failure and stage 1 through stage 4 chronic kidney disease, or unspecified chronic kidney disease: Secondary | ICD-10-CM | POA: Diagnosis present

## 2015-01-19 DIAGNOSIS — N184 Chronic kidney disease, stage 4 (severe): Secondary | ICD-10-CM | POA: Diagnosis present

## 2015-01-19 DIAGNOSIS — R011 Cardiac murmur, unspecified: Secondary | ICD-10-CM | POA: Diagnosis present

## 2015-01-19 DIAGNOSIS — G4733 Obstructive sleep apnea (adult) (pediatric): Secondary | ICD-10-CM | POA: Diagnosis present

## 2015-01-19 DIAGNOSIS — Z87891 Personal history of nicotine dependence: Secondary | ICD-10-CM | POA: Diagnosis not present

## 2015-01-19 DIAGNOSIS — R0600 Dyspnea, unspecified: Secondary | ICD-10-CM | POA: Diagnosis not present

## 2015-01-19 DIAGNOSIS — N179 Acute kidney failure, unspecified: Secondary | ICD-10-CM

## 2015-01-19 DIAGNOSIS — I272 Other secondary pulmonary hypertension: Secondary | ICD-10-CM | POA: Diagnosis present

## 2015-01-19 DIAGNOSIS — Z800 Family history of malignant neoplasm of digestive organs: Secondary | ICD-10-CM | POA: Diagnosis not present

## 2015-01-19 HISTORY — DX: Acute and chronic respiratory failure with hypoxia: J96.21

## 2015-01-19 HISTORY — DX: Malignant neoplasm of unspecified part of unspecified bronchus or lung: C34.90

## 2015-01-19 HISTORY — DX: Rheumatic tricuspid insufficiency: I07.1

## 2015-01-19 HISTORY — DX: Pulmonary hypertension, unspecified: I27.20

## 2015-01-19 HISTORY — DX: Pulmonary fibrosis, unspecified: J84.10

## 2015-01-19 HISTORY — DX: Chronic kidney disease, stage 4 (severe): N18.4

## 2015-01-19 LAB — CBC
HEMATOCRIT: 39.8 % (ref 39.0–52.0)
Hemoglobin: 12.4 g/dL — ABNORMAL LOW (ref 13.0–17.0)
MCH: 29.5 pg (ref 26.0–34.0)
MCHC: 31.2 g/dL (ref 30.0–36.0)
MCV: 94.8 fL (ref 78.0–100.0)
PLATELETS: 137 10*3/uL — AB (ref 150–400)
RBC: 4.2 MIL/uL — ABNORMAL LOW (ref 4.22–5.81)
RDW: 18.6 % — AB (ref 11.5–15.5)
WBC: 5.4 10*3/uL (ref 4.0–10.5)

## 2015-01-19 LAB — CBC WITH DIFFERENTIAL/PLATELET
BASOS ABS: 0 10*3/uL (ref 0.0–0.1)
BASOS PCT: 0 %
EOS ABS: 0 10*3/uL (ref 0.0–0.7)
Eosinophils Relative: 0 %
HCT: 38.6 % — ABNORMAL LOW (ref 39.0–52.0)
HEMOGLOBIN: 11.6 g/dL — AB (ref 13.0–17.0)
Lymphocytes Relative: 8 %
Lymphs Abs: 0.4 10*3/uL — ABNORMAL LOW (ref 0.7–4.0)
MCH: 28.9 pg (ref 26.0–34.0)
MCHC: 30.1 g/dL (ref 30.0–36.0)
MCV: 96 fL (ref 78.0–100.0)
Monocytes Absolute: 0.5 10*3/uL (ref 0.1–1.0)
Monocytes Relative: 11 %
NEUTROS PCT: 81 %
Neutro Abs: 3.7 10*3/uL (ref 1.7–7.7)
Platelets: 144 10*3/uL — ABNORMAL LOW (ref 150–400)
RBC: 4.02 MIL/uL — AB (ref 4.22–5.81)
RDW: 18.7 % — ABNORMAL HIGH (ref 11.5–15.5)
WBC: 4.6 10*3/uL (ref 4.0–10.5)

## 2015-01-19 LAB — BASIC METABOLIC PANEL
Anion gap: 8 (ref 5–15)
BUN: 50 mg/dL — AB (ref 6–20)
CALCIUM: 8.5 mg/dL — AB (ref 8.9–10.3)
CO2: 26 mmol/L (ref 22–32)
CREATININE: 2.36 mg/dL — AB (ref 0.61–1.24)
Chloride: 103 mmol/L (ref 101–111)
GFR calc Af Amer: 31 mL/min — ABNORMAL LOW (ref >60)
GFR, EST NON AFRICAN AMERICAN: 26 mL/min — AB (ref >60)
GLUCOSE: 110 mg/dL — AB (ref 65–99)
POTASSIUM: 4.1 mmol/L (ref 3.5–5.1)
SODIUM: 137 mmol/L (ref 135–145)

## 2015-01-19 LAB — COMPREHENSIVE METABOLIC PANEL
ALBUMIN: 2.9 g/dL — AB (ref 3.5–5.0)
ALK PHOS: 89 U/L (ref 38–126)
ALT: 16 U/L — ABNORMAL LOW (ref 17–63)
ANION GAP: 12 (ref 5–15)
AST: 28 U/L (ref 15–41)
BUN: 49 mg/dL — ABNORMAL HIGH (ref 6–20)
CALCIUM: 8.5 mg/dL — AB (ref 8.9–10.3)
CO2: 25 mmol/L (ref 22–32)
Chloride: 100 mmol/L — ABNORMAL LOW (ref 101–111)
Creatinine, Ser: 2.2 mg/dL — ABNORMAL HIGH (ref 0.61–1.24)
GFR calc non Af Amer: 29 mL/min — ABNORMAL LOW (ref >60)
GFR, EST AFRICAN AMERICAN: 33 mL/min — AB (ref >60)
GLUCOSE: 127 mg/dL — AB (ref 65–99)
POTASSIUM: 3.7 mmol/L (ref 3.5–5.1)
SODIUM: 137 mmol/L (ref 135–145)
TOTAL PROTEIN: 6.2 g/dL — AB (ref 6.5–8.1)
Total Bilirubin: 1.4 mg/dL — ABNORMAL HIGH (ref 0.3–1.2)

## 2015-01-19 LAB — BRAIN NATRIURETIC PEPTIDE
B NATRIURETIC PEPTIDE 5: 1281 pg/mL — AB (ref 0.0–100.0)
B Natriuretic Peptide: 1452.8 pg/mL — ABNORMAL HIGH (ref 0.0–100.0)

## 2015-01-19 LAB — I-STAT TROPONIN, ED: Troponin i, poc: 0.04 ng/mL (ref 0.00–0.08)

## 2015-01-19 LAB — TROPONIN I: Troponin I: 0.05 ng/mL — ABNORMAL HIGH (ref <0.031)

## 2015-01-19 LAB — DIGOXIN LEVEL: Digoxin Level: 0.7 ng/mL — ABNORMAL LOW (ref 0.8–2.0)

## 2015-01-19 MED ORDER — ACETAMINOPHEN 325 MG PO TABS: 650.0000 mg | ORAL_TABLET | ORAL | Status: DC | PRN

## 2015-01-19 MED ORDER — ONDANSETRON HCL 4 MG/2ML IJ SOLN: 4.0000 mg | Freq: Four times a day (QID) | INTRAMUSCULAR | Status: DC | PRN

## 2015-01-19 MED ORDER — ALBUTEROL (5 MG/ML) CONTINUOUS INHALATION SOLN
10.0000 mg/h | INHALATION_SOLUTION | RESPIRATORY_TRACT | Status: DC
  Administered 2015-01-19: 10 mg/h via RESPIRATORY_TRACT
  Filled 2015-01-19: qty 20

## 2015-01-19 MED ORDER — SODIUM CHLORIDE 0.9 % IJ SOLN
3.0000 mL | Freq: Two times a day (BID) | INTRAMUSCULAR | Status: DC
  Administered 2015-01-21 – 2015-01-23 (×2): 3 mL via INTRAVENOUS

## 2015-01-19 MED ORDER — ALLOPURINOL 100 MG PO TABS
100.0000 mg | ORAL_TABLET | Freq: Every day | ORAL | Status: DC
Start: 2015-01-20 — End: 2015-01-24
  Administered 2015-01-20 – 2015-01-24 (×5): 100 mg via ORAL
  Filled 2015-01-19 (×5): qty 1

## 2015-01-19 MED ORDER — ENSURE ENLIVE PO LIQD: 237.0000 mL | Freq: Two times a day (BID) | ORAL | Status: DC

## 2015-01-19 MED ORDER — FUROSEMIDE 10 MG/ML IJ SOLN
40.0000 mg | Freq: Two times a day (BID) | INTRAMUSCULAR | Status: DC
  Administered 2015-01-19 – 2015-01-23 (×5): 40 mg via INTRAVENOUS
  Filled 2015-01-19 (×8): qty 4

## 2015-01-19 MED ORDER — POTASSIUM CHLORIDE CRYS ER 20 MEQ PO TBCR
20.0000 meq | EXTENDED_RELEASE_TABLET | Freq: Once | ORAL | Status: AC
  Administered 2015-01-19: 20 meq via ORAL
  Filled 2015-01-19: qty 1

## 2015-01-19 MED ORDER — ASPIRIN EC 81 MG PO TBEC
81.0000 mg | DELAYED_RELEASE_TABLET | Freq: Every day | ORAL | Status: DC
  Administered 2015-01-20 – 2015-01-24 (×5): 81 mg via ORAL
  Filled 2015-01-19 (×5): qty 1

## 2015-01-19 MED ORDER — ACETAZOLAMIDE 250 MG PO TABS
250.0000 mg | ORAL_TABLET | Freq: Every day | ORAL | Status: DC
  Administered 2015-01-22 – 2015-01-24 (×3): 250 mg via ORAL
  Filled 2015-01-19 (×5): qty 1

## 2015-01-19 MED ORDER — LEVALBUTEROL HCL 1.25 MG/0.5ML IN NEBU
1.2500 mg | INHALATION_SOLUTION | Freq: Four times a day (QID) | RESPIRATORY_TRACT | Status: DC
  Administered 2015-01-20: 1.25 mg via RESPIRATORY_TRACT
  Filled 2015-01-19: qty 0.5

## 2015-01-19 MED ORDER — HEPARIN SODIUM (PORCINE) 5000 UNIT/ML IJ SOLN
5000.0000 [IU] | Freq: Three times a day (TID) | INTRAMUSCULAR | Status: DC
  Administered 2015-01-19 – 2015-01-24 (×13): 5000 [IU] via SUBCUTANEOUS
  Filled 2015-01-19 (×12): qty 1

## 2015-01-19 MED ORDER — SODIUM CHLORIDE 0.9 % IV SOLN: 250.0000 mL | INTRAVENOUS | Status: DC | PRN

## 2015-01-19 MED ORDER — TIOTROPIUM BROMIDE MONOHYDRATE 18 MCG IN CAPS: 18.0000 ug | ORAL_CAPSULE | Freq: Every day | RESPIRATORY_TRACT | Status: DC

## 2015-01-19 MED ORDER — POTASSIUM CHLORIDE CRYS ER 20 MEQ PO TBCR
20.0000 meq | EXTENDED_RELEASE_TABLET | Freq: Every day | ORAL | Status: DC
  Administered 2015-01-20 – 2015-01-24 (×5): 20 meq via ORAL
  Filled 2015-01-19 (×5): qty 1

## 2015-01-19 MED ORDER — DIGOXIN 125 MCG PO TABS
0.0625 mg | ORAL_TABLET | Freq: Every day | ORAL | Status: DC
  Administered 2015-01-20 – 2015-01-24 (×5): 0.0625 mg via ORAL
  Filled 2015-01-19 (×5): qty 1

## 2015-01-19 MED ORDER — SODIUM CHLORIDE 0.9 % IJ SOLN: 3.0000 mL | INTRAMUSCULAR | Status: DC | PRN
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
## 2015-01-19 NOTE — Progress Notes (Signed)
IV Team Note;   Called to access right chest portacath;  Accessed easily on first attempt with good blood return;  Spoke with RN re varying chest xray reports for tip of port;  Some are reporting tip is in innominate vein, some SVC confluence; please verify placement;  Thank you;    Raynelle Fanning RN IV Team

## 2015-01-19 NOTE — ED Notes (Signed)
Pt O2 76% on RA.  Placed on 3L [place], O2 sat 94%.

## 2015-01-19 NOTE — ED Notes (Signed)
Inpatient RN unavailable for report, will call back.
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
## 2015-01-19 NOTE — ED Notes (Signed)
Pt sts SOB x 1 week with worsening swelling in legs and SOB worse with exertion; pt sts some chest tightness

## 2015-01-20 ENCOUNTER — Other Ambulatory Visit: Payer: Self-pay | Admitting: Medical Oncology

## 2015-01-20 ENCOUNTER — Telehealth: Payer: Self-pay | Admitting: *Deleted

## 2015-01-20 DIAGNOSIS — C3412 Malignant neoplasm of upper lobe, left bronchus or lung: Secondary | ICD-10-CM

## 2015-01-20 LAB — BASIC METABOLIC PANEL
Anion gap: 11 (ref 5–15)
BUN: 49 mg/dL — AB (ref 6–20)
CALCIUM: 8.7 mg/dL — AB (ref 8.9–10.3)
CO2: 24 mmol/L (ref 22–32)
Chloride: 101 mmol/L (ref 101–111)
Creatinine, Ser: 2.06 mg/dL — ABNORMAL HIGH (ref 0.61–1.24)
GFR calc Af Amer: 36 mL/min — ABNORMAL LOW (ref >60)
GFR, EST NON AFRICAN AMERICAN: 31 mL/min — AB (ref >60)
GLUCOSE: 112 mg/dL — AB (ref 65–99)
Potassium: 4 mmol/L (ref 3.5–5.1)
Sodium: 136 mmol/L (ref 135–145)

## 2015-01-20 LAB — GLUCOSE, CAPILLARY
GLUCOSE-CAPILLARY: 88 mg/dL (ref 65–99)
Glucose-Capillary: 114 mg/dL — ABNORMAL HIGH (ref 65–99)
Glucose-Capillary: 103 mg/dL — ABNORMAL HIGH (ref 65–99)
Glucose-Capillary: 100 mg/dL — ABNORMAL HIGH (ref 65–99)

## 2015-01-20 MED ORDER — SODIUM CHLORIDE 0.9 % IJ SOLN
10.0000 mL | Freq: Two times a day (BID) | INTRAMUSCULAR | Status: DC
  Administered 2015-01-23: 10 mL

## 2015-01-20 MED ORDER — LEVALBUTEROL HCL 1.25 MG/0.5ML IN NEBU
1.2500 mg | INHALATION_SOLUTION | Freq: Four times a day (QID) | RESPIRATORY_TRACT | Status: DC
  Administered 2015-01-20 (×2): 1.25 mg via RESPIRATORY_TRACT
  Filled 2015-01-20 (×2): qty 0.5

## 2015-01-20 MED ORDER — GUAIFENESIN-DM 100-10 MG/5ML PO SYRP
5.0000 mL | ORAL_SOLUTION | ORAL | Status: DC | PRN
  Administered 2015-01-20: 5 mL via ORAL
  Filled 2015-01-20 (×2): qty 5

## 2015-01-20 MED ORDER — SODIUM CHLORIDE 0.9 % IJ SOLN
10.0000 mL | INTRAMUSCULAR | Status: DC | PRN
  Administered 2015-01-21 – 2015-01-24 (×5): 10 mL
  Filled 2015-01-20 (×5): qty 40

## 2015-01-20 NOTE — Consult Note (Signed)
   [facility] Inpatient Consult   01/20/2015  Shamarcus Hoheisel [facility] 1945-11-06 [id number] Patient is currently active with [facility] for chronic disease management services.  Patient has been engaged by a [employer]. Visited with patient at bedside.  Son present as well.  Patient denies any difficulties with managing at home but was acceptable to ongoing follow up.    Our community based plan of care has focused on disease management and community resource support.  Patient will receive a post discharge transition of care call and will be evaluated for monthly home visits for assessments and disease process education.  Made Inpatient Case Manager aware that [facility] following. Of note, [facility] Management services does not replace or interfere with any services that are needed or arranged by inpatient case management or social work.  For additional questions or referrals please contact: Natividad Brood, RN BSN [facility] Liaison  [phone] business mobile phone

## 2015-01-20 NOTE — Progress Notes (Signed)
Nutrition Brief Note  Patient identified on the Malnutrition Screening Tool (MST) Report  Wt Readings from Last 15 Encounters:  01/20/15 242 lb 4.8 oz (109.907 kg)  01/05/15 258 lb 2.5 oz (117.1 kg)  12/23/14 266 lb 9.6 oz (120.929 kg)  12/10/14 262 lb 12.8 oz (119.205 kg)  12/09/14 262 lb (118.842 kg)  11/26/14 258 lb 1.6 oz (117.073 kg)  11/19/14 252 lb 14.4 oz (114.715 kg)  11/05/14 248 lb 3.2 oz (112.583 kg)  10/28/14 246 lb 8 oz (111.812 kg)  06/30/14 286 lb 11.2 oz (130.046 kg)  05/01/14 273 lb 9.6 oz (124.104 kg)  04/16/14 292 lb 8.5 oz (132.69 kg)  03/23/14 295 lb 11.2 oz (134.129 kg)  01/02/14 290 lb (131.543 kg)  12/20/13 310 lb 6.4 oz (140.797 kg)    Body mass index is 35.77 kg/(m^2). Patient meets criteria for Obesity based on current BMI. Pt relates weight loss to receiving high dose of Lasix over the past several months. Per weight history, weight is stable from September with fluid weight gain over the past few months. Pt reports following a low sodium diet at home; he denies any education needs or questions at this time. He states his appetite is good and he eats well every day.   Current diet order is Heart Healthy, patient is consuming approximately 100% of meals at this time. Labs and medications reviewed.   No nutrition interventions warranted at this time. If nutrition issues arise, please consult RD.   Scarlette Ar RD, LDN Inpatient Clinical Dietitian Pager: [phone] After Hours Pager: [phone]
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
## 2015-01-20 NOTE — Progress Notes (Signed)
BP 74/52 pt denies any dizziness while sitting on upon standing or increased shortness of breath. Dr Terrence Dupont made aware. Held Diuretics this am.

## 2015-01-20 NOTE — Telephone Encounter (Signed)
Call received from patient's son reporting [name] is hospitalized.  He will not be able to keep tomorrow's appointments.  Will you all let us know when he does need to come in?"    This nurse advised he call [facility] when discharged from hospital.  Appointments will be rescheduled at that time.  Currently room 3E22C-01.

## 2015-01-20 NOTE — Progress Notes (Signed)
Pt bp 82/52 Md made aware orders to hold Lasix.

## 2015-01-21 ENCOUNTER — Other Ambulatory Visit: Payer: Commercial Managed Care - HMO

## 2015-01-21 ENCOUNTER — Ambulatory Visit: Payer: Commercial Managed Care - HMO | Admitting: Physician Assistant

## 2015-01-21 ENCOUNTER — Ambulatory Visit: Payer: Commercial Managed Care - HMO

## 2015-01-21 LAB — CBC
HCT: 37.2 % — ABNORMAL LOW (ref 39.0–52.0)
HEMOGLOBIN: 11.2 g/dL — AB (ref 13.0–17.0)
MCH: 28.6 pg (ref 26.0–34.0)
MCHC: 30.1 g/dL (ref 30.0–36.0)
MCV: 94.9 fL (ref 78.0–100.0)
Platelets: 149 10*3/uL — ABNORMAL LOW (ref 150–400)
RBC: 3.92 MIL/uL — AB (ref 4.22–5.81)
RDW: 18.7 % — ABNORMAL HIGH (ref 11.5–15.5)
WBC: 4.2 10*3/uL (ref 4.0–10.5)

## 2015-01-21 LAB — BASIC METABOLIC PANEL
ANION GAP: 10 (ref 5–15)
BUN: 49 mg/dL — ABNORMAL HIGH (ref 6–20)
CO2: 23 mmol/L (ref 22–32)
Calcium: 8.5 mg/dL — ABNORMAL LOW (ref 8.9–10.3)
Chloride: 101 mmol/L (ref 101–111)
Creatinine, Ser: 1.92 mg/dL — ABNORMAL HIGH (ref 0.61–1.24)
GFR calc Af Amer: 39 mL/min — ABNORMAL LOW (ref >60)
GFR, EST NON AFRICAN AMERICAN: 34 mL/min — AB (ref >60)
Glucose, Bld: 109 mg/dL — ABNORMAL HIGH (ref 65–99)
POTASSIUM: 4.1 mmol/L (ref 3.5–5.1)
SODIUM: 134 mmol/L — AB (ref 135–145)

## 2015-01-21 LAB — GLUCOSE, CAPILLARY
GLUCOSE-CAPILLARY: 93 mg/dL (ref 65–99)
GLUCOSE-CAPILLARY: 91 mg/dL (ref 65–99)
Glucose-Capillary: 94 mg/dL (ref 65–99)
Glucose-Capillary: 95 mg/dL (ref 65–99)

## 2015-01-21 LAB — BRAIN NATRIURETIC PEPTIDE: B NATRIURETIC PEPTIDE 5: 970.3 pg/mL — AB (ref 0.0–100.0)

## 2015-01-21 MED ORDER — LEVALBUTEROL HCL 1.25 MG/0.5ML IN NEBU
1.2500 mg | INHALATION_SOLUTION | Freq: Four times a day (QID) | RESPIRATORY_TRACT | Status: DC
  Administered 2015-01-21 – 2015-01-23 (×9): 1.25 mg via RESPIRATORY_TRACT
  Filled 2015-01-21 (×9): qty 0.5
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
## 2015-01-21 NOTE — Progress Notes (Signed)
Pt asymptomatic with  BP 83/53. MD orders to hold IV lasix. Will continue to monitor.

## 2015-01-21 NOTE — Progress Notes (Signed)
Pt BP 93/62. Pt asymptomatic. Paged PA to determine whether to give lasix '40mg'$  IV. Will hold until I hear back.

## 2015-01-21 NOTE — Progress Notes (Signed)
Patient is active with [facility] , has home oxygen from Drucie Opitz Rn,MHA,BSN [phone]

## 2015-01-21 NOTE — Progress Notes (Deleted)
Patient is active with [facility] as prior to admission and has home oxygen through [place]. Mindi S[facility] [phone]

## 2015-01-22 LAB — BASIC METABOLIC PANEL
Anion gap: 8 (ref 5–15)
BUN: 52 mg/dL — AB (ref 6–20)
CO2: 25 mmol/L (ref 22–32)
CREATININE: 1.95 mg/dL — AB (ref 0.61–1.24)
Calcium: 8.5 mg/dL — ABNORMAL LOW (ref 8.9–10.3)
Chloride: 102 mmol/L (ref 101–111)
GFR calc Af Amer: 39 mL/min — ABNORMAL LOW (ref >60)
GFR, EST NON AFRICAN AMERICAN: 33 mL/min — AB (ref >60)
GLUCOSE: 101 mg/dL — AB (ref 65–99)
Potassium: 4.3 mmol/L (ref 3.5–5.1)
Sodium: 135 mmol/L (ref 135–145)

## 2015-01-22 NOTE — Progress Notes (Signed)
Utilization review completed. Kenichi Cassada, RN, BSN. 

## 2015-01-22 NOTE — Plan of Care (Signed)
Problem: Education: Goal: Ability to demonstrate managment of disease process will improve Outcome: Completed/Met Date Met:  01/22/15 Stated he has [facility] with Heart Failure and weighs himself daily, adds no salt to food, knows to only drink 5 cups of liquid/day  Problem: Health Behavior/Discharge Planning: Goal: Ability to manage health-related needs will improve for discharge Outcome: Completed/Met Date Met:  01/22/15 Has his own care and son drives him to doctor appointments.  Stated no problems purchasing medications and stated some come from [facility] and some from [facility]  Problem: Pain Managment: Goal: General experience of comfort will improve Outcome: Completed/Met Date Met:  01/22/15 Patient instructed to call if pain starts - denies pain at present
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
## 2015-01-22 NOTE — Care Management Important Message (Signed)
Important Message  Patient Details  Name: [name] MRN: [id number] Date of Birth: May 01, 1945   Medicare Important Message Given:  Yes    Nathen May 01/22/2015, 3:22 PM

## 2015-01-22 NOTE — Progress Notes (Signed)
Confirmed with Dr. Terrence Dupont that it is ok to administer morning dose of Lasix 40 mg IV.  B/P of 96/55, BUN of 52, & Crea of 1.95 reported.  Order entered for IV consult to access Port-a-cath to give IV med.

## 2015-01-23 MED ORDER — LEVALBUTEROL HCL 1.25 MG/0.5ML IN NEBU
1.2500 mg | INHALATION_SOLUTION | Freq: Three times a day (TID) | RESPIRATORY_TRACT | Status: DC
  Administered 2015-01-23 – 2015-01-24 (×2): 1.25 mg via RESPIRATORY_TRACT
  Filled 2015-01-23 (×2): qty 0.5

## 2015-01-23 MED ORDER — METOLAZONE 2.5 MG PO TABS
2.5000 mg | ORAL_TABLET | Freq: Every day | ORAL | Status: DC
  Administered 2015-01-23 – 2015-01-24 (×2): 2.5 mg via ORAL
  Filled 2015-01-23 (×2): qty 1
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
## 2015-01-24 MED ORDER — HEPARIN SOD (PORK) LOCK FLUSH 100 UNIT/ML IV SOLN
500.0000 [IU] | INTRAVENOUS | Status: AC | PRN
  Administered 2015-01-24: 500 [IU]

## 2015-01-24 MED ORDER — LEVALBUTEROL HCL 1.25 MG/0.5ML IN NEBU: 1.2500 mg | INHALATION_SOLUTION | Freq: Three times a day (TID) | RESPIRATORY_TRACT | Status: DC

## 2015-01-24 MED ORDER — METOLAZONE 2.5 MG PO TABS: 2.5000 mg | ORAL_TABLET | Freq: Every day | ORAL | Status: DC

## 2015-01-24 NOTE — Progress Notes (Signed)
Pt refused to be in bed with foot of bed elevated. C/O of SOB and wanted to be back in chair.

## 2015-01-24 NOTE — Discharge Summary (Signed)
Discharge summary dictated on 01/24/2015 dictation number is [phone]
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
## 2015-01-24 NOTE — Progress Notes (Signed)
Dr. Terrence Dupont called to notify me that patient can go home. Told him that BP was low at 84/53. He has lasix scheduled IV now. He said ok to hold dose now.

## 2015-01-24 NOTE — Discharge Instructions (Signed)
Heart Failure °Heart failure is a condition in which the heart has trouble pumping blood. This means your heart does not pump blood efficiently for your body to work well. In some cases of heart failure, fluid may back up into your lungs or you may have swelling (edema) in your lower legs. Heart failure is usually a long-term (chronic) condition. It is important for you to take good care of yourself and follow your health care provider's treatment plan. °CAUSES  °Some health conditions can cause heart failure. Those health conditions include: °· High blood pressure (hypertension). Hypertension causes the heart muscle to work harder than normal. When pressure in the blood vessels is high, the heart needs to pump (contract) with more force in order to circulate blood throughout the body. High blood pressure eventually causes the heart to become stiff and weak. °· Coronary artery disease (CAD). CAD is the buildup of cholesterol and fat (plaque) in the arteries of the heart. The blockage in the arteries deprives the heart muscle of oxygen and blood. This can cause chest pain and may lead to a heart attack. High blood pressure can also contribute to CAD. °· Heart attack (myocardial infarction). A heart attack occurs when one or more arteries in the heart become blocked. The loss of oxygen damages the muscle tissue of the heart. When this happens, part of the heart muscle dies. The injured tissue does not contract as well and weakens the heart's ability to pump blood. °· Abnormal heart valves. When the heart valves do not open and close properly, it can cause heart failure. This makes the heart muscle pump harder to keep the blood flowing. °· Heart muscle disease (cardiomyopathy or myocarditis). Heart muscle disease is damage to the heart muscle from a variety of causes. These can include drug or alcohol abuse, infections, or unknown reasons. These can increase the risk of heart failure. °· Lung disease. Lung disease  makes the heart work harder because the lungs do not work properly. This can cause a strain on the heart, leading it to fail. °· Diabetes. Diabetes increases the risk of heart failure. High blood sugar contributes to high fat (lipid) levels in the blood. Diabetes can also cause slow damage to tiny blood vessels that carry important nutrients to the heart muscle. When the heart does not get enough oxygen and food, it can cause the heart to become weak and stiff. This leads to a heart that does not contract efficiently. °· Other conditions can contribute to heart failure. These include abnormal heart rhythms, thyroid problems, and low blood counts (anemia). °Certain unhealthy behaviors can increase the risk of heart failure, including: °· Being overweight. °· Smoking or chewing tobacco. °· Eating foods high in fat and cholesterol. °· Abusing illicit drugs or alcohol. °· Lacking physical activity. °SYMPTOMS  °Heart failure symptoms may vary and can be hard to detect. Symptoms may include: °· Shortness of breath with activity, such as climbing stairs. °· Persistent cough. °· Swelling of the feet, ankles, legs, or abdomen. °· Unexplained weight gain. °· Difficulty breathing when lying flat (orthopnea). °· Waking from sleep because of the need to sit up and get more air. °· Rapid heartbeat. °· Fatigue and loss of energy. °· Feeling light-headed, dizzy, or close to fainting. °· Loss of appetite. °· Nausea. °· Increased urination during the night (nocturia). °DIAGNOSIS  °A diagnosis of heart failure is based on your history, symptoms, physical examination, and diagnostic tests. Diagnostic tests for heart failure may include: °·   Echocardiography. °· Electrocardiography. °· Chest X-ray. °· Blood tests. °· Exercise stress test. °· Cardiac angiography. °· Radionuclide scans. °TREATMENT  °Treatment is aimed at managing the symptoms of heart failure. Medicines, behavioral changes, or surgical intervention may be necessary to  treat heart failure. °· Medicines to help treat heart failure may include: °¨ Angiotensin-converting enzyme (ACE) inhibitors. This type of medicine blocks the effects of a blood protein called angiotensin-converting enzyme. ACE inhibitors relax (dilate) the blood vessels and help lower blood pressure. °¨ Angiotensin receptor blockers (ARBs). This type of medicine blocks the actions of a blood protein called angiotensin. Angiotensin receptor blockers dilate the blood vessels and help lower blood pressure. °¨ Water pills (diuretics). Diuretics cause the kidneys to remove salt and water from the blood. The extra fluid is removed through urination. This loss of extra fluid lowers the volume of blood the heart pumps. °¨ Beta blockers. These prevent the heart from beating too fast and improve heart muscle strength. °¨ Digitalis. This increases the force of the heartbeat. °· Healthy behavior changes include: °¨ Obtaining and maintaining a healthy weight. °¨ Stopping smoking or chewing tobacco. °¨ Eating heart-healthy foods. °¨ Limiting or avoiding alcohol. °¨ Stopping illicit drug use. °¨ Physical activity as directed by your health care provider. °· Surgical treatment for heart failure may include: °¨ A procedure to open blocked arteries, repair damaged heart valves, or remove damaged heart muscle tissue. °¨ A pacemaker to improve heart muscle function and control certain abnormal heart rhythms. °¨ An internal cardioverter defibrillator to treat certain serious abnormal heart rhythms. °¨ A left ventricular assist device (LVAD) to assist the pumping ability of the heart. °HOME CARE INSTRUCTIONS  °· Take medicines only as directed by your health care provider. Medicines are important in reducing the workload of your heart, slowing the progression of heart failure, and improving your symptoms. °¨ Do not stop taking your medicine unless directed by your health care provider. °¨ Do not skip any dose of medicine. °¨ Refill your  prescriptions before you run out of medicine. Your medicines are needed every day. °· Engage in moderate physical activity if directed by your health care provider. Moderate physical activity can benefit some people. The elderly and people with severe heart failure should consult with a health care provider for physical activity recommendations. °· Eat heart-healthy foods. Food choices should be free of trans fat and low in saturated fat, cholesterol, and salt (sodium). Healthy choices include fresh or frozen fruits and vegetables, fish, lean meats, legumes, fat-free or low-fat dairy products, and whole grain or high fiber foods. Talk to a dietitian to learn more about heart-healthy foods. °· Limit sodium if directed by your health care provider. Sodium restriction may reduce symptoms of heart failure in some people. Talk to a dietitian to learn more about heart-healthy seasonings. °· Use healthy cooking methods. Healthy cooking methods include roasting, grilling, broiling, baking, poaching, steaming, or stir-frying. Talk to a dietitian to learn more about healthy cooking methods. °· Limit fluids if directed by your health care provider. Fluid restriction may reduce symptoms of heart failure in some people. °· Weigh yourself every day. Daily weights are important in the early recognition of excess fluid. You should weigh yourself every morning after you urinate and before you eat breakfast. Wear the same amount of clothing each time you weigh yourself. Record your daily weight. Provide your health care provider with your weight record. °· Monitor and record your blood pressure if directed by your health care   provider.  Check your pulse if directed by your health care provider.  Lose weight if directed by your health care provider. Weight loss may reduce symptoms of heart failure in some people.  Stop smoking or chewing tobacco. Nicotine makes your heart work harder by causing your blood vessels to constrict.  Do not use nicotine gum or patches before talking to your health care provider.  Keep all follow-up visits as directed by your health care provider. This is important.  Limit alcohol intake to no more than 1 drink per day for nonpregnant women and 2 drinks per day for men. One drink equals 12 ounces of beer, 5 ounces of wine, or 1 ounces of hard liquor. Drinking more than that is harmful to your heart. Tell your health care provider if you drink alcohol several times a week. Talk with your health care provider about whether alcohol is safe for you. If your heart has already been damaged by alcohol or you have severe heart failure, drinking alcohol should be stopped completely.  Stop illicit drug use.  Stay up-to-date with immunizations. It is especially important to prevent respiratory infections through current pneumococcal and influenza immunizations.  Manage other health conditions such as hypertension, diabetes, thyroid disease, or abnormal heart rhythms as directed by your health care provider.  Learn to manage stress.  Plan rest periods when fatigued.  Learn strategies to manage high temperatures. If the weather is extremely hot:  Avoid vigorous physical activity.  Use air conditioning or fans or seek a cooler location.  Avoid caffeine and alcohol.  Wear loose-fitting, lightweight, and light-colored clothing.  Learn strategies to manage cold temperatures. If the weather is extremely cold:  Avoid vigorous physical activity.  Layer clothes.  Wear mittens or gloves, a hat, and a scarf when going outside.  Avoid alcohol.  Obtain ongoing education and support as needed.  Participate in or seek rehabilitation as needed to maintain or improve independence and quality of life. SEEK MEDICAL CARE IF:   You have a rapid weight gain.  You have increasing shortness of breath that is unusual for you.  You are unable to participate in your usual physical activities.  You tire  easily.  You cough more than normal, especially with physical activity.  You have any or more swelling in areas such as your hands, feet, ankles, or abdomen.  You are unable to sleep because it is hard to breathe.  You feel like your heart is beating fast (palpitations).  You become dizzy or light-headed upon standing up. SEEK IMMEDIATE MEDICAL CARE IF:   You have difficulty breathing.  There is a change in mental status such as decreased alertness or difficulty with concentration.  You have a pain or discomfort in your chest.  You have an episode of fainting (syncope). MAKE SURE YOU:   Understand these instructions.  Will watch your condition.  Will get help right away if you are not doing well or get worse.   This information is not intended to replace advice given to you by your health care provider. Make sure you discuss any questions you have with your health care provider.   Document Released: 01/16/2005 Document Revised: 06/02/2014 Document Reviewed: 02/16/2012 Elsevier Interactive Patient Education 2016 [employer] Chronic Respiratory Failure Respiratory failure is when your lungs are not working well and your breathing (respiratory) system fails. When respiratory failure occurs, it is difficult for your lungs to get enough oxygen or get rid of carbon dioxide or both. Respiratory failure  can be life threatening.  Respiratory failure can be acute or chronic. Acute respiratory failure is sudden, severe, and requires emergency medical treatment. Chronic respiratory failure is less severe, happens over time, and requires ongoing treatment.  CAUSES  Any problem affecting the heart or lungs can cause respiratory failure. Some of these causes may be:  Chronic bronchitis and emphysema (COPD).  Blood clot going to the lung (pulmonary embolism).  Having water in the lungs caused by heart failure, lung injury, or infection (pulmonary edema).  Collapsed lung  (pneumothorax).  Pneumonia.  Pulmonary fibrosis.  Obesity.  Asthma.  Heart failure.  Any type of trauma to the chest that can make breathing difficult.  Nerve or muscle diseases making chest movements difficult. SYMPTOMS  Signs and symptoms of chronic respiratory failure include:  Shortness of breath (dyspnea) with or without activity.  Rapid, fast breathing (tachypnea).  Wheezing.  Fast heart rate.  Bluish color to the fingernail or toenail beds.  Confusion or drowsiness or both. DIAGNOSIS  Initial diagnosis requires a thorough history and a physical exam by your health care provider. Additional tests may include:  Chest X-ray.  CT scan of your lungs.  Ultrasound to check for blood clots.  Blood tests, such as an arterial blood gas test (ABG). This is a blood test that looks at the oxygen and carbon dioxide levels in your arterial blood.  Your vital signs will be taken. This includes your respiratory rate (how many times a minute you are breathing), oxygen saturation (this measures the oxygen level in your blood), heart rate, and blood pressure. These numbers help your health care provider determine the next steps.  Electrocardiogram. TREATMENT  Treatment of chronic respiratory failure depends on the cause of the respiratory failure. Treatment can include the following:  Oxygen. Oxygen can be delivered through the following:  Nasal cannula. This is small tubing that goes in your nose to give you oxygen.  Face mask. A face mask covers your nose and mouth to give you oxygen.  Medicine. Different medicines can be given to help with breathing. These can include:  Nebulizers. Nebulizers deliver medicines to open the air passages (bronchodilators). These medicines help to open or relax the airways in the lungs so you can breathe better. They can also help loosen mucus from your lungs.  Diuretics. Diuretic medicines can help you breathe better by getting rid of extra  fluid in your body.  Steroids. Steroid medicines can help decrease inflammation in your lungs.  Chest tube. If you have a collapsed lung (pneumothorax), a chest tube is placed to help reinflate the lung.  Noninvasive positive pressure ventilation (NPPV). This is a tight-fitting mask that goes over your nose and mouth. The mask has tubing that is attached to a machine. The machine blows air into the tubing, which helps to keep the tiny air sacs (alveoli) in your lungs open. This machine allows you to breathe on your own.  Ventilator. A ventilator is a breathing machine. When on a ventilator, a breathing tube is put into the lungs. A ventilator is used when you can no longer breathe well enough on your own. You may have low oxygen levels or high carbon dioxide (CO2) levels in your blood. When you are on a ventilator, sedation and pain medicines are given to make you sleep so your lungs can heal. HOME CARE INSTRUCTIONS  Follow your health care provider's directions about medicines and respiratory therapy.  Quit smoking if you smoke. SEEK MEDICAL CARE IF:  You have increasing shortness of breath and are less functional than you have been.  You have increased sputum, wheezing, coughing, or loss of energy.  You are on oxygen and are requiring more. SEEK IMMEDIATE MEDICAL CARE IF:  Your shortness of breath is significantly worse.  You are unable to say more than a few words without having to catch your breath.  You are much less functional. MAKE SURE YOU:  Understand these instructions.  Will watch your condition.  Will get help right away if you are not doing well or get worse.   This information is not intended to replace advice given to you by your health care provider. Make sure you discuss any questions you have with your health care provider.   Document Released: 01/16/2005 Document Revised: 10/07/2014 Document Reviewed: 11/14/2012 Elsevier Interactive Patient Education [employer]

## 2015-01-24 NOTE — Progress Notes (Signed)
BP checked manually was  low. MD made aware; pt transferred from chair to bed with foot of bed elevated.

## 2015-01-26 NOTE — Telephone Encounter (Signed)
He can be treated on January 5 as previously scheduled.

## 2015-01-26 NOTE — Progress Notes (Signed)
Patient discharged home prior to being seen by CM for arrangement for [facility] services. TCT patient to offer [place] choices, patient requested [facility]. Stephanie with [facility] called for arrangements. Mindi Slicker [facility] [phone]

## 2015-01-26 NOTE — Telephone Encounter (Signed)
"  I was discharged yesterday.  Missed my Chemotherapy appointment 01-21-2015 and need to reschedule. Please call."  q 14-day Nivolumab.  Cycle 4 received 01-07-2015.  Next scheduled treatment 02-04-2015.

## 2015-01-27 NOTE — Telephone Encounter (Signed)
Called, reached son and informed him of no appointment changes at this time.  Will continue treatment and F/U as previously scheduled.

## 2015-01-28 ENCOUNTER — Ambulatory Visit: Payer: Self-pay

## 2015-02-04 ENCOUNTER — Ambulatory Visit (HOSPITAL_BASED_OUTPATIENT_CLINIC_OR_DEPARTMENT_OTHER): Payer: Commercial Managed Care - HMO | Admitting: Internal Medicine

## 2015-02-04 ENCOUNTER — Encounter: Payer: Self-pay | Admitting: Internal Medicine

## 2015-02-04 ENCOUNTER — Telehealth: Payer: Self-pay | Admitting: Internal Medicine

## 2015-02-04 ENCOUNTER — Ambulatory Visit (HOSPITAL_BASED_OUTPATIENT_CLINIC_OR_DEPARTMENT_OTHER): Payer: Commercial Managed Care - HMO

## 2015-02-04 ENCOUNTER — Other Ambulatory Visit (HOSPITAL_BASED_OUTPATIENT_CLINIC_OR_DEPARTMENT_OTHER): Payer: Commercial Managed Care - HMO

## 2015-02-04 VITALS — BP 107/54 | HR 76 | Temp 97.6°F | Resp 18 | Ht 69.0 in | Wt 256.9 lb

## 2015-02-04 DIAGNOSIS — Z5112 Encounter for antineoplastic immunotherapy: Secondary | ICD-10-CM | POA: Diagnosis not present

## 2015-02-04 DIAGNOSIS — C3411 Malignant neoplasm of upper lobe, right bronchus or lung: Secondary | ICD-10-CM

## 2015-02-04 DIAGNOSIS — C7951 Secondary malignant neoplasm of bone: Secondary | ICD-10-CM

## 2015-02-04 DIAGNOSIS — C3432 Malignant neoplasm of lower lobe, left bronchus or lung: Secondary | ICD-10-CM

## 2015-02-04 DIAGNOSIS — Z79899 Other long term (current) drug therapy: Secondary | ICD-10-CM | POA: Diagnosis not present

## 2015-02-04 DIAGNOSIS — R05 Cough: Secondary | ICD-10-CM | POA: Diagnosis not present

## 2015-02-04 DIAGNOSIS — J441 Chronic obstructive pulmonary disease with (acute) exacerbation: Secondary | ICD-10-CM

## 2015-02-04 DIAGNOSIS — I5031 Acute diastolic (congestive) heart failure: Secondary | ICD-10-CM

## 2015-02-04 DIAGNOSIS — Z66 Do not resuscitate: Secondary | ICD-10-CM

## 2015-02-04 LAB — COMPREHENSIVE METABOLIC PANEL
ALBUMIN: 3 g/dL — AB (ref 3.5–5.0)
ALK PHOS: 87 U/L (ref 40–150)
ALT: 11 U/L (ref 0–55)
ANION GAP: 9 meq/L (ref 3–11)
AST: 23 U/L (ref 5–34)
BILIRUBIN TOTAL: 0.81 mg/dL (ref 0.20–1.20)
BUN: 57.5 mg/dL — ABNORMAL HIGH (ref 7.0–26.0)
CALCIUM: 8.8 mg/dL (ref 8.4–10.4)
CO2: 25 mEq/L (ref 22–29)
Chloride: 104 mEq/L (ref 98–109)
Creatinine: 2.1 mg/dL — ABNORMAL HIGH (ref 0.7–1.3)
EGFR: 37 mL/min/{1.73_m2} — AB (ref >90)
Glucose: 107 mg/dl (ref 70–140)
POTASSIUM: 4.5 meq/L (ref 3.5–5.1)
Sodium: 137 mEq/L (ref 136–145)
TOTAL PROTEIN: 6.8 g/dL (ref 6.4–8.3)

## 2015-02-04 LAB — CBC WITH DIFFERENTIAL/PLATELET
BASO%: 0 % (ref 0.0–2.0)
BASOS ABS: 0 10*3/uL (ref 0.0–0.1)
EOS ABS: 0 10*3/uL (ref 0.0–0.5)
EOS%: 0.5 % (ref 0.0–7.0)
HCT: 40.3 % (ref 38.4–49.9)
HEMOGLOBIN: 12.1 g/dL — AB (ref 13.0–17.1)
LYMPH%: 9.3 % — ABNORMAL LOW (ref 14.0–49.0)
MCH: 28.5 pg (ref 27.2–33.4)
MCHC: 30 g/dL — ABNORMAL LOW (ref 32.0–36.0)
MCV: 94.8 fL (ref 79.3–98.0)
MONO#: 0.8 10*3/uL (ref 0.1–0.9)
MONO%: 13 % (ref 0.0–14.0)
NEUT%: 77.2 % — ABNORMAL HIGH (ref 39.0–75.0)
NEUTROS ABS: 4.6 10*3/uL (ref 1.5–6.5)
PLATELETS: 230 10*3/uL (ref 140–400)
RBC: 4.25 10*6/uL (ref 4.20–5.82)
RDW: 19 % — AB (ref 11.0–14.6)
WBC: 5.9 10*3/uL (ref 4.0–10.3)
lymph#: 0.6 10*3/uL — ABNORMAL LOW (ref 0.9–3.3)

## 2015-02-04 LAB — TSH: TSH: 2.111 m(IU)/L (ref 0.320–4.118)

## 2015-02-04 MED ORDER — SODIUM CHLORIDE 0.9 % IV SOLN
240.0000 mg | Freq: Once | INTRAVENOUS | Status: AC
  Administered 2015-02-04: 240 mg via INTRAVENOUS
  Filled 2015-02-04: qty 20

## 2015-02-04 MED ORDER — SODIUM CHLORIDE 0.9 % IV SOLN
Freq: Once | INTRAVENOUS | Status: AC
  Administered 2015-02-04: 10:00:00 via INTRAVENOUS

## 2015-02-04 MED ORDER — HYDROCODONE-HOMATROPINE 5-1.5 MG/5ML PO SYRP
5.0000 mL | ORAL_SOLUTION | Freq: Four times a day (QID) | ORAL | Status: DC | PRN
Start: 2015-02-04 — End: 2015-02-06

## 2015-02-04 NOTE — Progress Notes (Signed)
Per Julien Nordmann it is okay to treat pt today with today's lab results.
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
## 2015-02-04 NOTE — Patient Instructions (Signed)
[facility] Discharge Instructions for Patients Receiving Chemotherapy  Today you received the following chemotherapy agents:  Nivolumab.  To help prevent nausea and vomiting after your treatment, we encourage you to take your nausea medication as directed.   If you develop nausea and vomiting that is not controlled by your nausea medication, call the clinic.   BELOW ARE SYMPTOMS THAT SHOULD BE REPORTED IMMEDIATELY:  *FEVER GREATER THAN 100.5 F  *CHILLS WITH OR WITHOUT FEVER  NAUSEA AND VOMITING THAT IS NOT CONTROLLED WITH YOUR NAUSEA MEDICATION  *UNUSUAL SHORTNESS OF BREATH  *UNUSUAL BRUISING OR BLEEDING  TENDERNESS IN MOUTH AND THROAT WITH OR WITHOUT PRESENCE OF ULCERS  *URINARY PROBLEMS  *BOWEL PROBLEMS  UNUSUAL RASH Items with * indicate a potential emergency and should be followed up as soon as possible.  Feel free to call the clinic you have any questions or concerns. The clinic phone number is [phone].  Please show the CHEMO ALERT CARD at check-in to the Emergency Department and triage nurse.   

## 2015-02-04 NOTE — Telephone Encounter (Signed)
per pof to sch pt appt-sent Dr Julien Nordmann staff message to adv no MD avaiable for 1/19-will call pt and get MW to sch after reply

## 2015-02-04 NOTE — Progress Notes (Signed)
Reviewed recent chest xray reports for PAC tip placement. Reports from November and December both report that tip is in the inominant vein. Informed Diane, RN with Dr. Julien Nordmann. Will not use PAC at this time -peripheral IV to be used instead while waiting to have PAC placement investigated.  Pt. Made aware and is agreeable to peripheral IV today. He understands that we will be in contact with him after radiology reviews film abnd if dye study will be needed.

## 2015-02-05 NOTE — Progress Notes (Addendum)
Note to [facility] re:  port tip placement and further evaluation by Dr Cyndia Bent or IR.

## 2015-02-06 ENCOUNTER — Encounter (HOSPITAL_COMMUNITY): Payer: Self-pay | Admitting: Emergency Medicine

## 2015-02-06 ENCOUNTER — Emergency Department (HOSPITAL_COMMUNITY): Payer: Commercial Managed Care - HMO

## 2015-02-06 ENCOUNTER — Other Ambulatory Visit: Payer: Self-pay

## 2015-02-06 ENCOUNTER — Inpatient Hospital Stay (HOSPITAL_COMMUNITY)
Admission: EM | Admit: 2015-02-06 | Discharge: 2015-02-12 | DRG: 189 | Disposition: A | Discharge status: Home / Self Care | Payer: Commercial Managed Care - HMO | Attending: Cardiology | Admitting: Cardiology

## 2015-02-06 DIAGNOSIS — Z902 Acquired absence of lung [part of]: Secondary | ICD-10-CM | POA: Diagnosis not present

## 2015-02-06 DIAGNOSIS — J8410 Pulmonary fibrosis, unspecified: Secondary | ICD-10-CM | POA: Diagnosis present

## 2015-02-06 DIAGNOSIS — R0602 Shortness of breath: Secondary | ICD-10-CM | POA: Insufficient documentation

## 2015-02-06 DIAGNOSIS — Z9221 Personal history of antineoplastic chemotherapy: Secondary | ICD-10-CM | POA: Diagnosis not present

## 2015-02-06 DIAGNOSIS — R0600 Dyspnea, unspecified: Secondary | ICD-10-CM | POA: Diagnosis not present

## 2015-02-06 DIAGNOSIS — C7951 Secondary malignant neoplasm of bone: Secondary | ICD-10-CM | POA: Diagnosis present

## 2015-02-06 DIAGNOSIS — J9621 Acute and chronic respiratory failure with hypoxia: Secondary | ICD-10-CM | POA: Diagnosis present

## 2015-02-06 DIAGNOSIS — M1990 Unspecified osteoarthritis, unspecified site: Secondary | ICD-10-CM | POA: Diagnosis present

## 2015-02-06 DIAGNOSIS — J449 Chronic obstructive pulmonary disease, unspecified: Secondary | ICD-10-CM | POA: Diagnosis present

## 2015-02-06 DIAGNOSIS — N184 Chronic kidney disease, stage 4 (severe): Secondary | ICD-10-CM | POA: Diagnosis present

## 2015-02-06 DIAGNOSIS — Z9889 Other specified postprocedural states: Secondary | ICD-10-CM | POA: Diagnosis not present

## 2015-02-06 DIAGNOSIS — I272 Other secondary pulmonary hypertension: Secondary | ICD-10-CM | POA: Diagnosis present

## 2015-02-06 DIAGNOSIS — R579 Shock, unspecified: Secondary | ICD-10-CM | POA: Diagnosis present

## 2015-02-06 DIAGNOSIS — I872 Venous insufficiency (chronic) (peripheral): Secondary | ICD-10-CM | POA: Diagnosis present

## 2015-02-06 DIAGNOSIS — Z9981 Dependence on supplemental oxygen: Secondary | ICD-10-CM | POA: Diagnosis not present

## 2015-02-06 DIAGNOSIS — N179 Acute kidney failure, unspecified: Secondary | ICD-10-CM | POA: Diagnosis present

## 2015-02-06 DIAGNOSIS — Z8249 Family history of ischemic heart disease and other diseases of the circulatory system: Secondary | ICD-10-CM

## 2015-02-06 DIAGNOSIS — I5022 Chronic systolic (congestive) heart failure: Secondary | ICD-10-CM | POA: Diagnosis present

## 2015-02-06 DIAGNOSIS — Z923 Personal history of irradiation: Secondary | ICD-10-CM | POA: Diagnosis not present

## 2015-02-06 DIAGNOSIS — E861 Hypovolemia: Secondary | ICD-10-CM | POA: Diagnosis present

## 2015-02-06 DIAGNOSIS — Z87891 Personal history of nicotine dependence: Secondary | ICD-10-CM

## 2015-02-06 DIAGNOSIS — C3400 Malignant neoplasm of unspecified main bronchus: Secondary | ICD-10-CM | POA: Diagnosis not present

## 2015-02-06 DIAGNOSIS — I130 Hypertensive heart and chronic kidney disease with heart failure and stage 1 through stage 4 chronic kidney disease, or unspecified chronic kidney disease: Secondary | ICD-10-CM | POA: Diagnosis present

## 2015-02-06 DIAGNOSIS — E876 Hypokalemia: Secondary | ICD-10-CM | POA: Diagnosis present

## 2015-02-06 DIAGNOSIS — I5023 Acute on chronic systolic (congestive) heart failure: Secondary | ICD-10-CM | POA: Diagnosis present

## 2015-02-06 DIAGNOSIS — I509 Heart failure, unspecified: Secondary | ICD-10-CM | POA: Insufficient documentation

## 2015-02-06 DIAGNOSIS — Z801 Family history of malignant neoplasm of trachea, bronchus and lung: Secondary | ICD-10-CM

## 2015-02-06 DIAGNOSIS — Z825 Family history of asthma and other chronic lower respiratory diseases: Secondary | ICD-10-CM | POA: Diagnosis not present

## 2015-02-06 DIAGNOSIS — R0682 Tachypnea, not elsewhere classified: Secondary | ICD-10-CM | POA: Diagnosis present

## 2015-02-06 DIAGNOSIS — I071 Rheumatic tricuspid insufficiency: Secondary | ICD-10-CM | POA: Diagnosis present

## 2015-02-06 DIAGNOSIS — I5030 Unspecified diastolic (congestive) heart failure: Secondary | ICD-10-CM

## 2015-02-06 DIAGNOSIS — D638 Anemia in other chronic diseases classified elsewhere: Secondary | ICD-10-CM | POA: Diagnosis present

## 2015-02-06 DIAGNOSIS — R011 Cardiac murmur, unspecified: Secondary | ICD-10-CM | POA: Diagnosis present

## 2015-02-06 DIAGNOSIS — I38 Endocarditis, valve unspecified: Secondary | ICD-10-CM | POA: Diagnosis not present

## 2015-02-06 DIAGNOSIS — G4733 Obstructive sleep apnea (adult) (pediatric): Secondary | ICD-10-CM | POA: Diagnosis present

## 2015-02-06 DIAGNOSIS — Z87442 Personal history of urinary calculi: Secondary | ICD-10-CM | POA: Diagnosis not present

## 2015-02-06 DIAGNOSIS — C3490 Malignant neoplasm of unspecified part of unspecified bronchus or lung: Secondary | ICD-10-CM | POA: Diagnosis present

## 2015-02-06 DIAGNOSIS — G4700 Insomnia, unspecified: Secondary | ICD-10-CM | POA: Diagnosis present

## 2015-02-06 DIAGNOSIS — E2740 Unspecified adrenocortical insufficiency: Secondary | ICD-10-CM | POA: Diagnosis present

## 2015-02-06 DIAGNOSIS — Z800 Family history of malignant neoplasm of digestive organs: Secondary | ICD-10-CM | POA: Diagnosis not present

## 2015-02-06 LAB — COMPREHENSIVE METABOLIC PANEL
ALT: 12 U/L — ABNORMAL LOW (ref 17–63)
ANION GAP: 11 (ref 5–15)
AST: 27 U/L (ref 15–41)
Albumin: 2.8 g/dL — ABNORMAL LOW (ref 3.5–5.0)
Alkaline Phosphatase: 71 U/L (ref 38–126)
BILIRUBIN TOTAL: 1.3 mg/dL — AB (ref 0.3–1.2)
BUN: 58 mg/dL — ABNORMAL HIGH (ref 6–20)
CHLORIDE: 100 mmol/L — AB (ref 101–111)
CO2: 26 mmol/L (ref 22–32)
Calcium: 8.8 mg/dL — ABNORMAL LOW (ref 8.9–10.3)
Creatinine, Ser: 2.3 mg/dL — ABNORMAL HIGH (ref 0.61–1.24)
GFR, EST AFRICAN AMERICAN: 32 mL/min — AB (ref >60)
GFR, EST NON AFRICAN AMERICAN: 27 mL/min — AB (ref >60)
Glucose, Bld: 106 mg/dL — ABNORMAL HIGH (ref 65–99)
POTASSIUM: 4.7 mmol/L (ref 3.5–5.1)
Sodium: 137 mmol/L (ref 135–145)
TOTAL PROTEIN: 6.2 g/dL — AB (ref 6.5–8.1)

## 2015-02-06 LAB — CBC WITH DIFFERENTIAL/PLATELET
BASOS ABS: 0 10*3/uL (ref 0.0–0.1)
BASOS PCT: 0 %
Eosinophils Absolute: 0 10*3/uL (ref 0.0–0.7)
Eosinophils Relative: 0 %
HEMATOCRIT: 39.6 % (ref 39.0–52.0)
HEMOGLOBIN: 12 g/dL — AB (ref 13.0–17.0)
Lymphocytes Relative: 24 %
Lymphs Abs: 1.3 10*3/uL (ref 0.7–4.0)
MCH: 28.3 pg (ref 26.0–34.0)
MCHC: 30.3 g/dL (ref 30.0–36.0)
MCV: 93.4 fL (ref 78.0–100.0)
MONOS PCT: 10 %
Monocytes Absolute: 0.5 10*3/uL (ref 0.1–1.0)
NEUTROS ABS: 3.6 10*3/uL (ref 1.7–7.7)
NEUTROS PCT: 66 %
Platelets: 200 10*3/uL (ref 150–400)
RBC: 4.24 MIL/uL (ref 4.22–5.81)
RDW: 19.3 % — AB (ref 11.5–15.5)
WBC: 5.4 10*3/uL (ref 4.0–10.5)

## 2015-02-06 LAB — TROPONIN I: Troponin I: 0.04 ng/mL — ABNORMAL HIGH (ref <0.031)

## 2015-02-06 LAB — I-STAT TROPONIN, ED
TROPONIN I, POC: 0.03 ng/mL (ref 0.00–0.08)
Troponin i, poc: 0.04 ng/mL (ref 0.00–0.08)

## 2015-02-06 LAB — CBC
HEMATOCRIT: 40.2 % (ref 39.0–52.0)
HEMOGLOBIN: 12.2 g/dL — AB (ref 13.0–17.0)
MCH: 28.5 pg (ref 26.0–34.0)
MCHC: 30.3 g/dL (ref 30.0–36.0)
MCV: 93.9 fL (ref 78.0–100.0)
Platelets: 236 10*3/uL (ref 150–400)
RBC: 4.28 MIL/uL (ref 4.22–5.81)
RDW: 19.2 % — AB (ref 11.5–15.5)
WBC: 5.9 10*3/uL (ref 4.0–10.5)

## 2015-02-06 LAB — DIGOXIN LEVEL: DIGOXIN LVL: 0.5 ng/mL — AB (ref 0.8–2.0)

## 2015-02-06 LAB — BASIC METABOLIC PANEL
ANION GAP: 12 (ref 5–15)
BUN: 59 mg/dL — AB (ref 6–20)
CHLORIDE: 99 mmol/L — AB (ref 101–111)
CO2: 23 mmol/L (ref 22–32)
Calcium: 8.8 mg/dL — ABNORMAL LOW (ref 8.9–10.3)
Creatinine, Ser: 2.36 mg/dL — ABNORMAL HIGH (ref 0.61–1.24)
GFR calc Af Amer: 31 mL/min — ABNORMAL LOW (ref >60)
GFR calc non Af Amer: 26 mL/min — ABNORMAL LOW (ref >60)
GLUCOSE: 110 mg/dL — AB (ref 65–99)
POTASSIUM: 4.8 mmol/L (ref 3.5–5.1)
SODIUM: 134 mmol/L — AB (ref 135–145)

## 2015-02-06 LAB — BRAIN NATRIURETIC PEPTIDE: B Natriuretic Peptide: 1585.7 pg/mL — ABNORMAL HIGH (ref 0.0–100.0)

## 2015-02-06 LAB — MRSA PCR SCREENING: MRSA BY PCR: NEGATIVE

## 2015-02-06 MED ORDER — DIGOXIN 0.0625 MG HALF TABLET: 62.5000 ug | ORAL_TABLET | Freq: Every day | ORAL | Status: DC

## 2015-02-06 MED ORDER — ACETAZOLAMIDE 250 MG PO TABS
250.0000 mg | ORAL_TABLET | Freq: Every day | ORAL | Status: DC
  Administered 2015-02-07: 250 mg via ORAL
  Filled 2015-02-06: qty 1

## 2015-02-06 MED ORDER — ONDANSETRON HCL 4 MG/2ML IJ SOLN: 4.0000 mg | Freq: Four times a day (QID) | INTRAMUSCULAR | Status: DC | PRN

## 2015-02-06 MED ORDER — ASPIRIN EC 81 MG PO TBEC
81.0000 mg | DELAYED_RELEASE_TABLET | Freq: Every day | ORAL | Status: DC
  Administered 2015-02-07 – 2015-02-12 (×6): 81 mg via ORAL
  Filled 2015-02-06 (×6): qty 1

## 2015-02-06 MED ORDER — SODIUM CHLORIDE 0.9 % IJ SOLN
3.0000 mL | Freq: Two times a day (BID) | INTRAMUSCULAR | Status: DC
Start: 2015-02-06 — End: 2015-02-12
  Administered 2015-02-07 – 2015-02-12 (×11): 3 mL via INTRAVENOUS

## 2015-02-06 MED ORDER — DIGOXIN 125 MCG PO TABS
0.0625 mg | ORAL_TABLET | Freq: Every day | ORAL | Status: DC
  Administered 2015-02-07 – 2015-02-12 (×6): 0.0625 mg via ORAL
  Filled 2015-02-06 (×6): qty 1

## 2015-02-06 MED ORDER — ALLOPURINOL 100 MG PO TABS: 100.0000 mg | ORAL_TABLET | Freq: Every day | ORAL | Status: DC

## 2015-02-06 MED ORDER — IPRATROPIUM BROMIDE 0.02 % IN SOLN
0.5000 mg | Freq: Once | RESPIRATORY_TRACT | Status: AC
  Administered 2015-02-06: 0.5 mg via RESPIRATORY_TRACT
  Filled 2015-02-06: qty 2.5

## 2015-02-06 MED ORDER — HEPARIN SODIUM (PORCINE) 5000 UNIT/ML IJ SOLN
5000.0000 [IU] | Freq: Three times a day (TID) | INTRAMUSCULAR | Status: DC
  Administered 2015-02-06 – 2015-02-12 (×16): 5000 [IU] via SUBCUTANEOUS
  Filled 2015-02-06 (×16): qty 1

## 2015-02-06 MED ORDER — LEVALBUTEROL HCL 1.25 MG/0.5ML IN NEBU
1.2500 mg | INHALATION_SOLUTION | Freq: Four times a day (QID) | RESPIRATORY_TRACT | Status: DC
  Administered 2015-02-06: 1.25 mg via RESPIRATORY_TRACT
  Filled 2015-02-06: qty 0.5

## 2015-02-06 MED ORDER — ACETAMINOPHEN 325 MG PO TABS: 650.0000 mg | ORAL_TABLET | ORAL | Status: DC | PRN

## 2015-02-06 MED ORDER — ALBUTEROL SULFATE (2.5 MG/3ML) 0.083% IN NEBU
2.5000 mg | INHALATION_SOLUTION | Freq: Once | RESPIRATORY_TRACT | Status: AC
  Administered 2015-02-06: 2.5 mg via RESPIRATORY_TRACT
  Filled 2015-02-06: qty 3

## 2015-02-06 MED ORDER — FUROSEMIDE 10 MG/ML IJ SOLN
40.0000 mg | Freq: Once | INTRAMUSCULAR | Status: AC
  Administered 2015-02-06: 40 mg via INTRAVENOUS
  Filled 2015-02-06: qty 4

## 2015-02-06 MED ORDER — METOLAZONE 2.5 MG PO TABS
2.5000 mg | ORAL_TABLET | Freq: Every day | ORAL | Status: DC
  Administered 2015-02-07 – 2015-02-12 (×6): 2.5 mg via ORAL
  Filled 2015-02-06 (×6): qty 1

## 2015-02-06 MED ORDER — SODIUM CHLORIDE 0.9 % IV SOLN
250.0000 mL | INTRAVENOUS | Status: DC | PRN
  Administered 2015-02-06 – 2015-02-08 (×2): 250 mL via INTRAVENOUS

## 2015-02-06 MED ORDER — FUROSEMIDE 10 MG/ML IJ SOLN
40.0000 mg | Freq: Two times a day (BID) | INTRAMUSCULAR | Status: DC
  Administered 2015-02-07: 40 mg via INTRAVENOUS
  Filled 2015-02-06: qty 4

## 2015-02-06 MED ORDER — SODIUM CHLORIDE 0.9 % IJ SOLN: 3.0000 mL | INTRAMUSCULAR | Status: DC | PRN

## 2015-02-06 MED ORDER — DOPAMINE-DEXTROSE 3.2-5 MG/ML-% IV SOLN
5.0000 ug/kg/min | INTRAVENOUS | Status: DC
  Administered 2015-02-06: 5 ug/kg/min via INTRAVENOUS
  Administered 2015-02-07 (×2): 10 ug/kg/min via INTRAVENOUS
  Administered 2015-02-08: 5 ug/kg/min via INTRAVENOUS
  Filled 2015-02-06 (×3): qty 250

## 2015-02-06 NOTE — ED Notes (Signed)
Dr Terrence Dupont in at bedside with patient.

## 2015-02-06 NOTE — ED Notes (Signed)
Pt from home for eval of increase in sob over the past few days, pt with hx of CHF and states has been taking meds as prescribed. Denies any cp at this time, denies any new swelling to extremities. Pt arrives on RA but informed RN he wears home o2 '@2L'$ . Nasal cannula applied in triage.

## 2015-02-06 NOTE — ED Notes (Signed)
MD at bedside. 
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
## 2015-02-06 NOTE — ED Notes (Signed)
Pt noted to be 85% on 2L,  Oxygen increased to 3L pt noted to be 89-91%.

## 2015-02-07 ENCOUNTER — Encounter (HOSPITAL_COMMUNITY): Payer: Self-pay

## 2015-02-07 LAB — BASIC METABOLIC PANEL
Anion gap: 10 (ref 5–15)
BUN: 58 mg/dL — AB (ref 6–20)
CALCIUM: 8.6 mg/dL — AB (ref 8.9–10.3)
CHLORIDE: 98 mmol/L — AB (ref 101–111)
CO2: 26 mmol/L (ref 22–32)
CREATININE: 2.11 mg/dL — AB (ref 0.61–1.24)
GFR, EST AFRICAN AMERICAN: 35 mL/min — AB (ref >60)
GFR, EST NON AFRICAN AMERICAN: 30 mL/min — AB (ref >60)
Glucose, Bld: 120 mg/dL — ABNORMAL HIGH (ref 65–99)
Potassium: 4.3 mmol/L (ref 3.5–5.1)
SODIUM: 134 mmol/L — AB (ref 135–145)

## 2015-02-07 LAB — TROPONIN I
TROPONIN I: 0.04 ng/mL — AB (ref <0.031)
Troponin I: 0.04 ng/mL — ABNORMAL HIGH (ref <0.031)

## 2015-02-07 MED ORDER — FUROSEMIDE 10 MG/ML IJ SOLN
40.0000 mg | Freq: Every day | INTRAMUSCULAR | Status: DC
Start: 2015-02-08 — End: 2015-02-08
  Administered 2015-02-08: 40 mg via INTRAVENOUS
  Filled 2015-02-07: qty 4

## 2015-02-07 MED ORDER — LEVALBUTEROL HCL 1.25 MG/0.5ML IN NEBU
1.2500 mg | INHALATION_SOLUTION | Freq: Four times a day (QID) | RESPIRATORY_TRACT | Status: DC
  Administered 2015-02-07 – 2015-02-10 (×15): 1.25 mg via RESPIRATORY_TRACT
  Filled 2015-02-07 (×16): qty 0.5

## 2015-02-07 MED ORDER — NOREPINEPHRINE BITARTRATE 1 MG/ML IV SOLN
0.0000 ug/min | INTRAVENOUS | Status: DC
  Administered 2015-02-07: 2 ug/min via INTRAVENOUS
  Administered 2015-02-08: 10 ug/min via INTRAVENOUS
  Filled 2015-02-07 (×2): qty 4

## 2015-02-07 NOTE — Progress Notes (Signed)
Spoke with Dr. Terrence Dupont about pt BP being low. New orders received will continue to monitor closely.

## 2015-02-07 NOTE — Progress Notes (Signed)
Spoke with Dr. Terrence Dupont about pt low BP. New Dopamine orders received. Will continue to monitor closely.

## 2015-02-07 NOTE — Progress Notes (Signed)
Utilization review completed.  
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
## 2015-02-08 ENCOUNTER — Inpatient Hospital Stay (HOSPITAL_COMMUNITY): Payer: Commercial Managed Care - HMO

## 2015-02-08 DIAGNOSIS — I5030 Unspecified diastolic (congestive) heart failure: Secondary | ICD-10-CM

## 2015-02-08 DIAGNOSIS — R0600 Dyspnea, unspecified: Secondary | ICD-10-CM

## 2015-02-08 DIAGNOSIS — I509 Heart failure, unspecified: Secondary | ICD-10-CM | POA: Insufficient documentation

## 2015-02-08 DIAGNOSIS — R0602 Shortness of breath: Secondary | ICD-10-CM | POA: Insufficient documentation

## 2015-02-08 DIAGNOSIS — I38 Endocarditis, valve unspecified: Secondary | ICD-10-CM

## 2015-02-08 LAB — BASIC METABOLIC PANEL
Anion gap: 11 (ref 5–15)
Anion gap: 15 (ref 5–15)
BUN: 55 mg/dL — AB (ref 6–20)
BUN: 49 mg/dL — AB (ref 6–20)
CHLORIDE: 99 mmol/L — AB (ref 101–111)
CHLORIDE: 96 mmol/L — AB (ref 101–111)
CO2: 23 mmol/L (ref 22–32)
CO2: 21 mmol/L — ABNORMAL LOW (ref 22–32)
CREATININE: 1.72 mg/dL — AB (ref 0.61–1.24)
Calcium: 8.4 mg/dL — ABNORMAL LOW (ref 8.9–10.3)
Calcium: 8.5 mg/dL — ABNORMAL LOW (ref 8.9–10.3)
Creatinine, Ser: 1.71 mg/dL — ABNORMAL HIGH (ref 0.61–1.24)
GFR calc Af Amer: 45 mL/min — ABNORMAL LOW (ref >60)
GFR calc Af Amer: 45 mL/min — ABNORMAL LOW (ref >60)
GFR calc non Af Amer: 39 mL/min — ABNORMAL LOW (ref >60)
GFR, EST NON AFRICAN AMERICAN: 39 mL/min — AB (ref >60)
GLUCOSE: 140 mg/dL — AB (ref 65–99)
Glucose, Bld: 137 mg/dL — ABNORMAL HIGH (ref 65–99)
POTASSIUM: 3.8 mmol/L (ref 3.5–5.1)
Potassium: 3.8 mmol/L (ref 3.5–5.1)
SODIUM: 132 mmol/L — AB (ref 135–145)
Sodium: 133 mmol/L — ABNORMAL LOW (ref 135–145)

## 2015-02-08 LAB — POCT I-STAT 3, ART BLOOD GAS (G3+)
Acid-base deficit: 2 mmol/L (ref 0.0–2.0)
BICARBONATE: 21.2 meq/L (ref 20.0–24.0)
O2 Saturation: 90 %
TCO2: 22 mmol/L (ref 0–100)
pCO2 arterial: 31.4 mmHg — ABNORMAL LOW (ref 35.0–45.0)
pH, Arterial: 7.438 (ref 7.350–7.450)
pO2, Arterial: 55 mmHg — ABNORMAL LOW (ref 80.0–100.0)

## 2015-02-08 LAB — GLUCOSE, CAPILLARY
Glucose-Capillary: 165 mg/dL — ABNORMAL HIGH (ref 65–99)
Glucose-Capillary: 166 mg/dL — ABNORMAL HIGH (ref 65–99)

## 2015-02-08 LAB — SEDIMENTATION RATE: SED RATE: 51 mm/h — AB (ref 0–16)

## 2015-02-08 LAB — CBC
HEMATOCRIT: 40.8 % (ref 39.0–52.0)
Hemoglobin: 12.6 g/dL — ABNORMAL LOW (ref 13.0–17.0)
MCH: 28.5 pg (ref 26.0–34.0)
MCHC: 30.9 g/dL (ref 30.0–36.0)
MCV: 92.3 fL (ref 78.0–100.0)
Platelets: 296 10*3/uL (ref 150–400)
RBC: 4.42 MIL/uL (ref 4.22–5.81)
RDW: 18.8 % — ABNORMAL HIGH (ref 11.5–15.5)
WBC: 6.8 10*3/uL (ref 4.0–10.5)

## 2015-02-08 LAB — LACTIC ACID, PLASMA: LACTIC ACID, VENOUS: 2.5 mmol/L — AB (ref 0.5–2.0)

## 2015-02-08 LAB — BRAIN NATRIURETIC PEPTIDE: B Natriuretic Peptide: 1662.4 pg/mL — ABNORMAL HIGH (ref 0.0–100.0)

## 2015-02-08 LAB — CORTISOL: CORTISOL PLASMA: 14.3 ug/dL

## 2015-02-08 MED ORDER — NOREPINEPHRINE BITARTRATE 1 MG/ML IV SOLN
0.0000 ug/min | INTRAVENOUS | Status: DC
  Filled 2015-02-08: qty 16

## 2015-02-08 MED ORDER — CETYLPYRIDINIUM CHLORIDE 0.05 % MT LIQD
7.0000 mL | Freq: Two times a day (BID) | OROMUCOSAL | Status: DC
  Administered 2015-02-08 – 2015-02-12 (×8): 7 mL via OROMUCOSAL

## 2015-02-08 MED ORDER — SODIUM CHLORIDE 0.9 % IV BOLUS (SEPSIS)
500.0000 mL | Freq: Once | INTRAVENOUS | Status: AC
  Administered 2015-02-08: 500 mL via INTRAVENOUS

## 2015-02-08 MED ORDER — SODIUM CHLORIDE 0.9 % IV BOLUS (SEPSIS)
250.0000 mL | Freq: Once | INTRAVENOUS | Status: AC
  Administered 2015-02-08: 250 mL via INTRAVENOUS

## 2015-02-08 MED ORDER — NOREPINEPHRINE BITARTRATE 1 MG/ML IV SOLN
0.0000 ug/min | INTRAVENOUS | Status: DC
  Administered 2015-02-08: 14 ug/min via INTRAVENOUS
  Administered 2015-02-08: 16 ug/min via INTRAVENOUS
  Administered 2015-02-08 – 2015-02-09 (×2): 10 ug/min via INTRAVENOUS
  Filled 2015-02-08 (×5): qty 4

## 2015-02-08 MED ORDER — VANCOMYCIN HCL 10 G IV SOLR
1250.0000 mg | INTRAVENOUS | Status: DC
  Filled 2015-02-08: qty 1250

## 2015-02-08 MED ORDER — METHYLPREDNISOLONE SODIUM SUCC 125 MG IJ SOLR
60.0000 mg | Freq: Three times a day (TID) | INTRAMUSCULAR | Status: DC
  Administered 2015-02-08 – 2015-02-09 (×2): 60 mg via INTRAVENOUS
  Filled 2015-02-08 (×2): qty 2

## 2015-02-08 MED ORDER — VANCOMYCIN HCL 10 G IV SOLR
2000.0000 mg | Freq: Once | INTRAVENOUS | Status: AC
  Administered 2015-02-08: 2000 mg via INTRAVENOUS
  Filled 2015-02-08: qty 2000

## 2015-02-08 MED ORDER — DEXTROSE 5 % IV SOLN
1.0000 g | Freq: Three times a day (TID) | INTRAVENOUS | Status: DC
  Administered 2015-02-08 – 2015-02-10 (×6): 1 g via INTRAVENOUS
  Filled 2015-02-08 (×8): qty 1

## 2015-02-08 NOTE — Consult Note (Signed)
   [facility] Inpatient Consult   02/08/2015  [facility] Jun 18, 1945 [id number] Patient re-admitted. Patient is currently active [up to discharge]  with [facility] for chronic disease management services.  Patient has been engaged by a [employer]. Patient admitted 02/06/15 with hypotensive episode and started on Dopamine per notes.  Our community based plan of care has focused on disease management and community resource support.  Patient will receive a post discharge transition of care call and will be evaluated for monthly home visits for assessments and disease process education.  Made inpatient Case Manager and Community RNCM aware that [facility] following. Of note, [facility] Care Management services does not replace or interfere with any services that are needed or arranged by inpatient case management or social work.  For additional questions or referrals please contact: Natividad Brood, RN BSN [facility] Liaison  [phone] business mobile phone

## 2015-02-08 NOTE — Progress Notes (Signed)
Patient hypotensive, SBP in the low 80's with levophed increased to 20 mcg. Patient shivering and diaphoretic,  RR in the 30's. Ramaswamy, MD made aware, orders for 500 cc NS bolus and blood gas. Will continue to monitor.  1840: Blood gas results called to [facility] RN. Patient now on venturi mask 14L, FiO2 55%.

## 2015-02-08 NOTE — Progress Notes (Signed)
Patient transferred to 3H78 with no complications. Report received from [place], [place].

## 2015-02-08 NOTE — Progress Notes (Signed)
    Recent Labs Lab 02/08/15 1521  LATICACIDVEN 3.3*   Patient looking well and sitting in chair  Plan Recheck in 3h  Dr. Brand Males, M.D., [facility].C.P Pulmonary and Critical Care Medicine Staff Physician [place] [facility] Pager: [phone], If no answer or between  15:00h - 7:00h: call [phone]  02/08/2015 4:19 PM
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
## 2015-02-08 NOTE — Progress Notes (Signed)
Attempted to transfer patient out of chair to wheelchair for CT. Per patient he cannot tolerate laying flat for long due to SOB. Patient became tachypneic, and desats to low-mid 80's after transfer to wheelchair. Placed patient on Venturi mask at 14 liters and FiO255%. Dr. Chase Caller aware, will reassess patient again this evening and transfer to CT in bed.

## 2015-02-08 NOTE — Progress Notes (Signed)
eLink Physician-Brief Progress Note Patient Name: [name] DOB: November 06, 1945 MRN: [id number]   Date of Service  02/08/2015  HPI/Events of Note  desats and tachypneic while transferring to wheel chair for CT  eICU Interventions  Hold off wheel chaitr transport for CT Hold off CT for several hours Reassess in few to we several hrs if stable to go for CT with bed     Intervention Category Intermediate Interventions: Other:  Ananda Caya 02/08/2015, 5:29 PM

## 2015-02-08 NOTE — Progress Notes (Signed)
CRITICAL VALUE ALERT  Critical value received:  Lactic acid 3.3  Date of notification: 02/08/15  Time of notification:  1611  Critical value read back:Yes.    Nurse who received alert:  Dennison Mascot  MD notified (1st page):  Elink, Dr. Chase Caller  Time of first page:  1613  Responding MD:  Dr. Chase Caller   Time MD responded:  [phone]  Orders to trend values and repeat level in 3 hrs.

## 2015-02-08 NOTE — Progress Notes (Signed)
CRITICAL VALUE ALERT  Critical value received:  Lactic acid 2.5  Date of notification:  02-08-15  Time of notification:  2050  Critical value read back:Yes.    Nurse who received alert:  Amedeo Plenty, RN  MD notified (1st page):  E-link  Time of first page:  2120  Result reported to [facility] in Page

## 2015-02-08 NOTE — Progress Notes (Signed)
eLink Physician-Brief Progress Note Patient Name: KELAN PRITT DOB: 1945/10/24 MRN: [id number]   Date of Service  02/08/2015  HPI/Events of Note  Solumedrol as requested by dr Titus Mould  eICU Interventions       Intervention Category Evaluation Type: Other  Alekhya Gravlin 02/08/2015, 7:59 PM

## 2015-02-08 NOTE — Progress Notes (Signed)
Pt seen at bedside per request of Dr. Chase Caller. Pt in no acute distress with mild work of breathing with 100% SpO2 on venturi mask. He reports breathing better than earlier today. Will continue to monitor on venturi mask. Pt is DNI. Pt is being treated for possible pneumonitis induced by nivolumab immunotherapy for NSCLC with corticosteroid therapy.    Dr. Juluis Mire  PGY-3 IMTS Pager [phone]

## 2015-02-09 ENCOUNTER — Inpatient Hospital Stay (HOSPITAL_COMMUNITY): Payer: Commercial Managed Care - HMO

## 2015-02-09 DIAGNOSIS — C3400 Malignant neoplasm of unspecified main bronchus: Secondary | ICD-10-CM

## 2015-02-09 DIAGNOSIS — I5023 Acute on chronic systolic (congestive) heart failure: Secondary | ICD-10-CM

## 2015-02-09 LAB — BASIC METABOLIC PANEL
ANION GAP: 12 (ref 5–15)
BUN: 37 mg/dL — ABNORMAL HIGH (ref 6–20)
CALCIUM: 8.1 mg/dL — AB (ref 8.9–10.3)
CO2: 24 mmol/L (ref 22–32)
CREATININE: 1.41 mg/dL — AB (ref 0.61–1.24)
Chloride: 96 mmol/L — ABNORMAL LOW (ref 101–111)
GFR, EST AFRICAN AMERICAN: 57 mL/min — AB (ref >60)
GFR, EST NON AFRICAN AMERICAN: 49 mL/min — AB (ref >60)
GLUCOSE: 149 mg/dL — AB (ref 65–99)
Potassium: 3.6 mmol/L (ref 3.5–5.1)
Sodium: 132 mmol/L — ABNORMAL LOW (ref 135–145)

## 2015-02-09 LAB — LACTIC ACID, PLASMA
LACTIC ACID, VENOUS: 3.3 mmol/L — AB (ref 0.5–2.0)
LACTIC ACID, VENOUS: 1.2 mmol/L (ref 0.5–2.0)

## 2015-02-09 LAB — CBC
HCT: 38.5 % — ABNORMAL LOW (ref 39.0–52.0)
HEMOGLOBIN: 12 g/dL — AB (ref 13.0–17.0)
MCH: 28.6 pg (ref 26.0–34.0)
MCHC: 31.2 g/dL (ref 30.0–36.0)
MCV: 91.7 fL (ref 78.0–100.0)
PLATELETS: 243 10*3/uL (ref 150–400)
RBC: 4.2 MIL/uL — AB (ref 4.22–5.81)
RDW: 19 % — ABNORMAL HIGH (ref 11.5–15.5)
WBC: 6.8 10*3/uL (ref 4.0–10.5)

## 2015-02-09 LAB — GLUCOSE, CAPILLARY
GLUCOSE-CAPILLARY: 136 mg/dL — AB (ref 65–99)
GLUCOSE-CAPILLARY: 194 mg/dL — AB (ref 65–99)
Glucose-Capillary: 145 mg/dL — ABNORMAL HIGH (ref 65–99)

## 2015-02-09 MED ORDER — INSULIN ASPART 100 UNIT/ML ~~LOC~~ SOLN
0.0000 [IU] | Freq: Three times a day (TID) | SUBCUTANEOUS | Status: DC
  Administered 2015-02-09 – 2015-02-10 (×3): 1 [IU] via SUBCUTANEOUS
  Administered 2015-02-10 (×2): 2 [IU] via SUBCUTANEOUS
  Administered 2015-02-11: 1 [IU] via SUBCUTANEOUS
  Administered 2015-02-11: 2 [IU] via SUBCUTANEOUS
  Administered 2015-02-12: 1 [IU] via SUBCUTANEOUS
  Administered 2015-02-12: 2 [IU] via SUBCUTANEOUS

## 2015-02-09 MED ORDER — HYDROCORTISONE NA SUCCINATE PF 100 MG IJ SOLR
50.0000 mg | Freq: Four times a day (QID) | INTRAMUSCULAR | Status: DC
  Administered 2015-02-09 – 2015-02-10 (×5): 50 mg via INTRAVENOUS
  Filled 2015-02-09 (×5): qty 2
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
## 2015-02-10 ENCOUNTER — Inpatient Hospital Stay (HOSPITAL_COMMUNITY): Payer: Commercial Managed Care - HMO

## 2015-02-10 LAB — COMPREHENSIVE METABOLIC PANEL
ALBUMIN: 2.3 g/dL — AB (ref 3.5–5.0)
ALT: 11 U/L — ABNORMAL LOW (ref 17–63)
ANION GAP: 10 (ref 5–15)
AST: 24 U/L (ref 15–41)
Alkaline Phosphatase: 66 U/L (ref 38–126)
BUN: 43 mg/dL — ABNORMAL HIGH (ref 6–20)
CHLORIDE: 98 mmol/L — AB (ref 101–111)
CO2: 26 mmol/L (ref 22–32)
Calcium: 8.3 mg/dL — ABNORMAL LOW (ref 8.9–10.3)
Creatinine, Ser: 1.54 mg/dL — ABNORMAL HIGH (ref 0.61–1.24)
GFR calc Af Amer: 51 mL/min — ABNORMAL LOW (ref >60)
GFR calc non Af Amer: 44 mL/min — ABNORMAL LOW (ref >60)
GLUCOSE: 143 mg/dL — AB (ref 65–99)
POTASSIUM: 4.1 mmol/L (ref 3.5–5.1)
SODIUM: 134 mmol/L — AB (ref 135–145)
TOTAL PROTEIN: 5.7 g/dL — AB (ref 6.5–8.1)
Total Bilirubin: 0.7 mg/dL (ref 0.3–1.2)

## 2015-02-10 LAB — CBC
HEMATOCRIT: 34.2 % — AB (ref 39.0–52.0)
HEMOGLOBIN: 10.9 g/dL — AB (ref 13.0–17.0)
MCH: 29.1 pg (ref 26.0–34.0)
MCHC: 31.9 g/dL (ref 30.0–36.0)
MCV: 91.2 fL (ref 78.0–100.0)
Platelets: 310 10*3/uL (ref 150–400)
RBC: 3.75 MIL/uL — ABNORMAL LOW (ref 4.22–5.81)
RDW: 18.3 % — ABNORMAL HIGH (ref 11.5–15.5)
WBC: 3.7 10*3/uL — ABNORMAL LOW (ref 4.0–10.5)

## 2015-02-10 LAB — GLUCOSE, CAPILLARY
GLUCOSE-CAPILLARY: 128 mg/dL — AB (ref 65–99)
GLUCOSE-CAPILLARY: 186 mg/dL — AB (ref 65–99)
GLUCOSE-CAPILLARY: 111 mg/dL — AB (ref 65–99)
Glucose-Capillary: 153 mg/dL — ABNORMAL HIGH (ref 65–99)

## 2015-02-10 LAB — BRAIN NATRIURETIC PEPTIDE: B Natriuretic Peptide: 1027 pg/mL — ABNORMAL HIGH (ref 0.0–100.0)

## 2015-02-10 LAB — PROCALCITONIN: Procalcitonin: <0.1 ng/mL

## 2015-02-10 MED ORDER — HYDROCORTISONE NA SUCCINATE PF 100 MG IJ SOLR
25.0000 mg | Freq: Four times a day (QID) | INTRAMUSCULAR | Status: DC
  Administered 2015-02-10 – 2015-02-12 (×8): 25 mg via INTRAVENOUS
  Filled 2015-02-10 (×8): qty 2

## 2015-02-10 NOTE — Significant Event (Signed)
Spoke with receiving RN, who is unable to receive report or patient at this time. Stated she is needed to take her patient to transportation for discharge.

## 2015-02-10 NOTE — Progress Notes (Signed)
eLink Physician-Brief Progress Note Patient Name: [name] DOB: [birth year] MRN: [id number]   Date of Service  02/10/2015  HPI/Events of Note  Shortage of ICU beds. Pt is stable, off pressors  eICU Interventions  OK to transfer to SDU. Will contact Dr. Terrence Dupont     Intervention Category Minor Interventions: Other:  Arissa Fagin 02/10/2015, 3:47 AM
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
## 2015-02-10 NOTE — Progress Notes (Signed)
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 Also with severe PAH, OSA, stage IV CKD, and chronic dyspnea since prior lobectomy.  He was in his USOH leading up to cycle # 5 and this went uneventful. Then following this on 1/7 he presents to the ED w/ cc: about 1 d h/o worsening shortness of breath & cough w/ clear sputum. He denied sick exposure, fever, chills, HA, nasal congestion, sore throat, PND, palpitations, wheezing, N/V/diarrhea. Was admitted to the cardiac service w/ working dx of decompensated HF. Was treated w/ IV hydration and on-going vaso-active gtt support. Was transferred to ICU on 1/8 for persistent hypotension requiring escalating pressor support. PCCM was asked to see on 1/9 for further suggestions re: acute on chronic dyspnea and on-going pressor requirements.   SUBJECTIVE:  Pt feels back to baseline, is now on home O2 requirements of 2L -1.5L since admission still  VITAL SIGNS: BP 95/60 mmHg  Pulse 68  Temp(Src) 96.4 F (35.8 C) (Axillary)  Resp 16  Ht '5\' 9"'$  (1.753 m)  Wt 113.6 kg (250 lb 7.1 oz)  BMI 36.97 kg/m2  SpO2 95%  HEMODYNAMICS:    VENTILATOR SETTINGS:    INTAKE / OUTPUT: I/O last 3 completed shifts: In: 1714.4 [P.O.:730; I.V.:784.4; IV Piggyback:200] Out: 2200 [Urine:2200]  PHYSICAL EXAMINATION: General:  Awake, alert, sitting up in chair in no acute distress  Neuro:  No focal def  HEENT: [place]/AT Cardiovascular:  RRR Lungs:  CTAB Abdomen:  Soft, not tender + bowel sounds  Ext: B/l LE edema  Skin:  Warm and intact   LABS:  BMET  Recent Labs Lab 02/08/15 1521 02/09/15 0024 02/10/15 0222  NA 132* 132* 134*  K 3.8 3.6 4.1  CL 96* 96* 98*  CO2 21* 24 26  BUN 49*  37* 43*  CREATININE 1.72* 1.41* 1.54*  GLUCOSE 137* 149* 143*    Electrolytes  Recent Labs Lab 02/08/15 1521 02/09/15 0024 02/10/15 0222  CALCIUM 8.5* 8.1* 8.3*    CBC  Recent Labs Lab 02/08/15 0318 02/09/15 0024 02/10/15 0222  WBC 6.8 6.8 3.7*  HGB 12.6* 12.0* 10.9*  HCT 40.8 38.5* 34.2*  PLT 296 243 310    Coag's No results for input(s): APTT, INR in the last 168 hours.  Sepsis Markers  Recent Labs Lab 02/08/15 1521 02/08/15 1934 02/09/15 0024  LATICACIDVEN 3.3* 2.5* 1.2    ABG  Recent Labs Lab 02/08/15 1833  PHART 7.438  PCO2ART 31.4*  PO2ART 55.0*    Liver Enzymes  Recent Labs Lab 02/04/15 0919 02/06/15 2114 02/10/15 0222  AST '23 27 24  '$ ALT 11 12* 11*  ALKPHOS 87 71 66  BILITOT 0.81 1.3* 0.7  ALBUMIN 3.0* 2.8* 2.3*    Cardiac Enzymes  Recent Labs Lab 02/06/15 2114 02/07/15 0304 02/07/15 0905  TROPONINI 0.04* 0.04* 0.04*    Glucose  Recent Labs Lab 02/08/15 1829 02/08/15 2033 02/09/15 1138 02/09/15 1616 02/09/15 2151 02/10/15 0852  GLUCAP 165* 166* 136* 145* 194* 128*    Imaging Dg Chest Port 1 View  02/10/2015  CLINICAL DATA:  CHF both acute and chronic, shortness of breath, lung malignancy, COPD. EXAM: PORTABLE CHEST 1 VIEW COMPARISON:  PA  and lateral chest x-ray of February 06, 2015 and CT scan of the chest of February 09, 2015. FINDINGS: The right lung is well-expanded. The interstitial markings are coarse in the infrahilar region but appears stable. On the left increased interstitial markings in the mid and lower lung are again demonstrated but are stable. The cardiac silhouette remains enlarged. The central pulmonary vascularity is mildly prominent and perihilar interstitial density has improved slightly especially on the right. A Port-A-Cath appliance is in stable position with the tip projecting over the proximal to mid SVC. The trachea is midline. The bony thorax exhibits no acute abnormality. IMPRESSION: Slight  interval improvement in the appearance of the pulmonary interstitium of both lungs suggesting some improving interstitial edema with residual underlying chronic interstitial change. Electronically Signed   By: David  Martinique M.D.   On: 02/10/2015 07:08     STUDIES:  CT chest 1/9>>> PCT 1/9>>>??sent? 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  Multiple borderline enlarged/mildly enlarged mediastinal and hilar lymph nodes.  Lymphadenopathy such is this is commonly seen in the setting of ILD and may relate to underlying ILD, however, metastatic disease not entirely excluded. Persistent slowly enlarging mass-like opacity in the posterior aspect of the RLLwith internal air bronchograms. While this could represent a benign process such as rounded atelectasis, the possibility of a slow growing neoplasm such as an adenocarcinoma should be considered, and correlation with CT-guided or bronchoscopic biopsy should be considered if clinically appropriate. Moderate centrilobular and paraseptal emphysema with bullous changes in the lung apices bilaterally. Splenomegaly. Cardiomegaly with right ventricular and right atrial dilatation. Additionally, there is severe dilatation of the pulmonic trunk (4.4cm in diameter), suggesting underlying pulmonary arterial hypertension. Atherosclerosis, including left main and 3 vessel coronary arterydisease. Assessment for potential risk factor modification, dietary therapy or pharmacologic therapy may be warranted, if clinically indicated.  CULTURES: Sputum 1/9>>>??sent? 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 Also has h/o severe PAH, OSA, stage IV CKD, and chronic dyspnea since prior lobectomy.  Admitted 1/7 w/ acute on chronic dyspnea and pulmonary infiltrates. Concerned that this may be drug related pneumonitis from nivolumab & that the hypotension is simply d/t preload reduction in a pt w/ underlying severe PAH. We will get high res CT chest, start empiric abx, send sed rate and PCT. Low threshold for steroid trial given little else to offer.    ASSESSMENT / PLAN:  PULMONARY A: Acute on chronic hypoxic respiratory failure in setting of acute on chronic pulmonary infiltrates  NSCLC w/ prior lobectomy on nivolumab Severe secondary PAH Not impressed pneumonitis P:   Send PCT, ESR 51 Steroids not needed, will reduce to off as far as lungs Needs taper steroids to off over 3 days for ral AI  CARDIOVASCULAR A:  Shock (circulatory) -hypovolemia likely; resolved  Elevated trop I H/o severe secondary PAH Adrenal insuff P:  Even balance goals Off pressors  Stress roids redcution to off  RENAL A:   Stage IV CKD w/ acute on chronic renal failure , hypovolemic ARF Scr improved.  P:   Hold further lasix and IVF for now   GASTROINTESTINAL A:   No acute  P:   Diet as tol   HEMATOLOGIC A:   NSCLC w/ disease progression  Anemia  of chronic disease  P:  Limit phlebotomy [place] heparin   INFECTIOUS A:   Possible PNA, unlikley P:   Empiric abx, ceftaz day#3, would consider d/c check PCT  ENDOCRINE A:   adrenal failure Glu rising P:   cont hydrocort 50 q6h SSI  NEUROLOGIC A:   No acute  P:   RASS goal: 0 Supportive care    FAMILY  - Updates: pending   - Inter-disciplinary family meet or Palliative Care meeting due by: 1/15  Michail Jewels, MD Internal Medicine, PGY-3  STAFF NOTE: I, Merrie Roof, MD FACP have personally reviewed patient's available data, including medical history, events of note, physical examination and test results as part of my evaluation. I have discussed with resident/NP and other care providers such as pharmacist, RN and RRT.  In addition, I personally evaluated patient and elicited key findings of: no distress in chair, in settin gpulm htn he became hypovolemic with preload needs, he improved with holding lasix and pos baalnce provided, NOT impressed for pneumonitis, no steroids needed for lungs, would taper stress steroids off over 3 days, likely will need lasix restart per cards at lower doses, not impressed infection overall, mass like area on CT needs pulm office  / onc follow up as soon as dc home, no pna, noted, dc abx, will sign off, call if needed   Lavon Paganini. Titus Mould, MD, [facility] Pgr: [facility] 02/10/2015 12:26 PM '

## 2015-02-10 NOTE — Progress Notes (Signed)
Spoke with Dr. Terrence Dupont regarding moving patient to SDU. Patient stable, OK to move to [facility] SDU.

## 2015-02-10 NOTE — Significant Event (Signed)
Attempted to call report to [facility] receiving RN. However, receiving RN currently discharging another patient. RN provided number for call back.

## 2015-02-10 NOTE — Care Management Important Message (Signed)
Important Message  Patient Details  Name: [name] MRN: [id number] Date of Birth: 1945-07-27   Medicare Important Message Given:  Yes    Barb Merino Tomio Kirk 02/10/2015, 12:16 PM

## 2015-02-11 LAB — GLUCOSE, CAPILLARY
GLUCOSE-CAPILLARY: 102 mg/dL — AB (ref 65–99)
Glucose-Capillary: 133 mg/dL — ABNORMAL HIGH (ref 65–99)
Glucose-Capillary: 166 mg/dL — ABNORMAL HIGH (ref 65–99)
Glucose-Capillary: 108 mg/dL — ABNORMAL HIGH (ref 65–99)

## 2015-02-11 MED ORDER — LEVALBUTEROL HCL 1.25 MG/0.5ML IN NEBU
1.2500 mg | INHALATION_SOLUTION | Freq: Three times a day (TID) | RESPIRATORY_TRACT | Status: DC
  Administered 2015-02-11 – 2015-02-12 (×4): 1.25 mg via RESPIRATORY_TRACT
  Filled 2015-02-11 (×4): qty 0.5

## 2015-02-11 NOTE — Progress Notes (Signed)
OT Cancellation Note  Patient Details Name: [name] MRN: [id number] DOB: [birth year]   Cancelled Treatment:    Reason Eval/Treat Not Completed: Other (comment) (RT in with patient). Will check back as schedule allows.   Chrys Racer , MS, OTR/L, CLT Pager: [phone]  02/11/2015, 1:50 PM
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
## 2015-02-12 ENCOUNTER — Telehealth: Payer: Self-pay | Admitting: Internal Medicine

## 2015-02-12 LAB — GLUCOSE, CAPILLARY
GLUCOSE-CAPILLARY: 161 mg/dL — AB (ref 65–99)
Glucose-Capillary: 125 mg/dL — ABNORMAL HIGH (ref 65–99)

## 2015-02-12 MED ORDER — PREDNISONE 20 MG PO TABS: 20.0000 mg | ORAL_TABLET | Freq: Every day | ORAL | Status: DC

## 2015-02-12 MED ORDER — PREDNISONE 10 MG PO TABS: 10.0000 mg | ORAL_TABLET | Freq: Every day | ORAL | Status: DC

## 2015-02-12 MED ORDER — TORSEMIDE 10 MG PO TABS: 10.0000 mg | ORAL_TABLET | Freq: Every day | ORAL | Status: DC

## 2015-02-12 NOTE — Progress Notes (Signed)
Patient was discharge at 1:40 pm accompanied by NT and patient's son via wheelchair with 2L of O2. Discharge instructions , medication regimen received, and patient education given. Patient verbalized understanding. All personal belongings given. Denies any pain. Telemetry box and IV removed prior to discharge.
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
## 2015-02-12 NOTE — Discharge Summary (Signed)
Discharge summary dictated on 02/12/2015, dictation number is [phone]

## 2015-02-12 NOTE — Discharge Instructions (Signed)
Lung Cancer °Lung cancer occurs when abnormal cells in the lung grow out of control and form a mass (tumor). There are several types of lung cancer. The two most common types are: °· Non-small cell. In this type of lung cancer, abnormal cells are larger and grow more slowly than those of small cell lung cancer. °· Small cell. In this type of lung cancer, abnormal cells are smaller than those of non-small cell lung cancer. Small cell lung cancer gets worse faster than non-small cell lung cancer. °CAUSES  °The leading cause of lung cancer is smoking tobacco. The second leading cause is radon exposure. °RISK FACTORS °· Smoking tobacco. °· Exposure to secondhand tobacco smoke. °· Exposure to radon gas. °· Exposure to asbestos. °· Exposure to arsenic in drinking water. °· Air pollution. °· Family or personal history of lung cancer. °· Lung radiation therapy. °· Being older than 65 years. °SIGNS AND SYMPTOMS  °In the early stages, symptoms may not be present. As the cancer progresses, symptoms may include: °· A lasting cough, possibly with blood. °· Fatigue. °· Unexplained weight loss. °· Shortness of breath. °· Wheezing. °· Chest pain. °· Loss of appetite. °Symptoms of advanced lung cancer include: °· Hoarseness. °· Bone or joint pain. °· Weakness. °· Nail problems. °· Face or arm swelling. °· Paralysis of the face. °· Drooping eyelids. °DIAGNOSIS  °Lung cancer can be identified with a physical exam and with tests such as: °· A chest X-ray. °· A CT scan. °· Blood tests. °· A biopsy. °After a diagnosis is made, you will have more tests to determine the stage of the cancer. The stages of non-small cell lung cancer are: °· Stage 0, also called carcinoma in situ. At this stage, abnormal cells are found in the inner lining of your lung or lungs. °· Stage I. At this stage, abnormal cells have grown into a tumor that is no larger than 5 cm across. The cancer has entered the deeper lung tissue but has not yet entered the lymph  nodes or other parts of the body. °· Stage II. At this stage, the tumor is 7 cm across or smaller and has entered nearby lymph nodes. Or, the tumor is 5 cm across or smaller and has invaded surrounding tissue but is not found in nearby lymph nodes. There may be more than one tumor present. °· Stage III. At this stage, the tumor may be any size. There may be more than one tumor in the lungs. The cancer cells have spread to the lymph nodes and possibly to other organs. °· Stage IV. At this stage, there are tumors in both lungs and the cancer has spread to other areas of the body. °The stages of small cell lung cancer are: °· Limited. At this stage, the cancer is found only on one side of the chest. °· Extensive. At this stage, the cancer is in the lungs and in tissues on the other side of the chest. The cancer has spread to other organs or is found in the fluid between the layers of your lungs. °TREATMENT  °Depending on the type and stage of your lung cancer, you may be treated with: °· Surgery. This is done to remove a tumor. °· Radiation therapy. This treatment destroys cancer cells using X-rays or other types of radiation. °· Chemotherapy. This treatment uses medicines to destroy cancer cells. °· Targeted therapy. This treatment aims to destroy only cancer cells instead of all cells as other therapies do. °You may   also have a combination of treatments. HOME CARE INSTRUCTIONS   Do not use any tobacco products. This includes cigarettes, chewing tobacco, and electronic cigarettes. If you need help quitting, ask your health care provider.  Take medicines only as directed by your health care provider.  Eat a healthy diet. Work with a dietitian to make sure you are getting the nutrition you need.  Consider joining a support group or seeking counseling to help you cope with the stress of having lung cancer.  Let your cancer specialist (oncologist) know if you are admitted to the hospital.  Keep all follow-up  visits as directed by your health care provider. This is important. SEEK MEDICAL CARE IF:   You lose weight without trying.  You have a persistent cough and wheezing.  You feel short of breath.  You tire easily.  You experience bone or joint pain.  You have difficulty swallowing.  You feel hoarse or notice your voice changing.  Your pain medicine is not helping. SEEK IMMEDIATE MEDICAL CARE IF:   You cough up blood.  You have new breathing problems.  You develop chest pain.  You develop swelling in:  One or both ankles or legs.  Your face, neck, or arms.  You are confused.  You experience paralysis in your face or a drooping eyelid.   This information is not intended to replace advice given to you by your health care provider. Make sure you discuss any questions you have with your health care provider.   Document Released: 04/24/2000 Document Revised: 10/07/2014 Document Reviewed: 05/22/2013 Elsevier Interactive Patient Education 2016 Elsevier Inc[employer] Heart Failure Heart failure is a condition in which the heart has trouble pumping blood. This means your heart does not pump blood efficiently for your body to work well. In some cases of heart failure, fluid may back up into your lungs or you may have swelling (edema) in your lower legs. Heart failure is usually a long-term (chronic) condition. It is important for you to take good care of yourself and follow your health care provider's treatment plan. CAUSES  Some health conditions can cause heart failure. Those health conditions include:  High blood pressure (hypertension). Hypertension causes the heart muscle to work harder than normal. When pressure in the blood vessels is high, the heart needs to pump (contract) with more force in order to circulate blood throughout the body. High blood pressure eventually causes the heart to become stiff and weak.  Coronary artery disease (CAD). CAD is the buildup of cholesterol and fat  (plaque) in the arteries of the heart. The blockage in the arteries deprives the heart muscle of oxygen and blood. This can cause chest pain and may lead to a heart attack. High blood pressure can also contribute to CAD.  Heart attack (myocardial infarction). A heart attack occurs when one or more arteries in the heart become blocked. The loss of oxygen damages the muscle tissue of the heart. When this happens, part of the heart muscle dies. The injured tissue does not contract as well and weakens the heart's ability to pump blood.  Abnormal heart valves. When the heart valves do not open and close properly, it can cause heart failure. This makes the heart muscle pump harder to keep the blood flowing.  Heart muscle disease (cardiomyopathy or myocarditis). Heart muscle disease is damage to the heart muscle from a variety of causes. These can include drug or alcohol abuse, infections, or unknown reasons. These can increase the risk of heart  failure.  Lung disease. Lung disease makes the heart work harder because the lungs do not work properly. This can cause a strain on the heart, leading it to fail.  Diabetes. Diabetes increases the risk of heart failure. High blood sugar contributes to high fat (lipid) levels in the blood. Diabetes can also cause slow damage to tiny blood vessels that carry important nutrients to the heart muscle. When the heart does not get enough oxygen and food, it can cause the heart to become weak and stiff. This leads to a heart that does not contract efficiently.  Other conditions can contribute to heart failure. These include abnormal heart rhythms, thyroid problems, and low blood counts (anemia). Certain unhealthy behaviors can increase the risk of heart failure, including:  Being overweight.  Smoking or chewing tobacco.  Eating foods high in fat and cholesterol.  Abusing illicit drugs or alcohol.  Lacking physical activity. SYMPTOMS  Heart failure symptoms may vary  and can be hard to detect. Symptoms may include:  Shortness of breath with activity, such as climbing stairs.  Persistent cough.  Swelling of the feet, ankles, legs, or abdomen.  Unexplained weight gain.  Difficulty breathing when lying flat (orthopnea).  Waking from sleep because of the need to sit up and get more air.  Rapid heartbeat.  Fatigue and loss of energy.  Feeling light-headed, dizzy, or close to fainting.  Loss of appetite.  Nausea.  Increased urination during the night (nocturia). DIAGNOSIS  A diagnosis of heart failure is based on your history, symptoms, physical examination, and diagnostic tests. Diagnostic tests for heart failure may include:  Echocardiography.  Electrocardiography.  Chest X-ray.  Blood tests.  Exercise stress test.  Cardiac angiography.  Radionuclide scans. TREATMENT  Treatment is aimed at managing the symptoms of heart failure. Medicines, behavioral changes, or surgical intervention may be necessary to treat heart failure.  Medicines to help treat heart failure may include:  Angiotensin-converting enzyme (ACE) inhibitors. This type of medicine blocks the effects of a blood protein called angiotensin-converting enzyme. ACE inhibitors relax (dilate) the blood vessels and help lower blood pressure.  Angiotensin receptor blockers (ARBs). This type of medicine blocks the actions of a blood protein called angiotensin. Angiotensin receptor blockers dilate the blood vessels and help lower blood pressure.  Water pills (diuretics). Diuretics cause the kidneys to remove salt and water from the blood. The extra fluid is removed through urination. This loss of extra fluid lowers the volume of blood the heart pumps.  Beta blockers. These prevent the heart from beating too fast and improve heart muscle strength.  Digitalis. This increases the force of the heartbeat.  Healthy behavior changes include:  Obtaining and maintaining a healthy  weight.  Stopping smoking or chewing tobacco.  Eating heart-healthy foods.  Limiting or avoiding alcohol.  Stopping illicit drug use.  Physical activity as directed by your health care provider.  Surgical treatment for heart failure may include:  A procedure to open blocked arteries, repair damaged heart valves, or remove damaged heart muscle tissue.  A pacemaker to improve heart muscle function and control certain abnormal heart rhythms.  An internal cardioverter defibrillator to treat certain serious abnormal heart rhythms.  A left ventricular assist device (LVAD) to assist the pumping ability of the heart. HOME CARE INSTRUCTIONS   Take medicines only as directed by your health care provider. Medicines are important in reducing the workload of your heart, slowing the progression of heart failure, and improving your symptoms.  Do not stop  taking your medicine unless directed by your health care provider.  Do not skip any dose of medicine.  Refill your prescriptions before you run out of medicine. Your medicines are needed every day.  Engage in moderate physical activity if directed by your health care provider. Moderate physical activity can benefit some people. The elderly and people with severe heart failure should consult with a health care provider for physical activity recommendations.  Eat heart-healthy foods. Food choices should be free of trans fat and low in saturated fat, cholesterol, and salt (sodium). Healthy choices include fresh or frozen fruits and vegetables, fish, lean meats, legumes, fat-free or low-fat dairy products, and whole grain or high fiber foods. Talk to a dietitian to learn more about heart-healthy foods.  Limit sodium if directed by your health care provider. Sodium restriction may reduce symptoms of heart failure in some people. Talk to a dietitian to learn more about heart-healthy seasonings.  Use healthy cooking methods. Healthy cooking methods  include roasting, grilling, broiling, baking, poaching, steaming, or stir-frying. Talk to a dietitian to learn more about healthy cooking methods.  Limit fluids if directed by your health care provider. Fluid restriction may reduce symptoms of heart failure in some people.  Weigh yourself every day. Daily weights are important in the early recognition of excess fluid. You should weigh yourself every morning after you urinate and before you eat breakfast. Wear the same amount of clothing each time you weigh yourself. Record your daily weight. Provide your health care provider with your weight record.  Monitor and record your blood pressure if directed by your health care provider.  Check your pulse if directed by your health care provider.  Lose weight if directed by your health care provider. Weight loss may reduce symptoms of heart failure in some people.  Stop smoking or chewing tobacco. Nicotine makes your heart work harder by causing your blood vessels to constrict. Do not use nicotine gum or patches before talking to your health care provider.  Keep all follow-up visits as directed by your health care provider. This is important.  Limit alcohol intake to no more than 1 drink per day for nonpregnant women and 2 drinks per day for men. One drink equals 12 ounces of beer, 5 ounces of wine, or 1 ounces of hard liquor. Drinking more than that is harmful to your heart. Tell your health care provider if you drink alcohol several times a week. Talk with your health care provider about whether alcohol is safe for you. If your heart has already been damaged by alcohol or you have severe heart failure, drinking alcohol should be stopped completely.  Stop illicit drug use.  Stay up-to-date with immunizations. It is especially important to prevent respiratory infections through current pneumococcal and influenza immunizations.  Manage other health conditions such as hypertension, diabetes, thyroid  disease, or abnormal heart rhythms as directed by your health care provider.  Learn to manage stress.  Plan rest periods when fatigued.  Learn strategies to manage high temperatures. If the weather is extremely hot:  Avoid vigorous physical activity.  Use air conditioning or fans or seek a cooler location.  Avoid caffeine and alcohol.  Wear loose-fitting, lightweight, and light-colored clothing.  Learn strategies to manage cold temperatures. If the weather is extremely cold:  Avoid vigorous physical activity.  Layer clothes.  Wear mittens or gloves, a hat, and a scarf when going outside.  Avoid alcohol.  Obtain ongoing education and support as needed.  Participate in  or seek rehabilitation as needed to maintain or improve independence and quality of life. SEEK MEDICAL CARE IF:   You have a rapid weight gain.  You have increasing shortness of breath that is unusual for you.  You are unable to participate in your usual physical activities.  You tire easily.  You cough more than normal, especially with physical activity.  You have any or more swelling in areas such as your hands, feet, ankles, or abdomen.  You are unable to sleep because it is hard to breathe.  You feel like your heart is beating fast (palpitations).  You become dizzy or light-headed upon standing up. SEEK IMMEDIATE MEDICAL CARE IF:   You have difficulty breathing.  There is a change in mental status such as decreased alertness or difficulty with concentration.  You have a pain or discomfort in your chest.  You have an episode of fainting (syncope). MAKE SURE YOU:   Understand these instructions.  Will watch your condition.  Will get help right away if you are not doing well or get worse.   This information is not intended to replace advice given to you by your health care provider. Make sure you discuss any questions you have with your health care provider.   Document Released:  01/16/2005 Document Revised: 06/02/2014 Document Reviewed: 02/16/2012 Elsevier Interactive Patient Education [employer].

## 2015-02-12 NOTE — Consult Note (Signed)
   [facility] Inpatient Consult   02/12/2015  Celvin Taney [facility] 12/27/1945 [id number] Met with patient who is likely to discharge back home later today.  This patient has been active with [facility].   Patient endorses ongoing follow up with [facility].  His affect remains and when asked if there were any concerns or changes needed he states, "No, everything is the same."  Will continue to have community nurse follow up.  For questions, contact: Natividad Brood, RN BSN [facility] Liaison  [phone] business mobile phone

## 2015-02-12 NOTE — Telephone Encounter (Signed)
Spoke with patient re appointments re 1/19 and confirmed ct 1/17. Patient to get new schedule 1/19.
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
## 2015-02-15 ENCOUNTER — Other Ambulatory Visit: Payer: Self-pay

## 2015-02-15 NOTE — Patient Outreach (Addendum)
Patient was discharged from [facility] on February 12, 2015.  Initial transition of care call made for assessment of community care coordination. Spoke with patient who identified himself by providing date of birth and address.  Patent and this RNCM collaborated to create his initial case management care plan.  Patient did not have post acute care appointment with his primary care physician. Patient requested assistance with scheduling.  Call made to [facility] [phone], left message for Amy to schedule appointment.    Plan: Home visit on January 24 to further discuss community care coordination needs. Initial Heart failure education, management

## 2015-02-16 ENCOUNTER — Encounter (HOSPITAL_COMMUNITY): Payer: Self-pay

## 2015-02-16 ENCOUNTER — Ambulatory Visit (HOSPITAL_COMMUNITY)
Admission: RE | Admit: 2015-02-16 | Discharge: 2015-02-16 | Disposition: A | Discharge status: Home / Self Care | Payer: Commercial Managed Care - HMO | Source: Ambulatory Visit | Attending: Internal Medicine | Admitting: Internal Medicine

## 2015-02-16 DIAGNOSIS — M5136 Other intervertebral disc degeneration, lumbar region: Secondary | ICD-10-CM | POA: Insufficient documentation

## 2015-02-16 DIAGNOSIS — R161 Splenomegaly, not elsewhere classified: Secondary | ICD-10-CM | POA: Insufficient documentation

## 2015-02-16 DIAGNOSIS — J479 Bronchiectasis, uncomplicated: Secondary | ICD-10-CM | POA: Diagnosis not present

## 2015-02-16 DIAGNOSIS — J439 Emphysema, unspecified: Secondary | ICD-10-CM | POA: Diagnosis not present

## 2015-02-16 DIAGNOSIS — R599 Enlarged lymph nodes, unspecified: Secondary | ICD-10-CM | POA: Diagnosis not present

## 2015-02-16 DIAGNOSIS — K5730 Diverticulosis of large intestine without perforation or abscess without bleeding: Secondary | ICD-10-CM | POA: Diagnosis not present

## 2015-02-16 DIAGNOSIS — M47816 Spondylosis without myelopathy or radiculopathy, lumbar region: Secondary | ICD-10-CM | POA: Insufficient documentation

## 2015-02-16 DIAGNOSIS — I517 Cardiomegaly: Secondary | ICD-10-CM | POA: Diagnosis not present

## 2015-02-16 DIAGNOSIS — K429 Umbilical hernia without obstruction or gangrene: Secondary | ICD-10-CM | POA: Diagnosis not present

## 2015-02-16 DIAGNOSIS — Z85118 Personal history of other malignant neoplasm of bronchus and lung: Secondary | ICD-10-CM | POA: Insufficient documentation

## 2015-02-16 DIAGNOSIS — I5031 Acute diastolic (congestive) heart failure: Secondary | ICD-10-CM

## 2015-02-16 DIAGNOSIS — I700 Atherosclerosis of aorta: Secondary | ICD-10-CM | POA: Insufficient documentation

## 2015-02-16 DIAGNOSIS — Z66 Do not resuscitate: Secondary | ICD-10-CM

## 2015-02-16 DIAGNOSIS — C3411 Malignant neoplasm of upper lobe, right bronchus or lung: Secondary | ICD-10-CM

## 2015-02-16 MED ORDER — IOHEXOL 300 MG/ML  SOLN
50.0000 mL | Freq: Once | INTRAMUSCULAR | Status: AC | PRN
Start: 2015-02-16 — End: 2015-02-16
  Administered 2015-02-16: 50 mL via ORAL

## 2015-02-18 ENCOUNTER — Ambulatory Visit (HOSPITAL_COMMUNITY)
Admission: RE | Admit: 2015-02-18 | Discharge: 2015-02-18 | Disposition: A | Discharge status: Home / Self Care | Payer: Commercial Managed Care - HMO | Source: Ambulatory Visit | Attending: Nurse Practitioner | Admitting: Nurse Practitioner

## 2015-02-18 ENCOUNTER — Ambulatory Visit: Payer: Commercial Managed Care - HMO

## 2015-02-18 ENCOUNTER — Other Ambulatory Visit (HOSPITAL_BASED_OUTPATIENT_CLINIC_OR_DEPARTMENT_OTHER): Payer: Commercial Managed Care - HMO

## 2015-02-18 ENCOUNTER — Telehealth: Payer: Self-pay | Admitting: Medical Oncology

## 2015-02-18 ENCOUNTER — Ambulatory Visit (HOSPITAL_BASED_OUTPATIENT_CLINIC_OR_DEPARTMENT_OTHER): Payer: Commercial Managed Care - HMO | Admitting: Nurse Practitioner

## 2015-02-18 VITALS — BP 91/53 | HR 75 | Temp 97.7°F | Resp 18 | Ht 69.0 in | Wt 261.9 lb

## 2015-02-18 DIAGNOSIS — C3431 Malignant neoplasm of lower lobe, right bronchus or lung: Secondary | ICD-10-CM | POA: Diagnosis not present

## 2015-02-18 DIAGNOSIS — R609 Edema, unspecified: Secondary | ICD-10-CM

## 2015-02-18 DIAGNOSIS — R600 Localized edema: Secondary | ICD-10-CM | POA: Insufficient documentation

## 2015-02-18 DIAGNOSIS — I509 Heart failure, unspecified: Secondary | ICD-10-CM | POA: Diagnosis not present

## 2015-02-18 DIAGNOSIS — E876 Hypokalemia: Secondary | ICD-10-CM

## 2015-02-18 DIAGNOSIS — M7989 Other specified soft tissue disorders: Secondary | ICD-10-CM | POA: Diagnosis not present

## 2015-02-18 DIAGNOSIS — C3490 Malignant neoplasm of unspecified part of unspecified bronchus or lung: Secondary | ICD-10-CM

## 2015-02-18 DIAGNOSIS — C3411 Malignant neoplasm of upper lobe, right bronchus or lung: Secondary | ICD-10-CM

## 2015-02-18 DIAGNOSIS — E8809 Other disorders of plasma-protein metabolism, not elsewhere classified: Secondary | ICD-10-CM

## 2015-02-18 DIAGNOSIS — I5031 Acute diastolic (congestive) heart failure: Secondary | ICD-10-CM

## 2015-02-18 DIAGNOSIS — N189 Chronic kidney disease, unspecified: Secondary | ICD-10-CM | POA: Diagnosis not present

## 2015-02-18 DIAGNOSIS — Z66 Do not resuscitate: Secondary | ICD-10-CM

## 2015-02-18 DIAGNOSIS — E46 Unspecified protein-calorie malnutrition: Secondary | ICD-10-CM

## 2015-02-18 LAB — COMPREHENSIVE METABOLIC PANEL
ALBUMIN: 2.8 g/dL — AB (ref 3.5–5.0)
ALK PHOS: 65 U/L (ref 40–150)
ALT: 15 U/L (ref 0–55)
ANION GAP: 10 meq/L (ref 3–11)
AST: 19 U/L (ref 5–34)
BUN: 59.2 mg/dL — AB (ref 7.0–26.0)
CALCIUM: 8.4 mg/dL (ref 8.4–10.4)
CO2: 27 mEq/L (ref 22–29)
Chloride: 102 mEq/L (ref 98–109)
Creatinine: 1.8 mg/dL — ABNORMAL HIGH (ref 0.7–1.3)
EGFR: 43 mL/min/{1.73_m2} — AB (ref >90)
Glucose: 82 mg/dl (ref 70–140)
POTASSIUM: 3 meq/L — AB (ref 3.5–5.1)
Sodium: 139 mEq/L (ref 136–145)
Total Bilirubin: 0.63 mg/dL (ref 0.20–1.20)
Total Protein: 6.1 g/dL — ABNORMAL LOW (ref 6.4–8.3)

## 2015-02-18 LAB — CBC WITH DIFFERENTIAL/PLATELET
BASO%: 0 % (ref 0.0–2.0)
BASOS ABS: 0 10*3/uL (ref 0.0–0.1)
EOS ABS: 0 10*3/uL (ref 0.0–0.5)
EOS%: 0.2 % (ref 0.0–7.0)
HEMATOCRIT: 38.9 % (ref 38.4–49.9)
HGB: 11.8 g/dL — ABNORMAL LOW (ref 13.0–17.1)
LYMPH#: 0.8 10*3/uL — AB (ref 0.9–3.3)
LYMPH%: 15 % (ref 14.0–49.0)
MCH: 28.4 pg (ref 27.2–33.4)
MCHC: 30.3 g/dL — AB (ref 32.0–36.0)
MCV: 93.5 fL (ref 79.3–98.0)
MONO#: 0.9 10*3/uL (ref 0.1–0.9)
MONO%: 16.1 % — ABNORMAL HIGH (ref 0.0–14.0)
NEUT#: 3.8 10*3/uL (ref 1.5–6.5)
NEUT%: 68.7 % (ref 39.0–75.0)
Platelets: 233 10*3/uL (ref 140–400)
RBC: 4.16 10*6/uL — ABNORMAL LOW (ref 4.20–5.82)
RDW: 19 % — ABNORMAL HIGH (ref 11.0–14.6)
WBC: 5.5 10*3/uL (ref 4.0–10.3)

## 2015-02-18 MED ORDER — POTASSIUM CHLORIDE CRYS ER 20 MEQ PO TBCR: 20.0000 meq | EXTENDED_RELEASE_TABLET | Freq: Every day | ORAL | Status: AC

## 2015-02-18 MED ORDER — POTASSIUM CHLORIDE CRYS ER 20 MEQ PO TBCR
40.0000 meq | EXTENDED_RELEASE_TABLET | Freq: Two times a day (BID) | ORAL | Status: DC
  Filled 2015-02-18: qty 2

## 2015-02-18 NOTE — Telephone Encounter (Signed)
Family member notified

## 2015-02-18 NOTE — Progress Notes (Signed)
*  Preliminary Results* Left lower extremity venous duplex completed. Left lower extremity is negative for deep vein thrombosis. There is no evidence of left Baker's cyst. Left lower extremity veins exhibit pulsatile flow by spectral doppler, suggestive of possible elevated right sided heart pressure.  02/18/2015 11:32 AM  Maudry Mayhew, RVT, RDCS, RDMS

## 2015-02-18 NOTE — Progress Notes (Signed)
Quick Note:  Call patient with the result and order KCl 20 mEq by mouth daily for 10 days. ______

## 2015-02-18 NOTE — Telephone Encounter (Signed)
-----   Message from Curt Bears, MD sent at 02/18/2015  9:50 AM EST ----- Call patient with the result and order KCl 20 mEq by mouth daily for 10 days.

## 2015-02-19 ENCOUNTER — Encounter: Payer: Self-pay | Admitting: Nurse Practitioner

## 2015-02-19 DIAGNOSIS — N189 Chronic kidney disease, unspecified: Secondary | ICD-10-CM | POA: Insufficient documentation

## 2015-02-19 DIAGNOSIS — E46 Unspecified protein-calorie malnutrition: Secondary | ICD-10-CM | POA: Insufficient documentation

## 2015-02-19 DIAGNOSIS — E876 Hypokalemia: Secondary | ICD-10-CM | POA: Insufficient documentation

## 2015-02-19 DIAGNOSIS — E8809 Other disorders of plasma-protein metabolism, not elsewhere classified: Secondary | ICD-10-CM | POA: Insufficient documentation

## 2015-02-19 NOTE — Assessment & Plan Note (Signed)
Patient received cycle 5 of his Nivolumab immunotherapy on 02/04/2015.  He was scheduled to receive cycle 6 of the same immunotherapy regimen; but decision was made to hold the immunotherapy infusion today secondary to apparent CHF exacerbation.  Blood counts obtained today were within normal limits.  Patient's blood pressure was low at 91/53; the patient was afebrile.  Is also noted.  The patient has canned approximate 14 pounds in the last 6 days.  All of these details were reviewed with Dr. Zenia Resides nurse today.  Patient has plans to follow-up with Dr.Harwani later this afternoon.  Also, patient underwent a restaging CT with contrast of the chest/abdomen/pelvis January 17th 2017 which revealed:  IMPRESSION: 1. Stable mild adenopathy in the chest and stable airspace opacity in the right lower lobe. 2. Coronary, aortic arch, and branch vessel atherosclerotic vascular disease. Moderate cardiomegaly. 3. Mild retroperitoneal adenopathy including a 1.2 cm periaortic lymph node (formerly 0.9 cm). 4. No definite bony lesion identified. 5. Mild splenomegaly. 6. Diffuse mesenteric and subcutaneous edema, suspicious for third spacing of fluid. 7. Other imaging findings of potential clinical significance: Prominent main pulmonary arteries suspicious for pulmonary arterial hypertension. 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 Emphysema. Sigmoid diverticulosis. Aortoiliac atherosclerotic vascular disease.  Patient has plans to follow-up with his cardiologist later this afternoon.  He is scheduled to see his primary/pulmonary physician on 02/26/2015.  Chemotherapy will be held today; and patient will return for labs, visit, and his next cycle of immunotherapy on 03/04/2015.  He knows to call in the interim with any new or worsening concerns.

## 2015-02-19 NOTE — Assessment & Plan Note (Signed)
Patient has history of chronic renal insufficiency.  Creatinine has increased from 1.5 up to 1.8.  Will continue to monitor closely.

## 2015-02-19 NOTE — Assessment & Plan Note (Signed)
Potassium was down to the 3.0 today.  Patient states that he has potassium tablets at home; but has not been taking them.  Patient was given potassium 40 mEq orally while at the [facility] today.  Also, patient was advised to resume taking the potassium 20 mEq twice daily for a total of one week; and then he may return to only taking the potassium 20 mEq once daily.  Will need to continue monitoring patient's potassium level; since he is taking chronic diuretics.

## 2015-02-19 NOTE — Assessment & Plan Note (Addendum)
Patient has a history of chronic congestive heart failure.  He is followed closely per Dr. Terrence Dupont cardiologist.  Patient was admitted to the hospital with an acute exacerbation of CHF from 02/06/2015 through 02/12/2015.  Patient returned to the [facility] today for follow-up and for planned cycle 6 of his Nivolumab immunotherapy.  Patient states that he continues on 2 L nasal cannula on a 24/7 basis.  He denies any worsening issues with shortness of breath, chest pain, chest pressure, pain with inspiration.  He notes an approximate 14 pound weight gain in the last 6 days.-Since his discharge from the hospital.  He continues to take both torsemide and Zarolxolyn diarrhetic as previously directed.  Patient is noted increased edema to his bilateral lower extremities; with the left slightly larger than the right.  Exam today revealed bilateral breath sounds essentially clear with no all for wheeze.  No respiratory distress noted.  O2 sat 100% on 2 L nasal cannula.  However, it is noted that patient's blood pressure was initially 91/53.  Patient confirmed that he did take his diarrhetic medications earlier this morning.  Also, patient was noted to have +3 pitting edema to bilateral lower extremities; with the left much greater than the right.  Doppler ultrasound of the left lower extremity obtained today was negative for DVT.  Dr. Julien Nordmann reviewed patient's restaging scan with patient and his son today.  See further notes for details.  Patient has an appointment with his cardiologist for later this afternoon.  Called and reviewed all findings with Dr. Zenia Resides nurse, Almyra Free.  Patient was advised to go directly to the emergency department if he develops any worsening symptoms whatsoever.
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
## 2015-02-19 NOTE — Assessment & Plan Note (Signed)
Albumen remains low at 2.8.  Patient was encouraged to push protein in his diet is much as possible.  Of note-patient continued, chronic bilateral lower extremity edema could also be secondary to hypoalbuminemia as well; but, primary concern would be in regards to CHF.

## 2015-02-23 ENCOUNTER — Other Ambulatory Visit: Payer: Self-pay

## 2015-02-23 ENCOUNTER — Ambulatory Visit: Payer: Commercial Managed Care - HMO

## 2015-02-23 ENCOUNTER — Telehealth: Payer: Self-pay | Admitting: *Deleted

## 2015-02-23 NOTE — Telephone Encounter (Signed)
TC to pt home phone LM for rtn call with any concerns or questions. Following up from [facility] visit on 02/18/15

## 2015-02-24 NOTE — Patient Outreach (Signed)
[facility]) Care Management  02/24/2015  [place] [birth year] [id number]   Home visit today to discuss advanced planning and educate on HF. Patient stated he is not in the mood to talk about advanced planning today, his son is not present.  Patient stated he does want to talk about advanced planning more during the next visit.   Patient was wearing his oxygen.  Patient states he does not have any increased shortness of breath but described his breathing as not being any worse.   Patient states she weighs daily and records.  Weights  Were noted to have been recorded and no acute care gains.  Plan: Home visit on February 7 for continued [facility]

## 2015-02-26 ENCOUNTER — Encounter: Payer: Self-pay | Admitting: Internal Medicine

## 2015-02-26 ENCOUNTER — Ambulatory Visit (INDEPENDENT_AMBULATORY_CARE_PROVIDER_SITE_OTHER): Payer: Commercial Managed Care - HMO | Admitting: Internal Medicine

## 2015-02-26 VITALS — BP 110/70 | HR 69 | Ht 69.0 in | Wt 256.0 lb

## 2015-02-26 DIAGNOSIS — J439 Emphysema, unspecified: Secondary | ICD-10-CM | POA: Diagnosis not present

## 2015-02-26 DIAGNOSIS — J9611 Chronic respiratory failure with hypoxia: Secondary | ICD-10-CM

## 2015-02-26 DIAGNOSIS — Z5181 Encounter for therapeutic drug level monitoring: Secondary | ICD-10-CM

## 2015-02-26 MED ORDER — IPRATROPIUM-ALBUTEROL 0.5-2.5 (3) MG/3ML IN SOLN: 3.0000 mL | Freq: Four times a day (QID) | RESPIRATORY_TRACT | Status: AC

## 2015-02-26 NOTE — Progress Notes (Signed)
Subjective:     Patient ID: [name], male   DOB: 05-30-1945, 70 y.o.   MRN: [id number]  HPI   OV 10/31/2013  Chief Complaint  Patient presents with  . Acute Visit    [place] pt. Pt c/o DOE since lung biopsy in 2013. Pt states he only coughs 1-2 times per day and brings up yellow mucus. Pt denies CP/tightness.    Here for acute visit but for chronic dyspnea. Switching PCCM providers from Dr Gwenette Greet to Dr Chase Caller   11/21/11 - LLL  adenoca  - CT shows BAC like pattern. Confirmed on TTbx and then subsequent LLL lobectomy. Diagnosed with Stage 2B T3, NO, Mo NSCLC (tumor size 11.5cm) and then Rx with cisplaint and limta thrugh April 2014 followed by 6 cycles of paclitaxel and avastin through early to mid 2015. His most recent CT Aug 2014 did not show disease progression but hasd some increase in mediastinal nodes  His main issue that he is having significant dyspnea - class 2-3 with exertion ad relieved by rest since lobectomy 2 years ago in Oct 2013.  He is says overall it is unchanged. Moderate in severity but sometimes severe in severity. Clearly exertional. Rest helps. CHronic o2 Rx x 2 years  Not helping. Says cardiac status is optimal per his understanding through Dr Terrence Dupont. He is frustrated. Past few months thinks dyspnea might be wrose. Past few weeks noticing random wheezing. No associted cough, orthopnea, pnd, hemoptysis  He did see Dr Gwenette Greet in dec 2014 who tried him on stiolto and explained multifactorial nature of dyspnea but he does not remember.  Other dyspnea relevant details   - Obese : Body mass index is 40.81 kg/(m^2).Marland Kitchen No formal dx of OSA but at risk - s/p lobectomy 2013 : Spirometry 01/22/13:  - moderate obstruction - fev1 1.9L/62%, RAtio 64. Currently untreated other than 02  - Presumed diastolic cHf based on med list and normal echo may 2014 - sees DR Lona Kettle- on non specific beta blocker Coreg - DEconditioned- does only limited ADLs at home - gets dyspneic. Never been  to rehab post chemo or lobectomy    11/21/2013 Follow up    LLL  adenoca  - CT shows BAC like pattern. Confirmed on TTbx and then subsequent LLL lobectomy. Diagnosed with Stage 2B T3, NO, Mo NSCLC (tumor size 11.5cm) and then Rx with cisplaint and limta thrugh April 2014 followed by 6 cycles of paclitaxel and avastin through early to mid 2015. His most recent CT Aug 2015 did not show disease progression but hasd some increase in mediastinal nodes  Pt returns for follow up . Seen 3 weeks ago for increased cough and shortness of breath over last 2 years, (increased after lung surgery)  Pt was started on Flovent and Spiriva  And given prednisone taper for presumed COPD flare . He says he felt some better but was unable to get inhalers at pharmacy, does not think they are covered. He complains thet he still has cough and intermittent wheezes.  Spirometry today shows restrictive process with FVC at 53% FEV1 51 and ratio 74.Decreased mid flows .  Of note he remains on ACE inhibitor and non selective beta blocker.  Denies chest pain , orthopnea,  N/v/d, overt reflux, fever or discolored mucus .  He has chronic leg swelling , lasix '160mg'$  daily   REC Try Spiriva Handihaler 1 puff daily  Call back if not covered by your insurance .  Stop Altace .  Begin  Diovan '160mg'$  daily  follow up Dr. Terrence Dupont in 3 weeks for blood pressure check .  Follow up Dr. Chase Caller in 6 weeks and As needed   Please contact office for sooner follow up if symptoms do not improve or worsen or seek emergency care     OV 01/02/2014  Chief Complaint  Patient presents with  . Follow-up    Pt recently admitted to [facility] for CHF. Pt states his breathing has improved since the last OV. Pt denies cough and Cp/tightness.     Follow-up chronic respiratory failure with chronic dyspnea class 3 that is multifactorial from lobectomy, obesity, deconditioning, COPD and chronic diastolic heart failure   - He saw my nurse practitioner  last 11/21/2013. Then shortly before Thanksgiving 2015 he was admitted for heart failure exacerbation and has been diuresed and since then has been feeling much better. Overall since first seeing me in the fall of 2015 he says that his dyspnea is better. No longer in a  Wheelchair. He is more condition and is able to walk with oxygen. He satisfied with his current health status. He says he is compliant with his oxygen and Spiriva. There are no new issues.  - In terms of his cancer he underwent CT scan of the chest 12/29/2013 and in review of Dr. Arvilla Market physician assistant l Johnson's note It appears that cancer is under remission and he is on observation therapy   OV 02/26/2015  Chief Complaint  Patient presents with  . Follow-up    Pt recently d/c from [facility] for CHF. Pt states his breahting has improved since d/c and is currently at baseline. Pt deneis significant cough and CP/tightness.    Follow-up chronic hypoxemic respiratory failure due to lobectomy, obesity, deconditioning, COPD not otherwise specified and chronic diastolic heart failure  Last seen December 2015. It appears based on chart review that his lung cancer is back since then he has completed 5 cycles of immunomodulatory Nivolumab treatment. In the interim earlier this month in January 2017 he had a heart failure exacerbation and was admitted. He is back at home now. He now presents with her son for follow-up. Says respiratory status is stable. At home he does all his ADLs but anything outside the house he needs a wheelchair. He gets dyspneic. He does have edema but he says this is baseline. Medication review shows that he is only taking Xopenex when necessary but he says he uses 3 times a day. He is not aware of DuoNeb. He does not remember Spiriva or any inhalers. He has no idea about his baseline COPD inhalers. He had a CT chest 02/16/2015 and does not show any evidence of interstitial lung disease related to his immunomodulators  chemotherapy    CAT COPD Symptom & Quality of Life Score ([place]) 0 is no burden. 5 is highest burden 02/26/2015   Never Cough -> Cough all the time 3  No phlegm in chest -> Chest is full of phlegm 2  No chest tightness -> Chest feels very tight 0  No dyspnea for 1 flight stairs/hill -> Very dyspneic for 1 flight of stairs 5  No limitations for ADL at home -> Very limited with ADL at home 2  Confident leaving home -> Not at all confident leaving home 0  Sleep soundly -> Do not sleep soundly because of lung condition 0  Lots of Energy -> No energy at all 3  TOTAL Score (max 40)  15  Current outpatient prescriptions:  .  aspirin EC 81 MG tablet, Take 81 mg by mouth daily., Disp: , Rfl:  .  digoxin 62.5 MCG TABS, Take 0.0625 mg by mouth daily., Disp: 30 tablet, Rfl: 2 .  levalbuterol (XOPENEX) 1.25 MG/0.5ML nebulizer solution, Take 1.25 mg by nebulization 3 (three) times daily., Disp: 1 each, Rfl: 12 .  metolazone (ZAROXOLYN) 2.5 MG tablet, Take 1 tablet (2.5 mg total) by mouth daily., Disp: 30 tablet, Rfl: 3 .  potassium chloride SA (K-DUR,KLOR-CON) 20 MEQ tablet, Take 1 tablet (20 mEq total) by mouth daily., Disp: 10 tablet, Rfl: 0 .  predniSONE (DELTASONE) 20 MG tablet, Take 1 tablet (20 mg total) by mouth daily before breakfast., Disp: 30 tablet, Rfl: 0 .  torsemide (DEMADEX) 10 MG tablet, Take 1 tablet (10 mg total) by mouth daily., Disp: 60 tablet, Rfl: 2 No current facility-administered medications for this visit.  Facility-Administered Medications Ordered in Other Visits:  .  sodium chloride 0.9 % injection 10 mL, 10 mL, Intracatheter, PRN, Curt Bears, MD, 10 mL at 09/03/12 1534  Immunization History  Administered Date(s) Administered  . Influenza Split 01/04/2015  . Influenza Whole 11/19/2011  . Influenza, High Dose Seasonal PF 10/20/2013  . Influenza,inj,Quad PF,36+ Mos 12/05/2012, 04/12/2014  . Pneumococcal Polysaccharide-23 11/17/2011  .  Pneumococcal-Unspecified 01/04/2015    No Known Allergies   Review of Systems Per HPI    Objective:   Physical Exam  Constitutional: He is oriented to person, place, and time. He appears well-developed and well-nourished. No distress.  obse Sitting in wheel chair O2 on  HENT:  Head: Normocephalic and atraumatic.  Right Ear: External ear normal.  Left Ear: External ear normal.  Mouth/Throat: Oropharynx is clear and moist. No oropharyngeal exudate.  Eyes: Conjunctivae and EOM are normal. Pupils are equal, round, and reactive to light. Right eye exhibits no discharge. Left eye exhibits no discharge. No scleral icterus.  Neck: Normal range of motion. Neck supple. No JVD present. No tracheal deviation present. No thyromegaly present.  Cardiovascular: Normal rate, regular rhythm and intact distal pulses.  Exam reveals no gallop and no friction rub.   No murmur heard. Pulmonary/Chest: Effort normal and breath sounds normal. No respiratory distress. He has no wheezes. He has no rales. He exhibits no tenderness.  Abdominal: Soft. Bowel sounds are normal. He exhibits no distension and no mass. There is no tenderness. There is no rebound and no guarding.  Musculoskeletal: Normal range of motion. He exhibits edema. He exhibits no tenderness.  Lymphadenopathy:    He has no cervical adenopathy.  Neurological: He is alert and oriented to person, place, and time. He has normal reflexes. No cranial nerve deficit. Coordination normal.  Skin: Skin is warm and dry. No rash noted. He is not diaphoretic. No erythema. No pallor.  Psychiatric: He has a normal mood and affect. His behavior is normal. Judgment and thought content normal.  Nursing note and vitals reviewed.  Filed Vitals:   02/26/15 1534  BP: 110/70  Pulse: 69  Height: '5\' 9"'$  (1.753 m)  Weight: 256 lb (116.121 kg)  SpO2: 92%        Assessment:       ICD-9-CM ICD-10-CM   1. Chronic respiratory failure with hypoxia (HCC) 518.83  J96.11    799.02    2. Pulmonary emphysema, unspecified emphysema type ([place]) 492.8 J43.9   3. Encounter for therapeutic drug monitoring V58.83 Z51.81         Plan:  COPD and emphysema appears stable but it appears you are not on the appropriate copd med regimen No evidence of nivolumab related pulmonary toxicity though you are entering at risk stage I am worried that you do not know what medications you are on  PLAN Continue oxygen Do xopenex neb only as needed sttart duoneb 4 times daily   Followup  - next few weeks for med calendar with NP TAmmy   Dr. Brand Males, M.D., [facility].C.P Pulmonary and Critical Care Medicine Staff Physician [place] [facility] Pager: [phone], If no answer or between  15:00h - 7:00h: call [phone]  02/26/2015 4:12 PM

## 2015-02-26 NOTE — Patient Instructions (Addendum)
ICD-9-CM ICD-10-CM   1. Chronic respiratory failure with hypoxia (HCC) 518.83 J96.11    799.02    2. Pulmonary emphysema, unspecified emphysema type ([place]) 492.8 J43.9   3. Encounter for therapeutic drug monitoring V58.83 Z51.81     COPD and emphysema appears stable but it appears you are not on the appropriate copd med regimen No evidence of nivolumab related pulmonary toxicity though you are entering at risk stage I am worried that you do not know what medications you are on  PLAN Continue oxygen Do xopenex neb only as needed sttart duoneb 4 times daily   Followup  - next few weeks for med calendar with NP TAmmy

## 2015-03-04 ENCOUNTER — Ambulatory Visit (HOSPITAL_BASED_OUTPATIENT_CLINIC_OR_DEPARTMENT_OTHER): Payer: Commercial Managed Care - HMO | Admitting: Internal Medicine

## 2015-03-04 ENCOUNTER — Telehealth: Payer: Self-pay | Admitting: *Deleted

## 2015-03-04 ENCOUNTER — Telehealth: Payer: Self-pay | Admitting: Internal Medicine

## 2015-03-04 ENCOUNTER — Other Ambulatory Visit (HOSPITAL_BASED_OUTPATIENT_CLINIC_OR_DEPARTMENT_OTHER): Payer: Commercial Managed Care - HMO

## 2015-03-04 ENCOUNTER — Emergency Department (HOSPITAL_COMMUNITY)
Admission: EM | Admit: 2015-03-04 | Discharge: 2015-03-04 | Disposition: A | Discharge status: Home / Self Care | Payer: Commercial Managed Care - HMO | Attending: Emergency Medicine | Admitting: Emergency Medicine

## 2015-03-04 ENCOUNTER — Ambulatory Visit (HOSPITAL_BASED_OUTPATIENT_CLINIC_OR_DEPARTMENT_OTHER): Payer: Commercial Managed Care - HMO

## 2015-03-04 ENCOUNTER — Encounter (HOSPITAL_COMMUNITY): Payer: Self-pay | Admitting: *Deleted

## 2015-03-04 ENCOUNTER — Encounter: Payer: Self-pay | Admitting: Internal Medicine

## 2015-03-04 VITALS — BP 97/53 | HR 77 | Temp 97.7°F | Resp 16 | Ht 69.0 in | Wt 256.4 lb

## 2015-03-04 DIAGNOSIS — Z79899 Other long term (current) drug therapy: Secondary | ICD-10-CM | POA: Diagnosis not present

## 2015-03-04 DIAGNOSIS — R109 Unspecified abdominal pain: Secondary | ICD-10-CM | POA: Insufficient documentation

## 2015-03-04 DIAGNOSIS — R4781 Slurred speech: Secondary | ICD-10-CM | POA: Insufficient documentation

## 2015-03-04 DIAGNOSIS — Z87442 Personal history of urinary calculi: Secondary | ICD-10-CM | POA: Insufficient documentation

## 2015-03-04 DIAGNOSIS — Z9981 Dependence on supplemental oxygen: Secondary | ICD-10-CM | POA: Diagnosis not present

## 2015-03-04 DIAGNOSIS — I959 Hypotension, unspecified: Secondary | ICD-10-CM | POA: Diagnosis not present

## 2015-03-04 DIAGNOSIS — Z7982 Long term (current) use of aspirin: Secondary | ICD-10-CM | POA: Insufficient documentation

## 2015-03-04 DIAGNOSIS — R339 Retention of urine, unspecified: Secondary | ICD-10-CM | POA: Diagnosis not present

## 2015-03-04 DIAGNOSIS — C3411 Malignant neoplasm of upper lobe, right bronchus or lung: Secondary | ICD-10-CM

## 2015-03-04 DIAGNOSIS — M1990 Unspecified osteoarthritis, unspecified site: Secondary | ICD-10-CM | POA: Insufficient documentation

## 2015-03-04 DIAGNOSIS — Z5112 Encounter for antineoplastic immunotherapy: Secondary | ICD-10-CM | POA: Diagnosis not present

## 2015-03-04 DIAGNOSIS — J449 Chronic obstructive pulmonary disease, unspecified: Secondary | ICD-10-CM | POA: Insufficient documentation

## 2015-03-04 DIAGNOSIS — C3432 Malignant neoplasm of lower lobe, left bronchus or lung: Secondary | ICD-10-CM

## 2015-03-04 DIAGNOSIS — Z85118 Personal history of other malignant neoplasm of bronchus and lung: Secondary | ICD-10-CM | POA: Diagnosis not present

## 2015-03-04 DIAGNOSIS — I129 Hypertensive chronic kidney disease with stage 1 through stage 4 chronic kidney disease, or unspecified chronic kidney disease: Secondary | ICD-10-CM | POA: Insufficient documentation

## 2015-03-04 DIAGNOSIS — Z66 Do not resuscitate: Secondary | ICD-10-CM

## 2015-03-04 DIAGNOSIS — R200 Anesthesia of skin: Secondary | ICD-10-CM | POA: Diagnosis not present

## 2015-03-04 DIAGNOSIS — G4700 Insomnia, unspecified: Secondary | ICD-10-CM | POA: Diagnosis not present

## 2015-03-04 DIAGNOSIS — E785 Hyperlipidemia, unspecified: Secondary | ICD-10-CM | POA: Diagnosis not present

## 2015-03-04 DIAGNOSIS — Z87828 Personal history of other (healed) physical injury and trauma: Secondary | ICD-10-CM | POA: Diagnosis not present

## 2015-03-04 DIAGNOSIS — R42 Dizziness and giddiness: Secondary | ICD-10-CM | POA: Diagnosis not present

## 2015-03-04 DIAGNOSIS — Z87891 Personal history of nicotine dependence: Secondary | ICD-10-CM | POA: Insufficient documentation

## 2015-03-04 DIAGNOSIS — Z9889 Other specified postprocedural states: Secondary | ICD-10-CM | POA: Insufficient documentation

## 2015-03-04 DIAGNOSIS — Z7952 Long term (current) use of systemic steroids: Secondary | ICD-10-CM | POA: Diagnosis not present

## 2015-03-04 DIAGNOSIS — R05 Cough: Secondary | ICD-10-CM | POA: Diagnosis not present

## 2015-03-04 DIAGNOSIS — E876 Hypokalemia: Secondary | ICD-10-CM | POA: Insufficient documentation

## 2015-03-04 DIAGNOSIS — C7951 Secondary malignant neoplasm of bone: Secondary | ICD-10-CM

## 2015-03-04 DIAGNOSIS — N184 Chronic kidney disease, stage 4 (severe): Secondary | ICD-10-CM | POA: Diagnosis not present

## 2015-03-04 DIAGNOSIS — R011 Cardiac murmur, unspecified: Secondary | ICD-10-CM | POA: Insufficient documentation

## 2015-03-04 DIAGNOSIS — C3410 Malignant neoplasm of upper lobe, unspecified bronchus or lung: Secondary | ICD-10-CM

## 2015-03-04 DIAGNOSIS — I509 Heart failure, unspecified: Secondary | ICD-10-CM | POA: Diagnosis not present

## 2015-03-04 DIAGNOSIS — I5031 Acute diastolic (congestive) heart failure: Secondary | ICD-10-CM

## 2015-03-04 DIAGNOSIS — J441 Chronic obstructive pulmonary disease with (acute) exacerbation: Secondary | ICD-10-CM

## 2015-03-04 LAB — CBC WITH DIFFERENTIAL/PLATELET
BASO%: 0.2 % (ref 0.0–2.0)
Basophils Absolute: 0 10*3/uL (ref 0.0–0.1)
EOS ABS: 0 10*3/uL (ref 0.0–0.5)
EOS%: 0.3 % (ref 0.0–7.0)
HEMATOCRIT: 39.6 % (ref 38.4–49.9)
HGB: 11.8 g/dL — ABNORMAL LOW (ref 13.0–17.1)
LYMPH%: 9.5 % — AB (ref 14.0–49.0)
MCH: 27.1 pg — ABNORMAL LOW (ref 27.2–33.4)
MCHC: 29.8 g/dL — ABNORMAL LOW (ref 32.0–36.0)
MCV: 90.8 fL (ref 79.3–98.0)
MONO#: 0.7 10*3/uL (ref 0.1–0.9)
MONO%: 10.9 % (ref 0.0–14.0)
NEUT%: 79.1 % — ABNORMAL HIGH (ref 39.0–75.0)
NEUTROS ABS: 4.9 10*3/uL (ref 1.5–6.5)
PLATELETS: 190 10*3/uL (ref 140–400)
RBC: 4.36 10*6/uL (ref 4.20–5.82)
RDW: 19 % — ABNORMAL HIGH (ref 11.0–14.6)
WBC: 6.2 10*3/uL (ref 4.0–10.3)
lymph#: 0.6 10*3/uL — ABNORMAL LOW (ref 0.9–3.3)

## 2015-03-04 LAB — COMPREHENSIVE METABOLIC PANEL
ALBUMIN: 2.9 g/dL — AB (ref 3.5–5.0)
ALK PHOS: 71 U/L (ref 40–150)
ALT: 11 U/L (ref 0–55)
ANION GAP: 13 meq/L — AB (ref 3–11)
AST: 22 U/L (ref 5–34)
BILIRUBIN TOTAL: 1.08 mg/dL (ref 0.20–1.20)
BUN: 52.8 mg/dL — ABNORMAL HIGH (ref 7.0–26.0)
CALCIUM: 8.6 mg/dL (ref 8.4–10.4)
CO2: 24 mEq/L (ref 22–29)
CREATININE: 1.9 mg/dL — AB (ref 0.7–1.3)
Chloride: 100 mEq/L (ref 98–109)
EGFR: 41 mL/min/{1.73_m2} — AB (ref >90)
Glucose: 96 mg/dl (ref 70–140)
Potassium: 3.3 mEq/L — ABNORMAL LOW (ref 3.5–5.1)
Sodium: 137 mEq/L (ref 136–145)
TOTAL PROTEIN: 6.1 g/dL — AB (ref 6.4–8.3)

## 2015-03-04 LAB — URINALYSIS, ROUTINE W REFLEX MICROSCOPIC
BILIRUBIN URINE: NEGATIVE
Glucose, UA: NEGATIVE mg/dL
HGB URINE DIPSTICK: NEGATIVE
KETONES UR: NEGATIVE mg/dL
Leukocytes, UA: NEGATIVE
Nitrite: NEGATIVE
PROTEIN: NEGATIVE mg/dL
SPECIFIC GRAVITY, URINE: 1.009 (ref 1.005–1.030)
pH: 6 (ref 5.0–8.0)

## 2015-03-04 LAB — TSH: TSH: 3.344 m[IU]/L (ref 0.320–4.118)

## 2015-03-04 MED ORDER — SODIUM CHLORIDE 0.9 % IJ SOLN
10.0000 mL | INTRAMUSCULAR | Status: DC | PRN
  Administered 2015-03-04: 10 mL
  Filled 2015-03-04: qty 10

## 2015-03-04 MED ORDER — SODIUM CHLORIDE 0.9 % IV SOLN
Freq: Once | INTRAVENOUS | Status: AC
  Administered 2015-03-04: 12:00:00 via INTRAVENOUS

## 2015-03-04 MED ORDER — SODIUM CHLORIDE 0.9 % IV SOLN
240.0000 mg | Freq: Once | INTRAVENOUS | Status: AC
  Administered 2015-03-04: 240 mg via INTRAVENOUS
  Filled 2015-03-04: qty 24

## 2015-03-04 MED ORDER — HEPARIN SOD (PORK) LOCK FLUSH 100 UNIT/ML IV SOLN
500.0000 [IU] | Freq: Once | INTRAVENOUS | Status: AC | PRN
  Administered 2015-03-04: 500 [IU]
  Filled 2015-03-04: qty 5
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
## 2015-03-04 NOTE — ED Notes (Signed)
Patient reports hx of chf. States he took his oxygen off before he walked in the ED, usually wears 2 liters. Noted room air sat in triage 78%, pt states he is "always short of breath".

## 2015-03-04 NOTE — Telephone Encounter (Signed)
Per staff message and POF I have scheduled appts. Advised scheduler of appts. JMW  

## 2015-03-04 NOTE — Telephone Encounter (Signed)
Pt confirmed labs/ov per 02/02 POF, gave pt AVS and Calendar.... KJ, sent msg to add chemo

## 2015-03-04 NOTE — Discharge Instructions (Signed)
Please don't take Hycodan anymore as this is likely the cause of your urinary retention.  Please keep your foley in and follow up with urology.   Acute Urinary Retention, Male Acute urinary retention is the temporary inability to urinate. This is a common problem in older men. As men age their prostates become larger and block the flow of urine from the bladder. This is usually a problem that has come on gradually.  HOME CARE INSTRUCTIONS If you are sent home with a Foley catheter and a drainage system, you will need to discuss the best course of action with your health care provider. While the catheter is in, maintain a good intake of fluids. Keep the drainage bag emptied and lower than your catheter. This is so that contaminated urine will not flow back into your bladder, which could lead to a urinary tract infection. There are two main types of drainage bags. One is a large bag that usually is used at night. It has a good capacity that will allow you to sleep through the night without having to empty it. The second type is called a leg bag. It has a smaller capacity, so it needs to be emptied more frequently. However, the main advantage is that it can be attached by a leg strap and can go underneath your clothing, allowing you the freedom to move about or leave your home. Only take over-the-counter or prescription medicines for pain, discomfort, or fever as directed by your health care provider.  SEEK MEDICAL CARE IF:  You develop a low-grade fever.  You experience spasms or leakage of urine with the spasms. SEEK IMMEDIATE MEDICAL CARE IF:  1. You develop chills or fever. 2. Your catheter stops draining urine. 3. Your catheter falls out. 4. You start to develop increased bleeding that does not respond to rest and increased fluid intake. MAKE SURE YOU: 1. Understand these instructions. 2. Will watch your condition. 3. Will get help right away if you are not doing well or get worse.   This  information is not intended to replace advice given to you by your health care provider. Make sure you discuss any questions you have with your health care provider.   Document Released: 04/24/2000 Document Revised: 06/02/2014 Document Reviewed: 06/27/2012 Elsevier Interactive Patient Education 2016 [place], Adult A Foley catheter is a soft, flexible tube that is placed into the bladder to drain urine. A Foley catheter may be inserted if:  You leak urine or are not able to control when you urinate (urinary incontinence).  You are not able to urinate when you need to (urinary retention).  You had prostate surgery or surgery on the genitals.  You have certain medical conditions, such as multiple sclerosis, dementia, or a spinal cord injury. If you are going home with a Foley catheter in place, follow the instructions below. TAKING CARE OF THE CATHETER 5. Wash your hands with soap and water. 6. Using mild soap and warm water on a clean washcloth:  Clean the area on your body closest to the catheter insertion site using a circular motion, moving away from the catheter. Never wipe toward the catheter because this could sweep bacteria up into the urethra and cause infection.  Remove all traces of soap. Pat the area dry with a clean towel. For males, reposition the foreskin. 7. Attach the catheter to your leg so there is no tension on the catheter. Use adhesive tape or a leg strap. If  you are using adhesive tape, remove any sticky residue left behind by the previous tape you used. 8. Keep the drainage bag below the level of the bladder, but keep it off the floor. 9. Check throughout the day to be sure the catheter is working and urine is draining freely. Make sure the tubing does not become kinked. 10. Do not pull on the catheter or try to remove it. Pulling could damage internal tissues. TAKING CARE OF THE DRAINAGE BAGS You will be given two drainage bags to take  home. One is a large overnight drainage bag, and the other is a smaller leg bag that fits underneath clothing. You may wear the overnight bag at any time, but you should never wear the smaller leg bag at night. Follow the instructions below for how to empty, change, and clean your drainage bags. Emptying the Drainage Bag You must empty your drainage bag when it is  - full or at least 2-3 times a day. 4. Wash your hands with soap and water. 5. Keep the drainage bag below your hips, below the level of your bladder. This stops urine from going back into the tubing and into your bladder. 6. Hold the dirty bag over the toilet or a clean container. 7. Open the pour spout at the bottom of the bag and empty the urine into the toilet or container. Do not let the pour spout touch the toilet, container, or any other surface. Doing so can place bacteria on the bag, which can cause an infection. 8. Clean the pour spout with a gauze pad or cotton ball that has rubbing alcohol on it. 9. Close the pour spout. 10. Attach the bag to your leg with adhesive tape or a leg strap. 11. Wash your hands well. Changing the Drainage Bag Change your drainage bag once a month or sooner if it starts to smell bad or look dirty. Below are steps to follow when changing the drainage bag. 1. Wash your hands with soap and water. 2. Pinch off the rubber catheter so that urine does not spill out. 3. Disconnect the catheter tube from the drainage tube at the connection valve. Do not let the tubes touch any surface. 4. Clean the end of the catheter tube with an alcohol wipe. Use a different alcohol wipe to clean the end of the drainage tube. 5. Connect the catheter tube to the drainage tube of the clean drainage bag. 6. Attach the new bag to the leg with adhesive tape or a leg strap. Avoid attaching the new bag too tightly. 7. Wash your hands well. Cleaning the Drainage Bag 1. Wash your hands with soap and water. 2. Wash the bag in  warm, soapy water. 3. Rinse the bag thoroughly with warm water. 4. Fill the bag with a solution of white vinegar and water (1 cup vinegar to 1 qt warm water [.2 L vinegar to 1 L warm water]). Close the bag and soak it for 30 minutes in the solution. 5. Rinse the bag with warm water. 6. Hang the bag to dry with the pour spout open and hanging downward. 7. Store the clean bag (once it is dry) in a clean plastic bag. 8. Wash your hands well. PREVENTING INFECTION  Wash your hands before and after handling your catheter.  Take showers daily and wash the area where the catheter enters your body. Do not take baths. Replace wet leg straps with dry ones, if this applies.  Do not use powders, sprays,  or lotions on the genital area. Only use creams, lotions, or ointments as directed by your caregiver.  For females, wipe from front to back after each bowel movement.  Drink enough fluids to keep your urine clear or pale yellow unless you have a fluid restriction.  Do not let the drainage bag or tubing touch or lie on the floor.  Wear cotton underwear to absorb moisture and to keep your skin drier. SEEK MEDICAL CARE IF:   Your urine is cloudy or smells unusually bad.  Your catheter becomes clogged.  You are not draining urine into the bag or your bladder feels full.  Your catheter starts to leak. SEEK IMMEDIATE MEDICAL CARE IF:   You have pain, swelling, redness, or pus where the catheter enters the body.  You have pain in the abdomen, legs, lower back, or bladder.  You have a fever.  You see blood fill the catheter, or your urine is pink or red.  You have nausea, vomiting, or chills.  Your catheter gets pulled out. MAKE SURE YOU:   Understand these instructions.  Will watch your condition.  Will get help right away if you are not doing well or get worse.   This information is not intended to replace advice given to you by your health care provider. Make sure you discuss any  questions you have with your health care provider.   Document Released: 01/16/2005 Document Revised: 06/02/2013 Document Reviewed: 01/08/2012 Elsevier Interactive Patient Education [employer].
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
## 2015-03-04 NOTE — Progress Notes (Unsigned)
Okay to treat today despite Creatinine = 1.9 , per Dr. Julien Nordmann.

## 2015-03-04 NOTE — ED Notes (Signed)
Pt states he feels a abdominal pressure and feels like he is not able to urinate fully, started this afternoon.

## 2015-03-05 LAB — URINE CULTURE: Culture: NO GROWTH

## 2015-03-08 ENCOUNTER — Emergency Department (HOSPITAL_COMMUNITY)
Admission: EM | Admit: 2015-03-08 | Discharge: 2015-03-09 | Disposition: A | Discharge status: Home / Self Care | Payer: Commercial Managed Care - HMO | Attending: Emergency Medicine | Admitting: Emergency Medicine

## 2015-03-08 ENCOUNTER — Encounter (HOSPITAL_COMMUNITY): Payer: Self-pay

## 2015-03-08 ENCOUNTER — Encounter: Payer: Self-pay | Admitting: Adult Health

## 2015-03-08 ENCOUNTER — Ambulatory Visit (INDEPENDENT_AMBULATORY_CARE_PROVIDER_SITE_OTHER): Payer: Commercial Managed Care - HMO | Admitting: Adult Health

## 2015-03-08 VITALS — BP 106/66 | HR 79 | Temp 98.2°F | Ht 69.0 in | Wt 250.0 lb

## 2015-03-08 DIAGNOSIS — Z87442 Personal history of urinary calculi: Secondary | ICD-10-CM | POA: Diagnosis not present

## 2015-03-08 DIAGNOSIS — Z7982 Long term (current) use of aspirin: Secondary | ICD-10-CM | POA: Diagnosis not present

## 2015-03-08 DIAGNOSIS — Z79899 Other long term (current) drug therapy: Secondary | ICD-10-CM | POA: Diagnosis not present

## 2015-03-08 DIAGNOSIS — J9611 Chronic respiratory failure with hypoxia: Secondary | ICD-10-CM | POA: Diagnosis not present

## 2015-03-08 DIAGNOSIS — R011 Cardiac murmur, unspecified: Secondary | ICD-10-CM | POA: Diagnosis not present

## 2015-03-08 DIAGNOSIS — R339 Retention of urine, unspecified: Secondary | ICD-10-CM | POA: Diagnosis present

## 2015-03-08 DIAGNOSIS — R319 Hematuria, unspecified: Secondary | ICD-10-CM

## 2015-03-08 DIAGNOSIS — C3490 Malignant neoplasm of unspecified part of unspecified bronchus or lung: Secondary | ICD-10-CM

## 2015-03-08 DIAGNOSIS — N390 Urinary tract infection, site not specified: Secondary | ICD-10-CM | POA: Insufficient documentation

## 2015-03-08 DIAGNOSIS — Z87891 Personal history of nicotine dependence: Secondary | ICD-10-CM | POA: Diagnosis not present

## 2015-03-08 DIAGNOSIS — I129 Hypertensive chronic kidney disease with stage 1 through stage 4 chronic kidney disease, or unspecified chronic kidney disease: Secondary | ICD-10-CM | POA: Diagnosis not present

## 2015-03-08 DIAGNOSIS — I509 Heart failure, unspecified: Secondary | ICD-10-CM | POA: Insufficient documentation

## 2015-03-08 DIAGNOSIS — J449 Chronic obstructive pulmonary disease, unspecified: Secondary | ICD-10-CM | POA: Diagnosis not present

## 2015-03-08 DIAGNOSIS — N184 Chronic kidney disease, stage 4 (severe): Secondary | ICD-10-CM | POA: Diagnosis not present

## 2015-03-08 DIAGNOSIS — J439 Emphysema, unspecified: Secondary | ICD-10-CM | POA: Diagnosis not present

## 2015-03-08 MED ORDER — TAMSULOSIN HCL 0.4 MG PO CAPS: 0.4000 mg | ORAL_CAPSULE | Freq: Every day | ORAL | Status: AC

## 2015-03-08 NOTE — Assessment & Plan Note (Signed)
Cont follow up with Oncology .  

## 2015-03-08 NOTE — ED Notes (Signed)
Pt stated that he is seeing some blood mixing with his urine in his cathether
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
## 2015-03-08 NOTE — Assessment & Plan Note (Signed)
Cont on o2 .  

## 2015-03-08 NOTE — Patient Instructions (Addendum)
Continue DuoNeb Four times a day  .  Use Xopenex every 4hr as needed only for wheezing/shortness of breath.  Call Dr. Terrence Dupont and see if you are suppose to be on acetazolamide. Continue on Prednisone '20mg'$  1/2 daily for 2 weeks then every other day for 2 weeks and stop .  Follow med calendar closely and bring to each visit.  Follow up Dr. Chase Caller in 2 months and As needed

## 2015-03-08 NOTE — Assessment & Plan Note (Addendum)
Compensated on present regimen  Patient's medications were reviewed today and patient education was given. Computerized medication calendar was adjusted/completed  Will taper pred to off .   Plan  Continue DuoNeb Four times a day  .  Use Xopenex every 4hr as needed only for wheezing/shortness of breath.  Call Dr. Terrence Dupont and see if you are suppose to be on acetazolamide. Continue on Prednisone '20mg'$  1/2 daily for 2 weeks then every other day for 2 weeks and stop .  Follow med calendar closely and bring to each visit.  Follow up Dr. Chase Caller in 2 months and As needed

## 2015-03-08 NOTE — Discharge Instructions (Signed)

## 2015-03-08 NOTE — ED Notes (Addendum)
Pt here with c/o his foley catheter burning. He had it placed last Wednesday for urinary retention. Urine is still draining but reports burning to urethral meatus. Red tinged urine noted in pts leg bag.

## 2015-03-09 ENCOUNTER — Other Ambulatory Visit: Payer: Self-pay

## 2015-03-09 LAB — URINE MICROSCOPIC-ADD ON

## 2015-03-09 LAB — URINALYSIS, ROUTINE W REFLEX MICROSCOPIC
Bilirubin Urine: NEGATIVE
Glucose, UA: NEGATIVE mg/dL
KETONES UR: NEGATIVE mg/dL
Nitrite: NEGATIVE
PH: 7 (ref 5.0–8.0)
PROTEIN: 30 mg/dL — AB
SPECIFIC GRAVITY, URINE: 1.012 (ref 1.005–1.030)

## 2015-03-09 MED ORDER — CEPHALEXIN 250 MG PO CAPS: 250.0000 mg | ORAL_CAPSULE | Freq: Four times a day (QID) | ORAL | Status: DC

## 2015-03-09 NOTE — Patient Outreach (Signed)
[facility]) Care Management  03/09/2015  [name] Jun 02, 1945 [id number]   Met with patient and his son in patient's home to assess need for community care coordination. Patient stated  He plan to go out later today to pay his bill, his son will assist him. Patient reports continuing to weight today, states his weight has been steady.   Patient has a noticeable decrease in edema of his lower extremities.    Patient denies increased shortness of breath, continues to wear his oxygen.  Care plan reviewed and updated in collaboration with patient and son. Patient verbalizes not being ready to completed paperwork for Advanced Directive, he does confirm at least thinking about completing the paperwork.     Plan: Telephone contact next week for continued assessment, discharge

## 2015-03-15 ENCOUNTER — Emergency Department (HOSPITAL_COMMUNITY): Payer: Commercial Managed Care - HMO

## 2015-03-15 ENCOUNTER — Inpatient Hospital Stay (HOSPITAL_COMMUNITY)
Admission: EM | Admit: 2015-03-15 | Discharge: 2015-03-19 | DRG: 189 | Disposition: A | Discharge status: Home / Self Care | Payer: Commercial Managed Care - HMO | Attending: Cardiology | Admitting: Cardiology

## 2015-03-15 ENCOUNTER — Encounter (HOSPITAL_COMMUNITY): Payer: Self-pay | Admitting: Emergency Medicine

## 2015-03-15 DIAGNOSIS — Z9981 Dependence on supplemental oxygen: Secondary | ICD-10-CM

## 2015-03-15 DIAGNOSIS — I872 Venous insufficiency (chronic) (peripheral): Secondary | ICD-10-CM | POA: Diagnosis present

## 2015-03-15 DIAGNOSIS — Z87891 Personal history of nicotine dependence: Secondary | ICD-10-CM

## 2015-03-15 DIAGNOSIS — E876 Hypokalemia: Secondary | ICD-10-CM | POA: Diagnosis present

## 2015-03-15 DIAGNOSIS — Z800 Family history of malignant neoplasm of digestive organs: Secondary | ICD-10-CM

## 2015-03-15 DIAGNOSIS — Z87442 Personal history of urinary calculi: Secondary | ICD-10-CM

## 2015-03-15 DIAGNOSIS — Z801 Family history of malignant neoplasm of trachea, bronchus and lung: Secondary | ICD-10-CM

## 2015-03-15 DIAGNOSIS — Z886 Allergy status to analgesic agent status: Secondary | ICD-10-CM

## 2015-03-15 DIAGNOSIS — Z6836 Body mass index (BMI) 36.0-36.9, adult: Secondary | ICD-10-CM

## 2015-03-15 DIAGNOSIS — I5042 Chronic combined systolic (congestive) and diastolic (congestive) heart failure: Secondary | ICD-10-CM | POA: Diagnosis present

## 2015-03-15 DIAGNOSIS — I071 Rheumatic tricuspid insufficiency: Secondary | ICD-10-CM | POA: Diagnosis present

## 2015-03-15 DIAGNOSIS — G4733 Obstructive sleep apnea (adult) (pediatric): Secondary | ICD-10-CM | POA: Diagnosis present

## 2015-03-15 DIAGNOSIS — J9621 Acute and chronic respiratory failure with hypoxia: Secondary | ICD-10-CM | POA: Diagnosis not present

## 2015-03-15 DIAGNOSIS — I2510 Atherosclerotic heart disease of native coronary artery without angina pectoris: Secondary | ICD-10-CM | POA: Diagnosis present

## 2015-03-15 DIAGNOSIS — J441 Chronic obstructive pulmonary disease with (acute) exacerbation: Secondary | ICD-10-CM

## 2015-03-15 DIAGNOSIS — Z923 Personal history of irradiation: Secondary | ICD-10-CM

## 2015-03-15 DIAGNOSIS — J449 Chronic obstructive pulmonary disease, unspecified: Secondary | ICD-10-CM | POA: Diagnosis present

## 2015-03-15 DIAGNOSIS — J8410 Pulmonary fibrosis, unspecified: Secondary | ICD-10-CM | POA: Diagnosis present

## 2015-03-15 DIAGNOSIS — Z85118 Personal history of other malignant neoplasm of bronchus and lung: Secondary | ICD-10-CM

## 2015-03-15 DIAGNOSIS — Z825 Family history of asthma and other chronic lower respiratory diseases: Secondary | ICD-10-CM

## 2015-03-15 DIAGNOSIS — Z8249 Family history of ischemic heart disease and other diseases of the circulatory system: Secondary | ICD-10-CM

## 2015-03-15 DIAGNOSIS — Z9889 Other specified postprocedural states: Secondary | ICD-10-CM

## 2015-03-15 DIAGNOSIS — J9620 Acute and chronic respiratory failure, unspecified whether with hypoxia or hypercapnia: Secondary | ICD-10-CM | POA: Diagnosis present

## 2015-03-15 DIAGNOSIS — I130 Hypertensive heart and chronic kidney disease with heart failure and stage 1 through stage 4 chronic kidney disease, or unspecified chronic kidney disease: Secondary | ICD-10-CM | POA: Diagnosis present

## 2015-03-15 DIAGNOSIS — N184 Chronic kidney disease, stage 4 (severe): Secondary | ICD-10-CM | POA: Diagnosis present

## 2015-03-15 DIAGNOSIS — R011 Cardiac murmur, unspecified: Secondary | ICD-10-CM | POA: Diagnosis present

## 2015-03-15 DIAGNOSIS — Z902 Acquired absence of lung [part of]: Secondary | ICD-10-CM

## 2015-03-15 DIAGNOSIS — Z79899 Other long term (current) drug therapy: Secondary | ICD-10-CM

## 2015-03-15 DIAGNOSIS — Z9221 Personal history of antineoplastic chemotherapy: Secondary | ICD-10-CM

## 2015-03-15 DIAGNOSIS — I272 Other secondary pulmonary hypertension: Secondary | ICD-10-CM | POA: Diagnosis present

## 2015-03-15 LAB — BASIC METABOLIC PANEL
ANION GAP: 14 (ref 5–15)
BUN: 65 mg/dL — ABNORMAL HIGH (ref 6–20)
CALCIUM: 8.6 mg/dL — AB (ref 8.9–10.3)
CO2: 24 mmol/L (ref 22–32)
Chloride: 96 mmol/L — ABNORMAL LOW (ref 101–111)
Creatinine, Ser: 1.97 mg/dL — ABNORMAL HIGH (ref 0.61–1.24)
GFR calc Af Amer: 38 mL/min — ABNORMAL LOW (ref >60)
GFR, EST NON AFRICAN AMERICAN: 33 mL/min — AB (ref >60)
Glucose, Bld: 120 mg/dL — ABNORMAL HIGH (ref 65–99)
POTASSIUM: 3.6 mmol/L (ref 3.5–5.1)
SODIUM: 134 mmol/L — AB (ref 135–145)

## 2015-03-15 LAB — CBC
HEMATOCRIT: 43.1 % (ref 39.0–52.0)
HEMOGLOBIN: 12.7 g/dL — AB (ref 13.0–17.0)
MCH: 26.3 pg (ref 26.0–34.0)
MCHC: 29.5 g/dL — AB (ref 30.0–36.0)
MCV: 89.4 fL (ref 78.0–100.0)
PLATELETS: 248 10*3/uL (ref 150–400)
RBC: 4.82 MIL/uL (ref 4.22–5.81)
RDW: 19.2 % — AB (ref 11.5–15.5)
WBC: 6.4 10*3/uL (ref 4.0–10.5)

## 2015-03-15 LAB — I-STAT TROPONIN, ED: Troponin i, poc: 0.04 ng/mL (ref 0.00–0.08)

## 2015-03-15 MED ORDER — ALBUTEROL (5 MG/ML) CONTINUOUS INHALATION SOLN
10.0000 mg/h | INHALATION_SOLUTION | Freq: Once | RESPIRATORY_TRACT | Status: AC
  Administered 2015-03-15: 10 mg/h via RESPIRATORY_TRACT
  Filled 2015-03-15: qty 40

## 2015-03-15 MED ORDER — METHYLPREDNISOLONE SODIUM SUCC 125 MG IJ SOLR
125.0000 mg | Freq: Once | INTRAMUSCULAR | Status: AC
  Administered 2015-03-16: 125 mg via INTRAVENOUS
  Filled 2015-03-15: qty 2

## 2015-03-15 NOTE — ED Notes (Signed)
Pt states she became short of breath of Friday that has gotten worse. Pt states unable to take a couple steps without being completely out of breath. Pt wear 3L oxygen at home at al times.
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
## 2015-03-16 DIAGNOSIS — Z85118 Personal history of other malignant neoplasm of bronchus and lung: Secondary | ICD-10-CM | POA: Diagnosis not present

## 2015-03-16 DIAGNOSIS — J8410 Pulmonary fibrosis, unspecified: Secondary | ICD-10-CM | POA: Diagnosis present

## 2015-03-16 DIAGNOSIS — Z79899 Other long term (current) drug therapy: Secondary | ICD-10-CM | POA: Diagnosis not present

## 2015-03-16 DIAGNOSIS — I130 Hypertensive heart and chronic kidney disease with heart failure and stage 1 through stage 4 chronic kidney disease, or unspecified chronic kidney disease: Secondary | ICD-10-CM | POA: Diagnosis present

## 2015-03-16 DIAGNOSIS — I2510 Atherosclerotic heart disease of native coronary artery without angina pectoris: Secondary | ICD-10-CM | POA: Diagnosis present

## 2015-03-16 DIAGNOSIS — Z902 Acquired absence of lung [part of]: Secondary | ICD-10-CM | POA: Diagnosis not present

## 2015-03-16 DIAGNOSIS — Z923 Personal history of irradiation: Secondary | ICD-10-CM | POA: Diagnosis not present

## 2015-03-16 DIAGNOSIS — Z886 Allergy status to analgesic agent status: Secondary | ICD-10-CM | POA: Diagnosis not present

## 2015-03-16 DIAGNOSIS — N184 Chronic kidney disease, stage 4 (severe): Secondary | ICD-10-CM | POA: Diagnosis present

## 2015-03-16 DIAGNOSIS — Z6836 Body mass index (BMI) 36.0-36.9, adult: Secondary | ICD-10-CM | POA: Diagnosis not present

## 2015-03-16 DIAGNOSIS — Z9981 Dependence on supplemental oxygen: Secondary | ICD-10-CM | POA: Diagnosis not present

## 2015-03-16 DIAGNOSIS — Z801 Family history of malignant neoplasm of trachea, bronchus and lung: Secondary | ICD-10-CM | POA: Diagnosis not present

## 2015-03-16 DIAGNOSIS — E876 Hypokalemia: Secondary | ICD-10-CM | POA: Diagnosis present

## 2015-03-16 DIAGNOSIS — J449 Chronic obstructive pulmonary disease, unspecified: Secondary | ICD-10-CM | POA: Diagnosis present

## 2015-03-16 DIAGNOSIS — I272 Other secondary pulmonary hypertension: Secondary | ICD-10-CM | POA: Diagnosis present

## 2015-03-16 DIAGNOSIS — Z825 Family history of asthma and other chronic lower respiratory diseases: Secondary | ICD-10-CM | POA: Diagnosis not present

## 2015-03-16 DIAGNOSIS — I071 Rheumatic tricuspid insufficiency: Secondary | ICD-10-CM | POA: Diagnosis present

## 2015-03-16 DIAGNOSIS — Z800 Family history of malignant neoplasm of digestive organs: Secondary | ICD-10-CM | POA: Diagnosis not present

## 2015-03-16 DIAGNOSIS — Z9889 Other specified postprocedural states: Secondary | ICD-10-CM | POA: Diagnosis not present

## 2015-03-16 DIAGNOSIS — J441 Chronic obstructive pulmonary disease with (acute) exacerbation: Secondary | ICD-10-CM | POA: Diagnosis present

## 2015-03-16 DIAGNOSIS — G4733 Obstructive sleep apnea (adult) (pediatric): Secondary | ICD-10-CM | POA: Diagnosis present

## 2015-03-16 DIAGNOSIS — Z87891 Personal history of nicotine dependence: Secondary | ICD-10-CM | POA: Diagnosis not present

## 2015-03-16 DIAGNOSIS — J9621 Acute and chronic respiratory failure with hypoxia: Secondary | ICD-10-CM | POA: Diagnosis present

## 2015-03-16 DIAGNOSIS — I5042 Chronic combined systolic (congestive) and diastolic (congestive) heart failure: Secondary | ICD-10-CM | POA: Diagnosis present

## 2015-03-16 DIAGNOSIS — R011 Cardiac murmur, unspecified: Secondary | ICD-10-CM | POA: Diagnosis present

## 2015-03-16 DIAGNOSIS — I872 Venous insufficiency (chronic) (peripheral): Secondary | ICD-10-CM | POA: Diagnosis present

## 2015-03-16 DIAGNOSIS — Z8249 Family history of ischemic heart disease and other diseases of the circulatory system: Secondary | ICD-10-CM | POA: Diagnosis not present

## 2015-03-16 DIAGNOSIS — Z9221 Personal history of antineoplastic chemotherapy: Secondary | ICD-10-CM | POA: Diagnosis not present

## 2015-03-16 DIAGNOSIS — Z87442 Personal history of urinary calculi: Secondary | ICD-10-CM | POA: Diagnosis not present

## 2015-03-16 LAB — I-STAT CG4 LACTIC ACID, ED
Lactic Acid, Venous: 2.61 mmol/L (ref 0.5–2.0)
Lactic Acid, Venous: 3.65 mmol/L (ref 0.5–2.0)

## 2015-03-16 LAB — CBC
HEMATOCRIT: 37.3 % — AB (ref 39.0–52.0)
HEMOGLOBIN: 11.4 g/dL — AB (ref 13.0–17.0)
MCH: 27.3 pg (ref 26.0–34.0)
MCHC: 30.6 g/dL (ref 30.0–36.0)
MCV: 89.4 fL (ref 78.0–100.0)
Platelets: 214 10*3/uL (ref 150–400)
RBC: 4.17 MIL/uL — AB (ref 4.22–5.81)
RDW: 18.9 % — ABNORMAL HIGH (ref 11.5–15.5)
WBC: 2.4 10*3/uL — AB (ref 4.0–10.5)

## 2015-03-16 LAB — CREATININE, SERUM
Creatinine, Ser: 1.94 mg/dL — ABNORMAL HIGH (ref 0.61–1.24)
GFR calc non Af Amer: 34 mL/min — ABNORMAL LOW (ref >60)
GFR, EST AFRICAN AMERICAN: 39 mL/min — AB (ref >60)

## 2015-03-16 LAB — BRAIN NATRIURETIC PEPTIDE: B Natriuretic Peptide: 1251.4 pg/mL — ABNORMAL HIGH (ref 0.0–100.0)

## 2015-03-16 MED ORDER — SODIUM CHLORIDE 0.9% FLUSH
10.0000 mL | Freq: Two times a day (BID) | INTRAVENOUS | Status: DC
  Administered 2015-03-16: 20 mL
  Administered 2015-03-17 – 2015-03-19 (×4): 10 mL

## 2015-03-16 MED ORDER — POTASSIUM CHLORIDE CRYS ER 10 MEQ PO TBCR
20.0000 meq | EXTENDED_RELEASE_TABLET | Freq: Once | ORAL | Status: AC
  Administered 2015-03-16: 20 meq via ORAL
  Filled 2015-03-16: qty 2

## 2015-03-16 MED ORDER — IPRATROPIUM-ALBUTEROL 0.5-2.5 (3) MG/3ML IN SOLN
3.0000 mL | Freq: Four times a day (QID) | RESPIRATORY_TRACT | Status: DC
  Administered 2015-03-16 (×2): 3 mL via RESPIRATORY_TRACT
  Filled 2015-03-16 (×2): qty 3

## 2015-03-16 MED ORDER — LEVALBUTEROL HCL 1.25 MG/0.5ML IN NEBU
1.2500 mg | INHALATION_SOLUTION | Freq: Three times a day (TID) | RESPIRATORY_TRACT | Status: DC
  Administered 2015-03-16 – 2015-03-19 (×8): 1.25 mg via RESPIRATORY_TRACT
  Filled 2015-03-16 (×8): qty 0.5

## 2015-03-16 MED ORDER — HEPARIN SODIUM (PORCINE) 5000 UNIT/ML IJ SOLN
5000.0000 [IU] | Freq: Three times a day (TID) | INTRAMUSCULAR | Status: DC
  Administered 2015-03-16 – 2015-03-19 (×9): 5000 [IU] via SUBCUTANEOUS
  Filled 2015-03-16 (×9): qty 1

## 2015-03-16 MED ORDER — FUROSEMIDE 10 MG/ML IJ SOLN
40.0000 mg | Freq: Two times a day (BID) | INTRAMUSCULAR | Status: DC
  Administered 2015-03-16 – 2015-03-19 (×7): 40 mg via INTRAVENOUS
  Filled 2015-03-16 (×7): qty 4

## 2015-03-16 MED ORDER — CEPHALEXIN 250 MG PO CAPS
250.0000 mg | ORAL_CAPSULE | Freq: Four times a day (QID) | ORAL | Status: DC
  Administered 2015-03-16 (×2): 250 mg via ORAL
  Filled 2015-03-16 (×4): qty 1

## 2015-03-16 MED ORDER — METOLAZONE 5 MG PO TABS
5.0000 mg | ORAL_TABLET | Freq: Every day | ORAL | Status: DC
  Filled 2015-03-16: qty 1

## 2015-03-16 MED ORDER — IPRATROPIUM-ALBUTEROL 0.5-2.5 (3) MG/3ML IN SOLN
3.0000 mL | Freq: Once | RESPIRATORY_TRACT | Status: AC
  Administered 2015-03-16: 3 mL via RESPIRATORY_TRACT
  Filled 2015-03-16: qty 33

## 2015-03-16 MED ORDER — ALBUTEROL SULFATE (2.5 MG/3ML) 0.083% IN NEBU
2.5000 mg | INHALATION_SOLUTION | RESPIRATORY_TRACT | Status: DC | PRN
  Administered 2015-03-16: 2.5 mg via RESPIRATORY_TRACT
  Filled 2015-03-16: qty 3

## 2015-03-16 MED ORDER — SODIUM CHLORIDE 0.9% FLUSH
10.0000 mL | INTRAVENOUS | Status: DC | PRN
  Administered 2015-03-16: 10 mL
  Administered 2015-03-17: 30 mL
  Administered 2015-03-19: 10 mL
  Filled 2015-03-16 (×2): qty 40

## 2015-03-16 MED ORDER — METHYLPREDNISOLONE SODIUM SUCC 125 MG IJ SOLR
60.0000 mg | Freq: Four times a day (QID) | INTRAMUSCULAR | Status: DC
  Administered 2015-03-16 – 2015-03-18 (×7): 60 mg via INTRAVENOUS
  Filled 2015-03-16 (×9): qty 2

## 2015-03-16 MED ORDER — TAMSULOSIN HCL 0.4 MG PO CAPS
0.4000 mg | ORAL_CAPSULE | Freq: Every day | ORAL | Status: DC
  Administered 2015-03-16 – 2015-03-19 (×4): 0.4 mg via ORAL
  Filled 2015-03-16 (×4): qty 1

## 2015-03-16 MED ORDER — ASPIRIN EC 81 MG PO TBEC
81.0000 mg | DELAYED_RELEASE_TABLET | ORAL | Status: DC
  Administered 2015-03-16 – 2015-03-19 (×4): 81 mg via ORAL
  Filled 2015-03-16 (×4): qty 1

## 2015-03-16 MED ORDER — POTASSIUM CHLORIDE CRYS ER 20 MEQ PO TBCR: 20.0000 meq | EXTENDED_RELEASE_TABLET | Freq: Every day | ORAL | Status: DC

## 2015-03-16 MED ORDER — DIGOXIN 125 MCG PO TABS
0.0625 mg | ORAL_TABLET | Freq: Every day | ORAL | Status: DC
  Administered 2015-03-16 – 2015-03-19 (×4): 0.0625 mg via ORAL
  Filled 2015-03-16 (×4): qty 1

## 2015-03-16 MED ORDER — GUAIFENESIN ER 600 MG PO TB12
600.0000 mg | ORAL_TABLET | Freq: Two times a day (BID) | ORAL | Status: DC
  Administered 2015-03-16 – 2015-03-19 (×6): 600 mg via ORAL
  Filled 2015-03-16 (×6): qty 1

## 2015-03-16 MED ORDER — ALLOPURINOL 100 MG PO TABS
100.0000 mg | ORAL_TABLET | ORAL | Status: DC
  Administered 2015-03-17 – 2015-03-19 (×3): 100 mg via ORAL
  Filled 2015-03-16 (×3): qty 1

## 2015-03-16 MED ORDER — IPRATROPIUM-ALBUTEROL 0.5-2.5 (3) MG/3ML IN SOLN: 3.0000 mL | Freq: Once | RESPIRATORY_TRACT | Status: DC

## 2015-03-16 MED ORDER — FUROSEMIDE 10 MG/ML IJ SOLN
40.0000 mg | Freq: Once | INTRAMUSCULAR | Status: AC
  Administered 2015-03-16: 40 mg via INTRAVENOUS
  Filled 2015-03-16: qty 4

## 2015-03-16 MED ORDER — SODIUM CHLORIDE 0.9 % IV SOLN
INTRAVENOUS | Status: DC
  Administered 2015-03-17: 19:00:00 via INTRAVENOUS

## 2015-03-16 MED ORDER — METHYLPREDNISOLONE SODIUM SUCC 125 MG IJ SOLR
125.0000 mg | Freq: Once | INTRAMUSCULAR | Status: AC
  Administered 2015-03-16: 125 mg via INTRAVENOUS
  Filled 2015-03-16: qty 2

## 2015-03-16 NOTE — ED Notes (Signed)
Pt. O2 saturation at 85% on 5L oxygen via [place]. EDP/RT/admitting MD notified. Admitting MD advised for breathing tx and if O2 saturation does not improve to call back.
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
## 2015-03-16 NOTE — ED Notes (Signed)
Pt unable to ambulate at this time. Pt is too short of breath just getting out of the bed.

## 2015-03-17 ENCOUNTER — Other Ambulatory Visit: Payer: Self-pay | Admitting: Medical Oncology

## 2015-03-17 ENCOUNTER — Telehealth: Payer: Self-pay | Admitting: Internal Medicine

## 2015-03-17 ENCOUNTER — Encounter (HOSPITAL_COMMUNITY): Payer: Self-pay | Admitting: General Practice

## 2015-03-17 LAB — CBC
HCT: 36.2 % — ABNORMAL LOW (ref 39.0–52.0)
HEMOGLOBIN: 11.1 g/dL — AB (ref 13.0–17.0)
MCH: 27.3 pg (ref 26.0–34.0)
MCHC: 30.7 g/dL (ref 30.0–36.0)
MCV: 89.2 fL (ref 78.0–100.0)
Platelets: 213 10*3/uL (ref 150–400)
RBC: 4.06 MIL/uL — ABNORMAL LOW (ref 4.22–5.81)
RDW: 18.6 % — ABNORMAL HIGH (ref 11.5–15.5)
WBC: 3.7 10*3/uL — ABNORMAL LOW (ref 4.0–10.5)

## 2015-03-17 LAB — BASIC METABOLIC PANEL
Anion gap: 12 (ref 5–15)
Anion gap: 15 (ref 5–15)
BUN: 73 mg/dL — ABNORMAL HIGH (ref 6–20)
BUN: 74 mg/dL — AB (ref 6–20)
CALCIUM: 8.3 mg/dL — AB (ref 8.9–10.3)
CALCIUM: 8.7 mg/dL — AB (ref 8.9–10.3)
CHLORIDE: 93 mmol/L — AB (ref 101–111)
CO2: 28 mmol/L (ref 22–32)
CO2: 27 mmol/L (ref 22–32)
CREATININE: 1.78 mg/dL — AB (ref 0.61–1.24)
Chloride: 95 mmol/L — ABNORMAL LOW (ref 101–111)
Creatinine, Ser: 1.83 mg/dL — ABNORMAL HIGH (ref 0.61–1.24)
GFR calc Af Amer: 43 mL/min — ABNORMAL LOW (ref >60)
GFR calc Af Amer: 42 mL/min — ABNORMAL LOW (ref >60)
GFR calc non Af Amer: 37 mL/min — ABNORMAL LOW (ref >60)
GFR, EST NON AFRICAN AMERICAN: 36 mL/min — AB (ref >60)
GLUCOSE: 150 mg/dL — AB (ref 65–99)
GLUCOSE: 195 mg/dL — AB (ref 65–99)
POTASSIUM: 3.6 mmol/L (ref 3.5–5.1)
POTASSIUM: 3.3 mmol/L — AB (ref 3.5–5.1)
Sodium: 135 mmol/L (ref 135–145)
Sodium: 135 mmol/L (ref 135–145)

## 2015-03-17 LAB — BRAIN NATRIURETIC PEPTIDE: B NATRIURETIC PEPTIDE 5: 919.1 pg/mL — AB (ref 0.0–100.0)

## 2015-03-17 LAB — MAGNESIUM: MAGNESIUM: 2.1 mg/dL (ref 1.7–2.4)

## 2015-03-17 NOTE — Progress Notes (Signed)
Utilization review completed.  

## 2015-03-17 NOTE — Telephone Encounter (Signed)
Appointments cancelled 2/16 per nurse 2/15 pof

## 2015-03-17 NOTE — Progress Notes (Signed)
I spoke to pts son and cancelled pts appt and told him to keep march appt.
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
## 2015-03-18 ENCOUNTER — Other Ambulatory Visit: Payer: Commercial Managed Care - HMO

## 2015-03-18 ENCOUNTER — Ambulatory Visit: Payer: Commercial Managed Care - HMO

## 2015-03-18 ENCOUNTER — Ambulatory Visit: Payer: Commercial Managed Care - HMO | Admitting: Internal Medicine

## 2015-03-18 MED ORDER — PREDNISONE 20 MG PO TABS
40.0000 mg | ORAL_TABLET | Freq: Every day | ORAL | Status: DC
  Administered 2015-03-18 – 2015-03-19 (×2): 40 mg via ORAL
  Filled 2015-03-18 (×2): qty 2

## 2015-03-18 MED ORDER — METOLAZONE 5 MG PO TABS
5.0000 mg | ORAL_TABLET | Freq: Every day | ORAL | Status: DC
  Administered 2015-03-18 – 2015-03-19 (×2): 5 mg via ORAL
  Filled 2015-03-18 (×2): qty 1

## 2015-03-18 MED ORDER — POTASSIUM CHLORIDE CRYS ER 20 MEQ PO TBCR
40.0000 meq | EXTENDED_RELEASE_TABLET | Freq: Once | ORAL | Status: AC
  Administered 2015-03-18: 40 meq via ORAL
  Filled 2015-03-18: qty 2
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
## 2015-03-18 NOTE — Consult Note (Signed)
   [facility] Inpatient Consult   03/18/2015  Jaydian Santana [facility] 1945/09/30 [id number] Patient is currently active with [facility] for chronic disease management services. Met with the patient at the bedside. Patient endorses ongoing care management services.   Patient has been engaged by a [employer].  Our community based plan of care has focused on disease management and community resource support.  Patient will receive a post discharge transition of care call and will be evaluated for monthly home visits for assessments and disease process education. Will make Inpatient Case Manager aware that [facility] following. Of note, [facility] Care Management services does not replace or interfere with any services that are needed or arranged by inpatient case management or social work.  For additional questions or referrals please contact: Natividad Brood, RN BSN [facility] Liaison  [phone] business mobile phone Toll free office [phone]

## 2015-03-18 NOTE — Plan of Care (Signed)
Problem: Pain Managment: Goal: General experience of comfort will improve Outcome: Not Applicable Date Met:  25/74/93 Patient continues to deny pain

## 2015-03-19 MED ORDER — PREDNISONE 20 MG PO TABS: 20.0000 mg | ORAL_TABLET | Freq: Every day | ORAL | Status: DC

## 2015-03-19 MED ORDER — HEPARIN SOD (PORK) LOCK FLUSH 100 UNIT/ML IV SOLN
500.0000 [IU] | INTRAVENOUS | Status: AC | PRN
  Administered 2015-03-19: 500 [IU]

## 2015-03-19 NOTE — Progress Notes (Signed)
Discussed with the patient and all questioned fully answered. Portacath deaccessed via IV team before DC. He will call me if any problems arise.  Fritz Pickerel, RN

## 2015-03-19 NOTE — Care Management Important Message (Signed)
Important Message  Patient Details  Name: [name] MRN: [id number] Date of Birth: [birth year]   Medicare Important Message Given:  Yes    Nathen May 03/19/2015, 11:56 AM
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
## 2015-03-19 NOTE — Discharge Summary (Signed)
Discharge summary dictated on 03/19/2015 dictation number is [phone]

## 2015-03-19 NOTE — Discharge Instructions (Signed)
Respiratory failure is when your lungs are not working well and your breathing (respiratory) system fails. When respiratory failure occurs, it is difficult for your lungs to get enough oxygen, get rid of carbon dioxide, or both. Respiratory failure can be life threatening.  °Respiratory failure can be acute or chronic. Acute respiratory failure is sudden, severe, and requires emergency medical treatment. Chronic respiratory failure is less severe, happens over time, and requires ongoing treatment.  °WHAT ARE THE CAUSES OF ACUTE RESPIRATORY FAILURE?  °Any problem affecting the heart or lungs can cause acute respiratory failure. Some of these causes include the following: °· Chronic bronchitis and emphysema (COPD).   °· Blood clot going to a lung (pulmonary embolism).   °· Having water in the lungs caused by heart failure, lung injury, or infection (pulmonary edema).   °· Collapsed lung (pneumothorax).   °· Pneumonia.   °· Pulmonary fibrosis.   °· Obesity.   °· Asthma.   °· Heart failure.   °· Any type of trauma to the chest that can make breathing difficult.   °· Nerve or muscle diseases making chest movements difficult. °HOW WILL MY ACUTE RESPIRATORY FAILURE BE TREATED?  °Treatment of acute respiratory failure depends on the cause of the respiratory failure. Usually, you will stay in the intensive care unit so your breathing can be watched closely. Treatment can include the following: °· Oxygen. Oxygen can be delivered through the following: °¨ Nasal cannula. This is small tubing that goes in your nose to give you oxygen. °¨ Face mask. A face mask covers your nose and mouth to give you oxygen. °· Medicine. Different medicines can be given to help with breathing. These can include: °¨ Nebulizers. Nebulizers deliver medicines to open the air passages (bronchodilators). These medicines help to open or relax the airways in the lungs so you can breathe better. They can also help loosen mucus from your  lungs. °¨ Diuretics. Diuretic medicines can help you breathe better by getting rid of extra water in your body. °¨ Steroids. Steroid medicines can help decrease swelling (inflammation) in your lungs. °¨ Antibiotics. °· Chest tube. If you have a collapsed lung (pneumothorax), a chest tube is placed to help reinflate the lung. °· Noninvasive positive pressure ventilation (NPPV). This is a tight-fitting mask that goes over your nose and mouth. The mask has tubing that is attached to a machine. The machine blows air into the tubing, which helps to keep the tiny air sacs (alveoli) in your lungs open. This machine allows you to breathe on your own. °· Ventilator. A ventilator is a breathing machine. When on a ventilator, a breathing tube is put into the lungs. A ventilator is used when you can no longer breathe well enough on your own. You may have low oxygen levels or high carbon dioxide (CO2) levels in your blood. When you are on a ventilator, sedation and pain medicines are given to make you sleep so your lungs can heal. °SEEK IMMEDIATE MEDICAL CARE IF: °· You have shortness of breath (dyspnea) with or without activity. °· You have rapid breathing (tachypnea). °· You are wheezing. °· You are unable to say more than a few words without having to catch your breath. °· You find it very difficult to function normally. °· You have a fast heart rate. °· You have a bluish color to your finger or toe nail beds. °· You have confusion or drowsiness or both. °  °This information is not intended to replace advice given to you by your health care provider. Make sure you discuss   any questions you have with your health care provider. °  °Document Released: 01/21/2013 Document Revised: 10/07/2014 Document Reviewed: 01/21/2013 °Elsevier Interactive Patient Education ©2016 [employer] ° °

## 2015-03-21 ENCOUNTER — Emergency Department (HOSPITAL_COMMUNITY): Payer: Commercial Managed Care - HMO

## 2015-03-21 ENCOUNTER — Encounter (HOSPITAL_COMMUNITY): Payer: Self-pay | Admitting: Nurse Practitioner

## 2015-03-21 ENCOUNTER — Inpatient Hospital Stay (HOSPITAL_COMMUNITY)
Admission: EM | Admit: 2015-03-21 | Discharge: 2015-04-04 | DRG: 193 | Disposition: A | Discharge status: Home Health | Payer: Commercial Managed Care - HMO | Attending: Cardiology | Admitting: Cardiology

## 2015-03-21 DIAGNOSIS — J189 Pneumonia, unspecified organism: Secondary | ICD-10-CM | POA: Insufficient documentation

## 2015-03-21 DIAGNOSIS — Z885 Allergy status to narcotic agent status: Secondary | ICD-10-CM

## 2015-03-21 DIAGNOSIS — J440 Chronic obstructive pulmonary disease with acute lower respiratory infection: Secondary | ICD-10-CM | POA: Diagnosis present

## 2015-03-21 DIAGNOSIS — I2781 Cor pulmonale (chronic): Secondary | ICD-10-CM | POA: Diagnosis present

## 2015-03-21 DIAGNOSIS — Z87891 Personal history of nicotine dependence: Secondary | ICD-10-CM

## 2015-03-21 DIAGNOSIS — J9621 Acute and chronic respiratory failure with hypoxia: Secondary | ICD-10-CM | POA: Diagnosis present

## 2015-03-21 DIAGNOSIS — J1000 Influenza due to other identified influenza virus with unspecified type of pneumonia: Secondary | ICD-10-CM | POA: Diagnosis present

## 2015-03-21 DIAGNOSIS — J84112 Idiopathic pulmonary fibrosis: Secondary | ICD-10-CM

## 2015-03-21 DIAGNOSIS — I130 Hypertensive heart and chronic kidney disease with heart failure and stage 1 through stage 4 chronic kidney disease, or unspecified chronic kidney disease: Secondary | ICD-10-CM | POA: Diagnosis present

## 2015-03-21 DIAGNOSIS — J9601 Acute respiratory failure with hypoxia: Secondary | ICD-10-CM | POA: Diagnosis present

## 2015-03-21 DIAGNOSIS — Z9221 Personal history of antineoplastic chemotherapy: Secondary | ICD-10-CM

## 2015-03-21 DIAGNOSIS — J849 Interstitial pulmonary disease, unspecified: Secondary | ICD-10-CM | POA: Diagnosis present

## 2015-03-21 DIAGNOSIS — Y95 Nosocomial condition: Secondary | ICD-10-CM | POA: Diagnosis present

## 2015-03-21 DIAGNOSIS — J9600 Acute respiratory failure, unspecified whether with hypoxia or hypercapnia: Secondary | ICD-10-CM | POA: Diagnosis not present

## 2015-03-21 DIAGNOSIS — C3492 Malignant neoplasm of unspecified part of left bronchus or lung: Secondary | ICD-10-CM | POA: Diagnosis present

## 2015-03-21 DIAGNOSIS — J8410 Pulmonary fibrosis, unspecified: Secondary | ICD-10-CM | POA: Diagnosis present

## 2015-03-21 DIAGNOSIS — I272 Other secondary pulmonary hypertension: Secondary | ICD-10-CM | POA: Diagnosis present

## 2015-03-21 DIAGNOSIS — Z6836 Body mass index (BMI) 36.0-36.9, adult: Secondary | ICD-10-CM

## 2015-03-21 DIAGNOSIS — I071 Rheumatic tricuspid insufficiency: Secondary | ICD-10-CM | POA: Diagnosis present

## 2015-03-21 DIAGNOSIS — E785 Hyperlipidemia, unspecified: Secondary | ICD-10-CM | POA: Diagnosis present

## 2015-03-21 DIAGNOSIS — Q211 Atrial septal defect: Secondary | ICD-10-CM

## 2015-03-21 DIAGNOSIS — C3490 Malignant neoplasm of unspecified part of unspecified bronchus or lung: Secondary | ICD-10-CM

## 2015-03-21 DIAGNOSIS — Z9981 Dependence on supplemental oxygen: Secondary | ICD-10-CM

## 2015-03-21 DIAGNOSIS — I959 Hypotension, unspecified: Secondary | ICD-10-CM | POA: Diagnosis not present

## 2015-03-21 DIAGNOSIS — J441 Chronic obstructive pulmonary disease with (acute) exacerbation: Secondary | ICD-10-CM | POA: Diagnosis present

## 2015-03-21 DIAGNOSIS — N182 Chronic kidney disease, stage 2 (mild): Secondary | ICD-10-CM | POA: Diagnosis present

## 2015-03-21 DIAGNOSIS — G4733 Obstructive sleep apnea (adult) (pediatric): Secondary | ICD-10-CM | POA: Diagnosis present

## 2015-03-21 DIAGNOSIS — J9602 Acute respiratory failure with hypercapnia: Secondary | ICD-10-CM

## 2015-03-21 DIAGNOSIS — J111 Influenza due to unidentified influenza virus with other respiratory manifestations: Secondary | ICD-10-CM

## 2015-03-21 DIAGNOSIS — I5023 Acute on chronic systolic (congestive) heart failure: Secondary | ICD-10-CM | POA: Diagnosis present

## 2015-03-21 DIAGNOSIS — Z902 Acquired absence of lung [part of]: Secondary | ICD-10-CM | POA: Diagnosis not present

## 2015-03-21 DIAGNOSIS — I872 Venous insufficiency (chronic) (peripheral): Secondary | ICD-10-CM | POA: Diagnosis present

## 2015-03-21 DIAGNOSIS — Z85118 Personal history of other malignant neoplasm of bronchus and lung: Secondary | ICD-10-CM | POA: Diagnosis not present

## 2015-03-21 LAB — I-STAT TROPONIN, ED: TROPONIN I, POC: 0.05 ng/mL (ref 0.00–0.08)

## 2015-03-21 LAB — CBC
HCT: 44 % (ref 39.0–52.0)
HEMOGLOBIN: 12.9 g/dL — AB (ref 13.0–17.0)
MCH: 26.1 pg (ref 26.0–34.0)
MCHC: 29.3 g/dL — ABNORMAL LOW (ref 30.0–36.0)
MCV: 88.9 fL (ref 78.0–100.0)
Platelets: 282 10*3/uL (ref 150–400)
RBC: 4.95 MIL/uL (ref 4.22–5.81)
RDW: 18.9 % — ABNORMAL HIGH (ref 11.5–15.5)
WBC: 7.8 10*3/uL (ref 4.0–10.5)

## 2015-03-21 LAB — BASIC METABOLIC PANEL
ANION GAP: 12 (ref 5–15)
BUN: 62 mg/dL — ABNORMAL HIGH (ref 6–20)
CALCIUM: 8.6 mg/dL — AB (ref 8.9–10.3)
CO2: 26 mmol/L (ref 22–32)
Chloride: 97 mmol/L — ABNORMAL LOW (ref 101–111)
Creatinine, Ser: 1.47 mg/dL — ABNORMAL HIGH (ref 0.61–1.24)
GFR calc non Af Amer: 47 mL/min — ABNORMAL LOW (ref >60)
GFR, EST AFRICAN AMERICAN: 54 mL/min — AB (ref >60)
Glucose, Bld: 151 mg/dL — ABNORMAL HIGH (ref 65–99)
POTASSIUM: 4.1 mmol/L (ref 3.5–5.1)
Sodium: 135 mmol/L (ref 135–145)

## 2015-03-21 LAB — I-STAT ARTERIAL BLOOD GAS, ED
ACID-BASE EXCESS: 4 mmol/L — AB (ref 0.0–2.0)
Bicarbonate: 27.5 mEq/L — ABNORMAL HIGH (ref 20.0–24.0)
O2 SAT: 94 %
PH ART: 7.486 — AB (ref 7.350–7.450)
Patient temperature: 98.6
TCO2: 29 mmol/L (ref 0–100)
pCO2 arterial: 36.4 mmHg (ref 35.0–45.0)
pO2, Arterial: 65 mmHg — ABNORMAL LOW (ref 80.0–100.0)

## 2015-03-21 LAB — BRAIN NATRIURETIC PEPTIDE: B NATRIURETIC PEPTIDE 5: 826.7 pg/mL — AB (ref 0.0–100.0)

## 2015-03-21 LAB — TROPONIN I: TROPONIN I: 0.05 ng/mL — AB (ref <0.031)

## 2015-03-21 MED ORDER — DIGOXIN 125 MCG PO TABS
0.0625 mg | ORAL_TABLET | Freq: Every day | ORAL | Status: DC
  Administered 2015-03-22 – 2015-03-23 (×2): 0.0625 mg via ORAL
  Filled 2015-03-21 (×2): qty 1

## 2015-03-21 MED ORDER — ACETAMINOPHEN 325 MG PO TABS: 650.0000 mg | ORAL_TABLET | ORAL | Status: DC | PRN

## 2015-03-21 MED ORDER — LORAZEPAM 2 MG/ML IJ SOLN
0.5000 mg | Freq: Once | INTRAMUSCULAR | Status: AC
  Administered 2015-03-21: 0.5 mg via INTRAVENOUS
  Filled 2015-03-21: qty 1

## 2015-03-21 MED ORDER — POTASSIUM CHLORIDE CRYS ER 20 MEQ PO TBCR
20.0000 meq | EXTENDED_RELEASE_TABLET | Freq: Every day | ORAL | Status: DC
  Administered 2015-03-22 – 2015-04-04 (×14): 20 meq via ORAL
  Filled 2015-03-21 (×14): qty 1

## 2015-03-21 MED ORDER — IPRATROPIUM-ALBUTEROL 0.5-2.5 (3) MG/3ML IN SOLN
3.0000 mL | Freq: Two times a day (BID) | RESPIRATORY_TRACT | Status: DC
  Administered 2015-03-21 – 2015-03-23 (×4): 3 mL via RESPIRATORY_TRACT
  Filled 2015-03-21 (×4): qty 3

## 2015-03-21 MED ORDER — ASPIRIN EC 81 MG PO TBEC
81.0000 mg | DELAYED_RELEASE_TABLET | ORAL | Status: DC
  Administered 2015-03-22 – 2015-04-04 (×14): 81 mg via ORAL
  Filled 2015-03-21 (×15): qty 1

## 2015-03-21 MED ORDER — VANCOMYCIN HCL 10 G IV SOLR
1250.0000 mg | INTRAVENOUS | Status: DC
  Filled 2015-03-21: qty 1250

## 2015-03-21 MED ORDER — HEPARIN SODIUM (PORCINE) 5000 UNIT/ML IJ SOLN
5000.0000 [IU] | Freq: Three times a day (TID) | INTRAMUSCULAR | Status: DC
  Administered 2015-03-21 – 2015-04-04 (×40): 5000 [IU] via SUBCUTANEOUS
  Filled 2015-03-21 (×39): qty 1

## 2015-03-21 MED ORDER — SODIUM CHLORIDE 0.9 % IV SOLN
250.0000 mL | INTRAVENOUS | Status: DC | PRN
  Administered 2015-03-22: 250 mL via INTRAVENOUS
  Administered 2015-03-26: 500 mL via INTRAVENOUS

## 2015-03-21 MED ORDER — ONDANSETRON HCL 4 MG/2ML IJ SOLN: 4.0000 mg | Freq: Four times a day (QID) | INTRAMUSCULAR | Status: DC | PRN

## 2015-03-21 MED ORDER — SODIUM CHLORIDE 0.9% FLUSH
3.0000 mL | Freq: Two times a day (BID) | INTRAVENOUS | Status: DC
  Administered 2015-03-21 – 2015-04-04 (×11): 3 mL via INTRAVENOUS

## 2015-03-21 MED ORDER — DEXTROSE 5 % IV SOLN
1.0000 g | Freq: Once | INTRAVENOUS | Status: AC
  Administered 2015-03-21: 1 g via INTRAVENOUS
  Filled 2015-03-21: qty 1

## 2015-03-21 MED ORDER — DEXTROSE 5 % IV SOLN
2.0000 g | INTRAVENOUS | Status: DC
  Filled 2015-03-21 (×2): qty 2

## 2015-03-21 MED ORDER — SODIUM CHLORIDE 0.9% FLUSH
3.0000 mL | INTRAVENOUS | Status: DC | PRN
  Administered 2015-03-31: 3 mL via INTRAVENOUS
  Filled 2015-03-21: qty 3

## 2015-03-21 MED ORDER — DOPAMINE-DEXTROSE 3.2-5 MG/ML-% IV SOLN
5.0000 ug/kg/min | INTRAVENOUS | Status: DC
  Administered 2015-03-21: 2.5 ug/kg/min via INTRAVENOUS
  Filled 2015-03-21 (×2): qty 250

## 2015-03-21 MED ORDER — TAMSULOSIN HCL 0.4 MG PO CAPS
0.4000 mg | ORAL_CAPSULE | Freq: Every day | ORAL | Status: DC
  Administered 2015-03-22 – 2015-04-04 (×14): 0.4 mg via ORAL
  Filled 2015-03-21 (×14): qty 1

## 2015-03-21 MED ORDER — VANCOMYCIN HCL 10 G IV SOLR
2500.0000 mg | Freq: Once | INTRAVENOUS | Status: AC
  Administered 2015-03-21: 2500 mg via INTRAVENOUS
  Filled 2015-03-21 (×2): qty 2500

## 2015-03-21 MED ORDER — ALBUTEROL SULFATE (2.5 MG/3ML) 0.083% IN NEBU
5.0000 mg | INHALATION_SOLUTION | Freq: Once | RESPIRATORY_TRACT | Status: AC
  Administered 2015-03-21: 5 mg via RESPIRATORY_TRACT

## 2015-03-21 MED ORDER — METOLAZONE 5 MG PO TABS
5.0000 mg | ORAL_TABLET | ORAL | Status: DC
  Administered 2015-03-22 – 2015-04-04 (×14): 5 mg via ORAL
  Filled 2015-03-21 (×18): qty 1

## 2015-03-21 MED ORDER — PREDNISONE 20 MG PO TABS
20.0000 mg | ORAL_TABLET | Freq: Every day | ORAL | Status: DC
  Administered 2015-03-22 – 2015-03-26 (×5): 20 mg via ORAL
  Filled 2015-03-21 (×5): qty 1

## 2015-03-21 NOTE — ED Notes (Signed)
ED PA at bedside

## 2015-03-21 NOTE — Progress Notes (Signed)
Placed patient on BiPAP per order patient tolerated well.

## 2015-03-21 NOTE — ED Notes (Signed)
Josph Macho, RN to call for report.

## 2015-03-21 NOTE — Progress Notes (Addendum)
Addendum  Pharmacy consulted to also start Cefepime for HCAP  Start Cefepime 2 gram q 24 hour Monitor clinical progress, c/s, renal function, abx plan/LOT  Pharmacy Antibiotic Note  [name] is a 70 y.o. male admitted on 03/21/2015 with pneumonia.  Pharmacy has been consulted for vancomycin dosing.  Plan: Vancomycin 2500 mg IV loading dose x1 Vancomycin 1250 mg IV every 24 hours.  Goal trough 15-20 mcg/mL. Monitor clinical progress, c/s, renal function, abx plan/LOT VT'@SS'$  as indicated    Temp (24hrs), Avg:97.3 F (36.3 C), Min:97.3 F (36.3 C), Max:97.3 F (36.3 C)   Recent Labs Lab 03/15/15 1645 03/16/15 0402 03/16/15 0624 03/16/15 1700 03/17/15 0545 03/17/15 1440 03/21/15 1715  WBC 6.4  --   --  2.4* 3.7*  --  7.8  CREATININE 1.97*  --   --  1.94* 1.78* 1.83* 1.47*  LATICACIDVEN  --  2.61* 3.65*  --   --   --   --     Estimated Creatinine Clearance: 58.3 mL/min (by C-G formula based on Cr of 1.47).    Allergies  Allergen Reactions  . Codeine Other (See Comments)    Urinary retention    Antimicrobials this admission: 2/19 cefepime x 1 2/19 vancomycin >>   Dose adjustments this admission: none  Microbiology results: Influenza panel   Thank you for allowing [place] to participate in this patients care. Jens Som, PharmD Pager: [phone] 03/21/2015 6:17 PM

## 2015-03-21 NOTE — Progress Notes (Signed)
Patient was tolerating the BiPAP but was wanting to come off.  RT advised patient would give a break from BiPAP and  Try on [place] but patient began to desat to the mid 80's.  RT applied Venti Mask and patient O2 Sats  Mid 90's.  RT  Advised that if needed he may still be placed back on Bipap.RT will continue to monitor.

## 2015-03-21 NOTE — ED Provider Notes (Signed)
Medical screening examination/treatment/procedure(s) were conducted as a shared visit with non-physician practitioner(s) and myself.  I personally evaluated the patient during the encounter.   EKG Interpretation   Date/Time:  "Sunday March 21 2015 16:26:06 EST Ventricular Rate:  98 PR Interval:  174 QRS Duration: 84 QT Interval:  338 QTC Calculation: 431 R Axis:   111 Text Interpretation:  Normal sinus rhythm Possible Left atrial enlargement  Possible Right ventricular hypertrophy Nonspecific ST abnormality Abnormal  ECG No significant change since last tracing Confirmed by Janeshia Ciliberto MD, Jaiana Sheffer  ([id number]) on 03/21/2015 5:20:14 PM     CRITICAL CARE Performed by: Eldean Klatt Duo Jamiaya Bina   Total critical care time: 35 minutes  Critical care time was exclusive of separately billable procedures and treating other patients.  Critical care was necessary to treat or prevent imminent or life-threatening deterioration.  Critical care was time spent personally by me on the following activities: development of treatment plan with patient and/or surrogate as well as nursing, discussions with consultants, evaluation of patient's response to treatment, examination of patient, obtaining history from patient or surrogate, ordering and performing treatments and interventions, ordering and review of laboratory studies, ordering and review of radiographic studies, pulse oximetry and re-evaluation of patient's condition.     69"$  year old male with history of recurrent non-small cell lung carcinoma status post left lobectomy on immunotherapy, COPD, interstitial lung disease, severe pulmonary hypertension with pulmonary fibrosis,  CHF with preserved systolic function, OSA, stage IV kidney disease, and chronic respiratory failure on home 2 L  Supplemental oxygen , who presents with shortness of breath. Recently discharged from the hospital on March 19 2015 for acute on chronic respiratory failure likely due to COPD  exacerbation versus CHF exacerbation. States that since discharge he has had progressively worsening shortness of breath. No chest pain, no fevers, but having cough and sputum. Does not think he has had increased weight gain, and not taking Lasix at home. No new orthopnea or PND.   On arrival, hypoxic on 2 L, requiring 4L with increased work of breathing and conversational dyspnea. Appears fluid overloaded on exam, but on chart review, heavily diuresed during hospitalizations with lower BPs since discharge. Rhonchi and wheezes on lung exam. Placed on BiPAP for increased work of breathing. Given breathing treatments and steroids. Holding lasix given soft blood pressures for now. XR with c/f for possible PNA, and will cover for HCAP. Will discuss with Dr. Terrence Dupont and admit for ongoing management.  Forde Dandy, MD 03/22/15 [phone]

## 2015-03-21 NOTE — ED Notes (Signed)
Resp at bedside

## 2015-03-21 NOTE — ED Notes (Signed)
Report attempted 2nd time

## 2015-03-21 NOTE — ED Notes (Signed)
MD at bedside. 
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
## 2015-03-21 NOTE — ED Notes (Signed)
Pt was discharged from [facility] for COPD exacerbation on Thursday. States he has felt worse since and continues to have severe SOB. He is on 2L O2 [place] continuous at home, has had to increase to 3L since his discharge home. He denies any pain, fevers, chills. Reports dry cough. A&Ox4, becomes sob with talking

## 2015-03-22 ENCOUNTER — Encounter (HOSPITAL_COMMUNITY): Payer: Self-pay | Admitting: Emergency Medicine

## 2015-03-22 LAB — BASIC METABOLIC PANEL
Anion gap: 13 (ref 5–15)
BUN: 56 mg/dL — AB (ref 6–20)
CALCIUM: 8.7 mg/dL — AB (ref 8.9–10.3)
CO2: 28 mmol/L (ref 22–32)
Chloride: 99 mmol/L — ABNORMAL LOW (ref 101–111)
Creatinine, Ser: 1.32 mg/dL — ABNORMAL HIGH (ref 0.61–1.24)
GFR calc Af Amer: >60 mL/min (ref >60)
GFR, EST NON AFRICAN AMERICAN: 53 mL/min — AB (ref >60)
GLUCOSE: 126 mg/dL — AB (ref 65–99)
POTASSIUM: 4 mmol/L (ref 3.5–5.1)
SODIUM: 140 mmol/L (ref 135–145)

## 2015-03-22 LAB — URINALYSIS, ROUTINE W REFLEX MICROSCOPIC
BILIRUBIN URINE: NEGATIVE
GLUCOSE, UA: NEGATIVE mg/dL
HGB URINE DIPSTICK: NEGATIVE
KETONES UR: NEGATIVE mg/dL
Leukocytes, UA: NEGATIVE
Nitrite: NEGATIVE
Protein, ur: 30 mg/dL — AB
Specific Gravity, Urine: 1.016 (ref 1.005–1.030)
pH: 6.5 (ref 5.0–8.0)

## 2015-03-22 LAB — TROPONIN I
TROPONIN I: 0.05 ng/mL — AB (ref <0.031)
TROPONIN I: 0.04 ng/mL — AB (ref <0.031)

## 2015-03-22 LAB — URINE MICROSCOPIC-ADD ON
Bacteria, UA: NONE SEEN
RBC / HPF: NONE SEEN RBC/hpf (ref 0–5)
Squamous Epithelial / LPF: NONE SEEN

## 2015-03-22 LAB — INFLUENZA PANEL BY PCR (TYPE A & B)
H1N1FLUPCR: NOT DETECTED
Influenza A By PCR: POSITIVE — AB
Influenza B By PCR: NEGATIVE

## 2015-03-22 LAB — MRSA PCR SCREENING: MRSA by PCR: NEGATIVE

## 2015-03-22 MED ORDER — OSELTAMIVIR PHOSPHATE 75 MG PO CAPS
75.0000 mg | ORAL_CAPSULE | Freq: Two times a day (BID) | ORAL | Status: AC
  Administered 2015-03-22 – 2015-03-26 (×10): 75 mg via ORAL
  Filled 2015-03-22 (×10): qty 1

## 2015-03-22 MED ORDER — CETYLPYRIDINIUM CHLORIDE 0.05 % MT LIQD
7.0000 mL | Freq: Two times a day (BID) | OROMUCOSAL | Status: DC
  Administered 2015-03-22 – 2015-04-03 (×24): 7 mL via OROMUCOSAL

## 2015-03-22 MED ORDER — DEXTROSE 5 % IV SOLN
2.0000 g | Freq: Two times a day (BID) | INTRAVENOUS | Status: AC
  Administered 2015-03-22 – 2015-03-28 (×14): 2 g via INTRAVENOUS
  Filled 2015-03-22 (×15): qty 2

## 2015-03-22 MED ORDER — SODIUM CHLORIDE 0.9 % IV SOLN
INTRAVENOUS | Status: DC
Start: 2015-03-22 — End: 2015-04-04
  Administered 2015-03-23 (×2): via INTRAVENOUS
  Administered 2015-03-26: 10 mL/h via INTRAVENOUS
  Administered 2015-04-02: 03:00:00 via INTRAVENOUS

## 2015-03-22 NOTE — Care Management Note (Signed)
Case Management Note  Patient Details  Name: ERICA OSUNA MRN: [id number] Date of Birth: [birth year]  Subjective/Objective:   Pt adm w resp distress                 Action/Plan: lives at home, pcp dr Ardeth Perfect, act w thn for nse   Expected Discharge Date:                  Expected Discharge Plan:  [place]  In-House Referral:     Discharge planning Services     Post Acute Care Choice:  Resumption of Svcs/PTA Provider Choice offered to:     DME Arranged:    DME Agency:     HH Arranged:  RN, Disease Management [place] Agency:  [place]  Status of Service:     Medicare Important Message Given:    Date Medicare IM Given:    Medicare IM give by:    Date Additional Medicare IM Given:    Additional Medicare Important Message give by:     If discussed at [place] of Stay Meetings, dates discussed:    Additional Comments:ur review done  Lacretia Leigh, RN 03/22/2015, 10:04 AM

## 2015-03-22 NOTE — Progress Notes (Signed)
Subjective:  Patient denies any chest pain, complaints of cough and shortness of breath.  BP remains in the 80s on low-dose dopamine.  Objective:  Vital Signs in the last 24 hours: Temp:  [96.6 F (35.9 C)-97.3 F (36.3 C)] 96.6 F (35.9 C) (02/20 0800) Pulse Rate:  [52-103] 79 (02/20 1115) Resp:  [14-33] 32 (02/20 1115) BP: (73-135)/(51-92) 82/61 mmHg (02/20 1115) SpO2:  [85 %-100 %] 92 % (02/20 1115) FiO2 (%):  [36 %-45 %] 45 % (02/19 2235) Weight:  [111.4 kg (245 lb 9.5 oz)] 111.4 kg (245 lb 9.5 oz) (02/20 0730)  Intake/Output from previous day:   Intake/Output from this shift: Total I/O In: 593.9 [P.O.:480; I.V.:113.9] Out: 200 [Urine:200]  Physical Exam: Neck: JVD - 8 cm above sternal notch, no adenopathy, no carotid bruit and supple, symmetrical, trachea midline Lungs: bibasilar rhonchi, left more than right noted Heart: regular rate and rhythm, S1, S2 normal and soft systolic murmur noted Abdomen: soft, non-tender; bowel sounds normal; no masses,  no organomegaly Extremities: no clubbing, cyanosis 3+ edema noted  Lab Results:  Recent Labs  03/21/15 1715  WBC 7.8  HGB 12.9*  PLT 282    Recent Labs  03/21/15 1715 03/22/15 0357  NA 135 140  K 4.1 4.0  CL 97* 99*  CO2 26 28  GLUCOSE 151* 126*  BUN 62* 56*  CREATININE 1.47* 1.32*    Recent Labs  03/22/15 0209 03/22/15 0802  TROPONINI 0.05* 0.04*   Hepatic Function Panel No results for input(s): PROT, ALBUMIN, AST, ALT, ALKPHOS, BILITOT, BILIDIR, IBILI in the last 72 hours. No results for input(s): CHOL in the last 72 hours. No results for input(s): PROTIME in the last 72 hours.  Imaging: Imaging results have been reviewed and Dg Chest Port 1 View  03/21/2015  CLINICAL DATA:  Shortness of breath for several days. History of lung cancer. EXAM: PORTABLE CHEST 1 VIEW COMPARISON:  PA and lateral chest 03/15/2015. CT chest, abdomen and pelvis 02/16/2015. FINDINGS: The lungs are emphysematous. There is  left basilar airspace disease and likely a small left effusion. Mild atelectasis is seen in the right lung base. Cardiomegaly is noted. Port-A-Cath is in place. IMPRESSION: Left basilar airspace disease worrisome for pneumonia. Emphysema. Cardiomegaly without edema. Electronically Signed   By: Inge Rise M.D.   On: 03/21/2015 18:02    Cardiac Studies:  Assessment/Plan:  Acute on chronic hypoxic respiratory failure Acute on chronic left heart systolic failure. influenza pneumonia Recurrent non-small cell lung cancer s/p chemotherapy/Immunotherapy CKD, II Morbid obesity Hypertension Pulmonary fibrosis with hypertension COPD Plan Continue present management. Started on Tamiflu. Wean off dopamine as blood pressure tolerates  LOS: 1 day    [name] 03/22/2015, 1:05 PM

## 2015-03-22 NOTE — Care Management Note (Signed)
Case Management Note  Patient Details  Name: [name] MRN: [id number] Date of Birth: 07-02-1945  Subjective/Objective:     Adm w infection poss pneumonia               Action/Plan: lives at home, pcp dr Ardeth Perfect  Expected Discharge Date:                  Expected Discharge Plan:     In-House Referral:     Discharge planning Services     Post Acute Care Choice:    Choice offered to:     DME Arranged:    DME Agency:     HH Arranged:    [place] Agency:     Status of Service:     Medicare Important Message Given:    Date Medicare IM Given:    Medicare IM give by:    Date Additional Medicare IM Given:    Additional Medicare Important Message give by:     If discussed at [place] of Stay Meetings, dates discussed:    Additional Comments: ur review done  Lacretia Leigh, RN 03/22/2015, 9:58 AM

## 2015-03-22 NOTE — Consult Note (Signed)
   [facility] Inpatient Consult   03/22/2015  Ledell Codrington [facility] [birth year] [phone]    Patient is currently active with long-term disease management services with [place] [facility]. Patient was readmitted over the weekend prior to contact from [place]. Spoke with inpatient RNCM to make aware [facility] is active.  Patient will receive a post hospital discharge transition of care calls and will be evaluated for monthly home visits. Will continue to follow. Spoke with [facility] RNCM about patient hospital admission.   Marthenia Rolling, MSN-Ed, RN,BSN [facility] Liaison [phone]

## 2015-03-22 NOTE — Progress Notes (Signed)
Pharmacy Antibiotic Note  [name] is a 70 y.o. male admitted on 03/21/2015 with pneumonia/flu.    Plan: D/c Vanc  Adjust cefepime to appropriate dosing of 2g q12h  Add oseltamivir 75 mg BID x 5 days  Height: '5\' 9"'$  (175.3 cm) Weight: 245 lb 9.5 oz (111.4 kg) IBW/kg (Calculated) : 70.7  Temp (24hrs), Avg:97 F (36.1 C), Min:96.6 F (35.9 C), Max:97.3 F (36.3 C)   Recent Labs Lab 03/15/15 1645 03/16/15 0402 03/16/15 0624 03/16/15 1700 03/17/15 0545 03/17/15 1440 03/21/15 1715 03/22/15 0357  WBC 6.4  --   --  2.4* 3.7*  --  7.8  --   CREATININE 1.97*  --   --  1.94* 1.78* 1.83* 1.47* 1.32*  LATICACIDVEN  --  2.61* 3.65*  --   --   --   --   --     Estimated Creatinine Clearance: 65 mL/min (by C-G formula based on Cr of 1.32).    Allergies  Allergen Reactions  . Codeine Other (See Comments)    Urinary retention    Antimicrobials this admission: Vancomycin 2/19>>2/20  Cefepime 2/19>>  Oseltamivir 2/20>>2/26  Microbiology results: Flu +  Levester Fresh, PharmD, BCPS, [facility] Clinical Pharmacist Pager [phone] 03/22/2015 10:01 AM

## 2015-03-22 NOTE — ED Notes (Signed)
Breakfast tray ordered 

## 2015-03-23 DIAGNOSIS — J9601 Acute respiratory failure with hypoxia: Secondary | ICD-10-CM

## 2015-03-23 LAB — BASIC METABOLIC PANEL
Anion gap: 5 (ref 5–15)
BUN: 56 mg/dL — ABNORMAL HIGH (ref 6–20)
CALCIUM: 8.2 mg/dL — AB (ref 8.9–10.3)
CHLORIDE: 102 mmol/L (ref 101–111)
CO2: 29 mmol/L (ref 22–32)
CREATININE: 1.37 mg/dL — AB (ref 0.61–1.24)
GFR calc non Af Amer: 51 mL/min — ABNORMAL LOW (ref >60)
GFR, EST AFRICAN AMERICAN: 59 mL/min — AB (ref >60)
GLUCOSE: 114 mg/dL — AB (ref 65–99)
Potassium: 3.8 mmol/L (ref 3.5–5.1)
Sodium: 136 mmol/L (ref 135–145)

## 2015-03-23 MED ORDER — FUROSEMIDE 10 MG/ML IJ SOLN
40.0000 mg | Freq: Every day | INTRAMUSCULAR | Status: DC
  Administered 2015-03-23 – 2015-03-24 (×2): 40 mg via INTRAVENOUS
  Filled 2015-03-23 (×2): qty 4

## 2015-03-23 MED ORDER — LEVALBUTEROL HCL 1.25 MG/0.5ML IN NEBU
1.2500 mg | INHALATION_SOLUTION | Freq: Three times a day (TID) | RESPIRATORY_TRACT | Status: DC
  Administered 2015-03-23 – 2015-04-04 (×36): 1.25 mg via RESPIRATORY_TRACT
  Filled 2015-03-23 (×36): qty 0.5

## 2015-03-23 MED ORDER — DIGOXIN 125 MCG PO TABS
0.1250 mg | ORAL_TABLET | Freq: Every day | ORAL | Status: DC
  Administered 2015-03-24 – 2015-03-27 (×4): 0.125 mg via ORAL
  Filled 2015-03-23 (×4): qty 1

## 2015-03-23 NOTE — Progress Notes (Signed)
Subjective:  Patient denies any chest pain complaints of shortness of breath. No hemoptysis. Lasix was held due to hypotension. Patient is off dopamine now  Objective:  Vital Signs in the last 24 hours: Temp:  [97.1 F (36.2 C)-97.6 F (36.4 C)] 97.4 F (36.3 C) (02/21 0800) Pulse Rate:  [62-102] 77 (02/21 1000) Resp:  [16-32] 18 (02/21 0800) BP: (70-146)/(46-134) 100/64 mmHg (02/21 1000) SpO2:  [81 %-100 %] 92 % (02/21 1000) FiO2 (%):  [45 %-55 %] 55 % (02/21 0030) Weight:  [113.762 kg (250 lb 12.8 oz)] 113.762 kg (250 lb 12.8 oz) (02/21 0600)  Intake/Output from previous day: 02/20 0701 - 02/21 0700 In: 1954.4 [P.O.:1440; I.V.:414.4; IV Piggyback:100] Out: 1325 [Urine:1325] Intake/Output from this shift: Total I/O In: 465 [P.O.:360; I.V.:55; IV Piggyback:50] Out: -   Physical Exam: Neck: no adenopathy, no carotid bruit, no JVD and supple, symmetrical, trachea midline Lungs: Decreased breath sound at bases with bilateral rhonchi noted Heart: regular rate and rhythm, S1, S2 normal and Soft systolic murmur noted Abdomen: soft, non-tender; bowel sounds normal; no masses,  no organomegaly Extremities: No clubbing cyanosis 3+ edema noted  Lab Results:  Recent Labs  03/21/15 1715  WBC 7.8  HGB 12.9*  PLT 282    Recent Labs  03/22/15 0357 03/23/15 0420  NA 140 136  K 4.0 3.8  CL 99* 102  CO2 28 29  GLUCOSE 126* 114*  BUN 56* 56*  CREATININE 1.32* 1.37*    Recent Labs  03/22/15 0209 03/22/15 0802  TROPONINI 0.05* 0.04*   Hepatic Function Panel No results for input(s): PROT, ALBUMIN, AST, ALT, ALKPHOS, BILITOT, BILIDIR, IBILI in the last 72 hours. No results for input(s): CHOL in the last 72 hours. No results for input(s): PROTIME in the last 72 hours.  Imaging: Imaging results have been reviewed and Dg Chest Port 1 View  03/21/2015  CLINICAL DATA:  Shortness of breath for several days. History of lung cancer. EXAM: PORTABLE CHEST 1 VIEW COMPARISON:  PA and  lateral chest 03/15/2015. CT chest, abdomen and pelvis 02/16/2015. FINDINGS: The lungs are emphysematous. There is left basilar airspace disease and likely a small left effusion. Mild atelectasis is seen in the right lung base. Cardiomegaly is noted. Port-A-Cath is in place. IMPRESSION: Left basilar airspace disease worrisome for pneumonia. Emphysema. Cardiomegaly without edema. Electronically Signed   By: Inge Rise M.D.   

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
## 2015-03-24 LAB — BASIC METABOLIC PANEL
ANION GAP: 13 (ref 5–15)
BUN: 51 mg/dL — ABNORMAL HIGH (ref 6–20)
CALCIUM: 8.1 mg/dL — AB (ref 8.9–10.3)
CO2: 27 mmol/L (ref 22–32)
Chloride: 98 mmol/L — ABNORMAL LOW (ref 101–111)
Creatinine, Ser: 1.27 mg/dL — ABNORMAL HIGH (ref 0.61–1.24)
GFR, EST NON AFRICAN AMERICAN: 56 mL/min — AB (ref >60)
GLUCOSE: 110 mg/dL — AB (ref 65–99)
POTASSIUM: 3.6 mmol/L (ref 3.5–5.1)
Sodium: 138 mmol/L (ref 135–145)

## 2015-03-24 LAB — GLUCOSE, CAPILLARY: GLUCOSE-CAPILLARY: 95 mg/dL (ref 65–99)

## 2015-03-24 NOTE — Progress Notes (Signed)
Report given to [place], [place] at [phone]

## 2015-03-24 NOTE — Progress Notes (Signed)
Subjective:  Continues to complains of shortness of breath with minimal exertion.  Denies any chest pain or hemoptysis.  Objective:  Vital Signs in the last 24 hours: Temp:  [97.2 F (36.2 C)-97.9 F (36.6 C)] 97.6 F (36.4 C) (02/22 0800) Pulse Rate:  [61-85] 69 (02/22 0900) Resp:  [18-20] 18 (02/22 0800) BP: (79-99)/(55-77) 89/59 mmHg (02/22 0900) SpO2:  [86 %-99 %] 97 % (02/22 0900) Weight:  [113.671 kg (250 lb 9.6 oz)] 113.671 kg (250 lb 9.6 oz) (02/22 0336)  Intake/Output from previous day: 02/21 0701 - 02/22 0700 In: 1250 [P.O.:820; I.V.:330; IV Piggyback:100] Out: 2350 [Urine:2350] Intake/Output from this shift: Total I/O In: 20 [I.V.:20] Out: -   Physical Exam: Neck: JVD - 10 cm above sternal notch, no adenopathy, no carotid bruit, supple, symmetrical, trachea midline and thyroid not enlarged, symmetric, no tenderness/mass/nodules Lungs: decreased breath sounds in bases with bilateral rhonchi Heart: regular rate and rhythm, S1, S2 normal and 2/6 systolic murmur noted Abdomen: soft, non-tender; bowel sounds normal; no masses,  no organomegaly Extremities: no clubbing, cyanosis, 2+ edema noted  Lab Results:  Recent Labs  03/21/15 1715  WBC 7.8  HGB 12.9*  PLT 282    Recent Labs  03/23/15 0420 03/24/15 0230  NA 136 138  K 3.8 3.6  CL 102 98*  CO2 29 27  GLUCOSE 114* 110*  BUN 56* 51*  CREATININE 1.37* 1.27*    Recent Labs  03/22/15 0209 03/22/15 0802  TROPONINI 0.05* 0.04*   Hepatic Function Panel No results for input(s): PROT, ALBUMIN, AST, ALT, ALKPHOS, BILITOT, BILIDIR, IBILI in the last 72 hours. No results for input(s): CHOL in the last 72 hours. No results for input(s): PROTIME in the last 72 hours.  Imaging: Imaging results have been reviewed and No results found.  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 OT, PT consult. TEDs stockings. Will DC IV antibiotics after 7 days.  Continue Tamiflu  LOS: 3 days    [name] 03/24/2015, 10:51 AM

## 2015-03-24 NOTE — Clinical Documentation Improvement (Signed)
Dr. Terrence Dupont and/or Consultants,  (Query responses must be documented in the current medical record, not on the CDI BPA form in [facility].)  The patient has been treated with a Dopamine infusion during this admission.  Please document a corresponding diagnosis that supports the use of this medication.   Supporting Information: "BP remains in the 80s on low-dose dopamine" documented by Dr Terrence Dupont 03/22/15 at 1:10 pm "Lasix was held due to hypotension. Patient is off dopamine now" documented by Dr. Terrence Dupont 03/23/15 at 10:28 am.    Please exercise your independent, professional judgment when responding. A specific answer is not anticipated or expected.   Thank You,  Erling Conte  RN BSN CCDS [phone] Health Information Management [place]

## 2015-03-25 ENCOUNTER — Inpatient Hospital Stay (HOSPITAL_COMMUNITY): Payer: Commercial Managed Care - HMO

## 2015-03-25 ENCOUNTER — Encounter (HOSPITAL_COMMUNITY): Payer: Self-pay | Admitting: *Deleted

## 2015-03-25 DIAGNOSIS — J9601 Acute respiratory failure with hypoxia: Secondary | ICD-10-CM

## 2015-03-25 LAB — BASIC METABOLIC PANEL
ANION GAP: 8 (ref 5–15)
BUN: 46 mg/dL — ABNORMAL HIGH (ref 6–20)
CO2: 29 mmol/L (ref 22–32)
Calcium: 8.1 mg/dL — ABNORMAL LOW (ref 8.9–10.3)
Chloride: 99 mmol/L — ABNORMAL LOW (ref 101–111)
Creatinine, Ser: 1.34 mg/dL — ABNORMAL HIGH (ref 0.61–1.24)
GFR calc Af Amer: >60 mL/min (ref >60)
GFR, EST NON AFRICAN AMERICAN: 52 mL/min — AB (ref >60)
GLUCOSE: 101 mg/dL — AB (ref 65–99)
POTASSIUM: 3.8 mmol/L (ref 3.5–5.1)
SODIUM: 136 mmol/L (ref 135–145)

## 2015-03-25 LAB — CBC
HEMATOCRIT: 40.5 % (ref 39.0–52.0)
HEMOGLOBIN: 11.6 g/dL — AB (ref 13.0–17.0)
MCH: 25.7 pg — ABNORMAL LOW (ref 26.0–34.0)
MCHC: 28.6 g/dL — ABNORMAL LOW (ref 30.0–36.0)
MCV: 89.6 fL (ref 78.0–100.0)
Platelets: 252 10*3/uL (ref 150–400)
RBC: 4.52 MIL/uL (ref 4.22–5.81)
RDW: 18.9 % — ABNORMAL HIGH (ref 11.5–15.5)
WBC: 3.8 10*3/uL — AB (ref 4.0–10.5)

## 2015-03-25 LAB — DIGOXIN LEVEL: Digoxin Level: 0.4 ng/mL — ABNORMAL LOW (ref 0.8–2.0)

## 2015-03-25 MED ORDER — FUROSEMIDE 10 MG/ML IJ SOLN
40.0000 mg | Freq: Two times a day (BID) | INTRAMUSCULAR | Status: DC
  Administered 2015-03-25 – 2015-04-01 (×14): 40 mg via INTRAVENOUS
  Filled 2015-03-25 (×15): qty 4

## 2015-03-25 MED ORDER — IOHEXOL 350 MG/ML SOLN
80.0000 mL | Freq: Once | INTRAVENOUS | Status: AC | PRN
  Administered 2015-03-25: 80 mL via INTRAVENOUS

## 2015-03-25 NOTE — Progress Notes (Signed)
Subjective:  Denies any chest pain states breathing has not improved significantly. No fever or chills.  Objective:  Vital Signs in the last 24 hours: Temp:  [97.2 F (36.2 C)-97.8 F (36.6 C)] 97.2 F (36.2 C) (02/23 0753) Pulse Rate:  [67-75] 69 (02/23 0800) Resp:  [18-20] 18 (02/23 0753) BP: (85-98)/(57-68) 86/59 mmHg (02/23 0753) SpO2:  [91 %-99 %] 92 % (02/23 0850) Weight:  [113.354 kg (249 lb 14.4 oz)] 113.354 kg (249 lb 14.4 oz) (02/23 0339)  Intake/Output from previous day: 02/22 0701 - 02/23 0700 In: 8299 [P.O.:1320; I.V.:235; IV Piggyback:100] Out: 2900 [Urine:2900] Intake/Output from this shift: Total I/O In: 130 [P.O.:120; I.V.:10] Out: 275 [Urine:275]  Physical Exam: Neck: no adenopathy, no carotid bruit, no JVD and supple, symmetrical, trachea midline Lungs: Decreased breath sound at bases with bilateral rhonchi noted Heart: regular rate and rhythm, S1, S2 normal and Soft systolic murmur noted Abdomen: soft, non-tender; bowel sounds normal; no masses,  no organomegaly Extremities: No clubbing cyanosis 2+ edema noted  Lab Results:  Recent Labs  03/25/15 0515  WBC 3.8*  HGB 11.6*  PLT 252    Recent Labs  03/24/15 0230 03/25/15 0515  NA 138 136  K 3.6 3.8  CL 98* 99*  CO2 27 29  GLUCOSE 110* 101*  BUN 51* 46*  CREATININE 1.27* 1.34*   No results for input(s): TROPONINI in the last 72 hours.  Invalid input(s): CK, MB Hepatic Function Panel No results for input(s): PROT, ALBUMIN, AST, ALT, ALKPHOS, BILITOT, BILIDIR, IBILI in the last 72 hours. No results for input(s): CHOL in the last 72 hours. No results for input(s): PROTIME in the last 72 hours.  Imaging: Imaging results have been reviewed and No results found.  

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
## 2015-03-25 NOTE — Progress Notes (Signed)
PT attempted to ambulate pt this AM, pt walked around 30 ft and sats dropped into the low 80s, PT increased 02 to 10L, pt placed back in chair. 02 decreased back to 6L when pts sats increased to 93%. Pt very deconditioned and SOB.

## 2015-03-25 NOTE — Consult Note (Signed)
Name: ZAIM NITTA MRN: [id number] DOB: Jul 06, 1945    ADMISSION DATE:  03/21/2015 CONSULTATION DATE:  03/23/15  REFERRING MD : Terrence Dupont  CHIEF COMPLAINT: SOB  BRIEF PATIENT DESCRIPTION: Mr. 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  PCCM was consulted on 03/23/15 for increased SOB and therefore increased demands of oxygen.   STUDIES: and EVENTS  Influenza A  Was positive >03/22/15    SUBJECTIVE:  2/23 - does not feel better despite dopamine, prednisone, tamiflu and antibiotics. Easy desats. On 6L at rest. When walks need face mask   VITAL SIGNS: Temp:  [97.2 F (36.2 C)-97.8 F (36.6 C)] 97.6 F (36.4 C) (02/23 1157) Pulse Rate:  [58-78] 68 (02/23 1200) Resp:  [18-20] 18 (02/23 1157) BP: (85-94)/(59-68) 94/65 mmHg (02/23 1157) SpO2:  [90 %-99 %] 90 % (02/23 1200) Weight:  [113.354 kg (249 lb 14.4 oz)] 113.354 kg (249 lb 14.4 oz) (02/23 0339)  PHYSICAL EXAMINATION: General: Morbidly obese gentleman , sitting on the recliner , looks well Neuro:  Alert and oriented, normocephalic, atraumatic HEENT:clear sclera. Moist mucus memberane Cardiovascular:S1,S2 heart sound,rrr, No MRG Lungs:  Left lower lobe absent, diminished on the right lower lobe, otherwiseclear. No wheeze Abdomen: obese, non tender, bs positive.   Skin:  No rashes, ecchymosis noted MSK 3+ EDEMA   PULMONARY  Recent Labs Lab 03/21/15 1726  PHART 7.486*  PCO2ART 36.4  PO2ART 65.0*  HCO3 27.5*  TCO2 29  O2SAT 94.0    CBC  Recent Labs Lab 03/21/15 1715 03/25/15 0515  HGB 12.9* 11.6*  HCT 44.0 40.5   WBC 7.8 3.8*  PLT 282 252    COAGULATION No results for input(s): INR in the last 168 hours.  CARDIAC   Recent Labs Lab 03/21/15 2045 03/22/15 0209 03/22/15 0802  TROPONINI 0.05* 0.05* 0.04*   No results for input(s): PROBNP in the last 168 hours.   CHEMISTRY  Recent Labs Lab 03/21/15 1715 03/22/15 0357 03/23/15 0420 03/24/15 0230 03/25/15 0515  NA 135 140 136 138 136  K 4.1 4.0 3.8 3.6 3.8  CL 97* 99* 102 98* 99*  CO2 '26 28 29 27 29  '$ GLUCOSE 151* 126* 114* 110* 101*  BUN 62* 56* 56* 51* 46*  CREATININE 1.47* 1.32* 1.37* 1.27* 1.34*  CALCIUM 8.6* 8.7* 8.2* 8.1* 8.1*   Estimated Creatinine Clearance: 64.6 mL/min (by C-G formula based on Cr of 1.34).   LIVER No results for input(s): AST, ALT, ALKPHOS, BILITOT, PROT, ALBUMIN, INR in the last 168 hours.   INFECTIOUS No results for input(s): LATICACIDVEN, PROCALCITON in the last 168 hours.   ENDOCRINE CBG (last 3)   Recent Labs  03/24/15 2151  GLUCAP 95         IMAGING x48h  - image(s) personally visualized  -   highlighted in bold Dg Chest 2 View  03/25/2015  CLINICAL DATA:  70 year old male with shortness of breath and pneumonia. EXAM: CHEST  2 VIEW COMPARISON:  03/21/2015 and prior exams FINDINGS: Cardiomegaly, left Port-A-Cath and bilateral lower lung atelectasis/ airspace disease, left greater than right, again noted. Emphysema changes are again identified. There is no evidence of pneumothorax. There may be  small effusions present. IMPRESSION: Little significant change with continued bilateral lower lung atelectasis/ airspace disease, left greater than right. Probable small effusions. Electronically Signed   By: Margarette Canada M.D.   On: 03/25/2015 10:08        ASSESSMENT / PLAN:  1. 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 GET CHEST CT ANGIO rule out PE (creat and GFR noted) \  Will benefit from home pall care program  Dr. Brand Males, M.D., [facility].C.P Pulmonary and Critical Care Medicine Staff Physician [place] [facility] Pager: [phone], If no answer or between  15:00h - 7:00h: call [phone]  03/25/2015 1:39 PM

## 2015-03-26 MED ORDER — METHYLPREDNISOLONE SODIUM SUCC 125 MG IJ SOLR
60.0000 mg | Freq: Four times a day (QID) | INTRAMUSCULAR | Status: DC
  Administered 2015-03-26 – 2015-03-29 (×12): 60 mg via INTRAVENOUS
  Filled 2015-03-26 (×11): qty 2

## 2015-03-26 MED ORDER — SODIUM CHLORIDE 0.9% FLUSH
10.0000 mL | Freq: Two times a day (BID) | INTRAVENOUS | Status: DC
  Administered 2015-03-26 – 2015-03-30 (×7): 10 mL

## 2015-03-26 MED ORDER — SODIUM CHLORIDE 0.9% FLUSH
10.0000 mL | INTRAVENOUS | Status: DC | PRN
  Administered 2015-04-04: 10 mL
  Filled 2015-03-26: qty 40

## 2015-03-26 NOTE — Progress Notes (Addendum)
Name: [name] MRN: [id number] DOB: 01-20-1946    ADMISSION DATE:  03/21/2015 CONSULTATION DATE:  03/23/15  REFERRING MD : Terrence Dupont  CHIEF COMPLAINT: SOB  BRIEF PATIENT DESCRIPTION: 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  PCCM was consulted on 03/23/15 for increased SOB and therefore increased demands of oxygen.   STUDIES: and EVENTS  Influenza A  Was positive >03/22/15   2/23 - does not feel better despite dopamine, prednisone, tamiflu and antibiotics. Easy desats. On 6L at rest. When walks need face mask    SUBJECTIVE/OVERNIGHT/INTERVAL HX 03/26/15 - feeling better but stil on high flow o2. CTA ruled out PE . 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 Also reports beeing on nivolumab since oct 2016 - can cause worsening ILD  VITAL SIGNS: Temp:  [97.4 F (36.3 C)-97.8 F (36.6 C)] 97.6 F (36.4 C) (02/24 1138) Pulse Rate:  [59-80] 75 (02/24 1138) Resp:  [16-18] 16 (02/24 1138) BP: (84-98)/(57-78) 97/63 mmHg (02/24 1138) SpO2:  [91 %-99 %] 94 % (02/24 1138) Weight:  [113.671 kg (250 lb 9.6 oz)] 113.671 kg (250 lb 9.6 oz) (02/24 0400)  PHYSICAL EXAMINATION: General: Morbidly obese gentleman , sitting on the recliner , looks well Neuro:  Alert and oriented, normocephalic, atraumatic HEENT:clear sclera. Moist mucus memberane. HIGH FLOW 6 LNC  Cardiovascular:S1,S2 heart sound,rrr, No MRG Lungs:  Left lower lobe absent, diminished on the right lower lobe, otherwiseclear. No wheeze Abdomen: obese, non tender, bs positive.   Skin:  No rashes, ecchymosis noted MSK  3+ EDEMA   PULMONARY  Recent Labs Lab 03/21/15 1726  PHART 7.486*  PCO2ART 36.4  PO2ART 65.0*  HCO3 27.5*  TCO2 29  O2SAT 94.0    CBC  Recent Labs Lab 03/21/15 1715 03/25/15 0515  HGB 12.9* 11.6*  HCT 44.0 40.5  WBC 7.8 3.8*  PLT 282 252    COAGULATION No results for input(s): INR in the last 168 hours.  CARDIAC    Recent Labs Lab 03/21/15 2045 03/22/15 0209 03/22/15 0802  TROPONINI 0.05* 0.05* 0.04*   No results for input(s): PROBNP in the last 168 hours.   CHEMISTRY  Recent Labs Lab 03/21/15 1715 03/22/15 0357 03/23/15 0420 03/24/15 0230 03/25/15 0515  NA 135 140 136 138 136  K 4.1 4.0 3.8 3.6 3.8  CL 97* 99* 102 98* 99*  CO2 '26 28 29 27 29  '$ GLUCOSE 151* 126* 114* 110* 101*  BUN 62* 56* 56* 51* 46*  CREATININE 1.47* 1.32* 1.37* 1.27* 1.34*  CALCIUM 8.6* 8.7* 8.2* 8.1* 8.1*   Estimated Creatinine Clearance: 64.7 mL/min (by C-G formula based on Cr of 1.34).   LIVER No results for input(s): AST, ALT, ALKPHOS, BILITOT, PROT, ALBUMIN, INR in the last 168 hours.   INFECTIOUS No results for input(s): LATICACIDVEN, PROCALCITON in the last 168 hours.   ENDOCRINE CBG (last 3)   Recent Labs  03/24/15 2151  GLUCAP 95         IMAGING x48h  - image(s) personally visualized  -   highlighted in bold Dg Chest 2 View  03/25/2015  CLINICAL DATA:  70 year old male with shortness  of breath and pneumonia. EXAM: CHEST  2 VIEW COMPARISON:  03/21/2015 and prior exams FINDINGS: Cardiomegaly, left Port-A-Cath and bilateral lower lung atelectasis/ airspace disease, left greater than right, again noted. Emphysema changes are again identified. There is no evidence of pneumothorax. There may be small effusions present. IMPRESSION: Little significant change with continued bilateral lower lung atelectasis/ airspace disease, left greater than right. Probable small effusions. Electronically Signed   By: Margarette Canada M.D.   On: 03/25/2015 10:08   Ct Angio Chest  Pe W/cm &/or Wo Cm  03/25/2015  CLINICAL DATA:  Progressive shortness of breath. History of lung carcinoma EXAM: CT ANGIOGRAPHY CHEST WITH CONTRAST TECHNIQUE: Multidetector CT imaging of the chest was performed using the standard protocol during bolus administration of intravenous contrast. Multiplanar CT image reconstructions and MIPs were obtained to evaluate the vascular anatomy. CONTRAST:  71m OMNIPAQUE IOHEXOL 350 MG/ML SOLN COMPARISON:  Chest radiograph March 25, 2015; chest CT February 16, 2015 FINDINGS: Mediastinum/Lymph Nodes: There is no demonstrable pulmonary embolus. There is prominence of the main pulmonary arteries with rather rapid peripheral tapering, suggesting a degree of pulmonary arterial hypertension. There is prominence of the at ascending thoracic aorta with a maximum transverse diameter of 4.0 x 3.9 cm. There is no thoracic aortic dissection. There are scattered areas of atherosclerotic change in the aorta and proximal great vessels. There is left ventricular hypertrophy. The pericardium is not appreciably thickened. There is scarring in the region of the left lower lobe pulmonary vein. Thyroid appears normal. There are small mediastinal lymph nodes which are stable. There is a right peritracheal lymph node measuring 1.6 x 1.3 cm which is stable compared to the recent prior study. Calcified sub- carinal lymph nodes suggest prior granulomatous disease. There are multiple foci of coronary artery calcification. Lungs/Pleura: There are bullae in the upper lobe regions, larger on the left than on the right. There is fibrotic change in the lung bases, more on left than on the right with traction type bronchiectasis, stable. There is patchy airspace consolidation in both lung bases, more on the right than on the left. Postoperative changes noted in the left lower lobe region. Upper abdomen: In the visualized upper abdomen, there is calcification in the upper abdominal aorta and proximal superior  mesenteric artery. There are subcrural lymph nodes on the right, largest measuring 1.6 x 1 6 cm. There is reflux of contrast into the hepatic veins and inferior vena cava. Musculoskeletal: There is degenerative change in the thoracic spine. There are no blastic or lytic bone lesions. Review of the MIP images confirms the above findings. IMPRESSION: No demonstrable pulmonary embolus. Prominence of ascending thoracic aorta with a maximum transverse diameter 4.0 x 3.9 cm. Recommend annual imaging followup by CTA or MRA. This recommendation follows 2010 ACCF/AHA/AATS/ACR/ASA/SCA/SCAI/SIR/STS/SVM Guidelines for the Diagnosis and Management of Patients with Thoracic Aortic Disease. Circulation. 2010; 121: [id number] of underlying bullous emphysematous type change. There is traction bronchiectasis and fibrosis in the left base. 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 Postoperative change left base. Scarring lower lobe pulmonary vein region on left. Prominence of the central pulmonary arteries with rapid peripheral tapering. Suspect underlying pulmonary arterial hypertension. Left ventricular hypertrophy is noted. There are multiple foci of coronary artery calcification. Reflux of contrast into the inferior vena cava and hepatic veins is suggestive of increased right heart pressure. Mild right paratracheal and right subcrural adenopathy. Electronically Signed   By: WLowella GripIII M.D.   On: 03/25/2015 15:05  No results for input(s): PROCALCITON in the last 168 hours.     ASSESSMENT / PLAN:  1. 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 PE ruled out. Doubt he is really better. No clear cut evidence of flu mediated worsening ILD on CT (he is at risk for ILD flare up due to flu and  nivolumab) but this and ? PFO are leading causes of hypoxemia  PLAN Dc pred. Start IV solumedrol '60mg'$  q6h and reassess in 48 to 72h  Continue empiric antibiotics from 03/22/15 - total 7 days Continue Tamiflu x 5 days (today LOS 5 ) Wean O2 as tolerated by the patient to keep sats >88% to goal of home requirement(2l)    Will benefit from home pall care program    PCCM will round again onday 03/29/15; call earlier if needed     Dr. Brand Males, M.D., [facility].C.P Pulmonary and Critical Care Medicine Staff Physician [place] [facility] Pager: [phone], If no answer or between  15:00h - 7:00h: call [phone]  03/26/2015 1:26 PM

## 2015-03-26 NOTE — Progress Notes (Signed)
Subjective:  Patient denies any chest pain.  States breathing is slightly improved.  Patient had CT angiogram yesterday, which was negative for pulmonary embolism. Objective:  Vital Signs in the last 24 hours: Temp:  [97.4 F (36.3 C)-97.8 F (36.6 C)] 97.8 F (36.6 C) (02/24 0900) Pulse Rate:  [59-80] 70 (02/24 0900) Resp:  [16-18] 16 (02/24 0900) BP: (84-98)/(57-78) 95/67 mmHg (02/24 0900) SpO2:  [90 %-99 %] 96 % (02/24 1100) Weight:  [113.671 kg (250 lb 9.6 oz)] 113.671 kg (250 lb 9.6 oz) (02/24 0400)  Intake/Output from previous day: 02/23 0701 - 02/24 0700 In: 1540 [P.O.:1200; I.V.:240; IV Piggyback:100] Out: 2551 [Urine:2550; Stool:1] Intake/Output from this shift: Total I/O In: 456 [P.O.:360; I.V.:46; IV Piggyback:50] Out: 500 [Urine:500]  Physical Exam: Neck: JVD - 10 cm above sternal notch, no adenopathy, no carotid bruit and supple, symmetrical, trachea midline Lungs: decreased breath sounds at bases with occasional rhonchi and rales noted Heart: regular rate and rhythm, S1, S2 normal and soft systolic murmur and S3 gallop noted Abdomen: soft, non-tender; bowel sounds normal; no masses,  no organomegaly Extremities: no clubbing, cyanosis 3+ edema noted  Lab Results:  Recent Labs  03/25/15 0515  WBC 3.8*  HGB 11.6*  PLT 252    Recent Labs  03/24/15 0230 03/25/15 0515  NA 138 136  K 3.6 3.8  CL 98* 99*  CO2 27 29  GLUCOSE 110* 101*  BUN 51* 46*  CREATININE 1.27* 1.34*   No results for input(s): TROPONINI in the last 72 hours.  Invalid input(s): CK, MB Hepatic Function Panel No results for input(s): PROT, ALBUMIN, AST, ALT, ALKPHOS, BILITOT, BILIDIR, IBILI in the last 72 hours. No results for input(s): CHOL in the last 72 hours. No results for input(s): PROTIME in the last 72 hours.  Imaging: Imaging results have been reviewed and Dg Chest 2 View  03/25/2015  CLINICAL DATA:  70 year old male with shortness of breath and pneumonia. EXAM: CHEST  2  VIEW COMPARISON:  03/21/2015 and prior exams FINDINGS: Cardiomegaly, left Port-A-Cath and bilateral lower lung atelectasis/ airspace disease, left greater than right, again noted. Emphysema changes are again identified. There is no evidence of pneumothorax. There may be small effusions present. IMPRESSION: Little significant change with continued bilateral lower lung atelectasis/ airspace disease, left greater than right. Probable small effusions. Electronically Signed   By: Margarette Canada M.D.   On: 03/25/2015 10:08   Ct Angio Chest Pe W/cm &/or Wo Cm  03/25/2015  CLINICAL DATA:  Progressive shortness of breath. History of lung carcinoma EXAM: CT ANGIOGRAPHY CHEST WITH CONTRAST TECHNIQUE: Multidetector CT imaging of the chest was performed using the standard protocol during bolus administration of intravenous contrast. Multiplanar CT image reconstructions and MIPs were obtained to evaluate the vascular anatomy. CONTRAST:  56m OMNIPAQUE IOHEXOL 350 MG/ML SOLN COMPARISON:  Chest radiograph March 25, 2015; chest CT February 16, 2015 FINDINGS: Mediastinum/Lymph Nodes: There is no demonstrable pulmonary embolus. There is prominence of the main pulmonary arteries with rather rapid peripheral tapering, suggesting a degree of pulmonary arterial hypertension. There is prominence of the at ascending thoracic aorta with a maximum transverse diameter of 4.0 x 3.9 cm. There is no thoracic aortic dissection. There are scattered areas of atherosclerotic change in the aorta and proximal great vessels. There is left ventricular hypertrophy. The pericardium is not appreciably thickened. There is scarring in the region of the left lower lobe pulmonary vein. Thyroid appears normal. There are small mediastinal lymph nodes which are stable. There  is a right peritracheal lymph node measuring 1.6 x 1.3 cm which is stable compared to the recent prior study. Calcified sub- carinal lymph nodes suggest prior granulomatous disease. There  are multiple foci of coronary artery calcification. Lungs/Pleura: There are bullae in the upper lobe regions, larger on the left than on the right. There is fibrotic change in the lung bases, more on left than on the right with traction type bronchiectasis, stable. There is patchy airspace consolidation in both lung bases, more on the right than on the left. Postoperative changes noted in the left lower lobe region. Upper abdomen: In the visualized upper abdomen, there is calcification in the upper abdominal aorta and proximal superior mesenteric artery. There are subcrural lymph nodes on the right, largest measuring 1.6 x 1 6 cm. There is reflux of contrast into the hepatic veins and inferior vena cava. Musculoskeletal: There is degenerative change in the thoracic spine. There are no blastic or lytic bone lesions. Review of the MIP images confirms the above findings. IMPRESSION: No demonstrable pulmonary embolus. Prominence of ascending thoracic aorta with a maximum transverse diameter 4.0 x 3.9 cm. Recommend annual imaging followup by CTA or MRA. This recommendation follows 2010 ACCF/AHA/AATS/ACR/ASA/SCA/SCAI/SIR/STS/SVM Guidelines for the Diagnosis and Management of Patients with Thoracic Aortic Disease. Circulation. 2010; 121: Z610-R604 Evidence of underlying bullous emphysematous type change. There is traction bronchiectasis and fibrosis in the left base. 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 Postoperative change left base. Scarring lower lobe pulmonary vein region on left. Prominence of the central pulmonary arteries with rapid peripheral tapering. Suspect underlying pulmonary arterial hypertension. Left ventricular hypertrophy is noted. There are multiple foci of coronary artery calcification. Reflux of contrast into the inferior vena cava and hepatic veins is suggestive of increased right heart pressure. Mild right paratracheal and right subcrural adenopathy. Electronically Signed    By: Lowella Grip III M.D.   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 Check labs in a.m. Thigh-high TED stockings  LOS: 5 days    [name] 03/26/2015, 11:38 AM

## 2015-03-26 NOTE — Progress Notes (Signed)
Pharmacy Antibiotic Note  [name] is a 70 y.o. male admitted on 03/21/2015 with pneumonia/flu.    Plan for 7d of abx, no fevers overnight, wbc wnl.   Plan: Continue Cefepime 2g q12h (end time of 2/26) Add oseltamivir 75 mg BID x 5 days (end time of 2/25)  Height: '5\' 9"'$  (175.3 cm) Weight: 250 lb 9.6 oz (113.671 kg) IBW/kg (Calculated) : 70.7  Temp (24hrs), Avg:97.6 F (36.4 C), Min:97.4 F (36.3 C), Max:97.8 F (36.6 C)   Recent Labs Lab 03/21/15 1715 03/22/15 0357 03/23/15 0420 03/24/15 0230 03/25/15 0515  WBC 7.8  --   --   --  3.8*  CREATININE 1.47* 1.32* 1.37* 1.27* 1.34*    Estimated Creatinine Clearance: 64.7 mL/min (by C-G formula based on Cr of 1.34).    Allergies  Allergen Reactions  . Codeine Other (See Comments)    Urinary retention    Antimicrobials this admission: Vancomycin 2/19>>2/20  Cefepime 2/19>> 2/26 Oseltamivir 2/20>>2/25  Microbiology results: Flu +  Erin Hearing PharmD., BCPS Clinical Pharmacist Pager [phone] 03/26/2015 10:43 AM

## 2015-03-27 LAB — BASIC METABOLIC PANEL
ANION GAP: 8 (ref 5–15)
BUN: 46 mg/dL — ABNORMAL HIGH (ref 6–20)
CALCIUM: 8.5 mg/dL — AB (ref 8.9–10.3)
CHLORIDE: 99 mmol/L — AB (ref 101–111)
CO2: 29 mmol/L (ref 22–32)
Creatinine, Ser: 1.39 mg/dL — ABNORMAL HIGH (ref 0.61–1.24)
GFR calc non Af Amer: 50 mL/min — ABNORMAL LOW (ref >60)
GFR, EST AFRICAN AMERICAN: 58 mL/min — AB (ref >60)
GLUCOSE: 139 mg/dL — AB (ref 65–99)
Potassium: 4.3 mmol/L (ref 3.5–5.1)
Sodium: 136 mmol/L (ref 135–145)

## 2015-03-27 LAB — CBC
HCT: 40.6 % (ref 39.0–52.0)
HEMOGLOBIN: 11.8 g/dL — AB (ref 13.0–17.0)
MCH: 25.9 pg — AB (ref 26.0–34.0)
MCHC: 29.1 g/dL — AB (ref 30.0–36.0)
MCV: 89.2 fL (ref 78.0–100.0)
Platelets: 232 10*3/uL (ref 150–400)
RBC: 4.55 MIL/uL (ref 4.22–5.81)
RDW: 18.6 % — ABNORMAL HIGH (ref 11.5–15.5)
WBC: 3 10*3/uL — ABNORMAL LOW (ref 4.0–10.5)

## 2015-03-27 LAB — BRAIN NATRIURETIC PEPTIDE: B Natriuretic Peptide: 1216.3 pg/mL — ABNORMAL HIGH (ref 0.0–100.0)

## 2015-03-27 MED ORDER — FUROSEMIDE 10 MG/ML IJ SOLN
40.0000 mg | Freq: Once | INTRAMUSCULAR | Status: AC
  Administered 2015-03-27: 40 mg via INTRAVENOUS
  Filled 2015-03-27: qty 4

## 2015-03-27 MED ORDER — DIGOXIN 125 MCG PO TABS
0.2500 mg | ORAL_TABLET | Freq: Every day | ORAL | Status: DC
  Administered 2015-03-28 – 2015-04-04 (×8): 0.25 mg via ORAL
  Filled 2015-03-27: qty 1
  Filled 2015-03-27 (×4): qty 2
  Filled 2015-03-27 (×2): qty 1
  Filled 2015-03-27: qty 2
  Filled 2015-03-27: qty 1

## 2015-03-27 NOTE — Progress Notes (Signed)
Subjective:  Patient denies any chest pain states breathing appears to have improved slightly after starting IV steroids.  Objective:  Vital Signs in the last 24 hours: Temp:  [97.2 F (36.2 C)-98 F (36.7 C)] 97.2 F (36.2 C) (02/25 1118) Pulse Rate:  [62-88] 70 (02/25 1118) Resp:  [16-22] 20 (02/25 1118) BP: (95-107)/(60-72) 101/72 mmHg (02/25 1118) SpO2:  [86 %-98 %] 97 % (02/25 1118) Weight:  [113.9 kg (251 lb 1.7 oz)] 113.9 kg (251 lb 1.7 oz) (02/25 0414)  Intake/Output from previous day: 02/24 0701 - 02/25 0700 In: 1076 [P.O.:800; I.V.:226; IV Piggyback:50] Out: 3220 [Urine:2445] Intake/Output from this shift: Total I/O In: 370 [P.O.:240; I.V.:30; IV Piggyback:100] Out: -   Physical Exam: Neck: JVD - 10 cm above sternal notch, no adenopathy, no carotid bruit and supple, symmetrical, trachea midline Lungs: Decreased breath sound at bases air entry improved with occasional rhonchi Heart: regular rate and rhythm, S1, S2 normal and Soft systolic murmur and S3 gallop noted Abdomen: soft, non-tender; bowel sounds normal; no masses,  no organomegaly Extremities: No clubbing cyanosis 2+ edema noted  Lab Results:  Recent Labs  03/25/15 0515 03/27/15 0454  WBC 3.8* 3.0*  HGB 11.6* 11.8*  PLT 252 232    Recent Labs  03/25/15 0515 03/27/15 0454  NA 136 136  K 3.8 4.3  CL 99* 99*  CO2 29 29  GLUCOSE 101* 139*  BUN 46* 46*  CREATININE 1.34* 1.39*   No results for input(s): TROPONINI in the last 72 hours.  Invalid input(s): CK, MB Hepatic Function Panel No results for input(s): PROT, ALBUMIN, AST, ALT, ALKPHOS, BILITOT, BILIDIR, IBILI in the last 72 hours. No results for input(s): CHOL in the last 72 hours. No results for input(s): PROTIME in the last 72 hours.  Imaging: Imaging results have been reviewed and Ct Angio Chest Pe W/cm &/or Wo Cm  03/25/2015  CLINICAL DATA:  Progressive shortness of breath. History of lung carcinoma EXAM: CT ANGIOGRAPHY CHEST WITH  CONTRAST TECHNIQUE: Multidetector CT imaging of the chest was performed using the standard protocol during bolus administration of intravenous contrast. Multiplanar CT image reconstructions and MIPs were obtained to evaluate the vascular anatomy. CONTRAST:  78m OMNIPAQUE IOHEXOL 350 MG/ML SOLN COMPARISON:  Chest radiograph March 25, 2015; chest CT February 16, 2015 FINDINGS: Mediastinum/Lymph Nodes: There is no demonstrable pulmonary embolus. There is prominence of the main pulmonary arteries with rather rapid peripheral tapering, suggesting a degree of pulmonary arterial hypertension. There is prominence of the at ascending thoracic aorta with a maximum transverse diameter of 4.0 x 3.9 cm. There is no thoracic aortic dissection. There are scattered areas of atherosclerotic change in the aorta and proximal great vessels. There is left ventricular hypertrophy. The pericardium is not appreciably thickened. There is scarring in the region of the left lower lobe pulmonary vein. Thyroid appears normal. There are small mediastinal lymph nodes which are stable. There is a right peritracheal lymph node measuring 1.6 x 1.3 cm which is stable compared to the recent prior study. Calcified sub- carinal lymph nodes suggest prior granulomatous disease. There are multiple foci of coronary artery calcification. Lungs/Pleura: There are bullae in the upper lobe regions, larger on the left than on the right. There is fibrotic change in the lung bases, more on left than on the right with traction type bronchiectasis, stable. There is patchy airspace consolidation in both lung bases, more on the right than on the left. Postoperative changes noted in the left lower lobe region.  Upper abdomen: In the visualized upper abdomen, there is calcification in the upper abdominal aorta and proximal superior mesenteric artery. There are subcrural lymph nodes on the right, largest measuring 1.6 x 1 6 cm. There is reflux of contrast into the  hepatic veins and inferior vena cava. Musculoskeletal: There is degenerative change in the thoracic spine. There are no blastic or lytic bone lesions. Review of the MIP images confirms the above findings. IMPRESSION: No demonstrable pulmonary embolus. Prominence of ascending thoracic aorta with a maximum transverse diameter 4.0 x 3.9 cm. Recommend annual imaging followup by CTA or MRA. This recommendation follows 2010 ACCF/AHA/AATS/ACR/ASA/SCA/SCAI/SIR/STS/SVM Guidelines for the Diagnosis and Management of Patients with Thoracic Aortic Disease. Circulation. 2010; 121: N629-B284 Evidence of underlying bullous emphysematous type change. There is traction bronchiectasis and fibrosis in the left base. 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 Postoperative change left base. Scarring lower lobe pulmonary vein region on left. Prominence of the central pulmonary arteries with rapid peripheral tapering. Suspect underlying pulmonary arterial hypertension. Left ventricular hypertrophy is noted. There are multiple foci of coronary artery calcification. Reflux of contrast into the inferior vena cava and hepatic veins is suggestive of increased right heart pressure. Mild right paratracheal and right subcrural adenopathy. Electronically Signed   By: Lowella Grip III M.D.   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

## 2015-03-28 MED ORDER — LEVALBUTEROL HCL 0.63 MG/3ML IN NEBU: 0.6300 mg | INHALATION_SOLUTION | Freq: Four times a day (QID) | RESPIRATORY_TRACT | Status: DC | PRN

## 2015-03-28 NOTE — Progress Notes (Signed)
Subjective:  Denies any chest pain still complains of shortness of breath with walking to the bathroom. States breathing has improved at rest. No hemoptysis.. Diuresing well and  Objective:  Vital Signs in the last 24 hours: Temp:  [97.3 F (36.3 C)-97.6 F (36.4 C)] 97.3 F (36.3 C) (02/26 0736) Pulse Rate:  [64-76] 64 (02/26 0736) Resp:  [16] 16 (02/26 0736) BP: (94-101)/(63-79) 101/70 mmHg (02/26 0736) SpO2:  [81 %-98 %] 98 % (02/26 0736) Weight:  [112.8 kg (248 lb 10.9 oz)] 112.8 kg (248 lb 10.9 oz) (02/26 0600)  Intake/Output from previous day: 02/25 0701 - 02/26 0700 In: 790 [P.O.:480; I.V.:210; IV Piggyback:100] Out: 3600 [Urine:3600] Intake/Output from this shift: Total I/O In: 280 [P.O.:240; I.V.:40] Out: -   Physical Exam: Neck: JVD - 10 cm above sternal notch, no adenopathy, no carotid bruit, supple, symmetrical, trachea midline and thyroid not enlarged, symmetric, no tenderness/mass/nodules Lungs: Decreased breath sound at bases with occasional rhonchi Heart: regular rate and rhythm, S1, S2 normal and Soft systolic murmur and S3 gallop noted Abdomen: soft, non-tender; bowel sounds normal; no masses,  no organomegaly Extremities: No clubbing cyanosis 2+ edema noted  Lab Results:  Recent Labs  03/27/15 0454  WBC 3.0*  HGB 11.8*  PLT 232    Recent Labs  03/27/15 0454  NA 136  K 4.3  CL 99*  CO2 29  GLUCOSE 139*  BUN 46*  CREATININE 1.39*   No results for input(s): TROPONINI in the last 72 hours.  Invalid input(s): CK, MB Hepatic Function Panel No results for input(s): PROT, ALBUMIN, AST, ALT, ALKPHOS, BILITOT, BILIDIR, IBILI in the last 72 hours. No results for input(s): CHOL in the last 72 hours. No results for input(s): PROTIME in the last 72 hours.  Imaging: Imaging results have been reviewed and No results found.  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  LOS: 7 days    [name] 03/28/2015, 12:20 PM

## 2015-03-29 ENCOUNTER — Telehealth: Payer: Self-pay | Admitting: Internal Medicine

## 2015-03-29 ENCOUNTER — Telehealth: Payer: Self-pay

## 2015-03-29 ENCOUNTER — Inpatient Hospital Stay (HOSPITAL_COMMUNITY): Payer: Commercial Managed Care - HMO

## 2015-03-29 ENCOUNTER — Other Ambulatory Visit: Payer: Self-pay | Admitting: Medical Oncology

## 2015-03-29 DIAGNOSIS — J9600 Acute respiratory failure, unspecified whether with hypoxia or hypercapnia: Secondary | ICD-10-CM

## 2015-03-29 DIAGNOSIS — I5023 Acute on chronic systolic (congestive) heart failure: Secondary | ICD-10-CM

## 2015-03-29 DIAGNOSIS — J189 Pneumonia, unspecified organism: Secondary | ICD-10-CM

## 2015-03-29 DIAGNOSIS — J111 Influenza due to unidentified influenza virus with other respiratory manifestations: Secondary | ICD-10-CM

## 2015-03-29 LAB — BASIC METABOLIC PANEL
Anion gap: 10 (ref 5–15)
BUN: 58 mg/dL — AB (ref 6–20)
CO2: 30 mmol/L (ref 22–32)
CREATININE: 1.53 mg/dL — AB (ref 0.61–1.24)
Calcium: 8.7 mg/dL — ABNORMAL LOW (ref 8.9–10.3)
Chloride: 93 mmol/L — ABNORMAL LOW (ref 101–111)
GFR calc non Af Amer: 45 mL/min — ABNORMAL LOW (ref >60)
GFR, EST AFRICAN AMERICAN: 52 mL/min — AB (ref >60)
Glucose, Bld: 166 mg/dL — ABNORMAL HIGH (ref 65–99)
Potassium: 3.7 mmol/L (ref 3.5–5.1)
SODIUM: 133 mmol/L — AB (ref 135–145)

## 2015-03-29 LAB — BRAIN NATRIURETIC PEPTIDE: B Natriuretic Peptide: 707.3 pg/mL — ABNORMAL HIGH (ref 0.0–100.0)

## 2015-03-29 MED ORDER — METHYLPREDNISOLONE SODIUM SUCC 40 MG IJ SOLR
40.0000 mg | Freq: Four times a day (QID) | INTRAMUSCULAR | Status: DC
  Administered 2015-03-29 – 2015-03-30 (×5): 40 mg via INTRAVENOUS
  Filled 2015-03-29 (×5): qty 1

## 2015-03-29 NOTE — Progress Notes (Signed)
Subjective:  Patient denies any chest pain complaints of shortness of breath with minimal exertion.  Objective:  Vital Signs in the last 24 hours: Temp:  [97.3 F (36.3 C)-98 F (36.7 C)] 97.3 F (36.3 C) (02/27 0840) Pulse Rate:  [57-73] 68 (02/27 0839) Resp:  [18] 18 (02/26 1647) BP: (90-106)/(62-74) 90/62 mmHg (02/26 1933) SpO2:  [90 %-97 %] 96 % (02/27 0723) Weight:  [113.2 kg (249 lb 9 oz)] 113.2 kg (249 lb 9 oz) (02/27 0500)  Intake/Output from previous day: 02/26 0701 - 02/27 0700 In: 810 [P.O.:720; I.V.:90] Out: 3600 [Urine:3600] Intake/Output from this shift: Total I/O In: -  Out: 750 [Urine:750]  Physical Exam: Neck: JVD - 8 cm above sternal notch, no adenopathy, no carotid bruit, supple, symmetrical, trachea midline and thyroid not enlarged, symmetric, no tenderness/mass/nodules Lungs: Decreased breath sound at bases air entry has improved Heart: regular rate and rhythm, S1: Soft and Soft systolic murmur noted Abdomen: soft, non-tender; bowel sounds normal; no masses,  no organomegaly Extremities: No clubbing cyanosis 2+ edema noted  Lab Results:  Recent Labs  03/27/15 0454  WBC 3.0*  HGB 11.8*  PLT 232    Recent Labs  03/27/15 0454 03/29/15 0549  NA 136 133*  K 4.3 3.7  CL 99* 93*  CO2 29 30  GLUCOSE 139* 166*  BUN 46* 58*  CREATININE 1.39* 1.53*   No results for input(s): TROPONINI in the last 72 hours.  Invalid input(s): CK, MB Hepatic Function Panel No results for input(s): PROT, ALBUMIN, AST, ALT, ALKPHOS, BILITOT, BILIDIR, IBILI in the last 72 hours. No results for input(s): CHOL in the last 72 hours. No results for input(s): PROTIME in the last 72 hours.  Imaging: Imaging results have been reviewed and No results found.  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

## 2015-03-29 NOTE — Telephone Encounter (Signed)
Patient's son called regarding patient's upcoming appt on Thursday with Dr.Mohamed.  Patient is currently admitted with the flu and pneumonia.  Patient's son is not sure that patient with be discharged by Thursday and was wondering whether the appts should be cancelled.

## 2015-03-29 NOTE — Telephone Encounter (Signed)
I told [name] that pt appts will be cancelled for this week and will be r/s in two weeks.

## 2015-03-29 NOTE — Telephone Encounter (Signed)
Spoke with patient to confirm r/s appt 3/2 to 3/16 date/time

## 2015-03-29 NOTE — Progress Notes (Signed)
Name: [name] MRN: [id number] DOB: February 15, 1945    ADMISSION DATE:  03/21/2015 CONSULTATION DATE:  03/23/15  REFERRING MD : Terrence Dupont  CHIEF COMPLAINT: SOB  BRIEF PATIENT DESCRIPTION: 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  PCCM was consulted on 03/23/15 for increased SOB and therefore increased demands of oxygen.   STUDIES: and EVENTS  Influenza A  Was positive >03/22/15   2/23 - does not feel better despite dopamine, prednisone, tamiflu and antibiotics. Easy desats. On 6L at rest. When walks need face mask 03/26/15 - feeling better but stil on high flow o2. CTA ruled out PE . 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 Also reports beeing on nivolumab since oct 2016 - can cause worsening ILD   SUBJECTIVE/OVERNIGHT/INTERVAL HX desat with PT  VITAL SIGNS: Temp:  [97.3 F (36.3 C)-98 F (36.7 C)] 97.4 F (36.3 C) (02/27 1226) Pulse Rate:  [57-73] 68 (02/27 0839) Resp:  [16-18] 16 (02/27 1226) BP: (90-103)/(62-74) 90/62 mmHg (02/26 1933) SpO2:  [90 %-97 %] 96 % (02/27 0723) Weight:  [113.2 kg (249 lb 9 oz)] 113.2 kg (249 lb 9 oz) (02/27 0500)  PHYSICAL EXAMINATION: General: Morbidly obese gentleman , sitting chair, no distress Neuro:  Alert and oriented, normocephalic, atraumatic HEENT:clear sclera. Moist mucus memberane. )2 needs lowerr o5 liters Cardiovascular:S1,S2 heart sound,rrr, No MRG Lungs: reduced Abdomen: obese, non tender, bs positive.   Skin:  No rashes, ecchymosis noted MSK 3+ EDEMA - remains   PULMONARY No results for input(s): PHART, PCO2ART,  PO2ART, HCO3, TCO2, O2SAT in the last 168 hours.  Invalid input(s): PCO2, PO2  CBC  Recent Labs Lab 03/25/15 0515 03/27/15 0454  HGB 11.6* 11.8*  HCT 40.5 40.6  WBC 3.8* 3.0*  PLT 252 232    COAGULATION No results for input(s): INR in the last 168 hours.  CARDIAC   No results for input(s): TROPONINI in the last 168 hours. No results for input(s): PROBNP in the last 168 hours.   CHEMISTRY  Recent Labs Lab 03/23/15 0420 03/24/15 0230 03/25/15 0515 03/27/15 0454 03/29/15 0549  NA 136 138 136 136 133*  K 3.8 3.6 3.8 4.3 3.7  CL 102 98* 99* 99* 93*  CO2 '29 27 29 29 30  '$ GLUCOSE 114* 110* 101* 139* 166*  BUN 56* 51* 46* 46* 58*  CREATININE 1.37* 1.27* 1.34* 1.39* 1.53*  CALCIUM 8.2* 8.1* 8.1* 8.5* 8.7*   Estimated Creatinine Clearance: 56.5 mL/min (by C-G formula based on Cr of 1.53).   LIVER No results for input(s): AST, ALT, ALKPHOS, BILITOT, PROT, ALBUMIN, INR in the last 168 hours.   INFECTIOUS No results for input(s): LATICACIDVEN, PROCALCITON in the last 168 hours.   ENDOCRINE CBG (last 3)  No results for input(s): GLUCAP in the last 72 hours.   IMAGING x48h  - image(s) personally visualized  -   highlighted in bold No results found.  No results for input(s): PROCALCITON in the last 168 hours.   ASSESSMENT / PLAN:  1. 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 Titus Mould, MD, [facility] Pgr: [facility]
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
## 2015-03-30 DIAGNOSIS — J9621 Acute and chronic respiratory failure with hypoxia: Secondary | ICD-10-CM | POA: Insufficient documentation

## 2015-03-30 LAB — SEDIMENTATION RATE: Sed Rate: 17 mm/hr — ABNORMAL HIGH (ref 0–16)

## 2015-03-30 MED ORDER — METHYLPREDNISOLONE SODIUM SUCC 40 MG IJ SOLR
40.0000 mg | Freq: Two times a day (BID) | INTRAMUSCULAR | Status: DC
  Administered 2015-03-31 – 2015-04-01 (×3): 40 mg via INTRAVENOUS
  Filled 2015-03-30 (×4): qty 1

## 2015-03-30 NOTE — Progress Notes (Signed)
Subjective:  Appreciate CCM consult and help. Patient states breathing is slowly improving no chest pain or hemoptysis.  Objective:  Vital Signs in the last 24 hours: Temp:  [97.4 F (36.3 C)-97.8 F (36.6 C)] 97.8 F (36.6 C) (02/28 0738) Pulse Rate:  [55-83] 73 (02/28 0900) Resp:  [16] 16 (02/27 2335) BP: (93-100)/(65-71) 98/65 mmHg (02/28 0350) SpO2:  [91 %-100 %] 95 % (02/28 0905) Weight:  [113.036 kg (249 lb 3.2 oz)] 113.036 kg (249 lb 3.2 oz) (02/28 0348)  Intake/Output from previous day: 02/27 0701 - 02/28 0700 In: 420 [P.O.:420] Out: 3350 [Urine:3350] Intake/Output from this shift:    Physical Exam: Neck: JVD - 8 cm above sternal notch, no adenopathy, no carotid bruit and supple, symmetrical, trachea midline Lungs: Decreased breath sound at bases with occasional rhonchi Heart: regular rate and rhythm, S1, S2 normal and Soft systolic murmur noted Abdomen: soft, non-tender; bowel sounds normal; no masses,  no organomegaly Extremities: No clubbing cyanosis 2+ edema noted  Lab Results: No results for input(s): WBC, HGB, PLT in the last 72 hours.  Recent Labs  03/29/15 0549  NA 133*  K 3.7  CL 93*  CO2 30  GLUCOSE 166*  BUN 58*  CREATININE 1.53*   No results for input(s): TROPONINI in the last 72 hours.  Invalid input(s): CK, MB Hepatic Function Panel No results for input(s): PROT, ALBUMIN, AST, ALT, ALKPHOS, BILITOT, BILIDIR, IBILI in the last 72 hours. No results for input(s): CHOL in the last 72 hours. No results for input(s): PROTIME in the last 72 hours.  Imaging: Imaging results have been reviewed and Dg Chest Port 1 View  03/29/2015  CLINICAL DATA:  Follow-up usual interstitial pneumonitis EXAM: PORTABLE CHEST 1 VIEW COMPARISON:  03/25/2015 FINDINGS: Cardiomegaly again noted. Left Port-A-Cath is unchanged in position. Mild emphysematous changes again noted. Improvement in aeration right base without residual infiltrate. Persistent small residual  infiltrate or fibrotic changes left base with improvement in aeration from prior exam. There is no pulmonary edema. IMPRESSION: Mild emphysematous changes again noted. Improvement in aeration right base without residual infiltrate. Persistent small residual infiltrate or fibrotic changes left base with improvement in aeration from prior exam. There is no pulmonary edema. Cardiomegaly again noted. Stable left Port-A-Cath position. Electronically Signed   By: Lahoma Crocker M.D.   On: 03/29/2015 13:11    Cardiac Studies:  Assessment/Plan:  Resolving Acute on chronic hypoxic respiratory failure Acute on chronic left heart and right heart failure Severe tricuspid regurgitation Pulmonary hypertension influenza pneumonia Recurrent non-small cell lung cancer s/p chemotherapy/Immunotherapy CKD, II Morbid obesity Hypertension Pulmonary fibrosis/interstitial lung disease with hypertension COPD Remote tobacco abuse Chronic venous insufficiency Plan Continue present management Increase ambulation as tolerated Wean down O2 as tolerated  LOS: 9 days    [name] 03/30/2015, 9:24 AM

## 2015-03-30 NOTE — Progress Notes (Signed)
Name: [name] MRN: [id number] DOB: 05-04-1945    ADMISSION DATE:  03/21/2015 CONSULTATION DATE:  03/23/15  REFERRING MD : Terrence Dupont  CHIEF COMPLAINT: SOB  BRIEF PATIENT DESCRIPTION: 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  PCCM was consulted on 03/23/15 for increased SOB and therefore increased demands of oxygen.   STUDIES: and EVENTS  Influenza A  Was positive >03/22/15   2/23 - does not feel better despite dopamine, prednisone, tamiflu and antibiotics. Easy desats. On 6L at rest. When walks need face mask 03/26/15 - feeling better but stil on high flow o2. CTA ruled out PE . 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 Also reports beeing on nivolumab since oct 2016 - can cause worsening ILD   SUBJECTIVE/OVERNIGHT/INTERVAL HX Feeling better daily Neg 2.9 liters  VITAL SIGNS: Temp:  [97.5 F (36.4 C)-98 F (36.7 C)] 98 F (36.7 C) (02/28 1115) Pulse Rate:  [55-133] 61 (02/28 1400) Resp:  [16-20] 20 (02/28 1115) BP: (93-100)/(65-71) 98/65 mmHg (02/28 0350) SpO2:  [91 %-100 %] 100 % (02/28 1433) Weight:  [113.036 kg (249 lb 3.2 oz)] 113.036 kg (249 lb 3.2 oz) (02/28 0348)  PHYSICAL EXAMINATION: General: Morbidly obese gentleman , no distress Neuro:  Alert and oriented, jvd present HEENT:  Neck jvd noted Cardiovascular:S1,S2 heart sound,rrr, No MRG Lungs: reduced less coarse, no wheezing Abdomen: obese, non tender, bs positive.   Skin:  No rashes, ecchymosis noted MSK 2+ EDEMA - remains   PULMONARY No results for input(s): PHART, PCO2ART, PO2ART, HCO3,  TCO2, O2SAT in the last 168 hours.  Invalid input(s): PCO2, PO2  CBC  Recent Labs Lab 03/25/15 0515 03/27/15 0454  HGB 11.6* 11.8*  HCT 40.5 40.6  WBC 3.8* 3.0*  PLT 252 232    COAGULATION No results for input(s): INR in the last 168 hours.  CARDIAC   No results for input(s): TROPONINI in the last 168 hours. No results for input(s): PROBNP in the last 168 hours.   CHEMISTRY  Recent Labs Lab 03/24/15 0230 03/25/15 0515 03/27/15 0454 03/29/15 0549  NA 138 136 136 133*  K 3.6 3.8 4.3 3.7  CL 98* 99* 99* 93*  CO2 '27 29 29 30  '$ GLUCOSE 110* 101* 139* 166*  BUN 51* 46* 46* 58*  CREATININE 1.27* 1.34* 1.39* 1.53*  CALCIUM 8.1* 8.1* 8.5* 8.7*   Estimated Creatinine Clearance: 56.5 mL/min (by C-G formula based on Cr of 1.53).   LIVER No results for input(s): AST, ALT, ALKPHOS, BILITOT, PROT, ALBUMIN, INR in the last 168 hours.   INFECTIOUS No results for input(s): LATICACIDVEN, PROCALCITON in the last 168 hours.   ENDOCRINE CBG (last 3)  No results for input(s): GLUCAP in the last 72 hours.   IMAGING x48h  - image(s) personally visualized  -   highlighted in bold Dg Chest Port 1 View  03/29/2015  CLINICAL DATA:  Follow-up usual interstitial pneumonitis EXAM: PORTABLE CHEST 1 VIEW COMPARISON:  03/25/2015 FINDINGS: Cardiomegaly again noted. Left Port-A-Cath is unchanged in position. Mild emphysematous changes again noted. Improvement in aeration right base without residual infiltrate. Persistent small residual infiltrate or fibrotic changes left  base with improvement in aeration from prior exam. There is no pulmonary edema. IMPRESSION: Mild emphysematous changes again noted. Improvement in aeration right base without residual infiltrate. Persistent small residual infiltrate or fibrotic changes left base with improvement in aeration from prior exam. There is no pulmonary edema. Cardiomegaly again noted. Stable left Port-A-Cath position. Electronically Signed   By: Lahoma Crocker M.D.   On: 03/29/2015 13:11    No results for input(s): PROCALCITON in the last 168 hours.   ASSESSMENT / PLAN:  1. 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 Titus Mould, MD, [place] Pgr: [place] [facility]

## 2015-03-31 LAB — BASIC METABOLIC PANEL
ANION GAP: 10 (ref 5–15)
BUN: 64 mg/dL — ABNORMAL HIGH (ref 6–20)
CO2: 33 mmol/L — ABNORMAL HIGH (ref 22–32)
Calcium: 8.8 mg/dL — ABNORMAL LOW (ref 8.9–10.3)
Chloride: 93 mmol/L — ABNORMAL LOW (ref 101–111)
Creatinine, Ser: 1.38 mg/dL — ABNORMAL HIGH (ref 0.61–1.24)
GFR calc Af Amer: 59 mL/min — ABNORMAL LOW (ref >60)
GFR, EST NON AFRICAN AMERICAN: 51 mL/min — AB (ref >60)
GLUCOSE: 150 mg/dL — AB (ref 65–99)
POTASSIUM: 3.8 mmol/L (ref 3.5–5.1)
SODIUM: 136 mmol/L (ref 135–145)

## 2015-03-31 LAB — MAGNESIUM: MAGNESIUM: 2 mg/dL (ref 1.7–2.4)

## 2015-03-31 LAB — PHOSPHORUS: PHOSPHORUS: 3.6 mg/dL (ref 2.5–4.6)

## 2015-03-31 NOTE — Care Management Note (Addendum)
Case Management Note  Patient Details  Name: [name] MRN: [id number] Date of Birth: [birth year]  Subjective/Objective:  70 y.o. M admitted to SDU 03/21/2015 for Worsening SOB in the setting of Pneumonia/Flu. Pt has hx [place] carcinoma of lung with poss Mets Spine. S/P Lobectomy w Radiation, Immunotherapy recent reocurence. Lived alone in private residence prior to admit.                   Action/Plan: NO PT/OT follow up recommended. CM will continue to follow for discharge/Disposition needs. Home Oxygen 2L. Will be continued, pt unsure of provider will have son look at home tank and tell this CM in the am.     Expected Discharge Date:                  Expected Discharge Plan:  [place]  In-House Referral:     Discharge planning Services     Post Acute Care Choice:  Resumption of Svcs/PTA Provider Choice offered to:     DME Arranged:    DME Agency:     HH Arranged:  RN, Disease Management [place] Agency:  [place]  Status of Service:     Medicare Important Message Given:    Date Medicare IM Given:    Medicare IM give by:    Date Additional Medicare IM Given:    Additional Medicare Important Message give by:     If discussed at [place] of Stay Meetings, dates discussed:    Additional Comments:  Delrae Sawyers, RN 03/31/2015, 3:02 PM

## 2015-03-31 NOTE — Progress Notes (Signed)
Transferred pt to 3E at this time.  Still no s/s of any acute distress.
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
## 2015-03-31 NOTE — Progress Notes (Signed)
Subjective:  Patient denies any anginal chest pains to his breathing is slowly improving.  Objective:  Vital Signs in the last 24 hours: Temp:  [97.3 F (36.3 C)-98 F (36.7 C)] 97.5 F (36.4 C) (03/01 0800) Pulse Rate:  [59-133] 63 (03/01 0900) Resp:  [20] 20 (02/28 1115) BP: (91-94)/(61-68) 94/68 mmHg (03/01 0430) SpO2:  [93 %-100 %] 99 % (03/01 0904) Weight:  [111.585 kg (246 lb)] 111.585 kg (246 lb) (03/01 0400)  Intake/Output from previous day: 02/28 0701 - 03/01 0700 In: 1340 [P.O.:720; I.V.:620] Out: 3725 [Urine:3725] Intake/Output from this shift: Total I/O In: 440 [P.O.:420; I.V.:20] Out: -   Physical Exam: Neck: JVD - 8 cm above sternal notch, no adenopathy, no carotid bruit and supple, symmetrical, trachea midline Lungs: decreased breath sounds at bases with occasional bilateral rhonchi and rales Heart: regular rate and rhythm, S1, S2 normal and soft systolic murmur noted Abdomen: soft, non-tender; bowel sounds normal; no masses,  no organomegaly Extremities: no clubbing, cyanosis, 2+ edema noted  Lab Results: No results for input(s): WBC, HGB, PLT in the last 72 hours.  Recent Labs  03/29/15 0549 03/31/15 0400  NA 133* 136  K 3.7 3.8  CL 93* 93*  CO2 30 33*  GLUCOSE 166* 150*  BUN 58* 64*  CREATININE 1.53* 1.38*   No results for input(s): TROPONINI in the last 72 hours.  Invalid input(s): CK, MB Hepatic Function Panel No results for input(s): PROT, ALBUMIN, AST, ALT, ALKPHOS, BILITOT, BILIDIR, IBILI in the last 72 hours. No results for input(s): CHOL in the last 72 hours. No results for input(s): PROTIME in the last 72 hours.  Imaging: Imaging results have been reviewed and Dg Chest Port 1 View  03/29/2015  CLINICAL DATA:  Follow-up usual interstitial pneumonitis EXAM: PORTABLE CHEST 1 VIEW COMPARISON:  03/25/2015 FINDINGS: Cardiomegaly again noted. Left Port-A-Cath is unchanged in position. Mild emphysematous changes again noted. Improvement in  aeration right base without residual infiltrate. Persistent small residual infiltrate or fibrotic changes left base with improvement in aeration from prior exam. There is no pulmonary edema. IMPRESSION: Mild emphysematous changes again noted. Improvement in aeration right base without residual infiltrate. Persistent small residual infiltrate or fibrotic changes left base with improvement in aeration from prior exam. There is no pulmonary edema. Cardiomegaly again noted. Stable left Port-A-Cath position. Electronically Signed   By: Lahoma Crocker M.D.   On: 03/29/2015 13:11    Cardiac Studies:  Assessment/Plan:  Resolving Acute on chronic hypoxic respiratory failure Acute on chronic left heart and right heart failure Severe tricuspid regurgitation Pulmonary hypertension influenza pneumonia Recurrent non-small cell lung cancer s/p chemotherapy/Immunotherapy CKD, II Morbid obesity Hypertension Pulmonary fibrosis/interstitial lung disease with hypertension COPD Remote tobacco abuse Chronic venous insufficiency Plan Increase ambulation as tolerated. Transfer to telemetry  LOS: 10 days    [name] 03/31/2015, 9:39 AM

## 2015-03-31 NOTE — Progress Notes (Signed)
Report given to [place] on [address] [place] at this time.  Pt has no s/s of any acute distress or c/o pain.

## 2015-04-01 ENCOUNTER — Other Ambulatory Visit: Payer: Commercial Managed Care - HMO

## 2015-04-01 ENCOUNTER — Ambulatory Visit: Payer: Commercial Managed Care - HMO | Admitting: Internal Medicine

## 2015-04-01 ENCOUNTER — Ambulatory Visit: Payer: Commercial Managed Care - HMO

## 2015-04-01 DIAGNOSIS — J849 Interstitial pulmonary disease, unspecified: Secondary | ICD-10-CM

## 2015-04-01 MED ORDER — METHYLPREDNISOLONE SODIUM SUCC 40 MG IJ SOLR
40.0000 mg | Freq: Every day | INTRAMUSCULAR | Status: DC
  Administered 2015-04-02: 40 mg via INTRAVENOUS
  Filled 2015-04-01: qty 1

## 2015-04-01 MED ORDER — FUROSEMIDE 10 MG/ML IJ SOLN
60.0000 mg | Freq: Two times a day (BID) | INTRAMUSCULAR | Status: DC
  Administered 2015-04-01 – 2015-04-04 (×6): 60 mg via INTRAVENOUS
  Filled 2015-04-01 (×6): qty 6

## 2015-04-01 NOTE — Progress Notes (Addendum)
Name: [name] MRN: [id number] DOB: 1945/07/25    ADMISSION DATE:  03/21/2015 CONSULTATION DATE:  03/23/15  REFERRING MD : Terrence Dupont  CHIEF COMPLAINT: SOB  BRIEF PATIENT DESCRIPTION: [name] is a 70 year old male with history of recurrent non-small cell lung carcinoma status post left lobectomy, on immunotherapy, COPD, interstitial lung disease, severe pulmonary hypertension with pulmonary fibrosis, CHF with preserved systolic function, OSA, stage IV kidney disease, and chronic respiratory failure on home 2 L Supplemental oxygen , who presents on 03/21/15 with shortness of breath.CXR was concerning for pneumonia/flu.  PCCM was consulted on 03/23/15 for increased SOB and therefore increased demands of oxygen.   STUDIES: and EVENTS  Influenza A  Was positive >03/22/15  2/23 - does not feel better despite dopamine, prednisone, tamiflu and antibiotics. Easy desats. On 6L at rest. When walks need face mask 03/26/15 - feeling better but stil on high flow o2. CTA ruled out PE . 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 Also reports beeing on nivolumab since oct 2016 - can cause worsening ILD    SUBJECTIVE/OVERNIGHT/INTERVAL HX 04/01/15: Patient says"feels much better today, getting better every day".  Patient said his overall swelling has gone down but feet are still swollen.  VITAL SIGNS: Temp:  [97.4 F (36.3 C)-97.9 F (36.6 C)] 97.5 F (36.4 C) (03/02 0827) Pulse Rate:  [62-73] 62 (03/02 0827) Resp:  [18-20] 20 (03/02 0827) BP: (93-110)/(58-87) 96/58 mmHg (03/02 0827) SpO2:  [93 %-100 %] 93 % (03/02 0858) Weight:  [243 lb 8 oz (110.451 kg)-245 lb 9.5 oz (111.4 kg)] 243 lb 8 oz (110.451 kg) (03/02 0626)  PHYSICAL EXAMINATION: General: Morbidly obese gentleman , no distress Neuro:  Alert and oriented, answers appropriately to all questions HEENT:  Atraumatic, normocephalic, clear sclera, no discharge Cardiovascular:S1,S2 heart sound,rrr, No MRG Lungs:  diminished bases, no wheezing, no crackles Abdomen: obese, non tender, bs positive.   Skin:  No rashes, ecchymosis noted MSK - +1 pitting edema noted on B/l LE   PULMONARY No results for input(s): PHART, PCO2ART, PO2ART, HCO3, TCO2, O2SAT in the last 168 hours.  Invalid input(s): PCO2, PO2  CBC  Recent Labs Lab 03/27/15 0454  HGB 11.8*  HCT 40.6  WBC 3.0*  PLT 232    COAGULATION No results for input(s): INR in the last 168 hours.  CARDIAC   No results for input(s): TROPONINI in the last 168 hours. No results for input(s): PROBNP in the last 168 hours.   CHEMISTRY  Recent Labs Lab 03/27/15 0454 03/29/15 0549 03/31/15 0400  NA 136 133* 136  K 4.3 3.7 3.8  CL 99* 93* 93*  CO2 29 30 33*  GLUCOSE 139* 166* 150*  BUN 46* 58* 64*  CREATININE 1.39* 1.53* 1.38*  CALCIUM 8.5* 8.7* 8.8*  MG  --   --  2.0  PHOS  --   --  3.6   Estimated Creatinine Clearance: 61.9 mL/min (by C-G formula based on Cr of 1.38).   LIVER No results for input(s): AST, ALT, ALKPHOS, BILITOT, PROT, ALBUMIN, INR in the last 168 hours.   INFECTIOUS No results for input(s): LATICACIDVEN, PROCALCITON in the last 168 hours.   ENDOCRINE CBG (last 3)  No results for input(s): GLUCAP in the last 72 hours.   IMAGING x48h  - image(s) personally visualized  -   highlighted in bold No results found.  No results for input(s): PROCALCITON in the last 168 hours.   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,      Pulmonary HTN      Anasarca- Improved     PLAN  Improved Oxygen demands  Follow up with CXR in am Creatinine is elevated but trending down Continue steroids Net negative for 15l, trend BUN and creatine lung cancer, int lung dz, fibrosis, emphysematous changes all explain his pulm HTN Need Home O2 upon discharge    Bincy Varughese,AG-ACNP Pulmonary & Critical  Care   Attending - FLu A positive, diuresing well with lasix, hopefully hypoxia should improve back to baseline, he was on immunotherapy for advanced adenoCA , last CT stable 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 MD [phone]

## 2015-04-01 NOTE — Progress Notes (Signed)
Subjective:  Patient denies any chest pain.  States breathing is slowly improving.  Denies fever or chills.  Objective:  Vital Signs in the last 24 hours: Temp:  [97.4 F (36.3 C)-97.9 F (36.6 C)] 97.5 F (36.4 C) (03/02 0827) Pulse Rate:  [62-73] 62 (03/02 0827) Resp:  [18-20] 20 (03/02 0827) BP: (93-110)/(58-87) 96/58 mmHg (03/02 0827) SpO2:  [93 %-100 %] 93 % (03/02 0858) Weight:  [110.451 kg (243 lb 8 oz)-111.4 kg (245 lb 9.5 oz)] 110.451 kg (243 lb 8 oz) (03/02 0626)  Intake/Output from previous day: 03/01 0701 - 03/02 0700 In: 1410 [P.O.:1260; I.V.:150] Out: 2325 [Urine:2325] Intake/Output from this shift:    Physical Exam: Neck: JVD - 8 cm above sternal notch, no adenopathy, no carotid bruit and supple, symmetrical, trachea midline Lungs: decreased breath sounds at bases with occasional rhonchi noted Heart: regular rate and rhythm, S1, S2 normal and soft systolic murmur noted Abdomen: soft, non-tender; bowel sounds normal; no masses,  no organomegaly Extremities: 2+ edema persist  Lab Results: No results for input(s): WBC, HGB, PLT in the last 72 hours.  Recent Labs  03/31/15 0400  NA 136  K 3.8  CL 93*  CO2 33*  GLUCOSE 150*  BUN 64*  CREATININE 1.38*   No results for input(s): TROPONINI in the last 72 hours.  Invalid input(s): CK, MB Hepatic Function Panel No results for input(s): PROT, ALBUMIN, AST, ALT, ALKPHOS, BILITOT, BILIDIR, IBILI in the last 72 hours. No results for input(s): CHOL in the last 72 hours. No results for input(s): PROTIME in the last 72 hours.  Imaging: Imaging results have been reviewed and No results found.  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 Check renal function in a.m.  LOS: 11 days    [name] 04/01/2015, 9:37 AM

## 2015-04-01 NOTE — Progress Notes (Deleted)
1130 no scheduled nuclear med dept . MD notified and seen the pt spoke with pt and spouse . Will clarify with cardiologist

## 2015-04-02 LAB — BASIC METABOLIC PANEL
Anion gap: 13 (ref 5–15)
BUN: 63 mg/dL — ABNORMAL HIGH (ref 6–20)
CHLORIDE: 92 mmol/L — AB (ref 101–111)
CO2: 31 mmol/L (ref 22–32)
CREATININE: 1.48 mg/dL — AB (ref 0.61–1.24)
Calcium: 8.6 mg/dL — ABNORMAL LOW (ref 8.9–10.3)
GFR calc non Af Amer: 47 mL/min — ABNORMAL LOW (ref >60)
GFR, EST AFRICAN AMERICAN: 54 mL/min — AB (ref >60)
GLUCOSE: 110 mg/dL — AB (ref 65–99)
Potassium: 3.7 mmol/L (ref 3.5–5.1)
Sodium: 136 mmol/L (ref 135–145)

## 2015-04-02 LAB — DIGOXIN LEVEL: Digoxin Level: 0.8 ng/mL (ref 0.8–2.0)

## 2015-04-02 LAB — BRAIN NATRIURETIC PEPTIDE: B Natriuretic Peptide: 382.7 pg/mL — ABNORMAL HIGH (ref 0.0–100.0)

## 2015-04-02 MED ORDER — PREDNISONE 20 MG PO TABS
40.0000 mg | ORAL_TABLET | Freq: Every day | ORAL | Status: DC
  Administered 2015-04-03 – 2015-04-04 (×2): 40 mg via ORAL
  Filled 2015-04-02 (×2): qty 2

## 2015-04-02 MED ORDER — OSELTAMIVIR PHOSPHATE 75 MG PO CAPS: 75.0000 mg | ORAL_CAPSULE | Freq: Two times a day (BID) | ORAL | Status: DC

## 2015-04-02 NOTE — Progress Notes (Signed)
Subjective:  Patient denies any chest pain states breathing is slowly improving. Diuresing well  Objective:  Vital Signs in the last 24 hours: Temp:  [97.5 F (36.4 C)-97.7 F (36.5 C)] 97.6 F (36.4 C) (03/03 0854) Pulse Rate:  [69-77] 77 (03/03 0854) Resp:  [18-20] 20 (03/03 0854) BP: (83-93)/(48-62) 85/48 mmHg (03/03 0854) SpO2:  [90 %-99 %] 96 % (03/03 0854) Weight:  [109.317 kg (241 lb)] 109.317 kg (241 lb) (03/03 0531)  Intake/Output from previous day: 03/02 0701 - 03/03 0700 In: 1600 [P.O.:1440; I.V.:160] Out: 3450 [Urine:3450] Intake/Output from this shift: Total I/O In: 240 [P.O.:240] Out: 600 [Urine:600]  Physical Exam: Neck: JVD - 6 cm above sternal notch, no carotid bruit and supple, symmetrical, trachea midline Lungs: Decrease pressor at bases with faint rales air entry improved Heart: regular rate and rhythm, S1, S2 normal and Soft systolic murmur noted Abdomen: soft, non-tender; bowel sounds normal; no masses,  no organomegaly Extremities: No clubbing cyanosis 1+ edema noted  Lab Results: No results for input(s): WBC, HGB, PLT in the last 72 hours.  Recent Labs  03/31/15 0400 04/02/15 0520  NA 136 136  K 3.8 3.7  CL 93* 92*  CO2 33* 31  GLUCOSE 150* 110*  BUN 64* 63*  CREATININE 1.38* 1.48*   No results for input(s): TROPONINI in the last 72 hours.  Invalid input(s): CK, MB Hepatic Function Panel No results for input(s): PROT, ALBUMIN, AST, ALT, ALKPHOS, BILITOT, BILIDIR, IBILI in the last 72 hours. No results for input(s): CHOL in the last 72 hours. No results for input(s): PROTIME in the last 72 hours.  Imaging: Imaging results have been reviewed and No results found.  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 Continue rest of the medications Possible discharge tomorrow if stable. Increase ambulation as tolerated  LOS: 12 days    [name] 04/02/2015, 12:22 PM
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
## 2015-04-02 NOTE — Progress Notes (Signed)
Name: [name] MRN: [id number] DOB: [birth year]    ADMISSION DATE:  03/21/2015 CONSULTATION DATE:  03/23/15  REFERRING MD : Terrence Dupont  CHIEF COMPLAINT: SOB  BRIEF PATIENT DESCRIPTION: 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  PCCM was consulted on 03/23/15 for increased SOB and therefore increased demands of oxygen.   STUDIES: and EVENTS  Influenza A   positive >03/22/15  2/23 - does not feel better despite dopamine, prednisone, tamiflu and antibiotics. Easy desats. On 6L at rest. When walks need face mask 03/26/15 - feeling better but stil on high flow o2. CTA ruled out PE . 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 Also reports beeing on nivolumab since oct 2016 - can cause worsening ILD    SUBJECTIVE/OVERNIGHT/INTERVAL HX Continues to diurese well -17 L since admit Denies chest pain or dyspnea  VITAL SIGNS: Temp:  [97.5 F (36.4 C)-97.7 F (36.5 C)] 97.6 F (36.4 C) (03/03 0854) Pulse Rate:  [69-77] 77 (03/03 0854) Resp:  [18-20] 20 (03/03 0854) BP: (83-93)/(48-62) 85/48 mmHg (03/03 0854) SpO2:  [90 %-99 %] 96 % (03/03 0854) Weight:  [241 lb (109.317 kg)] 241 lb (109.317 kg) (03/03 0531)  PHYSICAL EXAMINATION: General: Morbidly obese gentleman , no distress Neuro:  Alert and oriented, answers appropriately to all questions HEENT:  Atraumatic, normocephalic, clear sclera, no discharge Cardiovascular:S1,S2 heart sound,rrr, No MRG Lungs: diminished bases, no wheezing, no crackles Abdomen: obese, non tender, bs positive.   Skin:  No rashes, ecchymosis  noted MSK - +1 pitting edema noted on B/l LE   PULMONARY No results for input(s): PHART, PCO2ART, PO2ART, HCO3, TCO2, O2SAT in the last 168 hours.  Invalid input(s): PCO2, PO2  CBC  Recent Labs Lab 03/27/15 0454  HGB 11.8*  HCT 40.6  WBC 3.0*  PLT 232    COAGULATION No results for input(s): INR in the last 168 hours.  CARDIAC   No results for input(s): TROPONINI in the last 168 hours. No results for input(s): PROBNP in the last 168 hours.   CHEMISTRY  Recent Labs Lab 03/27/15 0454 03/29/15 0549 03/31/15 0400 04/02/15 0520  NA 136 133* 136 136  K 4.3 3.7 3.8 3.7  CL 99* 93* 93* 92*  CO2 29 30 33* 31  GLUCOSE 139* 166* 150* 110*  BUN 46* 58* 64* 63*  CREATININE 1.39* 1.53* 1.38* 1.48*  CALCIUM 8.5* 8.7* 8.8* 8.6*  MG  --   --  2.0  --   PHOS  --   --  3.6  --    Estimated Creatinine Clearance: 57.4 mL/min (by C-G formula based on Cr of 1.48).   LIVER No results for input(s): AST, ALT, ALKPHOS, BILITOT, PROT, ALBUMIN, INR in the last 168 hours.   INFECTIOUS No results for input(s): LATICACIDVEN, PROCALCITON in the last 168 hours.   ENDOCRINE CBG (last 3)  No results for input(s): GLUCAP in the last 72 hours.   IMAGING x48h  - image(s) personally visualized  -   highlighted in bold No results found.  No results for input(s): PROCALCITON in the last 168 hours.   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 Chase Caller in 1 month   Rigoberto Noel. MD [phone]

## 2015-04-03 NOTE — Progress Notes (Signed)
Subjective:  Patient denies any chest pain still complains of shortness of breath with minimal exertion. Clinically appears to be improved. Diuresing well.  Objective:  Vital Signs in the last 24 hours: Temp:  [97.8 F (36.6 C)-98 F (36.7 C)] 98 F (36.7 C) (03/04 0545) Pulse Rate:  [68-72] 68 (03/04 0545) Resp:  [20] 20 (03/04 0545) BP: (92-98)/(54-56) 92/54 mmHg (03/04 0545) SpO2:  [90 %-97 %] 90 % (03/04 0757) Weight:  [104.237 kg (229 lb 12.8 oz)] 104.237 kg (229 lb 12.8 oz) (03/04 0545)  Intake/Output from previous day: 03/03 0701 - 03/04 0700 In: 51 [P.O.:960] Out: 4625 [Urine:4625] Intake/Output from this shift:    Physical Exam: Neck: no adenopathy, no carotid bruit, no JVD and supple, symmetrical, trachea midline Lungs: Decreased breath sound at bases air entry has improved Heart: regular rate and rhythm, S1, S2 normal and 2/6 systolic murmur noted Abdomen: soft, non-tender; bowel sounds normal; no masses,  no organomegaly Extremities: No clubbing cyanosis 1+ edema noted  Lab Results: No results for input(s): WBC, HGB, PLT in the last 72 hours.  Recent Labs  04/02/15 0520  NA 136  K 3.7  CL 92*  CO2 31  GLUCOSE 110*  BUN 63*  CREATININE 1.48*   No results for input(s): TROPONINI in the last 72 hours.  Invalid input(s): CK, MB Hepatic Function Panel No results for input(s): PROT, ALBUMIN, AST, ALT, ALKPHOS, BILITOT, BILIDIR, IBILI in the last 72 hours. No results for input(s): CHOL in the last 72 hours. No results for input(s): PROTIME in the last 72 hours.  Imaging: Imaging results have been reviewed and No results found.  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 Increase ambulation as tolerated  LOS: 13 days    [name] 04/03/2015, 12:20 PM

## 2015-04-04 MED ORDER — HEPARIN SOD (PORK) LOCK FLUSH 100 UNIT/ML IV SOLN
500.0000 [IU] | INTRAVENOUS | Status: AC | PRN
  Administered 2015-04-04: 500 [IU]

## 2015-04-04 MED ORDER — DIGOXIN 250 MCG PO TABS: 0.2500 mg | ORAL_TABLET | Freq: Every day | ORAL | Status: AC

## 2015-04-04 MED ORDER — PREDNISONE 20 MG PO TABS: 40.0000 mg | ORAL_TABLET | Freq: Every day | ORAL | Status: AC

## 2015-04-04 MED ORDER — TORSEMIDE 10 MG PO TABS: 10.0000 mg | ORAL_TABLET | Freq: Two times a day (BID) | ORAL | Status: AC

## 2015-04-04 NOTE — Progress Notes (Signed)
Pt discharged home IV and tele removed. Discharged instructions given verbalized understanding. No further needs at this time.

## 2015-04-04 NOTE — Discharge Summary (Signed)
Discharge summary dictated on 04/04/2015 dictation number is [phone]

## 2015-04-04 NOTE — Discharge Instructions (Signed)
Respiratory failure is when your lungs are not working well and your breathing (respiratory) system fails. When respiratory failure occurs, it is difficult for your lungs to get enough oxygen, get rid of carbon dioxide, or both. Respiratory failure can be life threatening.  Respiratory failure can be acute or chronic. Acute respiratory failure is sudden, severe, and requires emergency medical treatment. Chronic respiratory failure is less severe, happens over time, and requires ongoing treatment.  WHAT ARE THE CAUSES OF ACUTE RESPIRATORY FAILURE?  Any problem affecting the heart or lungs can cause acute respiratory failure. Some of these causes include the following:  Chronic bronchitis and emphysema (COPD).   Blood clot going to a lung (pulmonary embolism).   Having water in the lungs caused by heart failure, lung injury, or infection (pulmonary edema).   Collapsed lung (pneumothorax).   Pneumonia.   Pulmonary fibrosis.   Obesity.   Asthma.   Heart failure.   Any type of trauma to the chest that can make breathing difficult.   Nerve or muscle diseases making chest movements difficult. HOW WILL MY ACUTE RESPIRATORY FAILURE BE TREATED?  Treatment of acute respiratory failure depends on the cause of the respiratory failure. Usually, you will stay in the intensive care unit so your breathing can be watched closely. Treatment can include the following:  Oxygen. Oxygen can be delivered through the following:  Nasal cannula. This is small tubing that goes in your nose to give you oxygen.  Face mask. A face mask covers your nose and mouth to give you oxygen.  Medicine. Different medicines can be given to help with breathing. These can include:  Nebulizers. Nebulizers deliver medicines to open the air passages (bronchodilators). These medicines help to open or relax the airways in the lungs so you can breathe better. They can also help loosen mucus from your  lungs.  Diuretics. Diuretic medicines can help you breathe better by getting rid of extra water in your body.  Steroids. Steroid medicines can help decrease swelling (inflammation) in your lungs.  Antibiotics.  Chest tube. If you have a collapsed lung (pneumothorax), a chest tube is placed to help reinflate the lung.  Noninvasive positive pressure ventilation (NPPV). This is a tight-fitting mask that goes over your nose and mouth. The mask has tubing that is attached to a machine. The machine blows air into the tubing, which helps to keep the tiny air sacs (alveoli) in your lungs open. This machine allows you to breathe on your own.  Ventilator. A ventilator is a breathing machine. When on a ventilator, a breathing tube is put into the lungs. A ventilator is used when you can no longer breathe well enough on your own. You may have low oxygen levels or high carbon dioxide (CO2) levels in your blood. When you are on a ventilator, sedation and pain medicines are given to make you sleep so your lungs can heal. SEEK IMMEDIATE MEDICAL CARE IF:  You have shortness of breath (dyspnea) with or without activity.  You have rapid breathing (tachypnea).  You are wheezing.  You are unable to say more than a few words without having to catch your breath.  You find it very difficult to function normally.  You have a fast heart rate.  You have a bluish color to your finger or toe nail beds.  You have confusion or drowsiness or both.   This information is not intended to replace advice given to you by your health care provider. Make sure you discuss  any questions you have with your health care provider.   Document Released: 01/21/2013 Document Revised: 10/07/2014 Document Reviewed: 01/21/2013 Elsevier Interactive Patient Education 2016 [employer] Heart Failure Heart failure is a condition in which the heart has trouble pumping blood. This means your heart does not pump blood efficiently for  your body to work well. In some cases of heart failure, fluid may back up into your lungs or you may have swelling (edema) in your lower legs. Heart failure is usually a long-term (chronic) condition. It is important for you to take good care of yourself and follow your health care provider's treatment plan. CAUSES  Some health conditions can cause heart failure. Those health conditions include:  High blood pressure (hypertension). Hypertension causes the heart muscle to work harder than normal. When pressure in the blood vessels is high, the heart needs to pump (contract) with more force in order to circulate blood throughout the body. High blood pressure eventually causes the heart to become stiff and weak.  Coronary artery disease (CAD). CAD is the buildup of cholesterol and fat (plaque) in the arteries of the heart. The blockage in the arteries deprives the heart muscle of oxygen and blood. This can cause chest pain and may lead to a heart attack. High blood pressure can also contribute to CAD.  Heart attack (myocardial infarction). A heart attack occurs when one or more arteries in the heart become blocked. The loss of oxygen damages the muscle tissue of the heart. When this happens, part of the heart muscle dies. The injured tissue does not contract as well and weakens the heart's ability to pump blood.  Abnormal heart valves. When the heart valves do not open and close properly, it can cause heart failure. This makes the heart muscle pump harder to keep the blood flowing.  Heart muscle disease (cardiomyopathy or myocarditis). Heart muscle disease is damage to the heart muscle from a variety of causes. These can include drug or alcohol abuse, infections, or unknown reasons. These can increase the risk of heart failure.  Lung disease. Lung disease makes the heart work harder because the lungs do not work properly. This can cause a strain on the heart, leading it to fail.  Diabetes. Diabetes  increases the risk of heart failure. High blood sugar contributes to high fat (lipid) levels in the blood. Diabetes can also cause slow damage to tiny blood vessels that carry important nutrients to the heart muscle. When the heart does not get enough oxygen and food, it can cause the heart to become weak and stiff. This leads to a heart that does not contract efficiently.  Other conditions can contribute to heart failure. These include abnormal heart rhythms, thyroid problems, and low blood counts (anemia). Certain unhealthy behaviors can increase the risk of heart failure, including:  Being overweight.  Smoking or chewing tobacco.  Eating foods high in fat and cholesterol.  Abusing illicit drugs or alcohol.  Lacking physical activity. SYMPTOMS  Heart failure symptoms may vary and can be hard to detect. Symptoms may include:  Shortness of breath with activity, such as climbing stairs.  Persistent cough.  Swelling of the feet, ankles, legs, or abdomen.  Unexplained weight gain.  Difficulty breathing when lying flat (orthopnea).  Waking from sleep because of the need to sit up and get more air.  Rapid heartbeat.  Fatigue and loss of energy.  Feeling light-headed, dizzy, or close to fainting.  Loss of appetite.  Nausea.  Increased urination during the  night (nocturia). DIAGNOSIS  A diagnosis of heart failure is based on your history, symptoms, physical examination, and diagnostic tests. Diagnostic tests for heart failure may include:  Echocardiography.  Electrocardiography.  Chest X-ray.  Blood tests.  Exercise stress test.  Cardiac angiography.  Radionuclide scans. TREATMENT  Treatment is aimed at managing the symptoms of heart failure. Medicines, behavioral changes, or surgical intervention may be necessary to treat heart failure.  Medicines to help treat heart failure may include:  Angiotensin-converting enzyme (ACE) inhibitors. This type of medicine  blocks the effects of a blood protein called angiotensin-converting enzyme. ACE inhibitors relax (dilate) the blood vessels and help lower blood pressure.  Angiotensin receptor blockers (ARBs). This type of medicine blocks the actions of a blood protein called angiotensin. Angiotensin receptor blockers dilate the blood vessels and help lower blood pressure.  Water pills (diuretics). Diuretics cause the kidneys to remove salt and water from the blood. The extra fluid is removed through urination. This loss of extra fluid lowers the volume of blood the heart pumps.  Beta blockers. These prevent the heart from beating too fast and improve heart muscle strength.  Digitalis. This increases the force of the heartbeat.  Healthy behavior changes include:  Obtaining and maintaining a healthy weight.  Stopping smoking or chewing tobacco.  Eating heart-healthy foods.  Limiting or avoiding alcohol.  Stopping illicit drug use.  Physical activity as directed by your health care provider.  Surgical treatment for heart failure may include:  A procedure to open blocked arteries, repair damaged heart valves, or remove damaged heart muscle tissue.  A pacemaker to improve heart muscle function and control certain abnormal heart rhythms.  An internal cardioverter defibrillator to treat certain serious abnormal heart rhythms.  A left ventricular assist device (LVAD) to assist the pumping ability of the heart. HOME CARE INSTRUCTIONS   Take medicines only as directed by your health care provider. Medicines are important in reducing the workload of your heart, slowing the progression of heart failure, and improving your symptoms.  Do not stop taking your medicine unless directed by your health care provider.  Do not skip any dose of medicine.  Refill your prescriptions before you run out of medicine. Your medicines are needed every day.  Engage in moderate physical activity if directed by your  health care provider. Moderate physical activity can benefit some people. The elderly and people with severe heart failure should consult with a health care provider for physical activity recommendations.  Eat heart-healthy foods. Food choices should be free of trans fat and low in saturated fat, cholesterol, and salt (sodium). Healthy choices include fresh or frozen fruits and vegetables, fish, lean meats, legumes, fat-free or low-fat dairy products, and whole grain or high fiber foods. Talk to a dietitian to learn more about heart-healthy foods.  Limit sodium if directed by your health care provider. Sodium restriction may reduce symptoms of heart failure in some people. Talk to a dietitian to learn more about heart-healthy seasonings.  Use healthy cooking methods. Healthy cooking methods include roasting, grilling, broiling, baking, poaching, steaming, or stir-frying. Talk to a dietitian to learn more about healthy cooking methods.  Limit fluids if directed by your health care provider. Fluid restriction may reduce symptoms of heart failure in some people.  Weigh yourself every day. Daily weights are important in the early recognition of excess fluid. You should weigh yourself every morning after you urinate and before you eat breakfast. Wear the same amount of clothing each time you  weigh yourself. Record your daily weight. Provide your health care provider with your weight record.  Monitor and record your blood pressure if directed by your health care provider.  Check your pulse if directed by your health care provider.  Lose weight if directed by your health care provider. Weight loss may reduce symptoms of heart failure in some people.  Stop smoking or chewing tobacco. Nicotine makes your heart work harder by causing your blood vessels to constrict. Do not use nicotine gum or patches before talking to your health care provider.  Keep all follow-up visits as directed by your health care  provider. This is important.  Limit alcohol intake to no more than 1 drink per day for nonpregnant women and 2 drinks per day for men. One drink equals 12 ounces of beer, 5 ounces of wine, or 1 ounces of hard liquor. Drinking more than that is harmful to your heart. Tell your health care provider if you drink alcohol several times a week. Talk with your health care provider about whether alcohol is safe for you. If your heart has already been damaged by alcohol or you have severe heart failure, drinking alcohol should be stopped completely.  Stop illicit drug use.  Stay up-to-date with immunizations. It is especially important to prevent respiratory infections through current pneumococcal and influenza immunizations.  Manage other health conditions such as hypertension, diabetes, thyroid disease, or abnormal heart rhythms as directed by your health care provider.  Learn to manage stress.  Plan rest periods when fatigued.  Learn strategies to manage high temperatures. If the weather is extremely hot:  Avoid vigorous physical activity.  Use air conditioning or fans or seek a cooler location.  Avoid caffeine and alcohol.  Wear loose-fitting, lightweight, and light-colored clothing.  Learn strategies to manage cold temperatures. If the weather is extremely cold:  Avoid vigorous physical activity.  Layer clothes.  Wear mittens or gloves, a hat, and a scarf when going outside.  Avoid alcohol.  Obtain ongoing education and support as needed.  Participate in or seek rehabilitation as needed to maintain or improve independence and quality of life. SEEK MEDICAL CARE IF:   You have a rapid weight gain.  You have increasing shortness of breath that is unusual for you.  You are unable to participate in your usual physical activities.  You tire easily.  You cough more than normal, especially with physical activity.  You have any or more swelling in areas such as your hands, feet,  ankles, or abdomen.  You are unable to sleep because it is hard to breathe.  You feel like your heart is beating fast (palpitations).  You become dizzy or light-headed upon standing up. SEEK IMMEDIATE MEDICAL CARE IF:   You have difficulty breathing.  There is a change in mental status such as decreased alertness or difficulty with concentration.  You have a pain or discomfort in your chest.  You have an episode of fainting (syncope). MAKE SURE YOU:   Understand these instructions.  Will watch your condition.  Will get help right away if you are not doing well or get worse.   This information is not intended to replace advice given to you by your health care provider. Make sure you discuss any questions you have with your health care provider.   Document Released: 01/16/2005 Document Revised: 06/02/2014 Document Reviewed: 02/16/2012 Elsevier Interactive Patient Education [employer].
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
## 2015-04-06 ENCOUNTER — Other Ambulatory Visit: Payer: Self-pay

## 2015-04-06 NOTE — Patient Outreach (Signed)
Initial transition of care completed. Patient identiified himself using HIPPA identiifers by providing date of birth and address.  Patient stated his goal is to stay out of the hospital. States his oxygen has been increased to 5 liters per minute via nasal cannula.  Paitent has appointment with cardiologist on Thursday, primary care next week  Plan: Home visit next week, call on Monday to schedule home visit.

## 2015-04-07 ENCOUNTER — Encounter: Payer: Self-pay | Admitting: *Deleted

## 2015-04-07 ENCOUNTER — Other Ambulatory Visit: Payer: Self-pay | Admitting: Internal Medicine

## 2015-04-07 DIAGNOSIS — J439 Emphysema, unspecified: Secondary | ICD-10-CM

## 2015-04-07 NOTE — Patient Outreach (Signed)
[facility]) Care Management  04/07/2015  Marrio Scribner [facility] [birth year] [id number]   Patient triggered RED on EMMI COPD dashboard, notification sent to Erenest Rasher, RN.  Thanks, Ronnell Freshwater. [place], Center Assistant Phone: [phone] Fax: [phone]

## 2015-04-08 ENCOUNTER — Other Ambulatory Visit: Payer: Self-pay

## 2015-04-08 NOTE — Patient Outreach (Signed)
Received message from Lurline Del, CM Assistant advising this RNCM that patent's status had shown up as RED on EMMI Dashboard.  Call made to patient to follow up. Patient stated he was not in any distress, denies any increase shortness of breath. Patient advised of his COPD status showing up as RED. Patient advised to call 911 if respiratory distress occurs. Patient states he feels just fine.  Plan: Home visit next week for community care coordination

## 2015-04-14 NOTE — Patient Outreach (Signed)
[facility]) Care Management  04/14/2015  [name] [facility] [birth year] [id number]   Patient triggered RED on EMMI COPD dashboard, notification sent to Erenest Rasher, RN.  Thanks, [name]. [place], [place] Assistant Phone: [phone] Fax: [phone]

## 2015-04-15 ENCOUNTER — Other Ambulatory Visit (HOSPITAL_BASED_OUTPATIENT_CLINIC_OR_DEPARTMENT_OTHER): Payer: Commercial Managed Care - HMO

## 2015-04-15 ENCOUNTER — Other Ambulatory Visit: Payer: Commercial Managed Care - HMO

## 2015-04-15 ENCOUNTER — Ambulatory Visit (HOSPITAL_BASED_OUTPATIENT_CLINIC_OR_DEPARTMENT_OTHER): Payer: Commercial Managed Care - HMO | Admitting: Internal Medicine

## 2015-04-15 ENCOUNTER — Encounter: Payer: Self-pay | Admitting: Internal Medicine

## 2015-04-15 ENCOUNTER — Telehealth: Payer: Self-pay | Admitting: Medical Oncology

## 2015-04-15 ENCOUNTER — Telehealth: Payer: Self-pay | Admitting: Internal Medicine

## 2015-04-15 ENCOUNTER — Ambulatory Visit (HOSPITAL_BASED_OUTPATIENT_CLINIC_OR_DEPARTMENT_OTHER): Payer: Commercial Managed Care - HMO

## 2015-04-15 ENCOUNTER — Other Ambulatory Visit: Payer: Self-pay | Admitting: Medical Oncology

## 2015-04-15 ENCOUNTER — Ambulatory Visit: Payer: Commercial Managed Care - HMO

## 2015-04-15 VITALS — BP 90/52 | HR 96 | Temp 97.8°F | Resp 17 | Ht 69.0 in | Wt 244.2 lb

## 2015-04-15 DIAGNOSIS — I5030 Unspecified diastolic (congestive) heart failure: Secondary | ICD-10-CM

## 2015-04-15 DIAGNOSIS — J439 Emphysema, unspecified: Secondary | ICD-10-CM

## 2015-04-15 DIAGNOSIS — C3432 Malignant neoplasm of lower lobe, left bronchus or lung: Secondary | ICD-10-CM

## 2015-04-15 DIAGNOSIS — C7951 Secondary malignant neoplasm of bone: Secondary | ICD-10-CM

## 2015-04-15 DIAGNOSIS — Z79899 Other long term (current) drug therapy: Secondary | ICD-10-CM | POA: Diagnosis not present

## 2015-04-15 DIAGNOSIS — C3410 Malignant neoplasm of upper lobe, unspecified bronchus or lung: Secondary | ICD-10-CM

## 2015-04-15 DIAGNOSIS — Z66 Do not resuscitate: Secondary | ICD-10-CM

## 2015-04-15 DIAGNOSIS — C3411 Malignant neoplasm of upper lobe, right bronchus or lung: Secondary | ICD-10-CM

## 2015-04-15 DIAGNOSIS — J441 Chronic obstructive pulmonary disease with (acute) exacerbation: Secondary | ICD-10-CM

## 2015-04-15 DIAGNOSIS — I509 Heart failure, unspecified: Secondary | ICD-10-CM | POA: Diagnosis not present

## 2015-04-15 DIAGNOSIS — I5031 Acute diastolic (congestive) heart failure: Secondary | ICD-10-CM

## 2015-04-15 DIAGNOSIS — I38 Endocarditis, valve unspecified: Secondary | ICD-10-CM

## 2015-04-15 DIAGNOSIS — J449 Chronic obstructive pulmonary disease, unspecified: Secondary | ICD-10-CM

## 2015-04-15 LAB — CBC WITH DIFFERENTIAL/PLATELET
BASO%: 0 % (ref 0.0–2.0)
Basophils Absolute: 0 10*3/uL (ref 0.0–0.1)
EOS%: 0 % (ref 0.0–7.0)
Eosinophils Absolute: 0 10*3/uL (ref 0.0–0.5)
HEMATOCRIT: 39.6 % (ref 38.4–49.9)
HEMOGLOBIN: 12.1 g/dL — AB (ref 13.0–17.1)
LYMPH#: 0.5 10*3/uL — AB (ref 0.9–3.3)
LYMPH%: 4.5 % — ABNORMAL LOW (ref 14.0–49.0)
MCH: 27.6 pg (ref 27.2–33.4)
MCHC: 30.6 g/dL — ABNORMAL LOW (ref 32.0–36.0)
MCV: 90.4 fL (ref 79.3–98.0)
MONO#: 0.2 10*3/uL (ref 0.1–0.9)
MONO%: 1.8 % (ref 0.0–14.0)
NEUT%: 93.7 % — ABNORMAL HIGH (ref 39.0–75.0)
NEUTROS ABS: 9.7 10*3/uL — AB (ref 1.5–6.5)
Platelets: 178 10*3/uL (ref 140–400)
RBC: 4.38 10*6/uL (ref 4.20–5.82)
RDW: 20.8 % — ABNORMAL HIGH (ref 11.0–14.6)
WBC: 10.3 10*3/uL (ref 4.0–10.3)

## 2015-04-15 LAB — COMPREHENSIVE METABOLIC PANEL
ALBUMIN: 3 g/dL — AB (ref 3.5–5.0)
ALK PHOS: 61 U/L (ref 40–150)
ALT: 19 U/L (ref 0–55)
AST: 16 U/L (ref 5–34)
Anion Gap: 13 mEq/L — ABNORMAL HIGH (ref 3–11)
BUN: 84.7 mg/dL — AB (ref 7.0–26.0)
CALCIUM: 8.9 mg/dL (ref 8.4–10.4)
CHLORIDE: 100 meq/L (ref 98–109)
CO2: 27 mEq/L (ref 22–29)
CREATININE: 2 mg/dL — AB (ref 0.7–1.3)
EGFR: 39 mL/min/{1.73_m2} — ABNORMAL LOW (ref >90)
Glucose: 145 mg/dl — ABNORMAL HIGH (ref 70–140)
Potassium: 3.5 mEq/L (ref 3.5–5.1)
Sodium: 140 mEq/L (ref 136–145)
Total Bilirubin: 1.48 mg/dL — ABNORMAL HIGH (ref 0.20–1.20)
Total Protein: 6.3 g/dL — ABNORMAL LOW (ref 6.4–8.3)

## 2015-04-15 LAB — TSH: TSH: 2.179 m[IU]/L (ref 0.320–4.118)

## 2015-04-15 MED ORDER — SODIUM CHLORIDE 0.9% FLUSH
10.0000 mL | INTRAVENOUS | Status: DC | PRN
  Administered 2015-04-15: 10 mL via INTRAVENOUS
  Filled 2015-04-15: qty 10

## 2015-04-15 MED ORDER — HEPARIN SOD (PORK) LOCK FLUSH 100 UNIT/ML IV SOLN
500.0000 [IU] | Freq: Once | INTRAVENOUS | Status: AC
  Administered 2015-04-15: 500 [IU] via INTRAVENOUS
  Filled 2015-04-15: qty 5

## 2015-04-15 NOTE — Telephone Encounter (Signed)
per piofto sch pt appt-adv pt [facility] will call to sch scan-gave contrast
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
## 2015-04-15 NOTE — Telephone Encounter (Signed)
Pt under palliative care service dept at [facility] for chronic lung disease.

## 2015-04-19 ENCOUNTER — Other Ambulatory Visit: Payer: Self-pay

## 2015-04-19 ENCOUNTER — Emergency Department (HOSPITAL_COMMUNITY): Payer: Commercial Managed Care - HMO

## 2015-04-19 ENCOUNTER — Inpatient Hospital Stay (HOSPITAL_COMMUNITY)
Admission: EM | Admit: 2015-04-19 | Discharge: 2015-05-31 | DRG: 189 | Disposition: E | Payer: Commercial Managed Care - HMO | Attending: Cardiology | Admitting: Cardiology

## 2015-04-19 ENCOUNTER — Encounter (HOSPITAL_COMMUNITY): Payer: Self-pay | Admitting: Emergency Medicine

## 2015-04-19 DIAGNOSIS — I130 Hypertensive heart and chronic kidney disease with heart failure and stage 1 through stage 4 chronic kidney disease, or unspecified chronic kidney disease: Secondary | ICD-10-CM | POA: Diagnosis present

## 2015-04-19 DIAGNOSIS — I2781 Cor pulmonale (chronic): Secondary | ICD-10-CM | POA: Diagnosis present

## 2015-04-19 DIAGNOSIS — Z923 Personal history of irradiation: Secondary | ICD-10-CM | POA: Diagnosis not present

## 2015-04-19 DIAGNOSIS — I071 Rheumatic tricuspid insufficiency: Secondary | ICD-10-CM | POA: Diagnosis present

## 2015-04-19 DIAGNOSIS — I872 Venous insufficiency (chronic) (peripheral): Secondary | ICD-10-CM | POA: Diagnosis present

## 2015-04-19 DIAGNOSIS — J441 Chronic obstructive pulmonary disease with (acute) exacerbation: Secondary | ICD-10-CM | POA: Diagnosis present

## 2015-04-19 DIAGNOSIS — K5901 Slow transit constipation: Secondary | ICD-10-CM | POA: Diagnosis not present

## 2015-04-19 DIAGNOSIS — Z801 Family history of malignant neoplasm of trachea, bronchus and lung: Secondary | ICD-10-CM | POA: Diagnosis not present

## 2015-04-19 DIAGNOSIS — Z66 Do not resuscitate: Secondary | ICD-10-CM | POA: Diagnosis not present

## 2015-04-19 DIAGNOSIS — J440 Chronic obstructive pulmonary disease with acute lower respiratory infection: Secondary | ICD-10-CM | POA: Diagnosis present

## 2015-04-19 DIAGNOSIS — C3490 Malignant neoplasm of unspecified part of unspecified bronchus or lung: Secondary | ICD-10-CM | POA: Diagnosis present

## 2015-04-19 DIAGNOSIS — I509 Heart failure, unspecified: Secondary | ICD-10-CM

## 2015-04-19 DIAGNOSIS — R0600 Dyspnea, unspecified: Secondary | ICD-10-CM

## 2015-04-19 DIAGNOSIS — Z515 Encounter for palliative care: Secondary | ICD-10-CM | POA: Diagnosis not present

## 2015-04-19 DIAGNOSIS — I248 Other forms of acute ischemic heart disease: Secondary | ICD-10-CM | POA: Diagnosis present

## 2015-04-19 DIAGNOSIS — F411 Generalized anxiety disorder: Secondary | ICD-10-CM | POA: Insufficient documentation

## 2015-04-19 DIAGNOSIS — Z7952 Long term (current) use of systemic steroids: Secondary | ICD-10-CM

## 2015-04-19 DIAGNOSIS — I2510 Atherosclerotic heart disease of native coronary artery without angina pectoris: Secondary | ICD-10-CM | POA: Diagnosis present

## 2015-04-19 DIAGNOSIS — Z79899 Other long term (current) drug therapy: Secondary | ICD-10-CM

## 2015-04-19 DIAGNOSIS — Z885 Allergy status to narcotic agent status: Secondary | ICD-10-CM | POA: Diagnosis not present

## 2015-04-19 DIAGNOSIS — I272 Other secondary pulmonary hypertension: Secondary | ICD-10-CM | POA: Diagnosis present

## 2015-04-19 DIAGNOSIS — Z87891 Personal history of nicotine dependence: Secondary | ICD-10-CM

## 2015-04-19 DIAGNOSIS — E876 Hypokalemia: Secondary | ICD-10-CM | POA: Diagnosis present

## 2015-04-19 DIAGNOSIS — T460X5A Adverse effect of cardiac-stimulant glycosides and drugs of similar action, initial encounter: Secondary | ICD-10-CM | POA: Diagnosis present

## 2015-04-19 DIAGNOSIS — I5043 Acute on chronic combined systolic (congestive) and diastolic (congestive) heart failure: Secondary | ICD-10-CM | POA: Diagnosis present

## 2015-04-19 DIAGNOSIS — Z9221 Personal history of antineoplastic chemotherapy: Secondary | ICD-10-CM | POA: Diagnosis not present

## 2015-04-19 DIAGNOSIS — J8410 Pulmonary fibrosis, unspecified: Secondary | ICD-10-CM | POA: Diagnosis present

## 2015-04-19 DIAGNOSIS — R0602 Shortness of breath: Secondary | ICD-10-CM | POA: Diagnosis present

## 2015-04-19 DIAGNOSIS — Z902 Acquired absence of lung [part of]: Secondary | ICD-10-CM | POA: Diagnosis not present

## 2015-04-19 DIAGNOSIS — G4733 Obstructive sleep apnea (adult) (pediatric): Secondary | ICD-10-CM | POA: Diagnosis present

## 2015-04-19 DIAGNOSIS — J810 Acute pulmonary edema: Secondary | ICD-10-CM | POA: Diagnosis not present

## 2015-04-19 DIAGNOSIS — Z9981 Dependence on supplemental oxygen: Secondary | ICD-10-CM | POA: Diagnosis not present

## 2015-04-19 DIAGNOSIS — I5033 Acute on chronic diastolic (congestive) heart failure: Secondary | ICD-10-CM | POA: Diagnosis not present

## 2015-04-19 DIAGNOSIS — Z8249 Family history of ischemic heart disease and other diseases of the circulatory system: Secondary | ICD-10-CM | POA: Diagnosis not present

## 2015-04-19 DIAGNOSIS — J189 Pneumonia, unspecified organism: Secondary | ICD-10-CM | POA: Diagnosis present

## 2015-04-19 DIAGNOSIS — J9621 Acute and chronic respiratory failure with hypoxia: Principal | ICD-10-CM | POA: Diagnosis present

## 2015-04-19 DIAGNOSIS — E785 Hyperlipidemia, unspecified: Secondary | ICD-10-CM | POA: Diagnosis present

## 2015-04-19 DIAGNOSIS — Z9225 Personal history of immunosupression therapy: Secondary | ICD-10-CM | POA: Diagnosis not present

## 2015-04-19 DIAGNOSIS — G4700 Insomnia, unspecified: Secondary | ICD-10-CM | POA: Diagnosis present

## 2015-04-19 DIAGNOSIS — Z6837 Body mass index (BMI) 37.0-37.9, adult: Secondary | ICD-10-CM

## 2015-04-19 DIAGNOSIS — R079 Chest pain, unspecified: Secondary | ICD-10-CM

## 2015-04-19 DIAGNOSIS — N184 Chronic kidney disease, stage 4 (severe): Secondary | ICD-10-CM | POA: Diagnosis present

## 2015-04-19 DIAGNOSIS — I959 Hypotension, unspecified: Secondary | ICD-10-CM | POA: Diagnosis not present

## 2015-04-19 DIAGNOSIS — L8990 Pressure ulcer of unspecified site, unspecified stage: Secondary | ICD-10-CM | POA: Insufficient documentation

## 2015-04-19 DIAGNOSIS — C7951 Secondary malignant neoplasm of bone: Secondary | ICD-10-CM | POA: Diagnosis present

## 2015-04-19 DIAGNOSIS — J9690 Respiratory failure, unspecified, unspecified whether with hypoxia or hypercapnia: Secondary | ICD-10-CM

## 2015-04-19 LAB — CBC WITH DIFFERENTIAL/PLATELET
BASOS ABS: 0 10*3/uL (ref 0.0–0.1)
Basophils Relative: 0 %
EOS ABS: 0 10*3/uL (ref 0.0–0.7)
Eosinophils Relative: 0 %
HCT: 40.5 % (ref 39.0–52.0)
HEMOGLOBIN: 12.2 g/dL — AB (ref 13.0–17.0)
LYMPHS PCT: 2 %
Lymphs Abs: 0.2 10*3/uL — ABNORMAL LOW (ref 0.7–4.0)
MCH: 26.8 pg (ref 26.0–34.0)
MCHC: 30.1 g/dL (ref 30.0–36.0)
MCV: 89 fL (ref 78.0–100.0)
MONO ABS: 0.3 10*3/uL (ref 0.1–1.0)
Monocytes Relative: 3 %
NEUTROS PCT: 95 %
Neutro Abs: 10.2 10*3/uL — ABNORMAL HIGH (ref 1.7–7.7)
PLATELETS: 231 10*3/uL (ref 150–400)
RBC: 4.55 MIL/uL (ref 4.22–5.81)
RDW: 21.3 % — ABNORMAL HIGH (ref 11.5–15.5)
WBC: 10.7 10*3/uL — AB (ref 4.0–10.5)

## 2015-04-19 LAB — I-STAT CHEM 8, ED
BUN: 90 mg/dL — AB (ref 6–20)
Calcium, Ion: 1.04 mmol/L — ABNORMAL LOW (ref 1.13–1.30)
Chloride: 94 mmol/L — ABNORMAL LOW (ref 101–111)
Creatinine, Ser: 2.1 mg/dL — ABNORMAL HIGH (ref 0.61–1.24)
Glucose, Bld: 156 mg/dL — ABNORMAL HIGH (ref 65–99)
HEMATOCRIT: 44 % (ref 39.0–52.0)
HEMOGLOBIN: 15 g/dL (ref 13.0–17.0)
Potassium: 4 mmol/L (ref 3.5–5.1)
SODIUM: 133 mmol/L — AB (ref 135–145)
TCO2: 27 mmol/L (ref 0–100)

## 2015-04-19 LAB — BASIC METABOLIC PANEL
Anion gap: 16 — ABNORMAL HIGH (ref 5–15)
BUN: 95 mg/dL — ABNORMAL HIGH (ref 6–20)
CALCIUM: 8.9 mg/dL (ref 8.9–10.3)
CO2: 25 mmol/L (ref 22–32)
CREATININE: 2.03 mg/dL — AB (ref 0.61–1.24)
Chloride: 95 mmol/L — ABNORMAL LOW (ref 101–111)
GFR calc non Af Amer: 32 mL/min — ABNORMAL LOW (ref >60)
GFR, EST AFRICAN AMERICAN: 37 mL/min — AB (ref >60)
Glucose, Bld: 169 mg/dL — ABNORMAL HIGH (ref 65–99)
Potassium: 4.2 mmol/L (ref 3.5–5.1)
Sodium: 136 mmol/L (ref 135–145)

## 2015-04-19 LAB — I-STAT ARTERIAL BLOOD GAS, ED
ACID-BASE EXCESS: 5 mmol/L — AB (ref 0.0–2.0)
BICARBONATE: 28 meq/L — AB (ref 20.0–24.0)
O2 Saturation: 96 %
PO2 ART: 70 mmHg — AB (ref 80.0–100.0)
Patient temperature: 97.6
TCO2: 29 mmol/L (ref 0–100)
pCO2 arterial: 35.6 mmHg (ref 35.0–45.0)
pH, Arterial: 7.502 — ABNORMAL HIGH (ref 7.350–7.450)

## 2015-04-19 LAB — I-STAT TROPONIN, ED: Troponin i, poc: 0.15 ng/mL (ref 0.00–0.08)

## 2015-04-19 LAB — BRAIN NATRIURETIC PEPTIDE: B Natriuretic Peptide: 1037.8 pg/mL — ABNORMAL HIGH (ref 0.0–100.0)

## 2015-04-19 MED ORDER — SODIUM CHLORIDE 0.9% FLUSH: 3.0000 mL | INTRAVENOUS | Status: DC | PRN

## 2015-04-19 MED ORDER — SODIUM CHLORIDE 0.9% FLUSH
3.0000 mL | Freq: Two times a day (BID) | INTRAVENOUS | Status: DC
  Administered 2015-04-19 – 2015-04-29 (×14): 3 mL via INTRAVENOUS
  Administered 2015-04-29: 10 mL via INTRAVENOUS
  Administered 2015-04-30 – 2015-05-04 (×8): 3 mL via INTRAVENOUS
  Administered 2015-05-05: 10 mL via INTRAVENOUS
  Administered 2015-05-05 (×2): 3 mL via INTRAVENOUS
  Administered 2015-05-06: 10 mL via INTRAVENOUS
  Administered 2015-05-06: 3 mL via INTRAVENOUS

## 2015-04-19 MED ORDER — SODIUM CHLORIDE 0.9 % IV SOLN: 250.0000 mL | INTRAVENOUS | Status: DC | PRN

## 2015-04-19 MED ORDER — LORAZEPAM 2 MG/ML IJ SOLN
0.5000 mg | Freq: Once | INTRAMUSCULAR | Status: AC
  Administered 2015-04-19: 0.5 mg via INTRAVENOUS
  Filled 2015-04-19: qty 1

## 2015-04-19 MED ORDER — DIGOXIN 250 MCG PO TABS
0.2500 mg | ORAL_TABLET | Freq: Every day | ORAL | Status: DC
  Administered 2015-04-19: 0.25 mg via ORAL
  Filled 2015-04-19: qty 2

## 2015-04-19 MED ORDER — ALLOPURINOL 100 MG PO TABS: 100.0000 mg | ORAL_TABLET | Freq: Every day | ORAL | Status: DC | PRN

## 2015-04-19 MED ORDER — METOLAZONE 10 MG PO TABS
5.0000 mg | ORAL_TABLET | Freq: Every day | ORAL | Status: DC
  Administered 2015-04-20 – 2015-05-01 (×11): 5 mg via ORAL
  Filled 2015-04-19 (×12): qty 1

## 2015-04-19 MED ORDER — FUROSEMIDE 10 MG/ML IJ SOLN
80.0000 mg | Freq: Once | INTRAMUSCULAR | Status: AC
  Administered 2015-04-19: 80 mg via INTRAVENOUS
  Filled 2015-04-19: qty 8

## 2015-04-19 MED ORDER — METHYLPREDNISOLONE SODIUM SUCC 125 MG IJ SOLR
60.0000 mg | Freq: Four times a day (QID) | INTRAMUSCULAR | Status: DC
Start: 2015-04-19 — End: 2015-04-23
  Administered 2015-04-19 – 2015-04-23 (×14): 60 mg via INTRAVENOUS
  Filled 2015-04-19 (×14): qty 2

## 2015-04-19 MED ORDER — ACETAMINOPHEN 325 MG PO TABS
650.0000 mg | ORAL_TABLET | ORAL | Status: DC | PRN
  Administered 2015-04-25 – 2015-05-05 (×2): 650 mg via ORAL
  Filled 2015-04-19 (×2): qty 2

## 2015-04-19 MED ORDER — POTASSIUM CHLORIDE CRYS ER 20 MEQ PO TBCR
20.0000 meq | EXTENDED_RELEASE_TABLET | Freq: Every day | ORAL | Status: DC
  Administered 2015-04-19 – 2015-05-01 (×13): 20 meq via ORAL
  Filled 2015-04-19 (×13): qty 1

## 2015-04-19 MED ORDER — HEPARIN SODIUM (PORCINE) 5000 UNIT/ML IJ SOLN
5000.0000 [IU] | Freq: Three times a day (TID) | INTRAMUSCULAR | Status: DC
  Administered 2015-04-19 – 2015-05-06 (×51): 5000 [IU] via SUBCUTANEOUS
  Filled 2015-04-19 (×51): qty 1

## 2015-04-19 MED ORDER — DOPAMINE-DEXTROSE 3.2-5 MG/ML-% IV SOLN
2.5000 ug/kg/min | INTRAVENOUS | Status: DC
  Administered 2015-04-19: 2.5 ug/kg/min via INTRAVENOUS
  Filled 2015-04-19: qty 250

## 2015-04-19 MED ORDER — ONDANSETRON HCL 4 MG/2ML IJ SOLN: 4.0000 mg | Freq: Four times a day (QID) | INTRAMUSCULAR | Status: DC | PRN

## 2015-04-19 MED ORDER — FUROSEMIDE 10 MG/ML IJ SOLN
40.0000 mg | Freq: Two times a day (BID) | INTRAMUSCULAR | Status: DC
  Administered 2015-04-20 – 2015-04-25 (×10): 40 mg via INTRAVENOUS
  Filled 2015-04-19 (×12): qty 4

## 2015-04-19 MED ORDER — LEVALBUTEROL HCL 1.25 MG/0.5ML IN NEBU
1.2500 mg | INHALATION_SOLUTION | Freq: Three times a day (TID) | RESPIRATORY_TRACT | Status: DC
  Administered 2015-04-19 – 2015-04-20 (×4): 1.25 mg via RESPIRATORY_TRACT
  Filled 2015-04-19 (×4): qty 0.5

## 2015-04-19 NOTE — ED Notes (Signed)
Spoke with MD regarding patient hypotension of 88/59 prior to giving 80 mg of lasix. No recommendations at this time, continue with 80 mg IV lasix.

## 2015-04-19 NOTE — ED Notes (Signed)
Ordered diet tray 
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
## 2015-04-19 NOTE — Progress Notes (Signed)
Pt taken off Bipap and placed on [place] 5 Lpm. Sat 95%.

## 2015-04-19 NOTE — ED Notes (Signed)
Pt stood up with family at bedside to use the urinal. Pt desturated into the 80s. Pt placed back in bed. O2 sats did not come back up on its own. Non rebreather placed on for approx 30 secs and pt O2 sats raised to 99%

## 2015-04-19 NOTE — ED Notes (Signed)
Pt here for increased SOB; pt on home O2; pt sts recently admitted for similar when had the flu; pt SOB and labored at present

## 2015-04-19 NOTE — Patient Outreach (Signed)
This RNCM made call to patient for assessment of community care coordination needs. Patient's son answered telephone, provided date of birth and patient's address to satisfy HIPPA. Son has been consented to speak for patient [name]). [name] stated patient's breathing had become irregular, stated he was taking patient to emergency room via private vehicle.  [name] refused assistance of this RNCM in activating emergency system by calling 911 to have patient transferred. [name] stated he wanted to take his father.  [name] stated patient was alert, did not want to be transported by 911.  Call made to Natividad Brood to update on patient's status and to advise that [facility] has done a consult and had anticipated patient was at end of life.  Plan: Will attempt to make contact with patient on tomorrow to schedule home visit.
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
## 2015-04-20 ENCOUNTER — Encounter (HOSPITAL_COMMUNITY): Payer: Self-pay | Admitting: *Deleted

## 2015-04-20 ENCOUNTER — Other Ambulatory Visit: Payer: Self-pay

## 2015-04-20 LAB — CBC WITH DIFFERENTIAL/PLATELET
BASOS ABS: 0 10*3/uL (ref 0.0–0.1)
Basophils Relative: 0 %
EOS ABS: 0 10*3/uL (ref 0.0–0.7)
Eosinophils Relative: 0 %
HEMATOCRIT: 37.6 % — AB (ref 39.0–52.0)
HEMOGLOBIN: 11.7 g/dL — AB (ref 13.0–17.0)
LYMPHS PCT: 5 %
Lymphs Abs: 0.2 10*3/uL — ABNORMAL LOW (ref 0.7–4.0)
MCH: 27.8 pg (ref 26.0–34.0)
MCHC: 31.1 g/dL (ref 30.0–36.0)
MCV: 89.3 fL (ref 78.0–100.0)
MONOS PCT: 3 %
Monocytes Absolute: 0.1 10*3/uL (ref 0.1–1.0)
NEUTROS ABS: 4.4 10*3/uL (ref 1.7–7.7)
NEUTROS PCT: 92 %
Platelets: 243 10*3/uL (ref 150–400)
RBC: 4.21 MIL/uL — AB (ref 4.22–5.81)
RDW: 21.1 % — ABNORMAL HIGH (ref 11.5–15.5)
WBC: 4.7 10*3/uL (ref 4.0–10.5)

## 2015-04-20 LAB — DIGOXIN LEVEL: DIGOXIN LVL: 2.4 ng/mL — AB (ref 0.8–2.0)

## 2015-04-20 LAB — BASIC METABOLIC PANEL
Anion gap: 14 (ref 5–15)
BUN: 101 mg/dL — ABNORMAL HIGH (ref 6–20)
CO2: 28 mmol/L (ref 22–32)
Calcium: 8.9 mg/dL (ref 8.9–10.3)
Chloride: 93 mmol/L — ABNORMAL LOW (ref 101–111)
Creatinine, Ser: 1.93 mg/dL — ABNORMAL HIGH (ref 0.61–1.24)
GFR calc Af Amer: 39 mL/min — ABNORMAL LOW (ref >60)
GFR, EST NON AFRICAN AMERICAN: 34 mL/min — AB (ref >60)
GLUCOSE: 167 mg/dL — AB (ref 65–99)
POTASSIUM: 4.2 mmol/L (ref 3.5–5.1)
SODIUM: 135 mmol/L (ref 135–145)

## 2015-04-20 LAB — MRSA PCR SCREENING: MRSA BY PCR: NEGATIVE

## 2015-04-20 MED ORDER — LEVALBUTEROL HCL 1.25 MG/0.5ML IN NEBU
1.2500 mg | INHALATION_SOLUTION | Freq: Four times a day (QID) | RESPIRATORY_TRACT | Status: DC
  Administered 2015-04-21 – 2015-05-02 (×48): 1.25 mg via RESPIRATORY_TRACT
  Filled 2015-04-20 (×49): qty 0.5

## 2015-04-20 MED ORDER — CETYLPYRIDINIUM CHLORIDE 0.05 % MT LIQD
7.0000 mL | Freq: Two times a day (BID) | OROMUCOSAL | Status: DC
  Administered 2015-04-21 – 2015-05-06 (×28): 7 mL via OROMUCOSAL

## 2015-04-20 MED ORDER — DOPAMINE-DEXTROSE 3.2-5 MG/ML-% IV SOLN
2.5000 ug/kg/min | INTRAVENOUS | Status: DC
  Administered 2015-04-20 – 2015-04-30 (×6): 2.5 ug/kg/min via INTRAVENOUS
  Filled 2015-04-20 (×6): qty 250

## 2015-04-20 NOTE — Consult Note (Signed)
   [facility] Inpatient Consult   04/20/2015  [name] [facility] Jun 17, 1945 [id number] Received information from [facility] community nurse.  Patient is currently active with [facility] for chronic disease management services.  Patient has been engaged by a [employer]. Our community based plan of care has focused on disease management and community resource support.  Will follow up with Inpatient Case Manager to make aware that [facility] following. Of note, [facility] Care Management services does not replace or interfere with any services that are needed or arranged by inpatient case management or social work.  For additional questions or referrals please contact: Natividad Brood, RN BSN [facility] Liaison  [phone] business mobile phone Toll free office [phone]

## 2015-04-20 NOTE — Progress Notes (Signed)
Pt having frequent desats into the 37s on 6L [place]. Unable to get sats above 86% on 55% venti mask. Pt refusing to wear BIPAP again, states he cannot tolerate it. Pt resting comfortably on 100% NRB.

## 2015-04-20 NOTE — Progress Notes (Signed)
Utilization Review Completed.Donne Anon T3/21/2017

## 2015-04-20 NOTE — Patient Outreach (Signed)
Per EPIC, patient remains hospitalized at [facility].  Plan: Follow clinical updates by communicating with [facility] for disposition.

## 2015-04-20 NOTE — Progress Notes (Signed)
RT took patient off NRB to venti mask. Patient did not won't to wear the venti mask he wanted to be on a [place] so he could eat. RT will continue to monitor.
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
## 2015-04-21 LAB — BASIC METABOLIC PANEL
Anion gap: 8 (ref 5–15)
BUN: 101 mg/dL — AB (ref 6–20)
CALCIUM: 8.8 mg/dL — AB (ref 8.9–10.3)
CO2: 30 mmol/L (ref 22–32)
CREATININE: 1.79 mg/dL — AB (ref 0.61–1.24)
Chloride: 94 mmol/L — ABNORMAL LOW (ref 101–111)
GFR calc Af Amer: 43 mL/min — ABNORMAL LOW (ref >60)
GFR calc non Af Amer: 37 mL/min — ABNORMAL LOW (ref >60)
GLUCOSE: 209 mg/dL — AB (ref 65–99)
Potassium: 3.8 mmol/L (ref 3.5–5.1)
Sodium: 132 mmol/L — ABNORMAL LOW (ref 135–145)

## 2015-04-21 LAB — DIGOXIN LEVEL: Digoxin Level: 2 ng/mL (ref 0.8–2.0)

## 2015-04-21 LAB — BRAIN NATRIURETIC PEPTIDE: B Natriuretic Peptide: 951 pg/mL — ABNORMAL HIGH (ref 0.0–100.0)

## 2015-04-21 NOTE — Consult Note (Addendum)
   [facility] Inpatient Consult   04/21/2015  Amal Saiki [facility] 1945/05/13 [id number] 4:03 pm - Call received from [place]. [phone] with [facility].  She states that have been following the patient prior to admission and was working on getting the patient some high flow oxygen set up for home and hoping it could be set up prior to discharge from the hospital.  Will follow up with inpatient RNCM regarding this request.  For questions, please contact:  Natividad Brood, RN BSN [facility] Liaison  [phone] business mobile phone Toll free office [phone]  updated

## 2015-04-21 NOTE — Clinical Documentation Improvement (Signed)
Cardiology  Possible Conditions?       Pneumonia  Type of Pneumonia - CAP, HCAP, Bacterial Pneumonia, Aspiration Pneumonia, Gram Negative Pneumonia, Other Condition, Unable to Clinically Determine  Document causative organism if known  Document mechanism - Aspiration, Ventilator - Associated, Radiation Induced, Other (Specify)  Other condition  Clinically Undetermined  Please update your documentation within the medical record to reflect your response to this query. Thank you.  Supporting Information:  Pt has Acute on Chronic Respiratory failure Exam in ED: Pulmonary/Chest: No respiratory distress. He has no wheezes. He has rales (up to the scapula).   CXR 03/31/2015 IMPRESSION: Emphysema and pulmonary fibrosis. Increased airspace opacity in the left mid and lower lung zones is worrisome for superimposed pneumonia.  Labs: Component     Latest Ref Rng 04/15/2015 04/15/2015 03/31/2015 04/20/2015         12:40 PM 12:57 PM   WBC     4.0 - 10.5 K/uL 10.3 10.7 (H)  4.7  RBC     4.22 - 5.81 MIL/uL 4.38 4.55  4.21 (L)  Hemoglobin     13.0 - 17.0 g/dL 12.1 (L) 12.2 (L) 15.0 11.7 (L)  HCT     39.0 - 52.0 % 39.6 40.5 44.0 37.6 (L)   Please exercise your independent, professional judgment when responding. A specific answer is not anticipated or expected.  Thank You, Alessandra Grout, RN, BSN, CCDS,Clinical Documentation Specialist:  [phone]  [phone]=[facility] [place]- Health Information Management
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
## 2015-04-22 ENCOUNTER — Inpatient Hospital Stay (HOSPITAL_COMMUNITY): Payer: Commercial Managed Care - HMO

## 2015-04-22 LAB — BASIC METABOLIC PANEL
ANION GAP: 14 (ref 5–15)
BUN: 95 mg/dL — ABNORMAL HIGH (ref 6–20)
CHLORIDE: 94 mmol/L — AB (ref 101–111)
CO2: 29 mmol/L (ref 22–32)
Calcium: 8.8 mg/dL — ABNORMAL LOW (ref 8.9–10.3)
Creatinine, Ser: 1.72 mg/dL — ABNORMAL HIGH (ref 0.61–1.24)
GFR calc Af Amer: 45 mL/min — ABNORMAL LOW (ref >60)
GFR, EST NON AFRICAN AMERICAN: 39 mL/min — AB (ref >60)
Glucose, Bld: 241 mg/dL — ABNORMAL HIGH (ref 65–99)
POTASSIUM: 3.7 mmol/L (ref 3.5–5.1)
SODIUM: 137 mmol/L (ref 135–145)

## 2015-04-22 MED ORDER — TECHNETIUM TO 99M ALBUMIN AGGREGATED
4.0000 | Freq: Once | INTRAVENOUS | Status: AC | PRN
  Administered 2015-04-22: 4 via INTRAVENOUS

## 2015-04-22 MED ORDER — TECHNETIUM TC 99M DIETHYLENETRIAME-PENTAACETIC ACID: 31.1000 | Freq: Once | INTRAVENOUS | Status: DC | PRN

## 2015-04-22 MED ORDER — DIGOXIN 125 MCG PO TABS
0.1250 mg | ORAL_TABLET | Freq: Every day | ORAL | Status: DC
  Administered 2015-04-22 – 2015-05-06 (×15): 0.125 mg via ORAL
  Filled 2015-04-22 (×15): qty 1

## 2015-04-22 MED ORDER — FUROSEMIDE 10 MG/ML IJ SOLN
40.0000 mg | Freq: Once | INTRAMUSCULAR | Status: AC
  Administered 2015-04-22: 40 mg via INTRAVENOUS
  Filled 2015-04-22: qty 4
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
## 2015-04-22 NOTE — Progress Notes (Signed)
Inpatient Diabetes Program Recommendations  AACE/ADA: New Consensus Statement on Inpatient Glycemic Control (2015)  Target Ranges:  Prepandial:   less than 140 mg/dL      Peak postprandial:   less than 180 mg/dL (1-2 hours)      Critically ill patients:  140 - 180 mg/dL   Review of Glycemic Control  Diabetes history: Impaired Glucose Tolerance Outpatient Diabetes medications: None Current orders for Inpatient glycemic control: None On solumedrol 60 mg Q6H.  Results for KELVIS, BERGER (MRN [id number]) as of 04/22/2015 10:45  Ref. Range 04/21/2015 04:20 04/22/2015 05:50  Glucose Latest Ref Range: 65-99 mg/dL 209 (H) 241 (H)   Steroid-induced hyperglycemia.  Inpatient Diabetes Program Recommendations:    Add Novolog sensitive tidwc Need HgbA1C to assess glycemic control prior to admission.  Will follow. Thank you. Lorenda Peck, RD, LDN, CDE Inpatient Diabetes Coordinator [phone]

## 2015-04-22 NOTE — Care Management Important Message (Signed)
Important Message  Patient Details  Name: [name] MRN: [id number] Date of Birth: 07-26-1945   Medicare Important Message Given:  Yes    Barb Merino Kimya Mccahill 04/22/2015, 12:44 PM
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
## 2015-04-22 NOTE — Consult Note (Signed)
   [facility] Inpatient Consult   04/22/2015  Troi [facility] [birth year] [phone] Spoke with inpatient RNCM regarding the patient being active with [facility] and also [facility].  Let her know that [facility] was also requesting an assessment for high flow oxygen when he returns home per [facility] @ [phone]. For questions, please contact: Natividad Brood, RN BSN [facility] Liaison  [phone] business mobile phone Toll free office [phone]

## 2015-04-23 LAB — BASIC METABOLIC PANEL
Anion gap: 13 (ref 5–15)
BUN: 95 mg/dL — AB (ref 6–20)
CALCIUM: 9.1 mg/dL (ref 8.9–10.3)
CO2: 32 mmol/L (ref 22–32)
Chloride: 93 mmol/L — ABNORMAL LOW (ref 101–111)
Creatinine, Ser: 1.8 mg/dL — ABNORMAL HIGH (ref 0.61–1.24)
GFR calc Af Amer: 43 mL/min — ABNORMAL LOW (ref >60)
GFR calc non Af Amer: 37 mL/min — ABNORMAL LOW (ref >60)
GLUCOSE: 214 mg/dL — AB (ref 65–99)
Potassium: 3.3 mmol/L — ABNORMAL LOW (ref 3.5–5.1)
Sodium: 138 mmol/L (ref 135–145)

## 2015-04-23 LAB — BRAIN NATRIURETIC PEPTIDE: B Natriuretic Peptide: 660.7 pg/mL — ABNORMAL HIGH (ref 0.0–100.0)

## 2015-04-23 MED ORDER — POTASSIUM CHLORIDE CRYS ER 20 MEQ PO TBCR
40.0000 meq | EXTENDED_RELEASE_TABLET | Freq: Once | ORAL | Status: AC
  Administered 2015-04-23: 40 meq via ORAL
  Filled 2015-04-23: qty 2

## 2015-04-23 MED ORDER — FUROSEMIDE 10 MG/ML IJ SOLN
40.0000 mg | Freq: Once | INTRAMUSCULAR | Status: AC
  Administered 2015-04-23: 40 mg via INTRAVENOUS
  Filled 2015-04-23: qty 4

## 2015-04-23 MED ORDER — PREDNISONE 20 MG PO TABS
40.0000 mg | ORAL_TABLET | Freq: Every day | ORAL | Status: AC
  Administered 2015-04-23 – 2015-04-25 (×3): 40 mg via ORAL
  Filled 2015-04-23 (×3): qty 2
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
## 2015-04-23 NOTE — Care Management Note (Signed)
Case Management Note  Patient Details  Name: [name] MRN: [id number] Date of Birth: 02/01/1945  Subjective/Objective:      Pt is active with [facility].  Based on their findings @ patient's home, they are recommending HFNC O2 for pt.  Pt currently on 6L/min [place] O2 and desats with any exertion.   OT attempted to discuss ST-SNF for rehab but pt adamantly refuses.  Pt's son provides meals, pt states he bathes and dresses himself and ambulates short distances.          Expected Discharge Plan:  [place]  Discharge planning Services  CM Consult  Post Acute Care Choice:  Resumption of Svcs/PTA Provider  HH Arranged:  RN, Disease Management [place] Agency:  [facility]  Status of Service:  In process, will continue to follow  Medicare Important Message Given:  Yes  Girard Cooter, RN 04/23/2015, 3:29 PM

## 2015-04-23 NOTE — Progress Notes (Signed)
SATURATION QUALIFICATIONS: (This note is used to comply with regulatory documentation for home oxygen)  Patient Saturations on 6LO2 at rest = 88-92 %  Patient Saturations on 6LO2 while Ambulating = 80-83%  Patient Saturations on 55% FiO2 face mask of oxygen while Ambulating = 83-89%  Please briefly explain why patient needs home oxygen:Pt desat to 78% at end of walk with 55% FiO2 face mask.  Took close to 6 minutes to recover to 88% and >.  Will need O2 at home.  Thanks. [place] [phone] (pager)
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
## 2015-04-24 LAB — BASIC METABOLIC PANEL
ANION GAP: 12 (ref 5–15)
BUN: 91 mg/dL — AB (ref 6–20)
CO2: 35 mmol/L — ABNORMAL HIGH (ref 22–32)
Calcium: 9.1 mg/dL (ref 8.9–10.3)
Chloride: 95 mmol/L — ABNORMAL LOW (ref 101–111)
Creatinine, Ser: 1.69 mg/dL — ABNORMAL HIGH (ref 0.61–1.24)
GFR, EST AFRICAN AMERICAN: 46 mL/min — AB (ref >60)
GFR, EST NON AFRICAN AMERICAN: 40 mL/min — AB (ref >60)
Glucose, Bld: 208 mg/dL — ABNORMAL HIGH (ref 65–99)
POTASSIUM: 3.5 mmol/L (ref 3.5–5.1)
SODIUM: 142 mmol/L (ref 135–145)

## 2015-04-24 LAB — MAGNESIUM: Magnesium: 2.2 mg/dL (ref 1.7–2.4)

## 2015-04-24 LAB — BRAIN NATRIURETIC PEPTIDE: B NATRIURETIC PEPTIDE 5: 549.5 pg/mL — AB (ref 0.0–100.0)

## 2015-04-24 NOTE — Plan of Care (Signed)
Problem: Activity: Goal: Capacity to carry out activities will improve Outcome: Progressing Discussed the need to have legs elevated while in the chair.

## 2015-04-24 NOTE — Progress Notes (Signed)
Lasix held b/p 86/48
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
## 2015-04-24 NOTE — Progress Notes (Signed)
Dr. Doylene Canard in on rounds and advised of b/p and order for Lasix to be held due b/p 94/64

## 2015-04-25 MED ORDER — FUROSEMIDE 10 MG/ML IJ SOLN
40.0000 mg | Freq: Two times a day (BID) | INTRAMUSCULAR | Status: DC
  Administered 2015-04-26: 40 mg via INTRAVENOUS
  Filled 2015-04-25: qty 4

## 2015-04-25 NOTE — Plan of Care (Signed)
Problem: Education: Goal: Ability to demonstrate managment of disease process will improve Outcome: Progressing Discussed plan of care with fluids and importance of elevated legs while in the chair with teach back.
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
## 2015-04-25 NOTE — Plan of Care (Signed)
Problem: Activity: Goal: Capacity to carry out activities will improve Outcome: Progressing Discussed the importanmce of keeping legs elevated while in chair.

## 2015-04-26 ENCOUNTER — Inpatient Hospital Stay (HOSPITAL_COMMUNITY): Payer: Commercial Managed Care - HMO

## 2015-04-26 ENCOUNTER — Other Ambulatory Visit: Payer: Self-pay

## 2015-04-26 LAB — BASIC METABOLIC PANEL
Anion gap: 10 (ref 5–15)
Anion gap: 9 (ref 5–15)
BUN: 91 mg/dL — AB (ref 6–20)
BUN: 88 mg/dL — AB (ref 6–20)
CALCIUM: 8.6 mg/dL — AB (ref 8.9–10.3)
CALCIUM: 8.6 mg/dL — AB (ref 8.9–10.3)
CHLORIDE: 96 mmol/L — AB (ref 101–111)
CO2: 35 mmol/L — ABNORMAL HIGH (ref 22–32)
CO2: 33 mmol/L — ABNORMAL HIGH (ref 22–32)
CREATININE: 1.42 mg/dL — AB (ref 0.61–1.24)
CREATININE: 1.38 mg/dL — AB (ref 0.61–1.24)
Chloride: 94 mmol/L — ABNORMAL LOW (ref 101–111)
GFR calc Af Amer: 57 mL/min — ABNORMAL LOW (ref >60)
GFR, EST AFRICAN AMERICAN: 59 mL/min — AB (ref >60)
GFR, EST NON AFRICAN AMERICAN: 49 mL/min — AB (ref >60)
GFR, EST NON AFRICAN AMERICAN: 51 mL/min — AB (ref >60)
GLUCOSE: 220 mg/dL — AB (ref 65–99)
Glucose, Bld: 153 mg/dL — ABNORMAL HIGH (ref 65–99)
POTASSIUM: 4.3 mmol/L (ref 3.5–5.1)
Potassium: 3.6 mmol/L (ref 3.5–5.1)
SODIUM: 141 mmol/L (ref 135–145)
Sodium: 136 mmol/L (ref 135–145)

## 2015-04-26 LAB — BRAIN NATRIURETIC PEPTIDE: B NATRIURETIC PEPTIDE 5: 635.8 pg/mL — AB (ref 0.0–100.0)

## 2015-04-26 LAB — CBC
HEMATOCRIT: 40.5 % (ref 39.0–52.0)
Hemoglobin: 12.3 g/dL — ABNORMAL LOW (ref 13.0–17.0)
MCH: 27.6 pg (ref 26.0–34.0)
MCHC: 30.4 g/dL (ref 30.0–36.0)
MCV: 90.8 fL (ref 78.0–100.0)
PLATELETS: 170 10*3/uL (ref 150–400)
RBC: 4.46 MIL/uL (ref 4.22–5.81)
RDW: 20.6 % — AB (ref 11.5–15.5)
WBC: 8.9 10*3/uL (ref 4.0–10.5)

## 2015-04-26 MED ORDER — FUROSEMIDE 10 MG/ML IJ SOLN
60.0000 mg | Freq: Two times a day (BID) | INTRAMUSCULAR | Status: DC
  Administered 2015-04-26 – 2015-04-28 (×4): 60 mg via INTRAVENOUS
  Filled 2015-04-26 (×4): qty 6

## 2015-04-26 MED ORDER — VANCOMYCIN HCL 10 G IV SOLR
2000.0000 mg | Freq: Once | INTRAVENOUS | Status: AC
  Administered 2015-04-26: 2000 mg via INTRAVENOUS
  Filled 2015-04-26 (×2): qty 2000

## 2015-04-26 MED ORDER — PIPERACILLIN-TAZOBACTAM 3.375 G IVPB
3.3750 g | Freq: Three times a day (TID) | INTRAVENOUS | Status: DC
  Administered 2015-04-26 – 2015-04-28 (×7): 3.375 g via INTRAVENOUS
  Filled 2015-04-26 (×8): qty 50

## 2015-04-26 MED ORDER — VANCOMYCIN HCL 10 G IV SOLR
1250.0000 mg | INTRAVENOUS | Status: DC
  Administered 2015-04-27: 1250 mg via INTRAVENOUS
  Filled 2015-04-26 (×2): qty 1250

## 2015-04-26 NOTE — Patient Outreach (Signed)
Telephone call to Stasia Cavalier, patient's son and emergency contact. Mr. Megan Salon was able to provide information to satisfy HIPPA identifiers by providing date of birth and address.  Mr. Megan Salon updated this RNCM that patient remains hospitalized, anticipated discharge is today or tomorrow. Mr. Megan Salon states he feels very hopeful if his oxygen levels remains stable.  Plan: Transition of care call 24-72 hours after discharge from acute care.

## 2015-04-26 NOTE — Progress Notes (Signed)
OT Cancellation Note  Patient Details Name: [name] MRN: [id number] DOB: 1945-09-05   Cancelled Treatment:    Reason Eval/Treat Not Completed: Patient not medically ready (oxygen 75% to 90% now on non rebreather) On arrival patient with oxygen saturations 80-85% on nasal cannula. OT contacting RN Nadine regarding o2 levels. OT returning to room and placing patient on non rebreather on 15L. Pt with oxygen levels 75% to 90% just sitting in chair. Pt unable to maintain oxygen levels 90% at this time, SOB and lethargic. Ot to hold treatment at this time. RN in room to address patient. Respiratory therapist in hall notified.   Vonita Moss   OTR/L Pager: [phone] Office: [phone] .  04/26/2015, 1:10 PM
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
## 2015-04-26 NOTE — Progress Notes (Signed)
Pt stated that he is unable to tolerate the HFNC due to discomfort, pt taken off HFNC by RN and placed back on NRB.  RT will monitor.

## 2015-04-26 NOTE — Care Management Important Message (Signed)
Important Message  Patient Details  Name: [name] MRN: [id number] Date of Birth: [birth year]   Medicare Important Message Given:  Yes    Loann Quill 04/26/2015, 12:37 PM

## 2015-04-26 NOTE — Progress Notes (Signed)
04/26/2015 Patient saturation on non re breather fluctuate in the mid 80's  And in the low to mid 90's. Dr Terrence Dupont was made aware. To was suggested by RT to see if patient could be place on high flow oxygen order was given by Dr Terrence Dupont. [facility] RN.

## 2015-04-26 NOTE — Progress Notes (Signed)
PT Cancellation Note  Patient Details Name: [name] MRN: [id number] DOB: January 13, 1946   Cancelled Treatment:    Reason Eval/Treat Not Completed: Medical issues which prohibited therapy (Desat 82-88% on 100% nonrebreather mask.  HOLD today. )   Annabeth Tortora F 04/26/2015, 10:31 AM  [facility] [phone] (pager)

## 2015-04-26 NOTE — Progress Notes (Signed)
Pt placed on HFNC per MD order due to increase in Fio2 demand.. Pt tolerating well, RT will monitor

## 2015-04-26 NOTE — Progress Notes (Signed)
Pharmacy Antibiotic Note  [name] is a 70 y.o. male admitted on 04/18/2015 with progressive worsening of SOB. Found to have possible HAP / aspiration PNA. Pharmacy has been consulted for Zosyn and vancomycin dosing. SCr 1.42, normalized CrCl ~4m/min.  Plan: Start Zosyn 3.375 gm IV q8h (4 hour infusion) Give vancomycin 2g IV x 1, then start vancomycin '1250mg'$  IV Q24 Monitor clinical picture, renal function, VT at Css F/U C&S, abx deescalation / LOT   Height: '5\' 9"'$  (175.3 cm) Weight: 239 lb 3.2 oz (108.5 kg) IBW/kg (Calculated) : 70.7  Temp (24hrs), Avg:97.6 F (36.4 C), Min:97.4 F (36.3 C), Max:97.8 F (36.6 C)   Recent Labs Lab 04/16/2015 1240  04/20/15 0515 04/21/15 0420 04/22/15 0550 04/23/15 0535 04/24/15 0429 04/26/15 0516  WBC 10.7*  --  4.7  --   --   --   --   --   CREATININE 2.03*  < > 1.93* 1.79* 1.72* 1.80* 1.69* 1.42*  < > = values in this interval not displayed.  Estimated Creatinine Clearance: 59.6 mL/min (by C-G formula based on Cr of 1.42).    Allergies  Allergen Reactions  . Codeine Other (See Comments)    Urinary retention    Antimicrobials this admission: Zosyn 3/27 >>  Vancomycin 3/27 >>   Dose adjustments this admission: n/a  Microbiology results: 3/27 BCx: sent 3/21 MRSA PCR: negative  Thank you for allowing pharmacy to be a part of this patient's care.  NElenor Quinones PharmD, BCPS Clinical Pharmacist Pager [phone] 92633/27/2017 12:22 PM

## 2015-04-27 ENCOUNTER — Other Ambulatory Visit: Payer: Self-pay

## 2015-04-27 MED ORDER — PREDNISONE 20 MG PO TABS
40.0000 mg | ORAL_TABLET | Freq: Every day | ORAL | Status: DC
  Administered 2015-04-28: 40 mg via ORAL
  Filled 2015-04-27: qty 2

## 2015-04-27 NOTE — Patient Outreach (Signed)
Chart review for disposition. Will continue to follow for discharge planning.  Plan: Chart review on 04/29/2015 for discharge

## 2015-04-27 NOTE — Plan of Care (Signed)
Problem: Education: Goal: Ability to demonstrate managment of disease process will improve Outcome: Progressing Keep emphasizing his need to elevate his extremities for return circulation; patient likes to dangle legs in chair.
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
## 2015-04-27 NOTE — Clinical Social Work Note (Cosign Needed)
Clinical Social Work Assessment  Patient Details  Name: [name] MRN: [id number] Date of Birth: 1945-09-02  Date of referral:  04/27/15               Reason for consult:  Facility Placement                Permission sought to share information with:  Facility Sport and exercise psychologist, Case Manager, Guardian Permission granted to share information::  Yes, Release of Information Signed  Name::      Doctor, hospital)  Agency::     Relationship::   (son legal guardian)  Sport and exercise psychologist Information:   (contact Stasia Cavalier (brother) for [place] number)  Housing/Transportation Living arrangements for the past 2 months:  [place] of Information:  Patient Patient Interpreter Needed:  None Criminal Activity/Legal Involvement Pertinent to Current Situation/Hospitalization:  No - Comment as needed Significant Relationships:  Adult Children Lives with:    Do you feel safe going back to the place where you live?  Yes Need for family participation in patient care:  Yes (Comment)  Care giving concerns:  Patient leaves alone, can not care for himself by hisself.   Social Worker assessment / plan:  BSW intern enters patient room, patient is alert and oriented. Patient lives alone by hisself and does not have 24 hour care at home. Patient is refusing SNF at this time. Patient does want to set up home health instead. BSW intern  notified case manager of patient choice. Patient son [name] is his legal guardian. BSW intern will follow up with son regarding patients decision. Patient has no concerns or questions at this time. Patient does state he is on about 5L of oxygen at home.   Employment status:  Disabled (Comment on whether or not currently receiving Disability) Insurance information:  Medicare Personnel officer) PT Recommendations:  [place] / Referral to community resources:  Acute Rehab  Patient/Family's Response to care:  Patient has good response  to care and feel like home health is the right choice.  Patient/Family's Understanding of and Emotional Response to Diagnosis, Current Treatment, and Prognosis:  Patient is aware of his condition and current treatment and planning.   Emotional Assessment Appearance:  Appears stated age Attitude/Demeanor/Rapport:  Unable to Assess (drowsey couldnt really tell) Affect (typically observed):  Unable to Assess Orientation:  Oriented to Self, Oriented to Place, Oriented to  Time, Oriented to Situation Alcohol / Substance use:  Alcohol Use (quit drinking in 2012, former smoker) Psych involvement (Current and /or in the community):  No (Comment)  Discharge Needs  Concerns to be addressed:  No discharge needs identified Readmission within the last 30 days:  No Current discharge risk:  None Barriers to Discharge:  No Barriers Identified   [place] intern  [phone]
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
## 2015-04-28 ENCOUNTER — Other Ambulatory Visit: Payer: Self-pay

## 2015-04-28 DIAGNOSIS — J9621 Acute and chronic respiratory failure with hypoxia: Principal | ICD-10-CM

## 2015-04-28 DIAGNOSIS — I509 Heart failure, unspecified: Secondary | ICD-10-CM

## 2015-04-28 DIAGNOSIS — J810 Acute pulmonary edema: Secondary | ICD-10-CM

## 2015-04-28 DIAGNOSIS — I5033 Acute on chronic diastolic (congestive) heart failure: Secondary | ICD-10-CM

## 2015-04-28 LAB — TROPONIN I: Troponin I: 0.17 ng/mL — ABNORMAL HIGH (ref <0.031)

## 2015-04-28 MED ORDER — LEVOFLOXACIN 750 MG PO TABS
750.0000 mg | ORAL_TABLET | ORAL | Status: DC
  Administered 2015-04-28 – 2015-04-29 (×2): 750 mg via ORAL
  Filled 2015-04-28 (×3): qty 1

## 2015-04-28 MED ORDER — MORPHINE SULFATE (PF) 2 MG/ML IV SOLN
1.0000 mg | INTRAVENOUS | Status: DC | PRN
  Administered 2015-04-28 – 2015-05-05 (×10): 1 mg via INTRAVENOUS
  Filled 2015-04-28 (×11): qty 1

## 2015-04-28 MED ORDER — NITROGLYCERIN 0.4 MG SL SUBL
SUBLINGUAL_TABLET | SUBLINGUAL | Status: AC
  Administered 2015-04-28: 0.4 mg via SUBLINGUAL
  Filled 2015-04-28: qty 1

## 2015-04-28 MED ORDER — FUROSEMIDE 10 MG/ML IJ SOLN
40.0000 mg | Freq: Four times a day (QID) | INTRAMUSCULAR | Status: AC
  Administered 2015-04-28 – 2015-04-29 (×3): 40 mg via INTRAVENOUS
  Filled 2015-04-28 (×3): qty 4

## 2015-04-28 MED ORDER — NITROGLYCERIN 0.4 MG SL SUBL
0.4000 mg | SUBLINGUAL_TABLET | SUBLINGUAL | Status: DC | PRN
  Administered 2015-04-28 – 2015-04-30 (×3): 0.4 mg via SUBLINGUAL

## 2015-04-28 MED ORDER — METHYLPREDNISOLONE SODIUM SUCC 40 MG IJ SOLR
40.0000 mg | Freq: Three times a day (TID) | INTRAMUSCULAR | Status: AC
  Administered 2015-04-28 – 2015-04-29 (×3): 40 mg via INTRAVENOUS
  Filled 2015-04-28 (×3): qty 1
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
## 2015-04-28 NOTE — Significant Event (Signed)
Rapid Response Event Note Called to see pt with c/o 10/10 CP & increased WOB Overview: Time Called: 1950 Arrival Time: 1953 Event Type: Cardiac  Initial Focused Assessment:  On arrival to the floor he is sitting in the recliner and appears uncomfortable.  Initially he was reluctant to describe his pain, but discloses it is 7/10 substernal and is not reproducible with palpation. He is 96% on NRB RR 24 without evidence of use of accessory muscles or increased effort. EKG completed. He states he feels like his breathing has improved.  Discussed pt with Dr. Vaughan Browner and SL NTG given as well as drawing troponin. Pain relieved to 2/10 with SL NTG. Scheduled xopenex tx given. Will cont. To monitor. Interventions: SL NTG x1  Event Summary: Name of Physician Notified: Dr. Terrence Dupont at 2000  Name of Consulting Physician Notified: Dr. Vaughan Browner at 2003        Wessley Emert Hedgecock

## 2015-04-28 NOTE — Progress Notes (Addendum)
eLink Physician-Brief Progress Note Patient Name: [name] DOB: [birth year] MRN: [id number]   Date of Service  04/28/2015  HPI/Events of Note  Substernal chest pain  eICU Interventions  Check EKG, troponin. On dopamine for low BP. SL NTG and morphine for pain.     Intervention Category Intermediate Interventions: Pain - evaluation and management  Tate Jerkins 04/28/2015, 8:04 PM

## 2015-04-28 NOTE — Progress Notes (Signed)
E Link MD Mannam made aware of pt current troponin level at .17. Pt currently chest pain free, no increased work of breathing, no complaints of shortness of breath. No new orders put in at this time. Will monitor and continue to watch pt for signs of distress.
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
## 2015-04-28 NOTE — Progress Notes (Signed)
Pharmacy Antibiotic Note  [name] is a 70 y.o. male admitted on 04/03/2015 with progressive worsening of SOB. Found to have possible HAP / aspiration PNA.   Plan: D/C zosyn, vancomycin Levofloxacin PO 750 mg Q24h   Height: '5\' 9"'$  (175.3 cm) Weight: 238 lb 12.1 oz (108.3 kg) IBW/kg (Calculated) : 70.7  Temp (24hrs), Avg:98.6 F (37 C), Min:98.2 F (36.8 C), Max:99 F (37.2 C)   Recent Labs Lab 04/22/15 0550 04/23/15 0535 04/24/15 0429 04/26/15 0516 04/26/15 1354  WBC  --   --   --   --  8.9  CREATININE 1.72* 1.80* 1.69* 1.42* 1.38*    Estimated Creatinine Clearance: 61.2 mL/min (by C-G formula based on Cr of 1.38).    Allergies  Allergen Reactions  . Codeine Other (See Comments)    Urinary retention    Antimicrobials this admission: Zosyn 3/27 >> 3/29 Vancomycin 3/27 >> 3/29 Levofloxacin 3/29>>  Dose adjustments this admission: n/a  Microbiology results: 3/27 BCx: sent 3/21 MRSA PCR: negative  Thank you for allowing pharmacy to be a part of this patient's care.  Levester Fresh, PharmD, BCPS, [facility] Clinical Pharmacist Pager [phone] 04/28/2015 1:01 PM

## 2015-04-29 ENCOUNTER — Inpatient Hospital Stay (HOSPITAL_COMMUNITY): Payer: Commercial Managed Care - HMO

## 2015-04-29 DIAGNOSIS — I5043 Acute on chronic combined systolic (congestive) and diastolic (congestive) heart failure: Secondary | ICD-10-CM

## 2015-04-29 LAB — TROPONIN I
TROPONIN I: 0.16 ng/mL — AB (ref <0.031)
Troponin I: 0.14 ng/mL — ABNORMAL HIGH (ref <0.031)
Troponin I: 0.11 ng/mL — ABNORMAL HIGH (ref <0.031)

## 2015-04-29 MED ORDER — ALPRAZOLAM 0.5 MG PO TABS
1.0000 mg | ORAL_TABLET | Freq: Three times a day (TID) | ORAL | Status: DC | PRN
  Administered 2015-04-30 – 2015-05-01 (×2): 1 mg via ORAL
  Filled 2015-04-29 (×3): qty 2

## 2015-04-29 MED ORDER — ARFORMOTEROL TARTRATE 15 MCG/2ML IN NEBU
15.0000 ug | INHALATION_SOLUTION | Freq: Two times a day (BID) | RESPIRATORY_TRACT | Status: DC
  Administered 2015-04-29 – 2015-05-06 (×15): 15 ug via RESPIRATORY_TRACT
  Filled 2015-04-29 (×15): qty 2

## 2015-04-29 MED ORDER — METHYLPREDNISOLONE SODIUM SUCC 40 MG IJ SOLR
40.0000 mg | Freq: Four times a day (QID) | INTRAMUSCULAR | Status: AC
  Administered 2015-04-29 – 2015-04-30 (×3): 40 mg via INTRAVENOUS
  Filled 2015-04-29 (×3): qty 1

## 2015-04-29 MED ORDER — FUROSEMIDE 80 MG PO TABS
80.0000 mg | ORAL_TABLET | Freq: Two times a day (BID) | ORAL | Status: DC
Start: 2015-04-29 — End: 2015-05-01
  Administered 2015-04-29 – 2015-05-01 (×5): 80 mg via ORAL
  Filled 2015-04-29 (×5): qty 1

## 2015-04-29 MED ORDER — SODIUM CHLORIDE 0.9 % IV BOLUS (SEPSIS): 1000.0000 mL | Freq: Once | INTRAVENOUS | Status: DC

## 2015-04-29 MED ORDER — BUDESONIDE 0.5 MG/2ML IN SUSP
0.5000 mg | Freq: Two times a day (BID) | RESPIRATORY_TRACT | Status: DC
  Administered 2015-04-29 – 2015-05-06 (×15): 0.5 mg via RESPIRATORY_TRACT
  Filled 2015-04-29 (×15): qty 2

## 2015-04-29 NOTE — Progress Notes (Signed)
E link MD made aware of pts BP systolic in the 89Q. Pt has no complaint of pain, ShoB, diaphoresis, or chest pain and is resting comfortably at this time with eyes closed. MD also made aware of 2nd troponin drawn at 2am is 0.16. No new orders at this time. Will continue to monitor pt and watch for any signs of distress.

## 2015-04-29 NOTE — NC FL2 (Signed)
[place] LEVEL OF CARE SCREENING TOOL     IDENTIFICATION  Patient Name: [name] Birthdate: Nov 06, 1945 Sex: male Admission Date (Current Location): 04/22/2015  [facility] and [place] Number:  Herbalist and Address:  [facility], [place] [address], [place], [place] [address]      Provider Number: [id number]  Attending Physician Name and Address:  [name], MD  Relative Name and Phone Number:   Stasia Cavalier [phone])    Current Level of Care: Hospital Recommended Level of Care: [place] Prior Approval Number:    Date Approved/Denied:   PASRR Number:    Discharge Plan: SNF    Current Diagnoses: Patient Active Problem List   Diagnosis Date Noted  . CHF exacerbation ([place]) 04/03/2015  . Acute on chronic congestive heart failure with left ventricular diastolic dysfunction ([place]) 04/22/2015  . Acute on chronic respiratory failure with hypoxemia ([place])   . Flu   . HCAP (healthcare-associated pneumonia)   . Pulmonary emphysema ([place]) 02/26/2015  . Encounter for therapeutic drug monitoring 02/26/2015  . Hypokalemia 02/19/2015  . Hypoalbuminemia due to protein-calorie malnutrition ([place]) 02/19/2015  . Chronic renal insufficiency 02/19/2015  . Acute on chronic congestive heart failure ([place])   . SOB (shortness of breath)   . Diastolic congestive heart failure due to valvular disease ([place]) 02/06/2015  . Acute on chronic respiratory failure ([place]) 01/19/2015  . Acute dyspnea 12/30/2014  . Acute diastolic (congestive) heart failure ([place]) 12/30/2014  . Acute-on-chronic kidney injury ([place]) 12/30/2014  . Hypotension 12/30/2014  . Troponin level elevated   . DNR no code (do not resuscitate) 11/05/2014  . Biventricular congestive heart failure ([place]) 04/27/2014  . Acute on chronic left systolic heart failure ([place]) 04/11/2014  . COPD exacerbation ([place]) 12/15/2013  . Acute systolic heart failure ([place]) 12/15/2013   . Dyspnea 10/31/2013  . COPD (chronic obstructive pulmonary disease) with emphysema ([place]) 01/22/2013  . Chronic respiratory failure ([place]) 07/10/2012  . Chronic diastolic heart failure ([place]) 07/10/2012  . Acute respiratory failure ([place]) 06/28/2012  . AKI (acute kidney injury) ([place]) 06/22/2012  . Dehydration 06/22/2012  . Obesity 06/12/2012  . Restrictive lung disease secondary to obesity 12/22/2011  . Hypertension   . Dyslipidemia   . Glucose intolerance (impaired glucose tolerance)   . Chronic venous insufficiency   . Lung cancer ([place]) 11/24/2011    Orientation RESPIRATION BLADDER Height & Weight     Self, Time, Situation, Place   (Decreased breath sound at bases with occasional bilateral rhonchi noted (venturimask)) Continent Weight: 238 lb 12.1 oz (108.3 kg) Height:  '5\' 9"'$  (175.3 cm)  BEHAVIORAL SYMPTOMS/MOOD NEUROLOGICAL BOWEL NUTRITION STATUS     (Negative for dizziness and headaches.) Continent Diet (regular)  AMBULATORY STATUS COMMUNICATION OF NEEDS Skin   Limited Assist Verbally Normal                       Personal Care Assistance Level of Assistance  Bathing, Dressing, Feeding Bathing Assistance: Limited assistance Feeding assistance: Limited assistance Dressing Assistance: Limited assistance     Functional Limitations Info  Sight, Hearing, Speech Sight Info: Adequate Hearing Info: Adequate Speech Info: Adequate    SPECIAL CARE FACTORS FREQUENCY  PT (By licensed PT), OT (By licensed OT)     PT Frequency: Min 3X/week OT Frequency: Min 2X/week            Contractures      Additional Factors Info  Code Status, Allergies  Code Status Info: partial code Allergies Info: codeine           Current Medications (04/29/2015):  This is the current hospital active medication list Current Facility-Administered Medications  Medication Dose Route Frequency Provider Last Rate Last Dose  . 0.9 %  sodium chloride infusion  250 mL Intravenous PRN [name], MD      . acetaminophen (TYLENOL) tablet 650 mg  650 mg Oral Q4H PRN [name], MD   650 mg at 04/25/15 2133  . ALPRAZolam (XANAX) tablet 1 mg  1 mg Oral TID PRN Flora Lipps, MD      . antiseptic oral rinse (CPC / CETYLPYRIDINIUM CHLORIDE 0.05%) solution 7 mL  7 mL Mouth Rinse BID [name], MD   7 mL at 04/29/15 0936  . digoxin (LANOXIN) tablet 0.125 mg  0.125 mg Oral Daily [name], MD   0.125 mg at 04/29/15 0936  . DOPamine (INTROPIN) 800 mg in dextrose 5 % 250 mL (3.2 mg/mL) infusion  2.5 mcg/kg/min Intravenous Continuous [name], MD 5.2 mL/hr at 04/28/15 1814 2.5 mcg/kg/min at 04/28/15 1814  . furosemide (LASIX) tablet 80 mg  80 mg Oral BID [name], MD   80 mg at 04/29/15 0939  . heparin injection 5,000 Units  5,000 Units Subcutaneous 3 times per day [name], MD   5,000 Units at 04/29/15 [phone]  . levalbuterol (XOPENEX) nebulizer solution 1.25 mg  1.25 mg Nebulization QID [name], MD   1.25 mg at 04/29/15 1127  . levofloxacin (LEVAQUIN) tablet 750 mg  750 mg Oral Q24H [name], MD   750 mg at 04/28/15 1553  . metolazone (ZAROXOLYN) tablet 5 mg  5 mg Oral Daily [name], MD   5 mg at 04/29/15 0936  . morphine 2 MG/ML injection 1 mg  1 mg Intravenous Q4H PRN Praveen Mannam, MD   1 mg at 04/29/15 0053  . nitroGLYCERIN (NITROSTAT) SL tablet 0.4 mg  0.4 mg Sublingual Q5 min PRN Praveen Mannam, MD   0.4 mg at 04/28/15 2008  . ondansetron (ZOFRAN) injection 4 mg  4 mg Intravenous Q6H PRN [name], MD      . potassium chloride SA (K-DUR,KLOR-CON) CR tablet 20 mEq  20 mEq Oral Daily [name], MD   20 mEq at 04/29/15 0936  . sodium chloride flush (NS) 0.9 % injection 3 mL  3 mL Intravenous Q12H [name], MD   10 mL at 04/29/15 0951  . sodium chloride flush (NS) 0.9 % injection 3 mL  3 mL Intravenous PRN [name], MD      . technetium TC 5M diethylenetriame-pentaacetic acid (DTPA) injection 31.1 milli Curie  31.1 milli Curie  Intravenous Once PRN [name], MD       Facility-Administered Medications Ordered in Other Encounters  Medication Dose Route Frequency Provider Last Rate Last Dose  . sodium chloride 0.9 % injection 10 mL  10 mL Intracatheter PRN Curt Bears, MD   10 mL at 09/03/12 1534  . sodium chloride 0.9 % injection 10 mL  10 mL Intracatheter PRN Curt Bears, MD   10 mL at 03/04/15 1321     Discharge Medications: Please see discharge summary for a list of discharge medications.  Relevant Imaging Results:  Relevant Lab Results:   Additional Information SS# [id number] Minta Balsam  BSW intern  [phone]

## 2015-04-29 NOTE — Progress Notes (Signed)
BSW intern spoke with son Orie Fisherman) son is agreeable with his dad going to SNF. Son ask BSW intern to leave SNF packet in room with patient. Son flew in from [place] and will be up here later today. BSW intern faxed out patient to facilities and has completed FL2.  BSW intern will follow up with son with bed offers.   No futhur needs at this time  BSW intern is signing off.Marland Kitchen  Minta Balsam  BSW intern  [phone]  Domenica Reamer, [place] Social Worker [phone]

## 2015-04-29 NOTE — Progress Notes (Signed)
PT Cancellation Note  Patient Details Name: [name] MRN: [id number] DOB: 1945/10/19   Cancelled Treatment:    Reason Eval/Treat Not Completed: Medical issues which prohibited therapy. Pt still dependent on nonrebreather mask, had chest pain last night. RN requested to hold for today.   Colon Branch, SPT Colon Branch 04/29/2015, 9:14 AM
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
## 2015-04-29 NOTE — Consult Note (Signed)
   Name: [name] MRN: [id number] DOB: [birth year]    ADMISSION DATE:  04/15/2015 CONSULTATION DATE:  04/28/2015  REFERRING MD :  Terrence Dupont  CHIEF COMPLAINT:  Hypoxemic respiratory failure.  HISTORY OF PRESENT ILLNESS:  70 year old male with PMH as below, which includes recurrent non-small cell lung carcinoma status post left lobectomy, on immunotherapy, COPD, interstitial lung disease, severe pulmonary hypertension with pulmonary fibrosis, CHF with preserved systolic function, OSA, stage IV kidney disease, and chronic respiratory failure on home 2 L. He was recently admitted 03/2014 for acute on chronic respiratory failure.  SIGNIFICANT EVENTS  3/2- pt admitted for SOB 3/29 transferred to SDU for resp distress. PCCM consulted. Pt is DNI  STUDIES:   ANTIBIOTICS : Levaquin (3/29)  CULTURES : Blood culture 3/27 >>    SUBJECTIVE: C/O of SOB, orthopnea an PND.  VITAL SIGNS: Temp:  [97.3 F (36.3 C)-97.7 F (36.5 C)] 97.3 F (36.3 C) (03/30 0800) Pulse Rate:  [82-104] 96 (03/30 1300) Resp:  [12-33] 29 (03/30 1300) BP: (73-115)/(52-75) 85/64 mmHg (03/30 1300) SpO2:  [73 %-100 %] 73 % (03/30 1300) FiO2 (%):  [55 %] 55 % (03/30 1128)  PHYSICAL EXAMINATION: General:  Chronically ill appearing male, in mild respiratory distress. Neuro:  Alert and interactive, moving all ext to command. HEENT:  [place]/AT, PERRL, EOM-I and MMM. Cardiovascular:  RRR, Nl S1/S2, -M/R/G. Lungs:  Diffuse crackles. Abdomen:  Soft, NT, ND an d+BS. Musculoskeletal:  -edema and -tenderness. Skin:  Intact.   Recent Labs Lab 04/24/15 0429 04/26/15 0516 04/26/15 1354  NA 142 141 136  K 3.5 3.6 4.3  CL 95* 96* 94*  CO2 35* 35* 33*  BUN 91* 91* 88*  CREATININE 1.69* 1.42* 1.38*  GLUCOSE 208* 153* 220*    Recent Labs Lab 04/26/15 1354  HGB 12.3*  HCT 40.5  WBC 8.9  PLT 170   No results found.  ASSESSMENT / PLAN:  70 year old male with history of ILD and NSCLC who presented with SOB and  was noted to have positive troponin.  Patient was admitted by cardiology and some diureses was started.  Patient has steroid dependent COPD and ILD with severe pulmonary HTN and is O2 dependent on 5L at home.  Pt clinically deteriorated and required mor FiO2. Transferred to SDU. On NRBM 70%.   - Solumedrol 40 mg IV q6 hours. - Titrate O2 for sat of 88-90%. On NRBM at 70%. Pt did NOT like high flow o2.  - Diuresis as tolerated. - IS per RT protocol. - Flutter valve to assist with airway clearance. - Pt is DNI.  - cont levaquin for now    [facility], [place]  04/29/2015, 2:24 PM

## 2015-04-29 NOTE — Care Management Important Message (Signed)
Important Message  Patient Details  Name: [name] MRN: [id number] Date of Birth: 12-01-1945   Medicare Important Message Given:  Yes    Nathen May 04/29/2015, 2:06 PM

## 2015-04-29 NOTE — Progress Notes (Signed)
eLink Physician-Brief Progress Note Patient Name: [name] DOB: [birth year] MRN: [id number]   Date of Service  04/29/2015  HPI/Events of Note  Multiple issues: 1. Chest pain - s/p solumedrol. Relief with NTG X 1. 2. Hypotension - s/p NTG. BP = 72/57.  EKG - NSR, RVH, non-specific ST-T changes.  eICU Interventions  Will order:  1. Cycle Troponin. 2. 0.9 NaCl 1 liter IV over 1 hour now.      Intervention Category Major Interventions: Hypotension - evaluation and management;Other:  Lysle Dingwall 04/29/2015, 9:57 PM

## 2015-04-30 DIAGNOSIS — J9621 Acute and chronic respiratory failure with hypoxia: Secondary | ICD-10-CM

## 2015-04-30 LAB — BASIC METABOLIC PANEL
ANION GAP: 10 (ref 5–15)
BUN: 99 mg/dL — ABNORMAL HIGH (ref 6–20)
CALCIUM: 8.4 mg/dL — AB (ref 8.9–10.3)
CO2: 32 mmol/L (ref 22–32)
CREATININE: 2.11 mg/dL — AB (ref 0.61–1.24)
Chloride: 90 mmol/L — ABNORMAL LOW (ref 101–111)
GFR calc Af Amer: 35 mL/min — ABNORMAL LOW (ref >60)
GFR calc non Af Amer: 30 mL/min — ABNORMAL LOW (ref >60)
GLUCOSE: 161 mg/dL — AB (ref 65–99)
Potassium: 4.2 mmol/L (ref 3.5–5.1)
Sodium: 132 mmol/L — ABNORMAL LOW (ref 135–145)

## 2015-04-30 LAB — TROPONIN I: TROPONIN I: 0.13 ng/mL — AB (ref <0.031)

## 2015-04-30 MED ORDER — LEVOFLOXACIN 750 MG PO TABS
750.0000 mg | ORAL_TABLET | ORAL | Status: DC
  Administered 2015-05-01 – 2015-05-05 (×3): 750 mg via ORAL
  Filled 2015-04-30 (×6): qty 1

## 2015-04-30 MED ORDER — METHYLPREDNISOLONE SODIUM SUCC 40 MG IJ SOLR
40.0000 mg | Freq: Three times a day (TID) | INTRAMUSCULAR | Status: AC
  Administered 2015-04-30 – 2015-05-01 (×3): 40 mg via INTRAVENOUS
  Filled 2015-04-30 (×3): qty 1

## 2015-04-30 MED ORDER — MAGNESIUM HYDROXIDE 400 MG/5ML PO SUSP
30.0000 mL | Freq: Every day | ORAL | Status: DC | PRN
  Administered 2015-04-30: 30 mL via ORAL
  Filled 2015-04-30: qty 30

## 2015-04-30 NOTE — Progress Notes (Signed)
Name: [name] MRN: [id number] DOB: 10/15/1945    ADMISSION DATE:  03/31/2015 CONSULTATION DATE:  04/28/2015  REFERRING MD :  [name]  CHIEF COMPLAINT:  Hypoxemic respiratory failure.  HISTORY OF PRESENT ILLNESS:  70 year old male with PMH as below, which includes recurrent non-small cell lung carcinoma status post left lobectomy, on immunotherapy, COPD, interstitial lung disease, severe pulmonary hypertension with pulmonary fibrosis, CHF with preserved systolic function, OSA, stage IV kidney disease, and chronic respiratory failure on home 2 L. He was recently admitted 03/2014 for acute on chronic respiratory failure.  SIGNIFICANT EVENTS  3/2- pt admitted for SOB 3/29 transferred to SDU for resp distress. PCCM consulted. Pt is DNI  STUDIES:   ANTIBIOTICS : Levaquin (3/29) >>  CULTURES : Blood culture 3/27 >> (-)   SUBJECTIVE:  Had cp last night. Troponin unchanged. Denies cp now. Remains SOB  VITAL SIGNS: Temp:  [97.4 F (36.3 C)-98.1 F (36.7 C)] 97.4 F (36.3 C) (03/31 0900) Pulse Rate:  [59-102] 93 (03/31 0900) Resp:  [7-29] 19 (03/31 0900) BP: (85-145)/(61-99) 89/69 mmHg (03/31 0900) SpO2:  [73 %-95 %] 89 % (03/31 0630)  PHYSICAL EXAMINATION: General:  Chronically ill appearing male, in mild respiratory distress. On NRBM Neuro:  Alert and interactive, moving all ext to command. HEENT:  [name]/AT, PERRL, EOM-I and MMM. Cardiovascular:  RRR, Nl S1/S2, -M/R/G. Lungs:  Diffuse crackles. Abdomen:  Soft, NT, ND an d+BS. Musculoskeletal:  (+) Gr 2-3 edema and Skin:  Intact.   Recent Labs Lab 04/26/15 0516 04/26/15 1354 04/30/15 0448  NA 141 136 132*  K 3.6 4.3 4.2  CL 96* 94* 90*  CO2 35* 33* 32  BUN 91* 88* 99*  CREATININE 1.42* 1.38* 2.11*  GLUCOSE 153* 220* 161*    Recent Labs Lab 04/26/15 1354  HGB 12.3*  HCT 40.5  WBC 8.9  PLT 170   Dg Chest Port 1 View  04/30/2015  CLINICAL DATA:  Chest pain EXAM: PORTABLE CHEST 1 VIEW COMPARISON:   04/26/2015 chest radiograph. FINDINGS: Left subclavian MediPort terminates in the upper third of the superior vena cava. Stable cardiomediastinal silhouette with mild cardiomegaly. No pneumothorax. Possible small stable left pleural effusion. Emphysema. Patchy bibasilar lung opacities, slightly worsened at the left lung base. IMPRESSION: 1. Patchy bibasilar lung opacities, slightly worsened at the left lung base, worrisome for pneumonia and/or aspiration. Given the cardiomegaly, a component of pulmonary edema cannot be excluded. 2. Probable small stable left pleural effusion. 3. Emphysema. Electronically Signed   By: Ilona Sorrel M.D.   On: 04/30/2015 00:14    ASSESSMENT / PLAN:  70 year old male with history of ILD and NSCLC who presented with SOB and was noted to have positive troponin.  Patient was admitted by cardiology and some diureses was started.  Patient has steroid dependent COPD and ILD with severe pulmonary HTN and is O2 dependent on 5L at home.  Pt clinically deteriorated and required mor FiO2. Transferred to SDU. On NRBM 70%.   A> acute hypoxemic resp failure 2/2 multifactorial : IPF exacerbation, CHF exacerbation, COPD excaerbation, pulm edema, concern for PNA, demand ischemia  - cont IV steroids for now - Titrate O2 for sat of 88-90%. On NRBM at 70%. Pt did NOT like high flow o2.  - Diuresis as tolerated. - IS per RT protocol. - Flutter valve to assist with airway clearance. - Pt is DNI.  - cont levaquin for now - asp precaution.  - dvt prophylaxis  [address] [place]  04/30/2015, 11:42 AM
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
## 2015-04-30 NOTE — Progress Notes (Signed)
Attempted to start IV NS bolus through port when pt began to again complain of tightness in chest. Paused IV to access Pt's breath sounds, which were clear in upper lobes and diminished in lower lobes. Had pt to cough deeply and was able to relieve the tightness in chest. Attempted to restart IV bolus and after a few brief seconds complained of pain again. Pts vital signs are within normal limits. IV team consulted as well as MD on pt issue. MD ordered xray and IV team came and started PIV. Upon restarting bolus in PIV, pt complained of same pain in chest area and was resolved by stopping infusion and having pt cough deeply. Pt stated that whenever I started bolus it made him feel like he had to urinate and when he feels he has to urinate that is when he gets the pain in his chest. Condom catheter in good condition with no loops and bladder scan revealed bladder to be empty.
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
## 2015-04-30 NOTE — NC FL2 (Signed)
[place] LEVEL OF CARE SCREENING TOOL     IDENTIFICATION  Patient Name: [name] Birthdate: 19-Oct-1945 Sex: male Admission Date (Current Location): 04/18/2015  [facility] and [place] Number:  Herbalist and Address:  The . [facility], [place] [address], [place], [place] [address]      Provider Number: [id number]  Attending Physician Name and Address:  [name], MD  Relative Name and Phone Number:   Stasia Cavalier [phone])    Current Level of Care: Hospital Recommended Level of Care: [place] Prior Approval Number:    Date Approved/Denied:   PASRR Number:   [id number]   Discharge Plan: SNF    Current Diagnoses: Patient Active Problem List   Diagnosis Date Noted  . CHF exacerbation ([place]) 04/13/2015  . Acute on chronic congestive heart failure with left ventricular diastolic dysfunction ([place]) 04/18/2015  . Acute on chronic respiratory failure with hypoxemia ([place])   . Flu   . HCAP (healthcare-associated pneumonia)   . Pulmonary emphysema ([place]) 02/26/2015  . Encounter for therapeutic drug monitoring 02/26/2015  . Hypokalemia 02/19/2015  . Hypoalbuminemia due to protein-calorie malnutrition ([place]) 02/19/2015  . Chronic renal insufficiency 02/19/2015  . Acute on chronic congestive heart failure ([place])   . SOB (shortness of breath)   . Diastolic congestive heart failure due to valvular disease ([place]) 02/06/2015  . Acute on chronic respiratory failure ([place]) 01/19/2015  . Acute dyspnea 12/30/2014  . Acute diastolic (congestive) heart failure ([place]) 12/30/2014  . Acute-on-chronic kidney injury ([place]) 12/30/2014  . Hypotension 12/30/2014  . Troponin level elevated   . DNR no code (do not resuscitate) 11/05/2014  . Biventricular congestive heart failure ([place]) 04/27/2014  . Acute on chronic left systolic heart failure ([place]) 04/11/2014  . COPD exacerbation ([place]) 12/15/2013  . Acute systolic heart failure  ([place]) 12/15/2013  . Dyspnea 10/31/2013  . COPD (chronic obstructive pulmonary disease) with emphysema ([place]) 01/22/2013  . Chronic respiratory failure ([place]) 07/10/2012  . Chronic diastolic heart failure ([place]) 07/10/2012  . Acute respiratory failure ([place]) 06/28/2012  . AKI (acute kidney injury) ([place]) 06/22/2012  . Dehydration 06/22/2012  . Obesity 06/12/2012  . Restrictive lung disease secondary to obesity 12/22/2011  . Hypertension   . Dyslipidemia   . Glucose intolerance (impaired glucose tolerance)   . Chronic venous insufficiency   . Lung cancer ([place]) 11/24/2011    Orientation RESPIRATION BLADDER Height & Weight     Self, Time, Situation, Place   (Decreased breath sound at bases with occasional bilateral rhonchi noted (venturimask)) Continent Weight: 238 lb 12.1 oz (108.3 kg) Height:  '5\' 9"'$  (175.3 cm)  BEHAVIORAL SYMPTOMS/MOOD NEUROLOGICAL BOWEL NUTRITION STATUS     (Negative for dizziness and headaches.) Continent Diet (regular)  AMBULATORY STATUS COMMUNICATION OF NEEDS Skin   Limited Assist Verbally Normal                       Personal Care Assistance Level of Assistance  Bathing, Dressing, Feeding Bathing Assistance: Limited assistance Feeding assistance: Limited assistance Dressing Assistance: Limited assistance     Functional Limitations Info  Sight, Hearing, Speech Sight Info: Adequate Hearing Info: Adequate Speech Info: Adequate    SPECIAL CARE FACTORS FREQUENCY  PT (By licensed PT), OT (By licensed OT)     PT Frequency: Min 3X/week OT Frequency: Min 2X/week            Contractures      Additional Factors Info  Code  Status, Allergies Code Status Info: partial code Allergies Info: codeine           Current Medications (04/30/2015):  This is the current hospital active medication list Current Facility-Administered Medications  Medication Dose Route Frequency Provider Last Rate Last Dose  . 0.9 %  sodium chloride infusion  250 mL  Intravenous PRN [name], MD      . acetaminophen (TYLENOL) tablet 650 mg  650 mg Oral Q4H PRN [name], MD   650 mg at 04/25/15 2133  . ALPRAZolam Duanne Moron) tablet 1 mg  1 mg Oral TID PRN Flora Lipps, MD   1 mg at 04/30/15 0028  . antiseptic oral rinse (CPC / CETYLPYRIDINIUM CHLORIDE 0.05%) solution 7 mL  7 mL Mouth Rinse BID [name], MD   7 mL at 04/29/15 0936  . arformoterol (BROVANA) nebulizer solution 15 mcg  15 mcg Nebulization BID [place], MD   15 mcg at 04/29/15 1939  . budesonide (PULMICORT) nebulizer solution 0.5 mg  0.5 mg Nebulization BID Jose Shirl Harris, MD   0.5 mg at 04/29/15 1939  . digoxin (LANOXIN) tablet 0.125 mg  0.125 mg Oral Daily [name], MD   0.125 mg at 04/29/15 0936  . DOPamine (INTROPIN) 800 mg in dextrose 5 % 250 mL (3.2 mg/mL) infusion  2.5 mcg/kg/min Intravenous Continuous [name], MD 5.2 mL/hr at 04/28/15 1814 2.5 mcg/kg/min at 04/28/15 1814  . furosemide (LASIX) tablet 80 mg  80 mg Oral BID [name], MD   80 mg at 04/29/15 1653  . heparin injection 5,000 Units  5,000 Units Subcutaneous 3 times per day [name], MD   5,000 Units at 04/30/15 0551  . levalbuterol (XOPENEX) nebulizer solution 1.25 mg  1.25 mg Nebulization QID [name], MD   1.25 mg at 04/29/15 1939  . levofloxacin (LEVAQUIN) tablet 750 mg  750 mg Oral Q24H [name], MD   750 mg at 04/29/15 1431  . metolazone (ZAROXOLYN) tablet 5 mg  5 mg Oral Daily [name], MD   5 mg at 04/29/15 0936  . morphine 2 MG/ML injection 1 mg  1 mg Intravenous Q4H PRN Praveen Mannam, MD   1 mg at 04/29/15 2358  . nitroGLYCERIN (NITROSTAT) SL tablet 0.4 mg  0.4 mg Sublingual Q5 min PRN Praveen Mannam, MD   0.4 mg at 04/30/15 0006  . ondansetron (ZOFRAN) injection 4 mg  4 mg Intravenous Q6H PRN [name], MD      . potassium chloride SA (K-DUR,KLOR-CON) CR tablet 20 mEq  20 mEq Oral Daily [name], MD   20 mEq at 04/29/15 0936  . sodium chloride 0.9 %  bolus 1,000 mL  1,000 mL Intravenous Once Anders Simmonds, MD   1,000 mL at 04/29/15 2201  . sodium chloride flush (NS) 0.9 % injection 3 mL  3 mL Intravenous Q12H [name], MD   3 mL at 04/29/15 2145  . sodium chloride flush (NS) 0.9 % injection 3 mL  3 mL Intravenous PRN [name], MD      . technetium TC 56M diethylenetriame-pentaacetic acid (DTPA) injection 31.1 milli Curie  31.1 milli Curie Intravenous Once PRN [name], MD       Facility-Administered Medications Ordered in Other Encounters  Medication Dose Route Frequency Provider Last Rate Last Dose  . sodium chloride 0.9 % injection 10 mL  10 mL Intracatheter PRN Curt Bears, MD   10 mL at 09/03/12 1534  .  sodium chloride 0.9 % injection 10 mL  10 mL Intracatheter PRN Curt Bears, MD   10 mL at 03/04/15 1321     Discharge Medications: Please see discharge summary for a list of discharge medications.  Relevant Imaging Results:  Relevant Lab Results:   Additional Information SS# [id number]  Benard Halsted, LCSWA
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
## 2015-05-01 LAB — CBC
HCT: 35.1 % — ABNORMAL LOW (ref 39.0–52.0)
Hemoglobin: 10.5 g/dL — ABNORMAL LOW (ref 13.0–17.0)
MCH: 26.9 pg (ref 26.0–34.0)
MCHC: 29.9 g/dL — AB (ref 30.0–36.0)
MCV: 90 fL (ref 78.0–100.0)
PLATELETS: 222 10*3/uL (ref 150–400)
RBC: 3.9 MIL/uL — ABNORMAL LOW (ref 4.22–5.81)
RDW: 20.3 % — AB (ref 11.5–15.5)
WBC: 8.2 10*3/uL (ref 4.0–10.5)

## 2015-05-01 LAB — CULTURE, BLOOD (ROUTINE X 2)
CULTURE: NO GROWTH
Culture: NO GROWTH

## 2015-05-01 LAB — BASIC METABOLIC PANEL
Anion gap: 10 (ref 5–15)
BUN: 110 mg/dL — AB (ref 6–20)
CO2: 34 mmol/L — ABNORMAL HIGH (ref 22–32)
CREATININE: 2.42 mg/dL — AB (ref 0.61–1.24)
Calcium: 8.5 mg/dL — ABNORMAL LOW (ref 8.9–10.3)
Chloride: 89 mmol/L — ABNORMAL LOW (ref 101–111)
GFR calc Af Amer: 30 mL/min — ABNORMAL LOW (ref >60)
GFR, EST NON AFRICAN AMERICAN: 26 mL/min — AB (ref >60)
GLUCOSE: 231 mg/dL — AB (ref 65–99)
Potassium: 4.8 mmol/L (ref 3.5–5.1)
SODIUM: 133 mmol/L — AB (ref 135–145)

## 2015-05-01 MED ORDER — WHITE PETROLATUM GEL
Status: AC
Start: 2015-05-01 — End: 2015-05-01
  Administered 2015-05-01: 0.2
  Filled 2015-05-01: qty 1

## 2015-05-01 MED ORDER — METHYLPREDNISOLONE SODIUM SUCC 125 MG IJ SOLR
60.0000 mg | Freq: Four times a day (QID) | INTRAMUSCULAR | Status: AC
  Administered 2015-05-01 – 2015-05-02 (×3): 60 mg via INTRAVENOUS
  Filled 2015-05-01 (×3): qty 2

## 2015-05-01 NOTE — Progress Notes (Signed)
Name: [name] MRN: [id number] DOB: Oct 05, 1945    ADMISSION DATE:  04/20/2015 CONSULTATION DATE:  04/28/2015  REFERRING MD :  Terrence Dupont  CHIEF COMPLAINT:  Hypoxemic respiratory failure.  HISTORY OF PRESENT ILLNESS:  70 year old male with PMH as below, which includes recurrent non-small cell lung carcinoma status post left lobectomy, on immunotherapy, COPD, interstitial lung disease, severe pulmonary hypertension with pulmonary fibrosis, CHF with preserved systolic function, OSA, stage IV kidney disease, and chronic respiratory failure on home 2 L.     SIGNIFICANT EVENTS  3/2- pt admitted for SOB 3/29 transferred to SDU for resp distress. PCCM consulted. Pt is DNI  STUDIES:   ANTIBIOTICS : Levaquin (3/29) >>  CULTURES : Blood culture 3/27 >> (-)   SUBJECTIVE:    Remains SOB at rest even on NRM  VITAL SIGNS: Temp:  [96.9 F (36.1 C)-97.9 F (36.6 C)] 97 F (36.1 C) (04/01 0814) Pulse Rate:  [87-101] 99 (04/01 0814) Resp:  [11-27] 26 (04/01 0814) BP: (81-113)/(55-85) 93/71 mmHg (04/01 0814) SpO2:  [81 %-97 %] 90 % (04/01 0816) Weight:  [115.7 kg (255 lb 1.2 oz)] 115.7 kg (255 lb 1.2 oz) (03/31 2007)  PHYSICAL EXAMINATION: General:  Chronically ill appearing male, in mild respiratory distress. On NRBM Neuro:  Alert and interactive, moving all ext to command. HEENT:  [place]/AT, PERRL, EOM-I and MMM. Cardiovascular:  RRR, Nl S1/S2, -M/R/G. Lungs:  Diffuse crackles bilaterally Abdomen:  Soft, NT, ND an d+BS. Musculoskeletal:  (+) Gr 2-3 edema and Skin:  Intact.   Recent Labs Lab 04/26/15 1354 04/30/15 0448 05/01/15 0450  NA 136 132* 133*  K 4.3 4.2 4.8  CL 94* 90* 89*  CO2 33* 32 34*  BUN 88* 99* 110*  CREATININE 1.38* 2.11* 2.42*  GLUCOSE 220* 161* 231*    Recent Labs Lab 04/26/15 1354 05/01/15 0450  HGB 12.3* 10.5*  HCT 40.5 35.1*  WBC 8.9 8.2  PLT 170 222   Dg Chest Port 1 View  04/30/2015  CLINICAL DATA:  Chest pain EXAM: PORTABLE CHEST 1  VIEW COMPARISON:  04/26/2015 chest radiograph. FINDINGS: Left subclavian MediPort terminates in the upper third of the superior vena cava. Stable cardiomediastinal silhouette with mild cardiomegaly. No pneumothorax. Possible small stable left pleural effusion. Emphysema. Patchy bibasilar lung opacities, slightly worsened at the left lung base. IMPRESSION: 1. Patchy bibasilar lung opacities, slightly worsened at the left lung base, worrisome for pneumonia and/or aspiration. Given the cardiomegaly, a component of pulmonary edema cannot be excluded. 2. Probable small stable left pleural effusion. 3. Emphysema. Electronically Signed   By: Ilona Sorrel M.D.   On: 04/30/2015 00:14    ASSESSMENT / PLAN:  70 year old male with history of ILD and NSCLC who presented with SOB and was noted to have positive troponin.  Patient was admitted by cardiology and some diureses was started.  Patient has steroid dependent COPD and ILD with severe pulmonary HTN and is O2 dependent on 5L at home.  Pt clinically deteriorated and required mor FiO2. Transferred to SDU. On NRBM    A> acute hypoxemic resp failure 2/2 multifactorial : IPF exacerbation, CHF exacerbation, COPD excaerbation, pulm edema, concern for PNA, demand ischemia  - cont IV steroids for now - Titrate O2 for sat of 88-90%. On NRBM at 70%. Pt did NOT like high flow o2.  - Diuresis as tolerated. - IS per RT protocol. - Flutter valve to assist with airway clearance. - Pt is DNI.  - cont  levaquin  - asp precaution.  - dvt prophylaxis   Really nothing else to offer here/ agree with DNI status     Christinia Gully, MD Pulmonary and [place] [phone] After 5:30 PM or weekends, call [phone]
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
## 2015-05-01 DEATH — deceased

## 2015-05-02 DIAGNOSIS — L8990 Pressure ulcer of unspecified site, unspecified stage: Secondary | ICD-10-CM | POA: Insufficient documentation

## 2015-05-02 LAB — BASIC METABOLIC PANEL
ANION GAP: 10 (ref 5–15)
BUN: 116 mg/dL — ABNORMAL HIGH (ref 6–20)
CALCIUM: 8.7 mg/dL — AB (ref 8.9–10.3)
CO2: 33 mmol/L — AB (ref 22–32)
Chloride: 88 mmol/L — ABNORMAL LOW (ref 101–111)
Creatinine, Ser: 2.38 mg/dL — ABNORMAL HIGH (ref 0.61–1.24)
GFR, EST AFRICAN AMERICAN: 30 mL/min — AB (ref >60)
GFR, EST NON AFRICAN AMERICAN: 26 mL/min — AB (ref >60)
Glucose, Bld: 254 mg/dL — ABNORMAL HIGH (ref 65–99)
Potassium: 4.8 mmol/L (ref 3.5–5.1)
Sodium: 131 mmol/L — ABNORMAL LOW (ref 135–145)

## 2015-05-02 NOTE — Plan of Care (Signed)
Problem: Activity: Goal: Capacity to carry out activities will improve Outcome: Progressing Discussed the importance of turning in the bed to relieve pressure off his buttock.

## 2015-05-02 NOTE — Plan of Care (Signed)
Problem: Education: Goal: Ability to demonstrate managment of disease process will improve Outcome: Progressing Discussed importance of even while in bed of him turning and elevating his lower extremities
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
## 2015-05-03 MED ORDER — METHYLPREDNISOLONE SODIUM SUCC 125 MG IJ SOLR
60.0000 mg | Freq: Four times a day (QID) | INTRAMUSCULAR | Status: DC
  Administered 2015-05-03: 60 mg via INTRAVENOUS
  Filled 2015-05-03: qty 2

## 2015-05-03 MED ORDER — ALBUTEROL SULFATE (2.5 MG/3ML) 0.083% IN NEBU
2.5000 mg | INHALATION_SOLUTION | RESPIRATORY_TRACT | Status: DC | PRN
  Administered 2015-05-04 – 2015-05-06 (×3): 2.5 mg via RESPIRATORY_TRACT
  Filled 2015-05-03 (×3): qty 3

## 2015-05-03 MED ORDER — FUROSEMIDE 10 MG/ML IJ SOLN
20.0000 mg | Freq: Once | INTRAMUSCULAR | Status: AC
  Administered 2015-05-03: 20 mg via INTRAVENOUS
  Filled 2015-05-03: qty 2

## 2015-05-03 MED ORDER — METHYLPREDNISOLONE SODIUM SUCC 40 MG IJ SOLR
40.0000 mg | Freq: Three times a day (TID) | INTRAMUSCULAR | Status: DC
  Administered 2015-05-03 – 2015-05-04 (×2): 40 mg via INTRAVENOUS
  Filled 2015-05-03 (×2): qty 1

## 2015-05-03 MED ORDER — LEVALBUTEROL HCL 1.25 MG/0.5ML IN NEBU
1.2500 mg | INHALATION_SOLUTION | Freq: Three times a day (TID) | RESPIRATORY_TRACT | Status: DC
  Administered 2015-05-03 (×2): 1.25 mg via RESPIRATORY_TRACT
  Filled 2015-05-03 (×2): qty 0.5

## 2015-05-03 NOTE — Progress Notes (Signed)
PCCM PROGRESS NOTE  Admission Date: 04/01/2015  Subjective: Still very short of breath.  Frustrated with lack of improvement.  Vital Signs: BP 86/62 mmHg  Pulse 101  Temp(Src) 98.3 F (36.8 C) (Oral)  Resp 30  Ht '5\' 9"'$  (1.753 m)  Wt 252 lb 3.3 oz (114.4 kg)  BMI 37.23 kg/m2  SpO2 92%  Intake/output: I/O last 3 completed shifts: In: 1036.8 [P.O.:840; I.V.:196.8] Out: 3975 [Urine:3975]  General: Sitting in chair Neuro: alert, normal strength Cardiac: regular Chest: basilar crackles, no wheeze Abd: soft, non tender Ext: 2+ edema Skin: venous stasis changes   CMP Latest Ref Rng 05/02/2015 05/01/2015 04/30/2015  Glucose 65 - 99 mg/dL 254(H) 231(H) 161(H)  BUN 6 - 20 mg/dL 116(H) 110(H) 99(H)  Creatinine 0.61 - 1.24 mg/dL 2.38(H) 2.42(H) 2.11(H)  Sodium 135 - 145 mmol/L 131(L) 133(L) 132(L)  Potassium 3.5 - 5.1 mmol/L 4.8 4.8 4.2  Chloride 101 - 111 mmol/L 88(L) 89(L) 90(L)  CO2 22 - 32 mmol/L 33(H) 34(H) 32  Calcium 8.9 - 10.3 mg/dL 8.7(L) 8.5(L) 8.4(L)    CBC Latest Ref Rng 05/01/2015 04/26/2015 04/20/2015  WBC 4.0 - 10.5 K/uL 8.2 8.9 4.7  Hemoglobin 13.0 - 17.0 g/dL 10.5(L) 12.3(L) 11.7(L)  Hematocrit 39.0 - 52.0 % 35.1(L) 40.5 37.6(L)  Platelets 150 - 400 K/uL 222 170 243    No results found.  Studies: 11/29/11 PFT >> FEV1 2.41 (76%), FEV1% 83, TLC 4.32 (64%), DLCO 44% 10/23/14 Echo >> EF 55 to 63%, grade 1 diastolic dysfx, mild MR, severe RV dilation, PAS 94 mmHg 03/25/15 CT chest >> bullous emphysema, basilar fibrosis, traction BTX, 4 cm ascending thoracic aorta 04/22/15 VQ scan >> no PE  Significant events: 3/20 Admit 3/29 Persistent hypoxia >> Pulmonary consulted  Discussion: 70 yo male presented with progressive dyspnea and edema 2nd to acute on chronic diastolic CHF and cor pulmonale.  He is followed by Dr. Chase Caller for COPD with bullous emphysema.  He has hx of recurrent NSCLC s/p Lt lower lobectomy and chemo, HTN, diastolic CHF, CKD stage  IV  Assessment/plan:  Acute on chronic hypoxic respiratory failure. Hx of COPD with bullous emphysema with exacerbation. Hx of pulmonary fibrosis. Acute on chronic cor pulmonale >> might be having LV compression from dilated RV. Secondary pulmonary hypertension. - scheduled brovana, pulmicort - add spiriva 4/03 - continue solumedrol - oxygen to keep SpO2 90 to 95% >> intolerant of BiPAP - lasix 20 mg IV x one 4/03   Acute on chronic diastolic CHF. - per cardiology/primary team  Possible community acquired pneumonia >> organism unspecified. - Day 8 of Abx, currently on levaquin - check procalcitonin  Goals of care. - DNI  Updated pt's son at bedside  Chesley Mires, MD [place] 05/03/2015, 3:22 PM Pager:  [phone] After 3pm call: [phone]

## 2015-05-03 NOTE — Progress Notes (Signed)
CSW will continue to follow pt for placement when medically stable  Domenica Reamer, [place] Social Worker [phone]
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
## 2015-05-03 NOTE — Progress Notes (Signed)
Patient is stable on the non-rebreather, and refusing the use of BIPAP. No significant distress noted, RT will continue to monitor.

## 2015-05-03 NOTE — Care Management Important Message (Signed)
Important Message  Patient Details  Name: [name] MRN: [id number] Date of Birth: 09/12/1945   Medicare Important Message Given:  Yes    Nathen May 05/03/2015, 12:10 PM
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
## 2015-05-04 ENCOUNTER — Inpatient Hospital Stay (HOSPITAL_COMMUNITY): Payer: Commercial Managed Care - HMO

## 2015-05-04 LAB — BASIC METABOLIC PANEL
ANION GAP: 12 (ref 5–15)
BUN: 102 mg/dL — AB (ref 6–20)
CALCIUM: 8.9 mg/dL (ref 8.9–10.3)
CO2: 32 mmol/L (ref 22–32)
CREATININE: 1.9 mg/dL — AB (ref 0.61–1.24)
Chloride: 91 mmol/L — ABNORMAL LOW (ref 101–111)
GFR calc Af Amer: 40 mL/min — ABNORMAL LOW (ref >60)
GFR, EST NON AFRICAN AMERICAN: 34 mL/min — AB (ref >60)
GLUCOSE: 184 mg/dL — AB (ref 65–99)
Potassium: 4.7 mmol/L (ref 3.5–5.1)
Sodium: 135 mmol/L (ref 135–145)

## 2015-05-04 LAB — CBC
HCT: 36.7 % — ABNORMAL LOW (ref 39.0–52.0)
Hemoglobin: 10.9 g/dL — ABNORMAL LOW (ref 13.0–17.0)
MCH: 26.9 pg (ref 26.0–34.0)
MCHC: 29.7 g/dL — AB (ref 30.0–36.0)
MCV: 90.6 fL (ref 78.0–100.0)
PLATELETS: 166 10*3/uL (ref 150–400)
RBC: 4.05 MIL/uL — ABNORMAL LOW (ref 4.22–5.81)
RDW: 21.4 % — AB (ref 11.5–15.5)
WBC: 8.5 10*3/uL (ref 4.0–10.5)

## 2015-05-04 LAB — PROCALCITONIN: PROCALCITONIN: 0.18 ng/mL

## 2015-05-04 LAB — BRAIN NATRIURETIC PEPTIDE: B NATRIURETIC PEPTIDE 5: 892.9 pg/mL — AB (ref 0.0–100.0)

## 2015-05-04 MED ORDER — METHYLPREDNISOLONE SODIUM SUCC 40 MG IJ SOLR
40.0000 mg | Freq: Two times a day (BID) | INTRAMUSCULAR | Status: DC
  Administered 2015-05-04 – 2015-05-06 (×5): 40 mg via INTRAVENOUS
  Filled 2015-05-04 (×5): qty 1

## 2015-05-04 MED ORDER — TIOTROPIUM BROMIDE MONOHYDRATE 18 MCG IN CAPS
18.0000 ug | ORAL_CAPSULE | Freq: Every day | RESPIRATORY_TRACT | Status: DC
  Administered 2015-05-04 – 2015-05-06 (×2): 18 ug via RESPIRATORY_TRACT
  Filled 2015-05-04: qty 5

## 2015-05-04 MED ORDER — FUROSEMIDE 10 MG/ML IJ SOLN
40.0000 mg | Freq: Once | INTRAMUSCULAR | Status: AC
  Administered 2015-05-04: 40 mg via INTRAVENOUS
  Filled 2015-05-04: qty 4

## 2015-05-04 MED ORDER — FUROSEMIDE 10 MG/ML IJ SOLN
40.0000 mg | Freq: Every day | INTRAMUSCULAR | Status: DC
  Administered 2015-05-04 – 2015-05-05 (×2): 40 mg via INTRAVENOUS
  Filled 2015-05-04 (×2): qty 4

## 2015-05-04 NOTE — Progress Notes (Signed)
Patient continuing to refuse BiPAP, now on nonrebreather with a nasal cannula of 5L as well. RT will continue to monitor.

## 2015-05-04 NOTE — Progress Notes (Signed)
OT Cancellation Note  Patient Details Name: [name] MRN: [id number] DOB: 02/07/1945   Cancelled Treatment:    Reason Eval/Treat Not Completed: Patient not medically ready (78% mask) Recommend Palliative consult. Pt unable to tolerate therapy due to desaturation and very fatigue which could be secondary to respiratory status.  Vonita Moss   OTR/L Pager: [phone] Office: [phone] .  05/04/2015, 2:12 PM

## 2015-05-04 NOTE — Progress Notes (Signed)
Placed patient on high flow nasal cannula with verbal order from Dr. Marigene Ehlers to RN. Patient is tolerating it fairly well, not very accepting of RT coaching breathing techniques. SpO2 has come up to 92%. RT will continue to monitor.

## 2015-05-04 NOTE — Progress Notes (Signed)
BiPAP placed on patient due to increased respiratory distress and decreased sats. Patient is tolerating well and RT will continue to monitor.

## 2015-05-04 NOTE — Progress Notes (Signed)
Post neb treatment.
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
## 2015-05-04 NOTE — Progress Notes (Signed)
Patient unable to tolerate BiPAP any longer. RT will continue to monitor.

## 2015-05-04 NOTE — Progress Notes (Signed)
PCCM PROGRESS NOTE  Admission Date: 04/24/2015  Subjective: Still very short of breath.  Not able to tolerate High flow oxygen.  Vital Signs: BP 93/76 mmHg  Pulse 82  Temp(Src) 97.4 F (36.3 C) (Axillary)  Resp 17  Ht '5\' 9"'$  (1.753 m)  Wt 251 lb 15.8 oz (114.3 kg)  BMI 37.19 kg/m2  SpO2 89%  Intake/output: I/O last 3 completed shifts: In: 763.8 [P.O.:600; I.V.:163.8] Out: 6789 [Urine:3375]  General: Sitting in bed Neuro: alert, normal strength Cardiac: regular Chest: basilar crackles Lt > Rt, no wheeze Abd: soft, non tender Ext: 2+ edema Skin: venous stasis changes   CMP Latest Ref Rng 05/04/2015 05/02/2015 05/01/2015  Glucose 65 - 99 mg/dL 184(H) 254(H) 231(H)  BUN 6 - 20 mg/dL 102(H) 116(H) 110(H)  Creatinine 0.61 - 1.24 mg/dL 1.90(H) 2.38(H) 2.42(H)  Sodium 135 - 145 mmol/L 135 131(L) 133(L)  Potassium 3.5 - 5.1 mmol/L 4.7 4.8 4.8  Chloride 101 - 111 mmol/L 91(L) 88(L) 89(L)  CO2 22 - 32 mmol/L 32 33(H) 34(H)  Calcium 8.9 - 10.3 mg/dL 8.9 8.7(L) 8.5(L)    CBC Latest Ref Rng 05/04/2015 05/01/2015 04/26/2015  WBC 4.0 - 10.5 K/uL 8.5 8.2 8.9  Hemoglobin 13.0 - 17.0 g/dL 10.9(L) 10.5(L) 12.3(L)  Hematocrit 39.0 - 52.0 % 36.7(L) 35.1(L) 40.5  Platelets 150 - 400 K/uL 166 222 170    Dg Chest Port 1 View  05/04/2015  CLINICAL DATA:  Respiratory failure EXAM: PORTABLE CHEST 1 VIEW COMPARISON:  04/29/2015 FINDINGS: There is a left chest wall port a catheter with tip in the projection of the SVC. Mild cardiac enlargement. Bilateral pleural effusions are noted left greater than right. Airspace consolidation within the left lower lobe is identified. This is unchanged from previous exam. IMPRESSION: 1. Persistent left lower lobe pneumonia. 2. Bilateral pleural effusions. Electronically Signed   By: Kerby Moors M.D.   On: 05/04/2015 07:39    Studies: 11/29/11 PFT >> FEV1 2.41 (76%), FEV1% 83, TLC 4.32 (64%), DLCO 44% 10/23/14 Echo >> EF 55 to 38%, grade 1 diastolic dysfx, mild MR,  severe RV dilation, PAS 94 mmHg 03/25/15 CT chest >> bullous emphysema, basilar fibrosis, traction BTX, 4 cm ascending thoracic aorta 04/22/15 VQ scan >> no PE  Significant events: 3/20 Admit 3/29 Persistent hypoxia >> Pulmonary consulted  Discussion: 70 yo male presented with progressive dyspnea and edema 2nd to acute on chronic diastolic CHF and cor pulmonale.  He is followed by Dr. Chase Caller for COPD with bullous emphysema, and chronic hypoxic respiratory failure on 5 liters oxygen at home.  He has hx of recurrent NSCLC s/p Lt lower lobectomy and chemo, HTN, diastolic CHF, CKD stage IV  Assessment/plan:  Acute on chronic hypoxic respiratory failure. Hx of COPD with bullous emphysema with exacerbation. Hx of pulmonary fibrosis. Acute on chronic cor pulmonale >> might be having LV compression from dilated RV. Secondary pulmonary hypertension. - scheduled brovana, pulmicort, spiriva - wean off solumedrol as tolerated - oxygen to keep SpO2 88 to 95% >> intolerant of BiPAP, high flow oxygen - continue lasix  Acute on chronic diastolic CHF. - per cardiology/primary team  Possible community acquired pneumonia >> organism unspecified. - Day 9/14 of Abx, currently on levaquin  Goals of care. - DNI - might benefit from palliative care discussion  Updated pt's son at bedside.  Will ask case management to assess whether he is candidate for LTAC.  Chesley Mires, MD [place] 05/04/2015, 11:13 AM Pager:  [phone] After 3pm call:  [phone]  

## 2015-05-04 NOTE — Progress Notes (Signed)
Removed patient from high flow nasal cannula at his request due to him "not being able to tolerate it." Placed patient back on non-rebreather and SpO2 remained at 88%. RT will continue to monitor.

## 2015-05-05 LAB — BASIC METABOLIC PANEL
Anion gap: 15 (ref 5–15)
BUN: 104 mg/dL — AB (ref 6–20)
CHLORIDE: 91 mmol/L — AB (ref 101–111)
CO2: 29 mmol/L (ref 22–32)
CREATININE: 1.85 mg/dL — AB (ref 0.61–1.24)
Calcium: 8.7 mg/dL — ABNORMAL LOW (ref 8.9–10.3)
GFR calc Af Amer: 41 mL/min — ABNORMAL LOW (ref >60)
GFR calc non Af Amer: 36 mL/min — ABNORMAL LOW (ref >60)
Glucose, Bld: 162 mg/dL — ABNORMAL HIGH (ref 65–99)
Potassium: 4.3 mmol/L (ref 3.5–5.1)
SODIUM: 135 mmol/L (ref 135–145)

## 2015-05-05 LAB — PROCALCITONIN: Procalcitonin: 0.2 ng/mL

## 2015-05-05 MED ORDER — FUROSEMIDE 10 MG/ML IJ SOLN
40.0000 mg | Freq: Two times a day (BID) | INTRAMUSCULAR | Status: DC
  Administered 2015-05-05 – 2015-05-06 (×3): 40 mg via INTRAVENOUS
  Filled 2015-05-05 (×3): qty 4

## 2015-05-05 MED ORDER — IPRATROPIUM-ALBUTEROL 0.5-2.5 (3) MG/3ML IN SOLN
3.0000 mL | Freq: Four times a day (QID) | RESPIRATORY_TRACT | Status: DC
  Administered 2015-05-05 (×3): 3 mL via RESPIRATORY_TRACT
  Filled 2015-05-05 (×3): qty 3

## 2015-05-05 MED ORDER — MORPHINE SULFATE (PF) 2 MG/ML IV SOLN
1.0000 mg | INTRAVENOUS | Status: DC | PRN
  Administered 2015-05-05: 2 mg via INTRAVENOUS
  Filled 2015-05-05: qty 1

## 2015-05-05 NOTE — Progress Notes (Signed)
OT Cancellation Note  Patient Details Name: [name] MRN: [id number] DOB: 02-14-1945   Cancelled Treatment:    Reason Eval/Treat Not Completed: Patient declined, no reason specified - first attempt with respiratory therapy and reports "not good. No" and now patient with break fast and verbalized does not want to work this morning. Ot to continue to check on patient. Pt with respiratory saturations 75%-90% on mask while OT in room talking with patient. Pt with eyes closed and focused attention    Vonita Moss   OTR/L Pager: [phone] Office: [phone] .  05/05/2015, 9:05 AM

## 2015-05-05 NOTE — Progress Notes (Signed)
PCCM PROGRESS NOTE  Admission Date: 04/17/2015  Subjective: He is miserable.  Feels like he is struggling to breath, and not getting any improvement.  Remains on NRB and nasal cannula, and SpO2 only in mid 80s at rest.  Vital Signs: BP 98/66 mmHg  Pulse 93  Temp(Src) 97.6 F (36.4 C) (Axillary)  Resp 28  Ht '5\' 9"'$  (1.753 m)  Wt 229 lb 8 oz (104.1 kg)  BMI 33.88 kg/m2  SpO2 87%  Intake/output: I/O last 3 completed shifts: In: 963 [P.O.:960; I.V.:3] Out: 3450 [Urine:3450]  General: Sitting in bed Neuro: alert, normal strength Cardiac: regular Chest: basilar crackles Lt > Rt, no wheeze Abd: soft, non tender Ext: 2+ edema Skin: venous stasis changes   CMP Latest Ref Rng 05/05/2015 05/04/2015 05/02/2015  Glucose 65 - 99 mg/dL 162(H) 184(H) 254(H)  BUN 6 - 20 mg/dL 104(H) 102(H) 116(H)  Creatinine 0.61 - 1.24 mg/dL 1.85(H) 1.90(H) 2.38(H)  Sodium 135 - 145 mmol/L 135 135 131(L)  Potassium 3.5 - 5.1 mmol/L 4.3 4.7 4.8  Chloride 101 - 111 mmol/L 91(L) 91(L) 88(L)  CO2 22 - 32 mmol/L 29 32 33(H)  Calcium 8.9 - 10.3 mg/dL 8.7(L) 8.9 8.7(L)    CBC Latest Ref Rng 05/04/2015 05/01/2015 04/26/2015  WBC 4.0 - 10.5 K/uL 8.5 8.2 8.9  Hemoglobin 13.0 - 17.0 g/dL 10.9(L) 10.5(L) 12.3(L)  Hematocrit 39.0 - 52.0 % 36.7(L) 35.1(L) 40.5  Platelets 150 - 400 K/uL 166 222 170    Dg Chest Port 1 View  05/04/2015  CLINICAL DATA:  Respiratory failure EXAM: PORTABLE CHEST 1 VIEW COMPARISON:  04/29/2015 FINDINGS: There is a left chest wall port a catheter with tip in the projection of the SVC. Mild cardiac enlargement. Bilateral pleural effusions are noted left greater than right. Airspace consolidation within the left lower lobe is identified. This is unchanged from previous exam. IMPRESSION: 1. Persistent left lower lobe pneumonia. 2. Bilateral pleural effusions. Electronically Signed   By: Kerby Moors M.D.   On: 05/04/2015 07:39    Studies: 11/29/11 PFT >> FEV1 2.41 (76%), FEV1% 83, TLC 4.32 (64%),  DLCO 44% 10/23/14 Echo >> EF 55 to 16%, grade 1 diastolic dysfx, mild MR, severe RV dilation, PAS 94 mmHg 03/25/15 CT chest >> bullous emphysema, basilar fibrosis, traction BTX, 4 cm ascending thoracic aorta 04/22/15 VQ scan >> no PE  Significant events: 3/20 Admit 3/29 Persistent hypoxia >> Pulmonary consulted  Discussion: 70 yo male presented with progressive dyspnea and edema 2nd to acute on chronic diastolic CHF and cor pulmonale.  He is followed by Dr. Chase Caller for COPD with bullous emphysema, and chronic hypoxic respiratory failure on 5 liters oxygen at home.  He has hx of recurrent NSCLC s/p Lt lower lobectomy and chemo, HTN, diastolic CHF, CKD stage IV  Assessment/plan:  Acute on chronic hypoxic respiratory failure. Hx of COPD with bullous emphysema with exacerbation. Hx of pulmonary fibrosis. Acute on chronic cor pulmonale >> might be having LV compression from dilated RV. Secondary pulmonary hypertension. - scheduled brovana, pulmicort, spiriva - continue solumedrol - oxygen to keep SpO2 88 to 95% >> intolerant of BiPAP, high flow oxygen - continue lasix - prn morphine for dyspnea  Acute on chronic diastolic CHF. - per cardiology/primary team  Possible community acquired pneumonia >> organism unspecified. - Day 10/14 of Abx, currently on levaquin  Goals of care. - DNI  Summary: Had extensive d/w Mr. Kotter.  Explained that he is not showing improvement with his oxygen needs in spite of  aggressive therapies.  I think much of this is related to his chronic medical conditions.  Explained that he might not be able to improve.  He does not feel this is an acceptable quality of life, and does not want to suffer like this any more.  Will ask palliative care team to discuss further with him and his family.   Chesley Mires, MD [facility] Pulmonary/Critical Care 05/05/2015, 11:19 AM Pager:  [phone] After 3pm call: [phone]

## 2015-05-05 NOTE — Progress Notes (Signed)
PPT Cancellation Note  Patient Details Name: [name] MRN: [id number] DOB: [birth year]   Cancelled Treatment:    Reason Eval/Treat Not Completed: Fatigue/lethargy limiting ability to participate; patient on 100% NRB and dyspneic with SpO2 86%.  Reports feeling too SOB to participate in bed level activity or OOB.  Patient requesting bi-pap and RN informed.  Will attempt to see another day.   Reginia Naas 05/05/2015, 11:20 AM Magda Kiel, PT [phone] 05/05/2015
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
## 2015-05-05 NOTE — Progress Notes (Signed)
OT NOTE  Please order air mattress overlay for patient due to pain at sacrum without relief with pillows. Pt prolonged bed rest due to oxygen levels on non rebreather 5 L limiting mobility    Jeri Modena   OTR/L Pager: [phone] Office: [phone] .

## 2015-05-05 NOTE — Progress Notes (Signed)
Pt requested to go on bipap machine due to feeling tired and WOB.  Pt is on NIV/PS  10/5 and 100%.  Pt tolerating well.  O2 sats 93%.  RT will continue to monitor.

## 2015-05-05 NOTE — Progress Notes (Signed)
Pt currently on 100% NRB and 6 L [place]. Sats are 89%. Pt does bot want to wear BIPAP at this time. No distress noted. Will cont to monitor

## 2015-05-06 ENCOUNTER — Ambulatory Visit: Payer: Commercial Managed Care - HMO | Admitting: Internal Medicine

## 2015-05-06 DIAGNOSIS — Z66 Do not resuscitate: Secondary | ICD-10-CM | POA: Insufficient documentation

## 2015-05-06 DIAGNOSIS — Z515 Encounter for palliative care: Secondary | ICD-10-CM | POA: Insufficient documentation

## 2015-05-06 DIAGNOSIS — K5901 Slow transit constipation: Secondary | ICD-10-CM

## 2015-05-06 DIAGNOSIS — F411 Generalized anxiety disorder: Secondary | ICD-10-CM | POA: Insufficient documentation

## 2015-05-06 DIAGNOSIS — R0600 Dyspnea, unspecified: Secondary | ICD-10-CM

## 2015-05-06 MED ORDER — FUROSEMIDE 10 MG/ML IJ SOLN
60.0000 mg | Freq: Once | INTRAMUSCULAR | Status: AC
  Administered 2015-05-06: 60 mg via INTRAVENOUS
  Filled 2015-05-06: qty 6

## 2015-05-06 MED ORDER — IPRATROPIUM-ALBUTEROL 0.5-2.5 (3) MG/3ML IN SOLN
3.0000 mL | Freq: Four times a day (QID) | RESPIRATORY_TRACT | Status: DC
  Administered 2015-05-06 (×4): 3 mL via RESPIRATORY_TRACT
  Filled 2015-05-06 (×4): qty 3

## 2015-05-06 MED ORDER — MORPHINE SULFATE (PF) 2 MG/ML IV SOLN
1.0000 mg | INTRAVENOUS | Status: DC | PRN
  Administered 2015-05-06 (×2): 2 mg via INTRAVENOUS
  Filled 2015-05-06 (×2): qty 1

## 2015-05-06 MED ORDER — LORAZEPAM 2 MG/ML IJ SOLN
1.0000 mg | Freq: Four times a day (QID) | INTRAMUSCULAR | Status: DC | PRN
  Administered 2015-05-07: 1 mg via INTRAVENOUS
  Filled 2015-05-06: qty 1

## 2015-05-06 MED ORDER — LORAZEPAM 2 MG/ML IJ SOLN
0.5000 mg | Freq: Four times a day (QID) | INTRAMUSCULAR | Status: DC | PRN
  Administered 2015-05-06: 0.5 mg via INTRAVENOUS
  Filled 2015-05-06: qty 1

## 2015-05-06 MED ORDER — MAGNESIUM HYDROXIDE 400 MG/5ML PO SUSP
30.0000 mL | Freq: Once | ORAL | Status: AC
  Administered 2015-05-06: 30 mL via ORAL
  Filled 2015-05-06: qty 30

## 2015-05-06 NOTE — Progress Notes (Signed)
Patient desaturated to the low 70s-79% on NRB with 8L neb treatment going through. Patient refused to go on BiPAP at this time. RT will continue to monitor patient.

## 2015-05-06 NOTE — Progress Notes (Signed)
RT NOTE:  Pt placed on BIPAP (NIV PS) for respiratory distress. Pt SOB, WOB increased, Sats 75%.  Pt tolerating BIPAP well at this time. Pt agrees BIPAP makes it easier to breathe. Pt should remain on BIPAP tonight.

## 2015-05-06 NOTE — Progress Notes (Addendum)
PCCM PROGRESS NOTE  Admission Date: 04/20/2015  Subjective: Sat's in 70s and 80s on NRB and nasal cannula.  Tried BiPAP again yesterday >> couldn't go more than 2 hrs and.  Vital Signs: BP 85/63 mmHg  Pulse 89  Temp(Src) 97.5 F (36.4 C) (Axillary)  Resp 18  Ht '5\' 9"'$  (1.753 m)  Wt 249 lb 12.5 oz (113.3 kg)  BMI 36.87 kg/m2  SpO2 83%  Intake/output: I/O last 3 completed shifts: In: 723 [P.O.:720; I.V.:3] Out: 1775 [Urine:1775]  General: Sitting in bed Neuro: alert, normal strength Cardiac: regular Chest: basilar crackles Lt > Rt, no wheeze Abd: soft, non tender Ext: 2+ edema Skin: venous stasis changes Psych: flat affect and in an   CMP Latest Ref Rng 05/05/2015 05/04/2015 05/02/2015  Glucose 65 - 99 mg/dL 162(H) 184(H) 254(H)  BUN 6 - 20 mg/dL 104(H) 102(H) 116(H)  Creatinine 0.61 - 1.24 mg/dL 1.85(H) 1.90(H) 2.38(H)  Sodium 135 - 145 mmol/L 135 135 131(L)  Potassium 3.5 - 5.1 mmol/L 4.3 4.7 4.8  Chloride 101 - 111 mmol/L 91(L) 91(L) 88(L)  CO2 22 - 32 mmol/L 29 32 33(H)  Calcium 8.9 - 10.3 mg/dL 8.7(L) 8.9 8.7(L)    CBC Latest Ref Rng 05/04/2015 05/01/2015 04/26/2015  WBC 4.0 - 10.5 K/uL 8.5 8.2 8.9  Hemoglobin 13.0 - 17.0 g/dL 10.9(L) 10.5(L) 12.3(L)  Hematocrit 39.0 - 52.0 % 36.7(L) 35.1(L) 40.5  Platelets 150 - 400 K/uL 166 222 170    No results found.  Studies: 11/29/11 PFT >> FEV1 2.41 (76%), FEV1% 83, TLC 4.32 (64%), DLCO 44% 10/23/14 Echo >> EF 55 to 69%, grade 1 diastolic dysfx, mild MR, severe RV dilation, PAS 94 mmHg 03/25/15 CT chest >> bullous emphysema, basilar fibrosis, traction BTX, 4 cm ascending thoracic aorta 04/22/15 VQ scan >> no PE  Significant events: 3/20 Admit 3/29 Persistent hypoxia >> Pulmonary consulted 4/06 Palliative care consulted  Discussion: 70 yo male presented with progressive dyspnea and edema 2nd to acute on chronic diastolic CHF and cor pulmonale.  He is followed by Dr. Chase Caller for COPD with bullous emphysema, and chronic  hypoxic respiratory failure on 5 liters oxygen at home.  He has hx of recurrent NSCLC s/p Lt lower lobectomy and chemo, HTN, diastolic CHF, CKD stage IV  Assessment/plan:  Acute on chronic hypoxic respiratory failure >> not able to keep SpO2 up. Hx of COPD with bullous emphysema with exacerbation. Hx of pulmonary fibrosis. Acute on chronic cor pulmonale >> might be having LV compression from dilated RV. Secondary pulmonary hypertension. - scheduled brovana, pulmicort, spiriva - continue solumedrol - continue supplemental oxygen with NRB and nasal cannula >> intolerant of BiPAP, high flow oxygen - continue lasix - prn morphine for dyspnea  Acute on chronic diastolic CHF. - per cardiology/primary team  Possible community acquired pneumonia >> organism unspecified. - Day 11/14 of Abx, currently on levaquin  Goals of care. - DNI  Summary: Updated pt's son.  Explained that [name] has not responded to therapies, and no additional therapies to offer.  Explained that it might be best at this time to shift focus of care to comfort measures.  Palliative care has been consulted.   [name] Mires, MD [facility] Pulmonary/Critical Care 05/06/2015, 11:35 AM Pager:  [phone] After 3pm call: [phone]
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
## 2015-05-06 NOTE — Progress Notes (Signed)
PT Cancellation Note  Patient Details Name: MAZE CORNIEL MRN: [id number] DOB: [birth year]   Cancelled Treatment:    Reason Eval/Treat Not Completed: Medical issues which prohibited therapy ( medical instability-sats in 70's w/nonrebreather).  Will sign off as pt has been unable to work with PT for the last week due to sats in 70's-80's on nonrebreather mask.  Pt now with palliative care consult.  Nursing can get pt up as he tolerates.  At this time, pt cannot tolerate PT.  Sign off.  Please reorder if pt status changes.  Thanks.     Irwin Brakeman F 05/06/2015, 9:28 AM [facility] [phone] (pager)

## 2015-05-06 NOTE — Progress Notes (Signed)
Pt on 100% NRB mask and 6 L [place]. Sats maintaining at 84-85%, which is below goal. RT convinced patient to wear BIPAP for couple of hours as he did yesterday. Will continue to monitor

## 2015-05-06 NOTE — Progress Notes (Signed)
RT NOTE:  RT called to put patient back on BIPAP. I explained I left patient on BIPAP earlier and that he should have not been switched back to NRB without RT being aware.  Upon arrival patient SOB, WOB increased, RR mid 40's, Sats 75%. RN had put BIPAP on patient and was pulling Morphine. RT will continue to assess throughout night. RN and PT aware that BIPAP needs to remain on over night. RN aware to contact RT if patient wants to take a break.

## 2015-05-06 NOTE — Care Management Important Message (Signed)
Important Message  Patient Details  Name: [name] MRN: [id number] Date of Birth: Dec 05, 1945   Medicare Important Message Given:  Yes    Nathen May 05/06/2015, 11:14 AM
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
## 2015-05-06 NOTE — Progress Notes (Signed)
Pt taken off BIPAP and placed on 100% NRB and 6 L [place] per request. Sats 83-84%. Pt refusing to o back on BIPAP. Will cont to monitor.

## 2015-05-06 NOTE — Progress Notes (Signed)
No charge note.  I met with the patient briefly unfortunately it was very difficult for him to speak as he was on Bipap.  I introduced myself and because he looked anxious I recommended prn ativan.  He was agreeable.  I called his son, Louie Casa, who will met with me at bedside today at 2:30.  Imogene Burn, [place] [facility] Pager: [phone]

## 2015-05-10 ENCOUNTER — Telehealth: Payer: Self-pay | Admitting: Internal Medicine

## 2015-05-10 DIAGNOSIS — C3410 Malignant neoplasm of upper lobe, unspecified bronchus or lung: Secondary | ICD-10-CM

## 2015-05-10 NOTE — Telephone Encounter (Signed)
lmtcb x1 for [place].

## 2015-05-11 NOTE — Telephone Encounter (Signed)
Spoke with Louie Casa. Advised him that we would place an order for this. Order has been placed. Nothing further was needed.

## 2015-05-12 ENCOUNTER — Ambulatory Visit: Payer: Commercial Managed Care - HMO | Admitting: Adult Health

## 2015-05-31 NOTE — Progress Notes (Signed)
OT Cancellation Note  Patient Details Name: [name] MRN: [id number] DOB: September 26, 1945   Cancelled Treatment:    Reason Eval/Treat Not Completed: Patient not medically ready;Medical issues which prohibited therapy pt with rapid response called due to agonal breathing with decr arousal/ response to painful stimuli and pulse ox not reading. Ot to sign off acutely. Please reorder if appropriate.   Vonita Moss   OTR/L Pager: [phone] Office: [phone] .  05-24-15, 7:18 AM

## 2015-05-31 NOTE — Progress Notes (Signed)
Pt using accessory muscles to breath throughout shift. Placed on BiPAP by RT and tolerating BiPAP after ativan given. At 0340, BiPAP beeping, RN went to assess. Pt continued accessory muscle breathing and BiPAP mask fixed to stop beeping. Pt then began a 20 second period of apnea and began agonal breathing approximately every 20 seconds. Pt unresponsive to pain/sternal rub. Brien Mates, RN arrived at bedside to assist this RN. Cardiology and PCCM notified of change. Dr. Jimmy Footman stated that since the pt is a DNR, no further actions would be taken. RT and rapid response also notified and came to bedside to assist. RT removed BiPAP and replaced with NRB, rapid response stated the patient has begun the dying process. Son [name] and daughter [name] notified and on their way. Pt in PEA in agonal rhythm. Time of death called 0352, lack of breath sounds in all fields and lack of apical pulse found by this RN and Brien Mates, RN. Son [name] arrived at [phone].

## 2015-05-31 NOTE — Progress Notes (Signed)
RT NOTE:  RN called stating she was in the room when the BIPAP began alarming "No Patient Effort". Pt was unresponsive and taking agonal breaths every 20 seconds, BP dropping. Upon arrival BIPAP removed, NRB placed on pt. Pt unresponsive to voice or painful stimuli, agonal breathing, pulse ox not reading.  Pt DNR/DNI. RN made MD aware and Rapid Response @ bedside. Family contacted. BIPAP pulled from room. TOD [id number]
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
## 2015-05-31 DEATH — deceased

## 2015-07-14 ENCOUNTER — Other Ambulatory Visit: Payer: Commercial Managed Care - HMO

## 2015-07-16 ENCOUNTER — Other Ambulatory Visit: Payer: Commercial Managed Care - HMO

## 2015-07-19 ENCOUNTER — Other Ambulatory Visit: Payer: Self-pay | Admitting: Nurse Practitioner

## 2015-07-20 ENCOUNTER — Other Ambulatory Visit: Payer: Self-pay | Admitting: Nurse Practitioner

## 2015-07-22 ENCOUNTER — Ambulatory Visit: Payer: Commercial Managed Care - HMO | Admitting: Internal Medicine

## 2015-08-08 ENCOUNTER — Encounter: Payer: Self-pay | Admitting: *Deleted

## 2016-09-19 IMAGING — CT CT ANGIO CHEST
2 of 10 series · 17 of 46 positions shown · IV contrast (omnipaque)
Comparison: Chest radiograph March 25, 2015; chest CT Ivar Omar

CLINICAL DATA: Progressive shortness of breath. History of lung
carcinoma

EXAM:
CT ANGIOGRAPHY CHEST WITH CONTRAST
TECHNIQUE: Multidetector CT imaging of the chest was performed using the
standard protocol during bolus administration of intravenous
contrast. Multiplanar CT image reconstructions and MIPs were
obtained to evaluate the vascular anatomy.
CONTRAST:  80mL OMNIPAQUE IOHEXOL 350 MG/ML SOLN

[Series 7: coronal mpr · coronal · 0.61mm/px · 3 of 138 slices shown]
[im 35/138  soft-tissue]
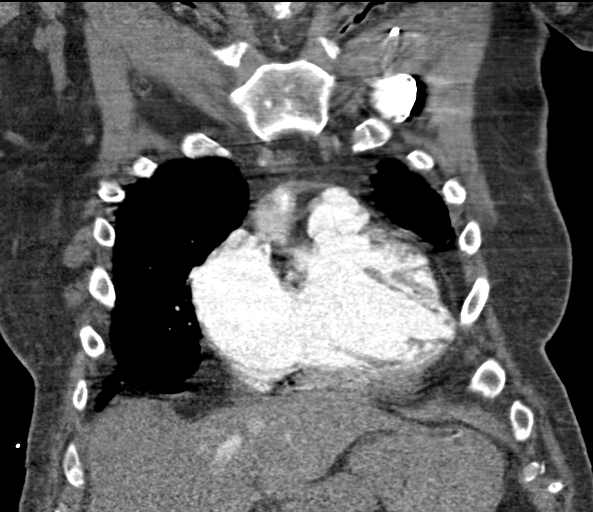
[im 69/138  soft-tissue]
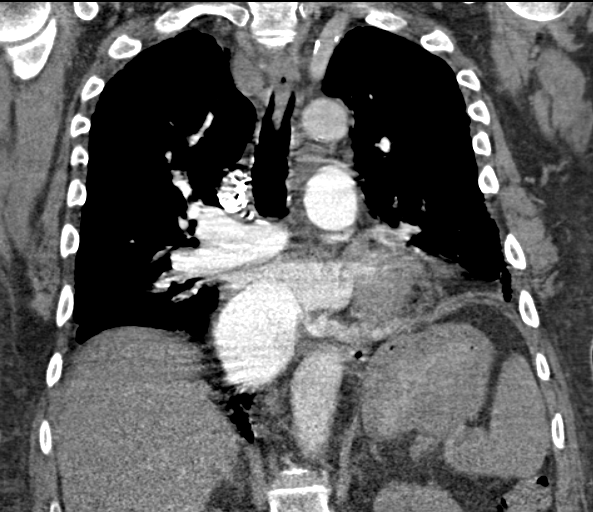
[im 103/138  soft-tissue]
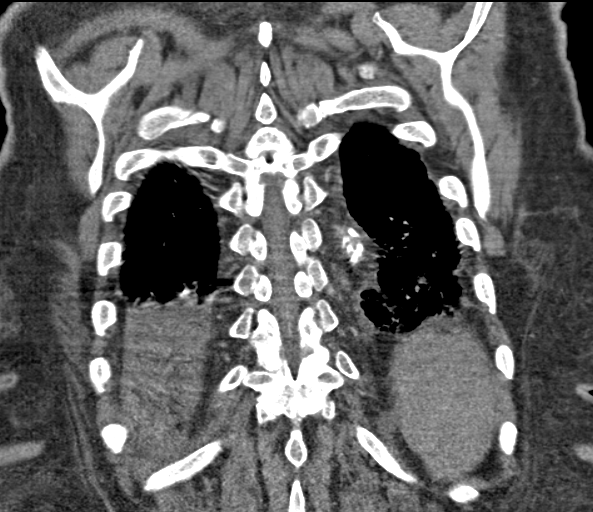

[Series 11: thins · axial · 0.73mm/px · z∈[+1210,+1485]mm · 14 of 311 slices shown]
[im 18/311  lung]
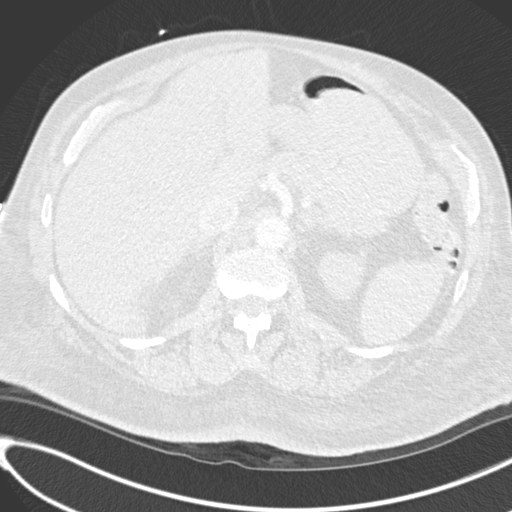
[im 35/311  soft-tissue]
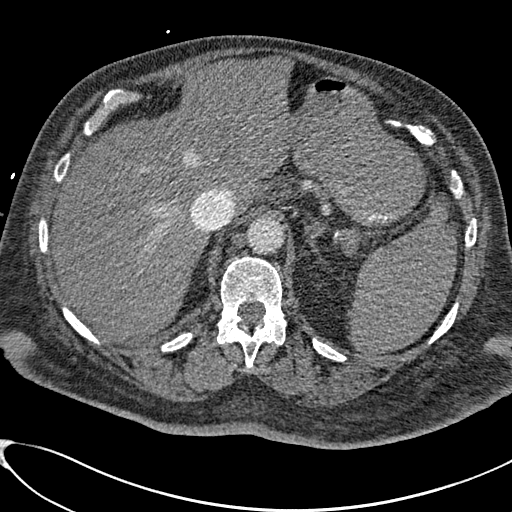
[im 69/311  lung]
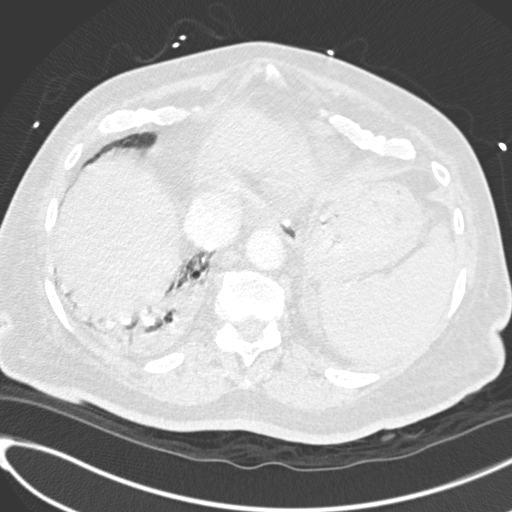
[im 87/311  soft-tissue]
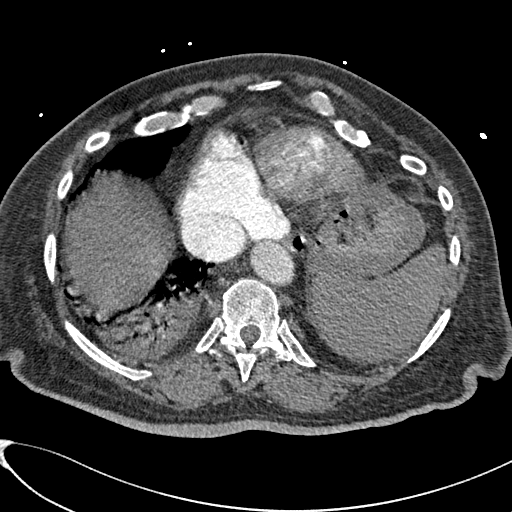
[im 104/311  lung]
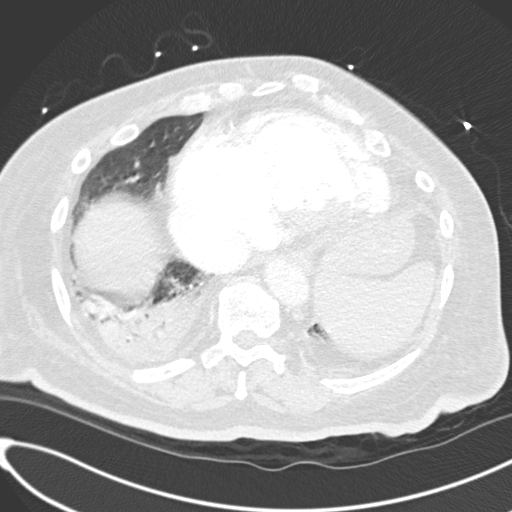
[im 121/311  soft-tissue]
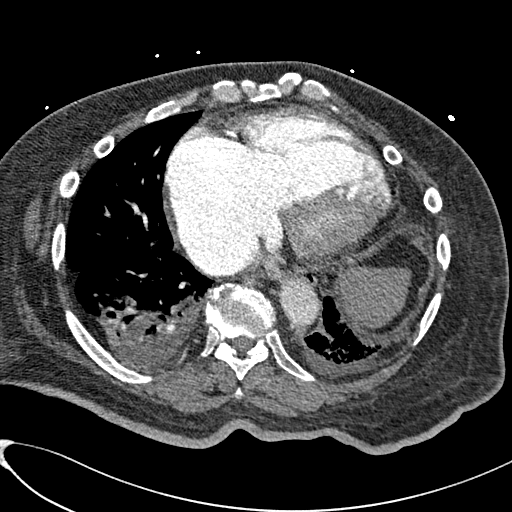
[im 138/311  lung]
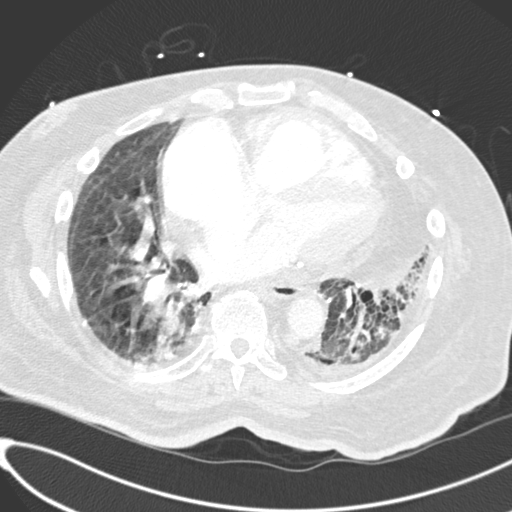
[im 173/311  soft-tissue]
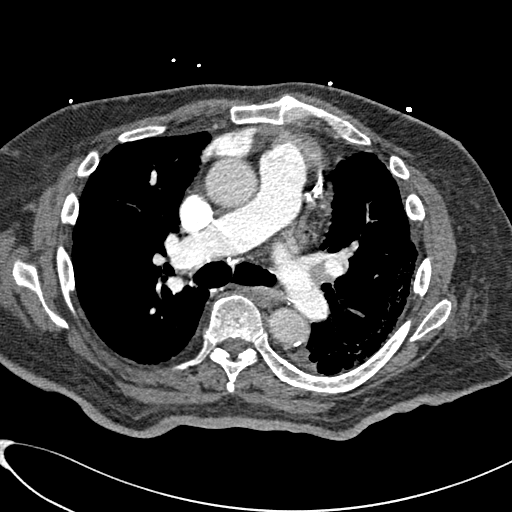
[im 190/311  lung]
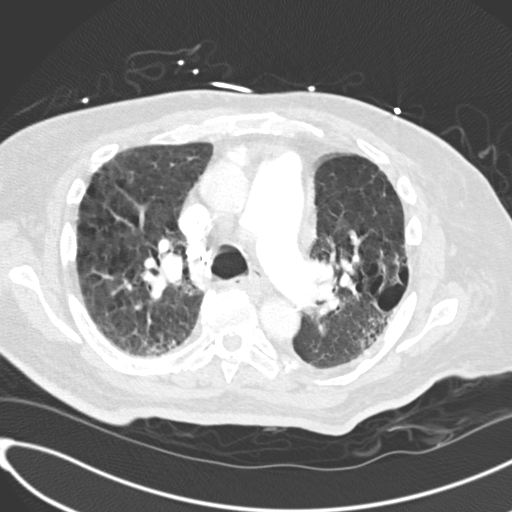
[im 207/311  soft-tissue]
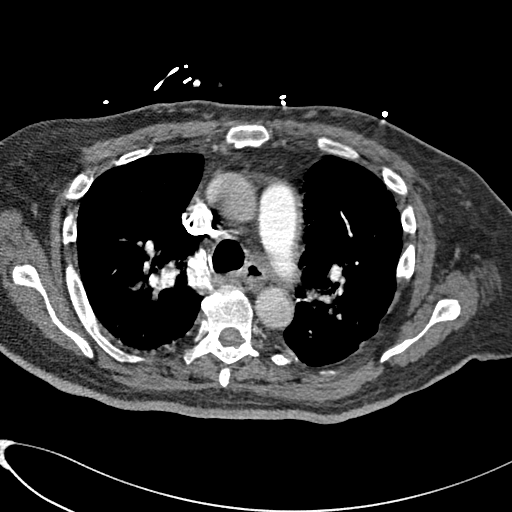
[im 224/311  lung]
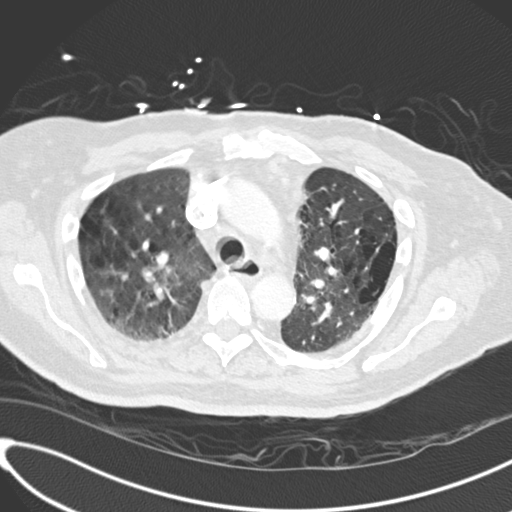
[im 242/311  soft-tissue]
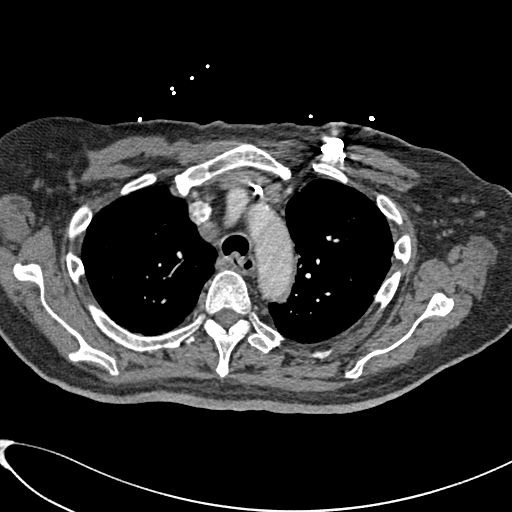
[im 276/311  lung]
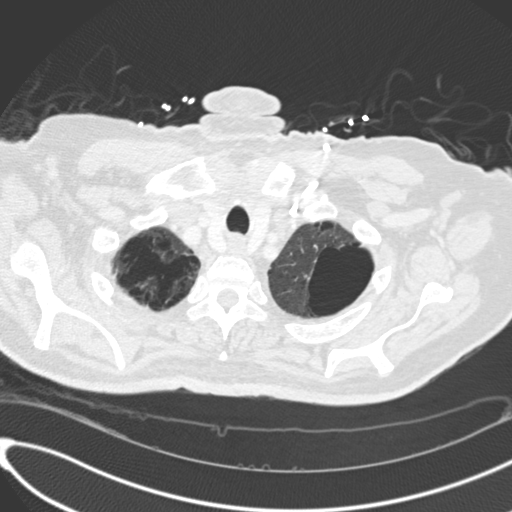
[im 293/311  soft-tissue]
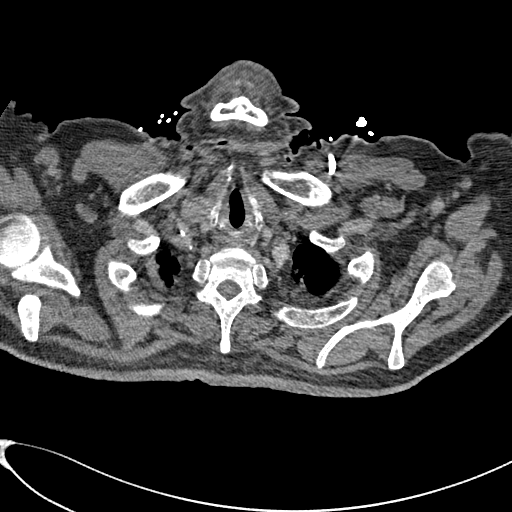

[17 of 46 positions shown; findings below may reference images not displayed]

FINDINGS: Mediastinum/Lymph Nodes: There is no demonstrable pulmonary embolus.
There is prominence of the main pulmonary arteries with rather rapid
peripheral tapering, suggesting a degree of pulmonary arterial
hypertension. There is prominence of the at ascending thoracic aorta
with a maximum transverse diameter of 4.0 x 3.9 cm. There is no
thoracic aortic dissection. There are scattered areas of
atherosclerotic change in the aorta and proximal great vessels.
There is left ventricular hypertrophy. The pericardium is not
appreciably thickened. There is scarring in the region of the left
lower lobe pulmonary vein.

Thyroid appears normal. There are small mediastinal lymph nodes
which are stable. There is a right peritracheal lymph node measuring
1.6 x 1.3 cm which is stable compared to the recent prior study.
Calcified sub- carinal lymph nodes suggest prior granulomatous
disease. There are multiple foci of coronary artery calcification.

Lungs/Pleura: There are bullae in the upper lobe regions, larger on
the left than on the right. There is fibrotic change in the lung
bases, more on left than on the right with traction type
bronchiectasis, stable. There is patchy airspace consolidation in
both lung bases, more on the right than on the left. Postoperative
changes noted in the left lower lobe region.

Upper abdomen: In the visualized upper abdomen, there is
calcification in the upper abdominal aorta and proximal superior
mesenteric artery. There are subcrural lymph nodes on the right,
largest measuring 1.6 x 1 6 cm. There is reflux of contrast into the
hepatic veins and inferior vena cava.

Musculoskeletal: There is degenerative change in the thoracic spine.
There are no blastic or lytic bone lesions.

Review of the MIP images confirms the above findings.
IMPRESSION: No demonstrable pulmonary embolus.

Prominence of ascending thoracic aorta with a maximum transverse
diameter 4.0 x 3.9 cm. Recommend annual imaging followup by CTA or
MRA. This recommendation follows 3999
ACCF/AHA/AATS/ACR/ASA/SCA/VH/REDUANUL/PRATTS/EVONNE Guidelines for the
Diagnosis and Management of Patients with Thoracic Aortic Disease.
Circulation. 3999; 121: [id number]

Evidence of underlying bullous emphysematous type change. There is
traction bronchiectasis and fibrosis in the left base. Suspect a
degree of underlying usual interstitial pneumonitis.

Postoperative change left base. Scarring lower lobe pulmonary vein
region on left.

Prominence of the central pulmonary arteries with rapid peripheral
tapering. Suspect underlying pulmonary arterial hypertension.

Left ventricular hypertrophy is noted. There are multiple foci of
coronary artery calcification.

Reflux of contrast into the inferior vena cava and hepatic veins is
suggestive of increased right heart pressure.

Mild right paratracheal and right subcrural adenopathy.
